# Patient Record
Sex: Female | Born: 1950 | Race: Black or African American | Hispanic: No | Smoking: Never smoker
Health system: Southern US, Community
[De-identification: ages and names within clinical notes are randomized; demographics above are authoritative.]

## PROBLEM LIST (undated history)

## (undated) DIAGNOSIS — E782 Mixed hyperlipidemia: Secondary | ICD-10-CM

## (undated) DIAGNOSIS — R011 Cardiac murmur, unspecified: Secondary | ICD-10-CM

## (undated) DIAGNOSIS — T7840XA Allergy, unspecified, initial encounter: Secondary | ICD-10-CM

## (undated) DIAGNOSIS — M810 Age-related osteoporosis without current pathological fracture: Secondary | ICD-10-CM

## (undated) DIAGNOSIS — I10 Essential (primary) hypertension: Secondary | ICD-10-CM

## (undated) DIAGNOSIS — D649 Anemia, unspecified: Secondary | ICD-10-CM

## (undated) DIAGNOSIS — R748 Abnormal levels of other serum enzymes: Secondary | ICD-10-CM

## (undated) DIAGNOSIS — M1990 Unspecified osteoarthritis, unspecified site: Secondary | ICD-10-CM

## (undated) DIAGNOSIS — R7303 Prediabetes: Secondary | ICD-10-CM

## (undated) DIAGNOSIS — E063 Autoimmune thyroiditis: Secondary | ICD-10-CM

## (undated) DIAGNOSIS — R801 Persistent proteinuria, unspecified: Secondary | ICD-10-CM

## (undated) DIAGNOSIS — M25561 Pain in right knee: Secondary | ICD-10-CM

## (undated) DIAGNOSIS — E039 Hypothyroidism, unspecified: Secondary | ICD-10-CM

## (undated) DIAGNOSIS — L309 Dermatitis, unspecified: Secondary | ICD-10-CM

## (undated) DIAGNOSIS — K219 Gastro-esophageal reflux disease without esophagitis: Secondary | ICD-10-CM

## (undated) DIAGNOSIS — R252 Cramp and spasm: Secondary | ICD-10-CM

## (undated) HISTORY — DX: Dermatitis, unspecified: L30.9

## (undated) HISTORY — DX: Unspecified osteoarthritis, unspecified site: M19.90

## (undated) HISTORY — DX: Abnormal levels of other serum enzymes: R74.8

## (undated) HISTORY — DX: Age-related osteoporosis without current pathological fracture: M81.0

## (undated) HISTORY — PX: EYE SURGERY: SHX253

## (undated) HISTORY — DX: Essential (primary) hypertension: I10

## (undated) HISTORY — DX: Cramp and spasm: R25.2

## (undated) HISTORY — DX: Allergy, unspecified, initial encounter: T78.40XA

---

## 2004-01-17 ENCOUNTER — Other Ambulatory Visit: Payer: Self-pay

## 2004-01-23 ENCOUNTER — Ambulatory Visit: Payer: Self-pay | Admitting: Orthopedic Surgery

## 2005-08-07 ENCOUNTER — Emergency Department: Payer: Self-pay | Admitting: Emergency Medicine

## 2005-10-22 ENCOUNTER — Encounter: Admission: RE | Admit: 2005-10-22 | Discharge: 2005-10-22 | Payer: Self-pay | Admitting: Family Medicine

## 2005-11-16 ENCOUNTER — Encounter: Payer: Self-pay | Admitting: Neurological Surgery

## 2005-12-16 ENCOUNTER — Encounter: Payer: Self-pay | Admitting: Neurological Surgery

## 2006-01-21 ENCOUNTER — Emergency Department: Payer: Self-pay | Admitting: Internal Medicine

## 2009-01-28 ENCOUNTER — Emergency Department: Payer: Self-pay | Admitting: Emergency Medicine

## 2009-10-06 ENCOUNTER — Ambulatory Visit: Payer: Self-pay

## 2011-02-16 HISTORY — PX: JOINT REPLACEMENT: SHX530

## 2011-07-20 ENCOUNTER — Emergency Department: Payer: Self-pay | Admitting: Emergency Medicine

## 2011-08-09 ENCOUNTER — Ambulatory Visit: Payer: Self-pay | Admitting: General Practice

## 2011-08-09 LAB — URINALYSIS, COMPLETE
Bacteria: NONE SEEN
Nitrite: NEGATIVE
Ph: 6 (ref 4.5–8.0)
Protein: NEGATIVE
RBC,UR: 1 /HPF (ref 0–5)
WBC UR: <1 /HPF (ref 0–5)

## 2011-08-09 LAB — BASIC METABOLIC PANEL
Anion Gap: 9 (ref 7–16)
Chloride: 100 mmol/L (ref 98–107)
Co2: 28 mmol/L (ref 21–32)
EGFR (African American): >60
EGFR (Non-African Amer.): >60
Glucose: 87 mg/dL (ref 65–99)
Sodium: 137 mmol/L (ref 136–145)

## 2011-08-09 LAB — CBC
HCT: 39 % (ref 35.0–47.0)
RBC: 4.36 10*6/uL (ref 3.80–5.20)
RDW: 13.4 % (ref 11.5–14.5)

## 2011-08-09 LAB — MRSA PCR SCREENING

## 2011-08-09 LAB — PROTIME-INR: INR: 0.9

## 2011-08-09 LAB — SEDIMENTATION RATE: Erythrocyte Sed Rate: 37 mm/hr — ABNORMAL HIGH (ref 0–30)

## 2011-08-10 LAB — URINE CULTURE

## 2011-08-25 ENCOUNTER — Inpatient Hospital Stay: Payer: Self-pay | Admitting: General Practice

## 2011-08-26 LAB — BASIC METABOLIC PANEL
BUN: 5 mg/dL — ABNORMAL LOW (ref 7–18)
Calcium, Total: 8 mg/dL — ABNORMAL LOW (ref 8.5–10.1)
EGFR (African American): >60
EGFR (Non-African Amer.): >60
Glucose: 125 mg/dL — ABNORMAL HIGH (ref 65–99)
Osmolality: 280 (ref 275–301)
Potassium: 2.9 mmol/L — ABNORMAL LOW (ref 3.5–5.1)
Sodium: 141 mmol/L (ref 136–145)

## 2011-08-26 LAB — PLATELET COUNT: Platelet: 376 x10 3/mm 3

## 2011-08-26 LAB — HEMOGLOBIN: HGB: 9.8 g/dL — ABNORMAL LOW

## 2011-08-27 ENCOUNTER — Encounter: Payer: Self-pay | Admitting: Internal Medicine

## 2011-08-27 LAB — BASIC METABOLIC PANEL
BUN: 5 mg/dL — ABNORMAL LOW (ref 7–18)
Calcium, Total: 8.2 mg/dL — ABNORMAL LOW (ref 8.5–10.1)
Chloride: 104 mmol/L (ref 98–107)
Co2: 30 mmol/L (ref 21–32)
Osmolality: 279 (ref 275–301)
Potassium: 3.4 mmol/L — ABNORMAL LOW (ref 3.5–5.1)

## 2011-08-28 LAB — BASIC METABOLIC PANEL
Calcium, Total: 8.2 mg/dL — ABNORMAL LOW (ref 8.5–10.1)
Co2: 32 mmol/L (ref 21–32)
EGFR (African American): >60
EGFR (Non-African Amer.): >60
Osmolality: 277 (ref 275–301)
Potassium: 3.8 mmol/L (ref 3.5–5.1)

## 2012-04-19 LAB — HM PAP SMEAR

## 2012-04-24 LAB — HM MAMMOGRAPHY

## 2012-05-25 ENCOUNTER — Ambulatory Visit: Payer: Self-pay

## 2012-11-23 ENCOUNTER — Emergency Department: Payer: Self-pay | Admitting: Emergency Medicine

## 2013-11-14 ENCOUNTER — Ambulatory Visit: Payer: Self-pay | Admitting: General Practice

## 2013-11-14 LAB — CBC
HCT: 39.7 % (ref 35.0–47.0)
HGB: 12.6 g/dL (ref 12.0–16.0)
MCH: 28.5 pg (ref 26.0–34.0)
MCHC: 31.6 g/dL — AB (ref 32.0–36.0)
MCV: 90 fL (ref 80–100)
PLATELETS: 519 10*3/uL — AB (ref 150–440)
RBC: 4.41 10*6/uL (ref 3.80–5.20)
RDW: 13.7 % (ref 11.5–14.5)
WBC: 4.6 10*3/uL (ref 3.6–11.0)

## 2013-11-14 LAB — URINALYSIS, COMPLETE
Bilirubin,UR: NEGATIVE
Glucose,UR: NEGATIVE mg/dL (ref 0–75)
Ketone: NEGATIVE
Nitrite: POSITIVE
PROTEIN: NEGATIVE
Ph: 7 (ref 4.5–8.0)
RBC,UR: 2 /HPF (ref 0–5)
SPECIFIC GRAVITY: 1.006 (ref 1.003–1.030)
Squamous Epithelial: 1

## 2013-11-14 LAB — BASIC METABOLIC PANEL
Anion Gap: 6 — ABNORMAL LOW (ref 7–16)
BUN: 13 mg/dL (ref 7–18)
CHLORIDE: 103 mmol/L (ref 98–107)
CO2: 31 mmol/L (ref 21–32)
CREATININE: 0.72 mg/dL (ref 0.60–1.30)
Calcium, Total: 9 mg/dL (ref 8.5–10.1)
EGFR (Non-African Amer.): >60
Glucose: 93 mg/dL (ref 65–99)
Osmolality: 279 (ref 275–301)
POTASSIUM: 3.6 mmol/L (ref 3.5–5.1)
SODIUM: 140 mmol/L (ref 136–145)

## 2013-11-14 LAB — SEDIMENTATION RATE: ERYTHROCYTE SED RATE: 32 mm/h — AB (ref 0–30)

## 2013-11-14 LAB — PROTIME-INR
INR: 1
PROTHROMBIN TIME: 12.7 s (ref 11.5–14.7)

## 2013-11-14 LAB — MRSA PCR SCREENING

## 2013-11-14 LAB — APTT: ACTIVATED PTT: 33.5 s (ref 23.6–35.9)

## 2013-11-16 LAB — URINE CULTURE

## 2013-11-28 ENCOUNTER — Inpatient Hospital Stay: Payer: Self-pay | Admitting: General Practice

## 2013-11-29 LAB — BASIC METABOLIC PANEL
Anion Gap: 5 — ABNORMAL LOW (ref 7–16)
BUN: 6 mg/dL — AB (ref 7–18)
CALCIUM: 8 mg/dL — AB (ref 8.5–10.1)
CHLORIDE: 105 mmol/L (ref 98–107)
CREATININE: 0.75 mg/dL (ref 0.60–1.30)
Co2: 30 mmol/L (ref 21–32)
EGFR (Non-African Amer.): >60
GLUCOSE: 102 mg/dL — AB (ref 65–99)
OSMOLALITY: 277 (ref 275–301)
Potassium: 3.9 mmol/L (ref 3.5–5.1)
Sodium: 140 mmol/L (ref 136–145)

## 2013-11-29 LAB — PLATELET COUNT: PLATELETS: 443 10*3/uL — AB (ref 150–440)

## 2013-11-29 LAB — HEMOGLOBIN: HGB: 10.3 g/dL — AB (ref 12.0–16.0)

## 2013-11-30 ENCOUNTER — Encounter: Payer: Self-pay | Admitting: Internal Medicine

## 2013-11-30 LAB — BASIC METABOLIC PANEL
Anion Gap: 6 — ABNORMAL LOW (ref 7–16)
Anion Gap: 7 (ref 7–16)
BUN: 4 mg/dL — AB (ref 7–18)
BUN: 5 mg/dL — AB (ref 7–18)
CALCIUM: 8 mg/dL — AB (ref 8.5–10.1)
CHLORIDE: 99 mmol/L (ref 98–107)
CO2: 30 mmol/L (ref 21–32)
CO2: 31 mmol/L (ref 21–32)
CREATININE: 0.59 mg/dL — AB (ref 0.60–1.30)
CREATININE: 0.69 mg/dL (ref 0.60–1.30)
Calcium, Total: 7.8 mg/dL — ABNORMAL LOW (ref 8.5–10.1)
Chloride: 99 mmol/L (ref 98–107)
EGFR (African American): >60
EGFR (African American): >60
Glucose: 106 mg/dL — ABNORMAL HIGH (ref 65–99)
Glucose: 129 mg/dL — ABNORMAL HIGH (ref 65–99)
OSMOLALITY: 273 (ref 275–301)
Osmolality: 267 (ref 275–301)
POTASSIUM: 2.9 mmol/L — AB (ref 3.5–5.1)
POTASSIUM: 2.9 mmol/L — AB (ref 3.5–5.1)
SODIUM: 135 mmol/L — AB (ref 136–145)
SODIUM: 137 mmol/L (ref 136–145)

## 2013-11-30 LAB — HEMOGLOBIN: HGB: 10.5 g/dL — AB (ref 12.0–16.0)

## 2013-11-30 LAB — PATHOLOGY REPORT

## 2013-11-30 LAB — PLATELET COUNT: Platelet: 445 10*3/uL — ABNORMAL HIGH (ref 150–440)

## 2013-12-01 LAB — BASIC METABOLIC PANEL
Anion Gap: 6 — ABNORMAL LOW (ref 7–16)
BUN: 8 mg/dL (ref 7–18)
CALCIUM: 7.8 mg/dL — AB (ref 8.5–10.1)
Chloride: 100 mmol/L (ref 98–107)
Co2: 31 mmol/L (ref 21–32)
Creatinine: 0.6 mg/dL (ref 0.60–1.30)
EGFR (African American): >60
EGFR (Non-African Amer.): >60
GLUCOSE: 94 mg/dL (ref 65–99)
OSMOLALITY: 272 (ref 275–301)
POTASSIUM: 4 mmol/L (ref 3.5–5.1)
SODIUM: 137 mmol/L (ref 136–145)

## 2013-12-16 ENCOUNTER — Encounter: Payer: Self-pay | Admitting: Internal Medicine

## 2013-12-20 ENCOUNTER — Emergency Department: Payer: Self-pay | Admitting: Internal Medicine

## 2013-12-20 LAB — CBC WITH DIFFERENTIAL/PLATELET
BASOS ABS: 0 10*3/uL (ref 0.0–0.1)
BASOS PCT: 0.2 %
Eosinophil #: 0 10*3/uL (ref 0.0–0.7)
Eosinophil %: 0.8 %
HCT: 35.4 % (ref 35.0–47.0)
HGB: 11.4 g/dL — AB (ref 12.0–16.0)
LYMPHS ABS: 1.5 10*3/uL (ref 1.0–3.6)
LYMPHS PCT: 25.1 %
MCH: 29 pg (ref 26.0–34.0)
MCHC: 32.2 g/dL (ref 32.0–36.0)
MCV: 90 fL (ref 80–100)
MONOS PCT: 9.1 %
Monocyte #: 0.5 x10 3/mm (ref 0.2–0.9)
Neutrophil #: 3.8 10*3/uL (ref 1.4–6.5)
Neutrophil %: 64.8 %
Platelet: 928 10*3/uL — ABNORMAL HIGH (ref 150–440)
RBC: 3.93 10*6/uL (ref 3.80–5.20)
RDW: 14.3 % (ref 11.5–14.5)
WBC: 5.9 10*3/uL (ref 3.6–11.0)

## 2013-12-20 LAB — URINALYSIS, COMPLETE
Bilirubin,UR: NEGATIVE
Blood: NEGATIVE
Glucose,UR: NEGATIVE mg/dL (ref 0–75)
KETONE: NEGATIVE
Nitrite: NEGATIVE
PROTEIN: NEGATIVE
Ph: 7 (ref 4.5–8.0)
RBC,UR: 2 /HPF (ref 0–5)
SPECIFIC GRAVITY: 1.003 (ref 1.003–1.030)

## 2013-12-20 LAB — COMPREHENSIVE METABOLIC PANEL
ANION GAP: 8 (ref 7–16)
Albumin: 3.4 g/dL (ref 3.4–5.0)
Alkaline Phosphatase: 370 U/L — ABNORMAL HIGH
BILIRUBIN TOTAL: 0.3 mg/dL (ref 0.2–1.0)
BUN: 8 mg/dL (ref 7–18)
CALCIUM: 9.4 mg/dL (ref 8.5–10.1)
CO2: 27 mmol/L (ref 21–32)
CREATININE: 0.72 mg/dL (ref 0.60–1.30)
Chloride: 102 mmol/L (ref 98–107)
EGFR (African American): >60
GLUCOSE: 126 mg/dL — AB (ref 65–99)
Osmolality: 274 (ref 275–301)
Potassium: 3.5 mmol/L (ref 3.5–5.1)
SGOT(AST): 33 U/L (ref 15–37)
SGPT (ALT): 20 U/L
Sodium: 137 mmol/L (ref 136–145)
Total Protein: 8.8 g/dL — ABNORMAL HIGH (ref 6.4–8.2)

## 2013-12-20 LAB — TROPONIN I

## 2014-01-15 ENCOUNTER — Encounter: Payer: Self-pay | Admitting: Internal Medicine

## 2014-02-15 ENCOUNTER — Encounter: Payer: Self-pay | Admitting: Internal Medicine

## 2014-06-08 NOTE — Op Note (Signed)
PATIENT NAME:  Carol [name], Carol [name] MR#:  [id number] DATE OF BIRTH:  [birth year]  DATE OF PROCEDURE:  11/28/2013  PREOPERATIVE DIAGNOSIS: Degenerative arthrosis of the left hip.   POSTOPERATIVE DIAGNOSIS: Degenerative arthrosis of the left hip.   PROCEDURE PERFORMED: Left total hip arthroplasty.   SURGEON: Francesco Sor, M.D.    ASSISTANT:  Van Clines, PA (required to maintain retraction throughout the procedure).   ANESTHESIA:  Spinal.   ESTIMATED BLOOD LOSS: 100 mL.   FLUIDS REPLACED: 1900 mL of crystalloid.   DRAINS: Two medium drains to Hemovac reservoir.   IMPLANTS UTILIZED:  DePuy 12-mm small-stature AML femoral stem, 52-mm outer diameter Pinnacle 100 acetabular shell,  +4-mm neutral Pinnacle Marathon polyethylene liner, and a 36-mm outer diameter M-SPEC femoral head with A +1.5-mm neck length.   INDICATIONS FOR SURGERY: The patient is a 63 year old female who has been seen for complaints of progressive left hip and groin pain as well as restricted range of motion of her left hip. X-rays demonstrated severe degenerative changes of the left hip. After discussion of the risks and benefits of surgical intervention, the patient expressed understanding of the risks and benefits, and agreed with plans for surgical intervention.   PROCEDURE IN DETAIL: The patient brought to the operating room and, after adequate spinal anesthesia was achieved, the patient was placed in a right lateral decubitus position. Axillary roll was placed, and all bony prominences were well padded. The patient's left hip and leg were cleaned and prepped with alcohol and DuraPrep and draped in the usual sterile fashion. A "timeout" was performed as per usual protocol.   A lateral curvilinear incision was made gently curving towards the posterior superior iliac spine. The IT band was incised in line with the skin incision, and fibers of the gluteus maximus were split in line. The piriformis tendon was identified, skeletonized,  incised at its insertion on the proximal femur and reflected posteriorly. In a similar fashion, short external rotators were incised and reflected posteriorly. A T-type posterior capsulotomy was performed.   Prominent posterior osteophytes were encountered. An osteotome was used to debride the posterior acetabular osteophytes. Prior to dislocation of the femoral head, a threaded Steinmann pin was inserted through a separate stab incision into the pelvis superior to the acetabulum and bent in the form of a stylus so as to assess limb length and hip offset throughout the procedure.   The femoral head was then dislocated posteriorly. Severe degenerative changes were noted with full-thickness loss of articular cartilage. Prominent osteophytes were noted about the femoral neck. These were debrided using a rongeur. The femoral neck cut was performed using an oscillating saw. The anterior capsule was elevated off of the femoral neck. Inspection of the acetabulum also demonstrated severe degenerative changes. Prominent periacetabular osteophytes were noted and these were debrided using osteotomes and a rongeur.   The acetabulum was reamed in a sequential fashion up to a 51-mm diameter. This allowed for good punctate bleeding bone. A 52-mm outer diameter Pinnacle 100 acetabular shell was positioned and impacted into place. Excellent scratch fit was appreciated. A +4 neutral polyethylene trial was inserted and attention was directed to the proximal femur. Pilot hole for reaming of the proximal femoral canal was created using a high-speed bur. Proximal femoral canal was reamed in a sequential fashion up to an 11.5-mm diameter. This allowed for approximately 6 cm of scratch fit. The proximal femur was prepared using a 12-mm aggressive side-biting reamer. Serial broaches were inserted up to a 12-mm small-stature  broach. The calcar region was planed and trial reduction was performed with a 36-mm hip ball with a +1.5-mm neck  length. This allowed for good equalization of limb length and excellent stability both anteriorly and posteriorly. Good hip offset was appreciated.   Trial components were removed. Acetabular shell was irrigated with copious amounts of normal saline with antibiotic solution and then suctioned dry. A +4-mm neutral Pinnacle Marathon polyethylene liner was positioned and impacted into place. Next, a 12-mm small-stature AML femoral component was positioned and impacted into place. Excellent scratch fit was appreciated. Trial reduction was again performed with a 36-mm hip ball with a +12.5-mm neck length. Again, excellent stability was noted both anteriorly and posteriorly and good equalization of limb lengths was appreciated.   Trial hip ball was removed. The Morse taper was cleaned and dried and a 36-mm M-SPEC femoral head with a +1.5-mm neck length was placed on the trunnion and impacted into place. The hip was reduced and again placed through a range of motion with excellent stability appreciated and good equalization of limb lengths.   The wound was irrigated with copious amounts of normal saline with antibiotic solution using pulsatile lavage and then suctioned dry. Good hemostasis was appreciated. The posterior capsulotomy was repaired using #5 Ethibond. The gluteal sling which had been released previously was reapproximated using interrupted sutures of #5 Ethibond. The piriformis tendon was reapproximated on the undersurface of the gluteus medius tendon using #5 Ethibond. Two medium drains were placed in the wound bed and brought out through a separate stab incision to be attached to a reinfusion system. The IT band was repaired using interrupted sutures of #1 Vicryl. The subcutaneous tissue was approximated in layers using first #0 Vicryl followed by 2-0 Vicryl. The skin was closed with skin staples. A sterile dressing was applied. The patient tolerated the procedure well. She was transported to the  recovery room in stable condition.    ____________________________ Illene LabradorJames P. Angie FavaHooten Jr., MD jph:lt D: 11/29/2013 06:22:05 ET T: 11/29/2013 12:22:40 ET JOB#: [id number]  cc: Illene LabradorJames P. Angie FavaHooten Jr., MD, <Dictator> JAMES P Angie FavaHOOTEN JR MD ELECTRONICALLY SIGNED 11/29/2013 23:02

## 2014-06-08 NOTE — Discharge Summary (Signed)
PATIENT NAME:  Carol [name], Carol [name] MR#:  [id number] DATE OF BIRTH:  1950-11-28  DATE OF ADMISSION:  11/28/2013 DATE OF DISCHARGE:  12/01/2013   DICTATED FOR: Illene LabradorJames P. Angie FavaHooten Jr., MD  ADMITTING DIAGNOSIS: Degenerative arthrosis of the left hip.   DISCHARGE DIAGNOSIS: Degenerative arthrosis of the left hip.   HISTORY: The patient is a 64 year old who has been [facility] at [facility] for progression of left hip discomfort. The patient states THAT she has seen significant increase in her pain to both the left hip and knee over the last 2 months, without any apparent trauma or aggravating event. She had gone to using a cane because of the increased discomfort. She had reported that her pain was aggravated with weight-bearing activities. She also reported some decrease in her range of motion. She was having difficulty with doffing and donning of her shoes and socks. The pain had increased to the point that it was significantly interfering with her activities of daily living.   X-rays taken in [facility] showed a significant loss of cartilage space to the left hip with bone-on-bone articulation being noted. There was significant osteophyte formation as well as subchondral sclerosis and subchondral cyst formation noted. After discussion of the risks and benefits of surgical intervention, the patient expressed her understanding of the risks and benefits and agreed for plans for surgical intervention.   PROCEDURE PERFORMED: Left total hip arthroplasty.   ANESTHESIA: Spinal.   IMPLANTS UTILIZED: DePuy 12 mm, small stature, AML femoral stem; 52 mm outer diameter Pinnacle 100 acetabular component shell; +4 mm neutral Pinnacle Marathon polyethylene liner, and a 36 mm outer diameter M-Spec femoral head with a +1.5 mm neck length.   HOSPITAL COURSE: The patient tolerated the procedure very well. She had no complications. She was then taken to the PACU where she was stabilized, and then transferred to the  orthopedic floor. She began receiving anticoagulation therapy of Lovenox 30 mg subcutaneous every 12 hours per anesthesia and pharmacy protocol. She was fitted with TED stockings bilaterally.  These were allowed to be removed 1 hour per 8-hour shift. She was also fitted with ABI compression flip-flops set at 80 mmHg. Her calves have been nontender. There has been no evidence of DVTs. Negative Homans sign. Heels were elevated off the bed using rolled towels.   The patient has denied any chest pain or shortness of breath. Vital signs have been stable. She has been afebrile. Hemodynamically she was stable. No transfusions were given.   Occupational therapy was also initiated on day 1 for ADL and assistive devices. She has done very well, but has been progressing very slow.  Physical therapy was also initiated on day 1 for ADL and assistive devices.   The patient's IV, Foley and Hemovac were discontinued on day 2, along with the dressing changes. The wound was free draining with no signs of infection.   DISPOSITION: The patient is being discharged to rehab facility in improved stable condition.  She may weight bear as tolerated. The posterior hip precautions were gone over once again. She is to elevate the lower extremity. Continue TED stockings; these may be removed 1 hour per 8-hour shift. Elevate the heels off the bed. Incentive spirometer every 1 hour while awake. Encourage cough, deep breathing every 2 hours while awake. She is placed on a regular diet.  She was instructed in wound care. She is not to get the wound wet until the staples are removed on 12/12/2013. She has a follow-up appointment  in the [facility] on 01/08/2014 at 9:45. She will call sooner if any complications.   DRUG ALLERGIES: No known drug allergies.   MEDICATIONS: Norvasc 5 mg q. a.m., Senokot-S 1 tablet [name].i.d., Flonase nasal spray 2 sprays to both nostrils p.r.n., Claritin 10 mg daily, pantoprazole 40 mg [name].i.d., Lovenox 30 mg  subcutaneous q. 12 hours for 14 days then discontinue and begin taking one 81 mg enteric-coated aspirin unless contraindicated,  Tylenol ES [phone] mg hours q. 4 hours p.r.n. for fevers, Mylanta DS 30 mL q. 6 hours, Dulcolax suppository 10 mg rectally daily p.r.n. for constipation  with no results from Milk of Magnesia, Milk of Magnesia 30 mL [name].i.d. p.r.n., Roxicodone 5-10 mg q. 4-6 hours p.r.n. for pain, tramadol 50-100 mg q. 4 hours for pain, enema soapsuds if no results with Milk of Magnesia or Dulcolax.   PAST MEDICAL HISTORY:  Hypertension and arthritis.      ____________________________ Van Clines, PA jrw:MT D: 11/30/2013 08:08:50 ET T: 11/30/2013 08:24:16 ET JOB#: [id number]  cc: Van Clines, PA, <Dictator> Josephus Harriger PA ELECTRONICALLY SIGNED 12/04/2013 12:37

## 2014-06-09 NOTE — Discharge Summary (Signed)
PATIENT NAME:  Carol [name], Carol [name] MR#:  [id number] DATE OF BIRTH:  August 22, 1950  DATE OF ADMISSION:  08/25/2011 DATE OF DISCHARGE:  08/28/2011  ADMITTING DIAGNOSIS: Degenerative arthrosis of the right hip.   DISCHARGE DIAGNOSES:  1. Degenerative arthrosis of the right hip.  2. Hypokalemia.   HISTORY: The patient is a 64 year old who has been followed at [facility] for progression of right hip pain. She had reported a several year history of progressive bilateral hip pain with the right being more symptomatic than the left. Her pain with relative, constant in nature, but was aggravated with weight-bearing activities as well as arising from a sitting position. At the time of surgery, she was not using any ambulatory aid. The patient had not seen any significant improvement despite the use of anti-inflammatory medications. The patient had noted progressive decrease in her range of motion of the hip. Her hip pain had progressed to the point that it was significantly interfering with her activities of daily living. [facility] in [facility] showed severe degenerative changes to both hips. She was noted to have subchondral sclerosis as well as osteophyte formation. Good mineralization of bone was noted. After discussion of the risks and benefits of surgical intervention, the patient expressed her understanding of the risks and benefits and agreed for plans for surgical intervention.   PROCEDURE: Right total hip arthroplasty.   ANESTHESIA: Spinal.   IMPLANTS UTILIZED: DePuy 13.5 mm small stature Prodigy femoral component, 52 mm outer diameter Pinnacle 100 acetabular component, a +4 mm neutral Pinnacle Marathon polyethylene liner, and a 36 mm M-Spec femoral head with a +1.5 mm neck length.   HOSPITAL COURSE: The patient tolerated the procedure very well. She had no complications. She was then taken to the PAC-U where she was stabilized and then transferred to the orthopedic  floor. She was fitted with TED stockings bilaterally. These were allowed to be removed one hour per eight hour shift. The patient was also fitted with the AV-I compression foot pumps bilaterally set at 80 mmHg. Her calves have been nontender. There has been no evidence of any deep venous thromboses of the lower extremity. Negative Homans sign. The heels were elevated off the bed using rolled towels.   The patient denied any chest pains or shortness of breath. Vital signs have been stable. She has been afebrile. Hemodynamically, she was stable. No transfusions were given. Her potassium, however, did drop to 2.9 on day one following surgery and subsequently this was supplemented with potassium. Upon being discharged, this was within normal limits.   The patient began receiving physical therapy on day one for gait training and transfers. This has been a little bit on the slow side for ambulation. This appears to be a little bit of a fear factor. The patient also began receiving occupational therapy for ADL and assistive devices.   The patient's IV, Foley and Hemovac were all discontinued on day two along with a dressing change. The wound was free of any drainage or signs of infection.   The patient is being discharged to a skilled nursing facility in improved stable condition. The patient will continue with physical therapy for gait training and transfers, occupational therapy for activities of daily living and assistive devices.   DISCHARGE INSTRUCTIONS:  1. Elevate heels off the bed.  2. Abduction pillow between legs when in bed.  3. Weight bear as tolerated.  4. TED stockings are to be worn around-the-clock but may be removed one hour  per eight hour shift.  5. Incentive spirometer q.1 hour while awake.  6. Encourage cough, deep breathing every two hours while awake.  7. She is placed on a regular diet.  8. She has a follow-up appointment on 08/22 at 10:45. She is to call the clinic sooner if any  temperatures of 101.5 or greater or excessive bleeding.   DRUG ALLERGIES: No known drug allergies.   MEDICATIONS:  1. Roxicodone 5 to 10 mg every 4 to 6 hours p.r.n. for pain. 2. Tramadol 100 mg every four hours p.r.n. for pain.  3. Senokot-S 1 tablet [name].i.d.  4. Milk of magnesia 30 mL [name].i.d.  5. Dulcolax suppositories 10 mg rectally daily p.r.n. for constipation.  6. Enema soapsuds if no results with milk of magnesia or Dulcolax.  7. Mylanta DS 30 mL every six hours p.r.n.  8. Pantoprazole 40 mg [name].i.d.  9. Flonase nasal spray two sprays both nostrils daily. 10. Claritin 10 mg daily. 11. Norvasc 5 mg daily.  12. Lovenox 30 mg subcutaneous q.12 hours for 14 days, then discontinue and begin taking one 81 mg enteric-coated aspirin.   PAST MEDICAL HISTORY:  1. Hypertension.  2. Arthritis.   ____________________________ Van Clines, PA jrw:ap D: 08/27/2011 08:12:08 ET T: 08/27/2011 08:49:43 ET JOB#: [id number]  cc: Van Clines, PA, <Dictator> Leann Mayweather PA ELECTRONICALLY SIGNED 08/29/2011 20:32

## 2014-06-09 NOTE — Op Note (Signed)
PATIENT NAME:  Carol [name], Carol [name] MR#:  [id number] DATE OF BIRTH:  03/17/1950  DATE OF PROCEDURE:  08/25/2011  PREOPERATIVE DIAGNOSIS: Degenerative arthrosis of the right hip.   POSTOPERATIVE DIAGNOSIS: Degenerative arthrosis of the right hip.   PROCEDURE PERFORMED: Right total hip arthroplasty.   SURGEON: Carol Labrador. Angie Fava., [name]    ASSISTANT: Van Clines, PA-C (required to maintain retraction throughout the procedure)   ANESTHESIA: Spinal.   ESTIMATED BLOOD LOSS: 250 mL.   FLUIDS REPLACED: 2100 mL of Crystalloid.   DRAINS: Two medium drains to Hemovac reservoir.   IMPLANTS UTILIZED: DePuy 13.5 mm small stature Prodigy femoral component, 52 mm outer diameter Pinnacle 100 acetabular component, a +4 mm neutral Pinnacle Marathon polyethylene liner, and a 36 mm M-Spec femoral head with a +1.5 mm neck length.   INDICATIONS FOR SURGERY: The patient is a 64 year old female who has been seen for complaints of progressive right hip and groin pain as well as restricted range of motion of the hip. X-rays demonstrated severe degenerative changes with bone on bone articulation appreciated especially superiorly. After discussion of the risks and benefits of surgical intervention, the patient expressed her understanding of the risks and benefits and agreed with plans for surgical intervention.   PROCEDURE IN DETAIL: The patient was brought to the operating room and, after adequate spinal anesthesia was achieved, the patient was placed in left lateral decubitus position. Axillary roll was placed and all bony prominences were well padded. The patient's right hip and leg were cleaned and prepped with alcohol and DuraPrep and draped in the usual sterile fashion. A "time-out" was performed as per usual protocol. A lateral curvilinear incision was made gently curving towards the posterior superior iliac spine. IT band was incised in line with the skin incision and the fibers of the gluteus maximus were split in  line. Piriformis tendon was identified, skeletonized, and incised at its insertion and the proximal femur and reflected posteriorly. The short external rotators were incised and reflected posteriorly. A T-type posterior capsulotomy was performed. Prior to dislocation of the femoral head, a threaded Steinmann pin was inserted through a separate stab incision into the pelvis superior to the acetabulum and bent in the form of a stylus so as to assess limb length and hip offset throughout the procedure. Femoral head was then dislocated posteriorly. Severe degenerative changes were noted with full thickness loss of articular cartilage and prominent osteophyte formation about the femoral neck. Femoral neck cut was performed using an oscillating saw. Anterior capsule was elevated off of the femoral neck. Inspection of the acetabulum also demonstrated severe degenerative changes with near circumferential osteophyte formation. Remnant of the labrum was excised. The acetabulum was reamed in a sequential fashion up to a 51 mm diameter. Good punctate bleeding bone was encountered. A 52 mm Pinnacle 100 acetabular component was positioned and impacted into place. Excellent scratch fit was appreciated. A +4 mm neutral polyethylene trial was inserted. Prominent margins of the acetabular osteophytes were debrided using a rongeur.   Attention was then directed to the proximal femur. Pilot hole for reaming of the proximal femoral canal was created using high-speed bur. Proximal femoral canal was reamed in a sequential fashion up to a 13 mm diameter. This allowed for approximately 6 cm of scratch fit. The proximal femur was then prepared using a 13.5 mm aggressive side-biting reamer. Serial broaches were inserted up to a 13.5 mm small stature broach. Calcar region was planed and trial reduction was performed using first an  AML neck length with +1.5 mm length. Good equalization of limb lengths was appreciated. However, there appeared  to be some relative increase in the hip offset. The AML neck segment was then replaced with a Prodigy neck segment with the 1.5 mm neck length. This allowed for good equalization of limb lengths and more appropriate hip offset. Excellent stability was appreciated both anteriorly and posteriorly. Trial components were removed. The acetabular shell was irrigated with normal saline with antibiotic solution and then suctioned dry. A +4 mm neutral Pinnacle Marathon polyethylene liner was positioned and impacted into place. Next, a 13.5 mm small stature Prodigy femoral component was positioned and impacted into place. Excellent scratch fit was appreciated. Trial reduction was again performed with a 36 mm hip ball with a +1.5 mm neck length. Again, good equalization of limb lengths was appreciated and appropriate hip offset was noted. Excellent stability was appreciated both anteriorly and posteriorly. Trial hip ball was removed. The Morse taper was cleaned and dried. A 36 mm M-Spec femoral head with a +1.5 mm neck length was placed on the trunnion and impacted into place. The hip was reduced and placed through a range of motion. Again, excellent anterior and posterior stability was appreciated with good equalization of limb lengths and appropriate restoration of hip offset noted.   The wound was irrigated with copious amounts of normal saline with antibiotic solution using pulsatile lavage and then suctioned dry. Good hemostasis was appreciated. The posterior capsulotomy was repaired using #5 Ethibond. Piriformis tendon was reapproximated on the undersurface of the gluteus medius tendon using #5 Ethibond. Two medium drains were placed in the wound bed and brought out through a separate stab incision to be attached to Hemovac reservoir. IT band was repaired using interrupted sutures of #1 Vicryl. Subcutaneous tissue was approximated in layers using first #0 Vicryl followed by #2-0 Vicryl. Skin was closed with skin  staples. Sterile dressing was applied.   The patient tolerated the procedure well. She was transported to the recovery room in stable condition.   ____________________________ Carol LabradorJames P. Angie FavaHooten Jr., [name] jph:drc D: 08/26/2011 06:21:00 ET T: 08/26/2011 09:33:48 ET JOB#: [id number]  cc: Carol LabradorJames P. Angie FavaHooten Jr., [name], <Dictator> Carol [name] ELECTRONICALLY SIGNED 08/28/2011 15:30

## 2014-08-23 ENCOUNTER — Other Ambulatory Visit: Payer: Self-pay | Admitting: Unknown Physician Specialty

## 2014-09-27 ENCOUNTER — Other Ambulatory Visit: Payer: Self-pay | Admitting: Family Medicine

## 2014-09-27 NOTE — Telephone Encounter (Signed)
Routine to primary provider 

## 2014-12-02 DIAGNOSIS — Z96649 Presence of unspecified artificial hip joint: Secondary | ICD-10-CM | POA: Insufficient documentation

## 2014-12-03 ENCOUNTER — Telehealth: Payer: Self-pay

## 2014-12-03 NOTE — Telephone Encounter (Signed)
Called patient and asked her if she's had an eye exam and her colorectal screening. Patient told me that she knew she needed an eye exam and she will schedule one soon. She also told me she completed the Cologuard screening and we should get the results soon.

## 2014-12-18 DIAGNOSIS — M810 Age-related osteoporosis without current pathological fracture: Secondary | ICD-10-CM

## 2014-12-18 HISTORY — DX: Age-related osteoporosis without current pathological fracture: M81.0

## 2015-01-13 DIAGNOSIS — M169 Osteoarthritis of hip, unspecified: Secondary | ICD-10-CM | POA: Insufficient documentation

## 2015-01-13 DIAGNOSIS — M810 Age-related osteoporosis without current pathological fracture: Secondary | ICD-10-CM | POA: Insufficient documentation

## 2015-01-13 DIAGNOSIS — L309 Dermatitis, unspecified: Secondary | ICD-10-CM

## 2015-01-13 DIAGNOSIS — R252 Cramp and spasm: Secondary | ICD-10-CM

## 2015-01-13 DIAGNOSIS — M1990 Unspecified osteoarthritis, unspecified site: Secondary | ICD-10-CM

## 2015-01-13 DIAGNOSIS — I10 Essential (primary) hypertension: Secondary | ICD-10-CM

## 2015-01-13 DIAGNOSIS — J309 Allergic rhinitis, unspecified: Secondary | ICD-10-CM | POA: Insufficient documentation

## 2015-01-13 DIAGNOSIS — R748 Abnormal levels of other serum enzymes: Secondary | ICD-10-CM | POA: Insufficient documentation

## 2015-01-27 ENCOUNTER — Ambulatory Visit (INDEPENDENT_AMBULATORY_CARE_PROVIDER_SITE_OTHER): Payer: Medicare Other | Admitting: Unknown Physician Specialty

## 2015-01-27 ENCOUNTER — Encounter: Payer: Self-pay | Admitting: Unknown Physician Specialty

## 2015-01-27 VITALS — BP 136/81 | HR 86 | Temp 98.3°F | Ht 63.1 in | Wt 160.0 lb

## 2015-01-27 DIAGNOSIS — I10 Essential (primary) hypertension: Secondary | ICD-10-CM

## 2015-01-27 DIAGNOSIS — Z0000 Encounter for general adult medical examination without abnormal findings: Secondary | ICD-10-CM

## 2015-01-27 DIAGNOSIS — Z23 Encounter for immunization: Secondary | ICD-10-CM | POA: Diagnosis not present

## 2015-01-27 LAB — MICROALBUMIN, URINE WAIVED
CREATININE, URINE WAIVED: 100 mg/dL (ref 10–300)
MICROALB, UR WAIVED: 30 mg/L — AB (ref 0–19)

## 2015-01-27 MED ORDER — AMLODIPINE BESYLATE 5 MG PO TABS: 5.0000 mg | ORAL_TABLET | Freq: Every day | ORAL | Status: DC

## 2015-01-27 NOTE — Patient Instructions (Signed)
Please do call to schedule your mammogram; the number to schedule one at either [facility] or [facility] is [phone]   

## 2015-01-27 NOTE — Assessment & Plan Note (Signed)
Stable, continue present medications.   

## 2015-01-27 NOTE — Progress Notes (Signed)
BP 136/81 mmHg  Pulse 86  Temp(Src) 98.3 F (36.8 C)  Ht 5' 3.1" (1.603 m)  Wt 160 lb (72.576 kg)  BMI 28.24 kg/m2  SpO2 97%  LMP  (LMP Unknown)   Subjective:    Patient ID: Carol [name], female    DOB: 06/20/1950, 64 y.o.   MRN: [id number]  HPI: Carol [name] is a 64 y.o. female  Chief Complaint  Patient presents with  . Medicare Wellness  . Cough    pt states she has a cough that has been going on for about a week now   Functional Status Survey: Is the patient deaf or have difficulty hearing?: No Does the patient have difficulty seeing, even when wearing glasses/contacts?: No Does the patient have difficulty concentrating, remembering, or making decisions?: No Does the patient have difficulty walking or climbing stairs?: No Does the patient have difficulty dressing or bathing?: No Does the patient have difficulty doing errands alone such as visiting a doctor's office or shopping?: No  Depression screen PHQ 2/9 01/27/2015  Decreased Interest 0  Down, Depressed, Hopeless 0  PHQ - 2 Score 0    Fall Risk  01/27/2015  Falls in the past year? No   Hypertension Using medications without difficulty Average home BP: Not checking  No problems or lightheadedness No chest pain with exertion or shortness of breath No Edema  Relevant past medical, surgical, family and social history reviewed and updated as indicated. Interim medical history since our last visit reviewed. Allergies and medications reviewed and updated.  See functional status, depression screen, and fall's risk assessment  under the appropriate section.    Pt is able to perform complex mental tasks, recognize clock face, recognize time and do a 3 item recall.    Review of Systems  Constitutional: Negative.   HENT: Negative.   Eyes: Negative.   Respiratory: Positive for cough.   Cardiovascular: Negative.   Gastrointestinal: Negative.   Endocrine: Negative.   Genitourinary: Negative.    Musculoskeletal: Negative.   Skin: Negative.   Allergic/Immunologic: Negative.   Neurological: Negative.   Hematological: Negative.   Psychiatric/Behavioral: Negative.     Per HPI unless specifically indicated above     Objective:    BP 136/81 mmHg  Pulse 86  Temp(Src) 98.3 F (36.8 C)  Ht 5' 3.1" (1.603 m)  Wt 160 lb (72.576 kg)  BMI 28.24 kg/m2  SpO2 97%  LMP  (LMP Unknown)  Wt Readings from Last 3 Encounters:  01/27/15 160 lb (72.576 kg)  07/16/14 157 lb (71.215 kg)    Physical Exam  Constitutional: She is oriented to person, place, and time. She appears well-developed and well-nourished.  HENT:  Head: Normocephalic and atraumatic.  Eyes: Pupils are equal, round, and reactive to light. Right eye exhibits no discharge. Left eye exhibits no discharge. No scleral icterus.  Neck: Normal range of motion. Neck supple. Carotid bruit is not present. No thyromegaly present.  Cardiovascular: Normal rate, regular rhythm and normal heart sounds.  Exam reveals no gallop and no friction rub.   No murmur heard. Pulmonary/Chest: Effort normal and breath sounds normal. No respiratory distress. She has no wheezes. She has no rales.  Abdominal: Soft. Bowel sounds are normal. There is no tenderness. There is no rebound.  Genitourinary: No breast swelling, tenderness or discharge.  Musculoskeletal: Normal range of motion.  Lymphadenopathy:    She has no cervical adenopathy.  Neurological: She is alert and oriented to person, place, and time.  Skin: Skin is warm, dry and intact. No rash noted.  Psychiatric: She has a normal mood and affect. Her speech is normal and behavior is normal. Judgment and thought content normal. Cognition and memory are normal.      Assessment & Plan:   Problem List Items Addressed This Visit      Unprioritized   Hypertension    Stable, continue present medications.       Relevant Medications   amLODipine (NORVASC) 5 MG tablet   Other Relevant Orders    Comprehensive metabolic panel   Microalbumin, Urine Waived   Lipid Panel w/o Chol/HDL Ratio    Other Visit Diagnoses    Annual physical exam    -  Primary    Relevant Orders    HIV antibody    Hepatitis C antibody    MM DIGITAL SCREENING BILATERAL    Immunization due        Relevant Orders    Flu Vaccine QUAD 36+ mos IM        Follow up plan: Return in about 6 months (around 07/28/2015).

## 2015-01-28 ENCOUNTER — Encounter: Payer: Self-pay | Admitting: Unknown Physician Specialty

## 2015-01-28 LAB — COMPREHENSIVE METABOLIC PANEL
ALBUMIN: 4 g/dL (ref 3.6–4.8)
ALT: 12 IU/L (ref 0–32)
AST: 15 IU/L (ref 0–40)
Albumin/Globulin Ratio: 1.1 (ref 1.1–2.5)
Alkaline Phosphatase: 157 IU/L — ABNORMAL HIGH (ref 39–117)
BUN / CREAT RATIO: 14 (ref 11–26)
BUN: 11 mg/dL (ref 8–27)
Bilirubin Total: 0.3 mg/dL (ref 0.0–1.2)
CO2: 24 mmol/L (ref 18–29)
CREATININE: 0.8 mg/dL (ref 0.57–1.00)
Calcium: 9.3 mg/dL (ref 8.7–10.3)
Chloride: 99 mmol/L (ref 96–106)
GFR calc non Af Amer: 79 mL/min/{1.73_m2} (ref >59)
GFR, EST AFRICAN AMERICAN: 91 mL/min/{1.73_m2} (ref >59)
GLUCOSE: 88 mg/dL (ref 65–99)
Globulin, Total: 3.8 g/dL (ref 1.5–4.5)
Potassium: 4 mmol/L (ref 3.5–5.2)
Sodium: 139 mmol/L (ref 134–144)
TOTAL PROTEIN: 7.8 g/dL (ref 6.0–8.5)

## 2015-01-28 LAB — LIPID PANEL W/O CHOL/HDL RATIO
CHOLESTEROL TOTAL: 192 mg/dL (ref 100–199)
HDL: 37 mg/dL — ABNORMAL LOW (ref >39)
LDL CALC: 118 mg/dL — AB (ref 0–99)
Triglycerides: 184 mg/dL — ABNORMAL HIGH (ref 0–149)
VLDL CHOLESTEROL CAL: 37 mg/dL (ref 5–40)

## 2015-01-28 LAB — HIV ANTIBODY (ROUTINE TESTING W REFLEX): HIV Screen 4th Generation wRfx: NONREACTIVE

## 2015-01-28 LAB — HEPATITIS C ANTIBODY: Hep C Virus Ab: <0.1 s/co ratio (ref 0.0–0.9)

## 2015-01-28 NOTE — Progress Notes (Signed)
Quick Note:  Normal labs. Patient notified by letter. ______ 

## 2015-01-31 ENCOUNTER — Ambulatory Visit
Admission: RE | Admit: 2015-01-31 | Discharge: 2015-01-31 | Disposition: A | Discharge status: Home / Self Care | Payer: Medicare Other | Source: Ambulatory Visit | Attending: Unknown Physician Specialty | Admitting: Unknown Physician Specialty

## 2015-01-31 ENCOUNTER — Other Ambulatory Visit: Payer: Self-pay | Admitting: Unknown Physician Specialty

## 2015-01-31 DIAGNOSIS — Z0000 Encounter for general adult medical examination without abnormal findings: Secondary | ICD-10-CM

## 2015-01-31 DIAGNOSIS — Z1231 Encounter for screening mammogram for malignant neoplasm of breast: Secondary | ICD-10-CM | POA: Diagnosis not present

## 2015-02-11 ENCOUNTER — Ambulatory Visit (INDEPENDENT_AMBULATORY_CARE_PROVIDER_SITE_OTHER): Payer: Medicare Other | Admitting: Family Medicine

## 2015-02-11 ENCOUNTER — Ambulatory Visit: Payer: Medicare Other | Admitting: Family Medicine

## 2015-02-11 VITALS — BP 126/77 | HR 92 | Temp 97.6°F | Resp 16 | Ht 63.5 in | Wt 161.0 lb

## 2015-02-11 DIAGNOSIS — J069 Acute upper respiratory infection, unspecified: Secondary | ICD-10-CM

## 2015-02-11 DIAGNOSIS — J039 Acute tonsillitis, unspecified: Secondary | ICD-10-CM | POA: Diagnosis not present

## 2015-02-11 LAB — POCT RAPID STREP A (OFFICE): Rapid Strep A Screen: NEGATIVE

## 2015-02-11 MED ORDER — BENZONATATE 100 MG PO CAPS: 100.0000 mg | ORAL_CAPSULE | Freq: Three times a day (TID) | ORAL | Status: DC | PRN

## 2015-02-11 MED ORDER — PENICILLIN V POTASSIUM 250 MG/5ML PO SOLR: 500.0000 mg | Freq: Three times a day (TID) | ORAL | Status: AC

## 2015-02-11 MED ORDER — OXYMETAZOLINE HCL 0.05 % NA SOLN: 1.0000 | Freq: Two times a day (BID) | NASAL | Status: AC

## 2015-02-11 MED ORDER — DEXTROMETHORPHAN POLISTIREX ER 30 MG/5ML PO SUER: 30.0000 mg | Freq: Two times a day (BID) | ORAL | Status: DC

## 2015-02-11 NOTE — Patient Instructions (Signed)
You can use supportive care at home to help with your symptoms. You can takes this twice daily.  I have also sent tesslon perles to your pharmacy to help with the cough- you can take these 3 times daily as needed. Honey is a natural cough suppressant- so add it to your tea in the morning.  If you have a humidifer, set that up in your bedroom at night.   Take your antibiotic 3 times per day for 10 days.  Salt water gargles for sore throat.   You can get Delsym syrup OTC to help with your cough.

## 2015-02-11 NOTE — Progress Notes (Signed)
Subjective:    Patient ID: Carol [name], female    DOB: 08-Aug-1950, 64 y.o.   MRN: [id number]  HPI: Carol [name] is a 64 y.o. female presenting on 02/11/2015 for Cough   Cough This is a new problem. The current episode started in the past 7 days. The cough is non-productive. Associated symptoms include nasal congestion and a sore throat. Pertinent negatives include no chest pain, chills, ear pain, fever, headaches, shortness of breath or wheezing. She has tried OTC cough suppressant for the symptoms. The treatment provided mild relief.    Pt is a [facility] patient being seen for an acute care visit today. Cough, sore throat- starting 2 days ago. No fevers. Headache at home. No ear pain, facial pressure. No chest pain or tightness, SOB.  No sick contacts.  Home treatment: Took robitussin DM.   Past Medical History  Diagnosis Date  . Muscle cramps   . Osteoporosis   . Hypertension   . Osteoarthritis   . Allergy   . Alkaline phosphatase elevation   . Eczema    Social History   Social History  . Marital Status: Married    Spouse Name: N/A  . Number of Children: N/A  . Years of Education: N/A   Occupational History  . Not on file.   Social History Main Topics  . Smoking status: Never Smoker   . Smokeless tobacco: Never Used  . Alcohol Use: No  . Drug Use: No  . Sexual Activity: Yes   Other Topics Concern  . Not on file   Social History Narrative   Family History  Problem Relation Age of Onset  . Cancer Mother   . CAD Mother   . Hypertension Mother   . Cancer Father     throat and lung  . Breast cancer Neg Hx    Current Outpatient Prescriptions on File Prior to Visit  Medication Sig  . amLODipine (NORVASC) 5 MG tablet Take 1 tablet (5 mg total) by mouth daily.  [name] aspirin 81 MG tablet Take 81 mg by mouth daily.  . fluticasone (FLONASE) 50 MCG/ACT nasal spray Place 2 sprays into both nostrils daily.  [name] loratadine (CLARITIN) 10 MG tablet Take 10 mg by  mouth daily.   No current facility-administered medications on file prior to visit.    Review of Systems  Constitutional: Negative for fever and chills.  HENT: Positive for congestion, sinus pressure and sore throat. Negative for ear pain and sneezing.   Respiratory: Positive for cough. Negative for chest tightness, shortness of breath and wheezing.   Cardiovascular: Negative for chest pain, palpitations and leg swelling.  Gastrointestinal: Negative.  Negative for nausea, vomiting, abdominal pain, diarrhea and constipation.  Endocrine: Negative.  Negative for cold intolerance, heat intolerance, polydipsia, polyphagia and polyuria.  Genitourinary: Negative for dysuria and difficulty urinating.  Musculoskeletal: Negative.  Negative for neck pain and neck stiffness.  Neurological: Negative for dizziness, light-headedness, numbness and headaches.  Psychiatric/Behavioral: Negative.    Per HPI unless specifically indicated above     Objective:    BP 126/77 mmHg  Pulse 92  Temp(Src) 97.6 F (36.4 C) (Oral)  Resp 16  Ht 5' 3.5" (1.613 m)  Wt 161 lb (73.029 kg)  BMI 28.07 kg/m2  SpO2 96%  LMP  (LMP Unknown)  Wt Readings from Last 3 Encounters:  02/11/15 161 lb (73.029 kg)  01/27/15 160 lb (72.576 kg)  07/16/14 157 lb (71.215 kg)    Physical Exam  Constitutional: She is oriented to person, place, and time. She appears well-developed and well-nourished.  HENT:  Head: Normocephalic and atraumatic.  Right Ear: Hearing and tympanic membrane normal.  Left Ear: Hearing and tympanic membrane normal.  Nose: Mucosal edema and rhinorrhea present. Right sinus exhibits no maxillary sinus tenderness and no frontal sinus tenderness. Left sinus exhibits no maxillary sinus tenderness and no frontal sinus tenderness.  Mouth/Throat: Oropharyngeal exudate present.    Neck: Neck supple. No Brudzinski's sign and no Kernig's sign noted.  Cardiovascular: Normal rate, regular rhythm and normal heart  sounds.  Exam reveals no gallop and no friction rub.   No murmur heard. Pulmonary/Chest: Effort normal and breath sounds normal. She has no wheezes. She exhibits no tenderness.  Abdominal: Soft. Normal appearance and bowel sounds are normal. She exhibits no distension and no mass. There is no tenderness. There is no rebound and no guarding.  Musculoskeletal: Normal range of motion. She exhibits no edema or tenderness.  Lymphadenopathy:    She has no cervical adenopathy.  Neurological: She is alert and oriented to person, place, and time.  Skin: Skin is warm and dry.   Results for orders placed or performed in visit on 02/11/15  POCT rapid strep A  Result Value Ref Range   Rapid Strep A Screen Negative Negative      Assessment & Plan:   Problem List Items Addressed This Visit    None    Visit Diagnoses    Exudative tonsillitis    -  Primary    Treat with antibiotic given exudate, beefy red tonsils and +AC nodes. Throat culture. PCN TID for 10 days. Supportive care reviewed. Alarm symptoms reviewed.     Relevant Medications    penicillin v potassium (VEETID) 250 MG/5ML solution    Other Relevant Orders    POCT rapid strep A (Completed)    Culture, Group A Strep    Upper respiratory infection        Supportive care. Cough medications given. Alarm symptoms reviewed. Encouraged use of Afrin for congestion considering patients history of hypertension.        Meds ordered this encounter  Medications  . penicillin v potassium (VEETID) 250 MG/5ML solution    Sig: Take 10 mLs (500 mg total) by mouth 3 (three) times daily.    Dispense:  100 mL    Refill:  0    Order Specific Question:  Supervising Provider    Answer:  Janeann Forehand [[id number]]  . oxymetazoline (AFRIN NASAL SPRAY) 0.05 % nasal spray    Sig: Place 1 spray into both nostrils 2 (two) times daily. Use for 3 days only.    Dispense:  30 mL    Refill:  0    Order Specific Question:  Supervising Provider    Answer:   Janeann Forehand [[id number]]  . benzonatate (TESSALON) 100 MG capsule    Sig: Take 1 capsule (100 mg total) by mouth 3 (three) times daily as needed.    Dispense:  30 capsule    Refill:  0    Order Specific Question:  Supervising Provider    Answer:  Janeann Forehand [[id number]]  . dextromethorphan (DELSYM) 30 MG/5ML liquid    Sig: Take 5 mLs (30 mg total) by mouth 2 (two) times daily.    Dispense:  89 mL    Refill:  0    Order Specific Question:  Supervising Provider    Answer:  Fidel Levy  H [[id number]]      Follow up plan: Return if symptoms worsen or fail to improve.

## 2015-03-10 ENCOUNTER — Encounter: Payer: Self-pay | Admitting: Unknown Physician Specialty

## 2015-03-10 ENCOUNTER — Telehealth: Payer: Self-pay | Admitting: Unknown Physician Specialty

## 2015-03-10 ENCOUNTER — Ambulatory Visit (INDEPENDENT_AMBULATORY_CARE_PROVIDER_SITE_OTHER): Payer: Medicare Other | Admitting: Unknown Physician Specialty

## 2015-03-10 ENCOUNTER — Ambulatory Visit
Admission: RE | Admit: 2015-03-10 | Discharge: 2015-03-10 | Disposition: A | Discharge status: Home / Self Care | Payer: Medicare Other | Source: Ambulatory Visit | Attending: Unknown Physician Specialty | Admitting: Unknown Physician Specialty

## 2015-03-10 VITALS — BP 152/88 | HR 76 | Temp 97.5°F | Ht 62.7 in | Wt 159.0 lb

## 2015-03-10 DIAGNOSIS — R059 Cough, unspecified: Secondary | ICD-10-CM

## 2015-03-10 DIAGNOSIS — R05 Cough: Secondary | ICD-10-CM | POA: Diagnosis not present

## 2015-03-10 MED ORDER — DOXYCYCLINE CALCIUM 50 MG/5ML PO SYRP: 100.0000 mg | ORAL_SOLUTION | Freq: Two times a day (BID) | ORAL | Status: DC

## 2015-03-10 NOTE — Telephone Encounter (Signed)
That is OK if she can swallow the pills

## 2015-03-10 NOTE — Telephone Encounter (Signed)
Called and let the pharmacy know that it was ok for the patient to take the pills instead of the liquid.

## 2015-03-10 NOTE — Progress Notes (Signed)
BP 152/88 mmHg  Pulse 76  Temp(Src) 97.5 F (36.4 C)  Ht 5' 2.7" (1.593 m)  Wt 159 lb (72.122 kg)  BMI 28.42 kg/m2  SpO2 96%  LMP  (LMP Unknown)   Subjective:    Patient ID: Carol [name], female    DOB: [birth year], 65 y.o.   MRN: [id number]  HPI: Carol [name] is a 65 y.o. female  Chief Complaint  Patient presents with  . Cough    pt states she has had a cough for about 2 to 3 weeks now, was given tessalon 100 mg but states it makes her tired   Cough  Pt is complaining of cough she has had since before December 12th when I saw her for a physical.  She was seen on 12/27 at [facility] and those notes were reviewed.   She was given PenV and Tessalon.  She finds the Kimberlee Nearing makes her tired.  Currently her cough is dry and it makes her chest hurt.  She has never smoked but her husband smokes in the house.  No chest x-ray was done.  Pt states it gets worse with gettting hot  Relevant past medical, surgical, family and social history reviewed and updated as indicated. Interim medical history since our last visit reviewed. Allergies and medications reviewed and updated.  Review of Systems  Per HPI unless specifically indicated above     Objective:    BP 152/88 mmHg  Pulse 76  Temp(Src) 97.5 F (36.4 C)  Ht 5' 2.7" (1.593 m)  Wt 159 lb (72.122 kg)  BMI 28.42 kg/m2  SpO2 96%  LMP  (LMP Unknown)  Wt Readings from Last 3 Encounters:  03/10/15 159 lb (72.122 kg)  02/11/15 161 lb (73.029 kg)  01/27/15 160 lb (72.576 kg)    Physical Exam  Constitutional: She is oriented to person, place, and time. She appears well-developed and well-nourished. No distress.  HENT:  Head: Normocephalic and atraumatic.  Right Ear: Tympanic membrane and ear canal normal.  Left Ear: Tympanic membrane and ear canal normal.  Nose: Rhinorrhea present. Right sinus exhibits no maxillary sinus tenderness and no frontal sinus tenderness. Left sinus exhibits no maxillary sinus tenderness and no  frontal sinus tenderness.  Mouth/Throat: Mucous membranes are normal. Posterior oropharyngeal erythema present.  Eyes: Conjunctivae and lids are normal. Right eye exhibits no discharge. Left eye exhibits no discharge. No scleral icterus.  Cardiovascular: Normal rate and regular rhythm.   Pulmonary/Chest: Effort normal and breath sounds normal. No respiratory distress.  Abdominal: Normal appearance. There is no splenomegaly or hepatomegaly.  Musculoskeletal: Normal range of motion.  Neurological: She is alert and oriented to person, place, and time.  Skin: Skin is intact. No rash noted. No pallor.  Psychiatric: She has a normal mood and affect. Her behavior is normal. Judgment and thought content normal.    Results for orders placed or performed in visit on 02/11/15  POCT rapid strep A  Result Value Ref Range   Rapid Strep A Screen Negative Negative      Assessment & Plan:   Problem List Items Addressed This Visit    None    Visit Diagnoses    Cough    -  Primary    Order chest x-ray.  Will rx another antibiotic to rule out atypical    Relevant Orders    DG Chest 2 View       R/o atypical.  Will rx Doxycycline as already had PenV.   -------------------------------------------------------------------------  Follow up plan: Return if symptoms worsen or fail to improve.

## 2015-03-10 NOTE — Telephone Encounter (Signed)
Pt was given doxycyline liquid form but that's $100 and the pharmacy would like to change it to the tablet form to reduce the price for the patient.

## 2015-03-10 NOTE — Telephone Encounter (Signed)
Routing to provider  

## 2015-04-03 ENCOUNTER — Other Ambulatory Visit: Payer: Self-pay | Admitting: Orthopedic Surgery

## 2015-04-03 DIAGNOSIS — M25561 Pain in right knee: Principal | ICD-10-CM

## 2015-04-03 DIAGNOSIS — G8929 Other chronic pain: Secondary | ICD-10-CM

## 2015-04-14 ENCOUNTER — Ambulatory Visit
Admission: RE | Admit: 2015-04-14 | Discharge: 2015-04-14 | Disposition: A | Discharge status: Home / Self Care | Payer: Medicare Other | Source: Ambulatory Visit | Attending: Orthopedic Surgery | Admitting: Orthopedic Surgery

## 2015-04-14 DIAGNOSIS — M25561 Pain in right knee: Principal | ICD-10-CM

## 2015-04-14 DIAGNOSIS — G8929 Other chronic pain: Secondary | ICD-10-CM

## 2015-05-05 IMAGING — CR DG ABDOMEN 3V
1 series · 3 of 3 positions shown · non-contrast
Comparison: None.

CLINICAL DATA: Vomiting since this morning.

EXAM:
ABDOMEN SERIES

[Series 1: dxr abdomen 3-way (incl pa cxr) · 0.14mm/px · 3 of 3 slices shown]
[im 1/3]
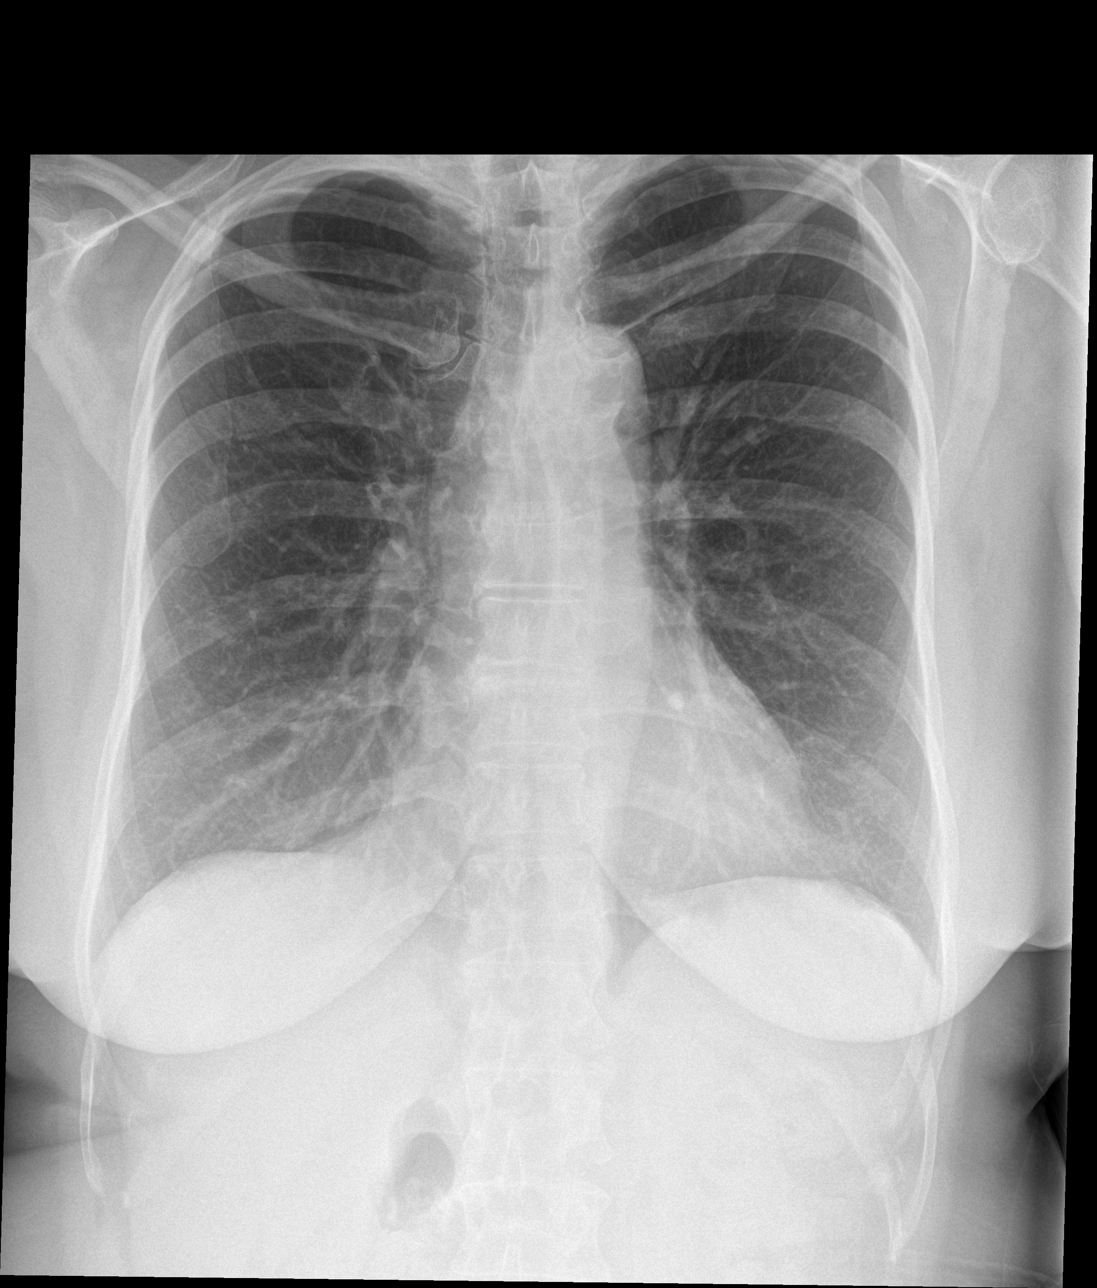
[im 2/3]
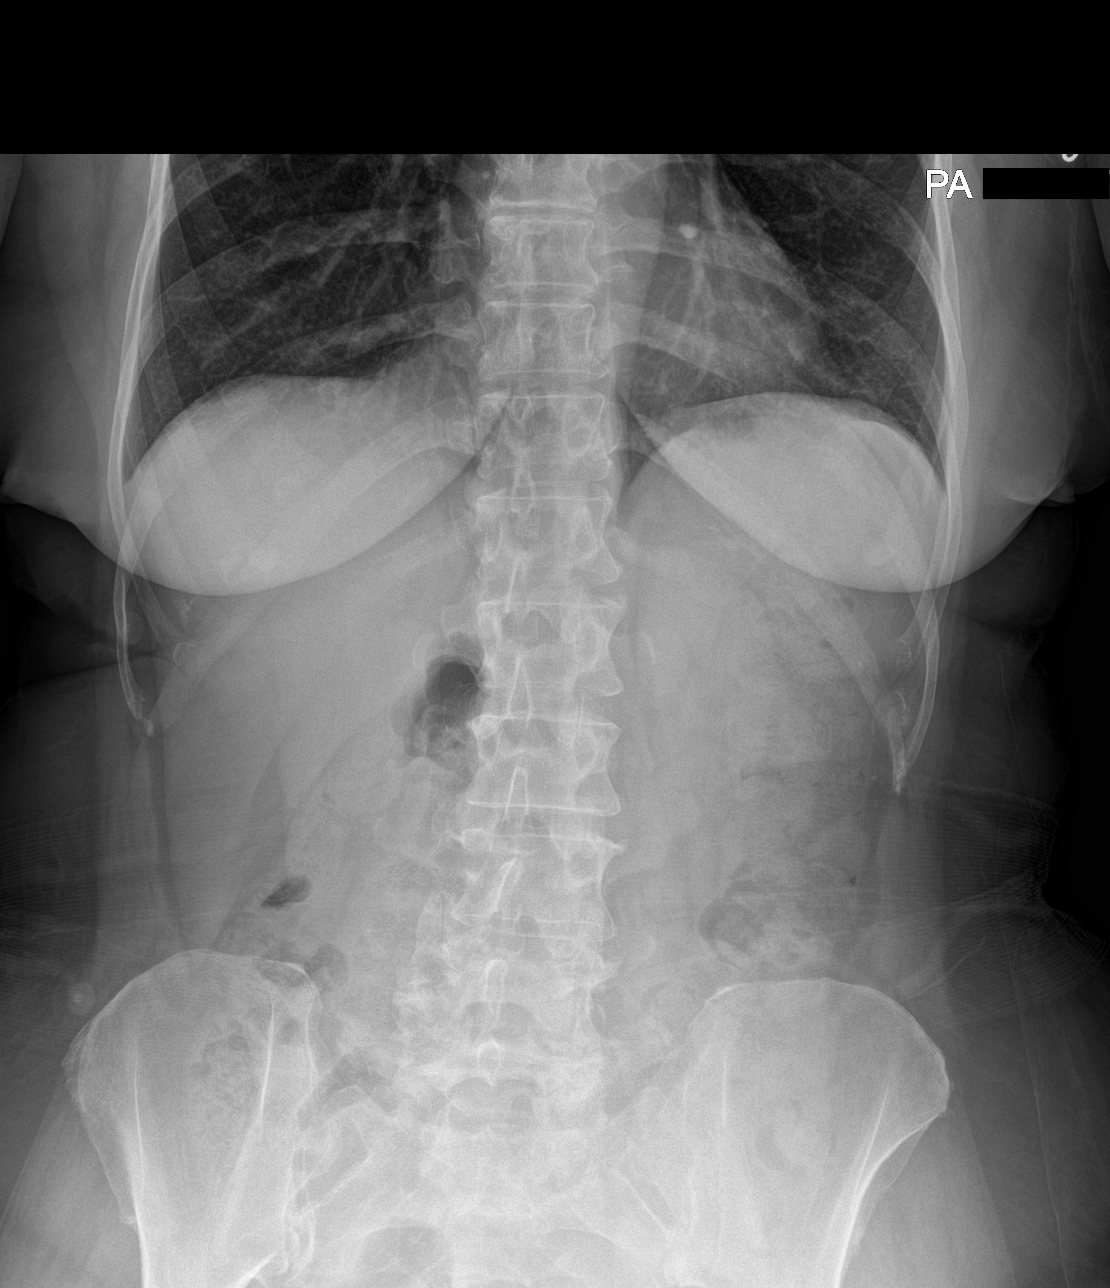
[im 3/3]
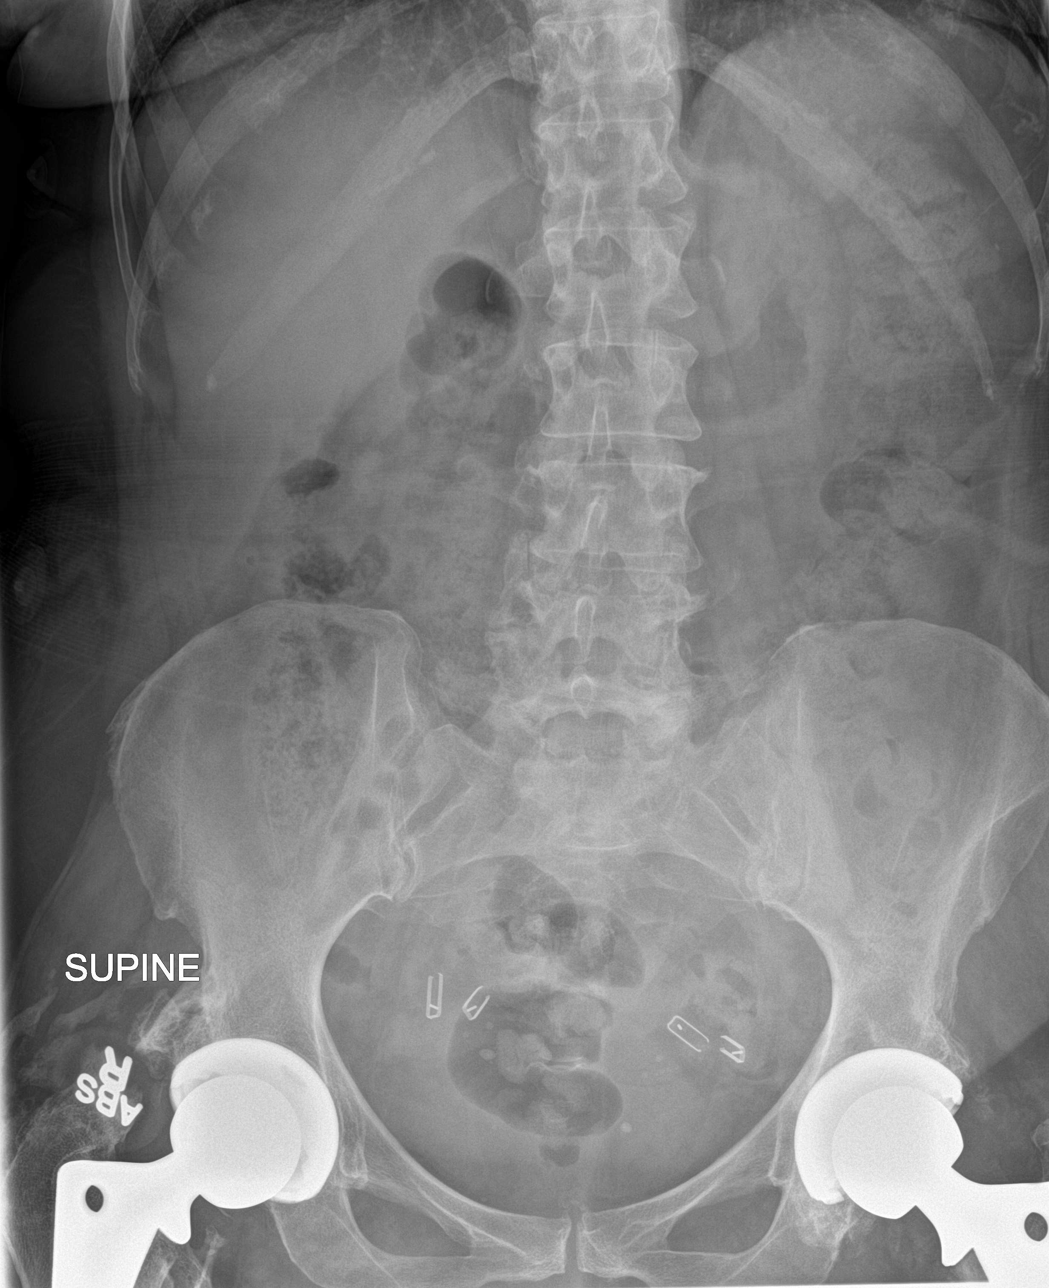

[3 of 3 positions shown; findings below may reference images not displayed]

FINDINGS: Moderate volume of formed stool, present in most colonic segments.
No small bowel obstruction or evidence of perforation. No concerning
intra-abdominal mass effect or calcification. Pelvic ligation clips.

Normal heart size and mediastinal contours. Large volume lungs. No
acute infiltrate or edema. No effusion or pneumothorax. No acute
osseous findings.

Bilateral total hip arthroplasty with extensive heterotopic
ossification on the right.
IMPRESSION: Possible constipation. No evidence of bowel obstruction or
perforation.

## 2015-06-30 ENCOUNTER — Other Ambulatory Visit: Payer: Self-pay | Admitting: Unknown Physician Specialty

## 2015-07-29 ENCOUNTER — Ambulatory Visit: Payer: Medicare Other | Admitting: Unknown Physician Specialty

## 2015-08-01 ENCOUNTER — Encounter: Payer: Self-pay | Admitting: Unknown Physician Specialty

## 2015-08-01 ENCOUNTER — Ambulatory Visit (INDEPENDENT_AMBULATORY_CARE_PROVIDER_SITE_OTHER): Payer: Medicare Other | Admitting: Unknown Physician Specialty

## 2015-08-01 VITALS — BP 134/74 | HR 86 | Temp 97.6°F | Ht 62.7 in | Wt 158.4 lb

## 2015-08-01 DIAGNOSIS — I10 Essential (primary) hypertension: Secondary | ICD-10-CM | POA: Diagnosis not present

## 2015-08-01 DIAGNOSIS — R05 Cough: Secondary | ICD-10-CM | POA: Diagnosis not present

## 2015-08-01 DIAGNOSIS — J069 Acute upper respiratory infection, unspecified: Secondary | ICD-10-CM | POA: Diagnosis not present

## 2015-08-01 DIAGNOSIS — R059 Cough, unspecified: Secondary | ICD-10-CM

## 2015-08-01 MED ORDER — BENZONATATE 100 MG PO CAPS: 100.0000 mg | ORAL_CAPSULE | Freq: Two times a day (BID) | ORAL | Status: DC | PRN

## 2015-08-01 NOTE — Assessment & Plan Note (Signed)
Stable, continue present medications.   

## 2015-08-01 NOTE — Patient Instructions (Signed)
Viral Infections °A viral infection can be caused by different types of viruses. Most viral infections are not serious and resolve on their own. However, some infections may cause severe symptoms and may lead to further complications. °SYMPTOMS °Viruses can frequently cause: °· Minor sore throat. °· Aches and pains. °· Headaches. °· Runny nose. °· Different types of rashes. °· Watery eyes. °· Tiredness. °· Cough. °· Loss of appetite. °· Gastrointestinal infections, resulting in nausea, vomiting, and diarrhea. °These symptoms do not respond to antibiotics because the infection is not caused by bacteria. However, you might catch a bacterial infection following the viral infection. This is sometimes called a "superinfection." Symptoms of such a bacterial infection may include: °· Worsening sore throat with pus and difficulty swallowing. °· Swollen neck glands. °· Chills and a high or persistent fever. °· Severe headache. °· Tenderness over the sinuses. °· Persistent overall ill feeling (malaise), muscle aches, and tiredness (fatigue). °· Persistent cough. °· Yellow, green, or brown mucus production with coughing. °HOME CARE INSTRUCTIONS  °· Only take over-the-counter or prescription medicines for pain, discomfort, diarrhea, or fever as directed by your caregiver. °· Drink enough water and fluids to keep your urine clear or pale yellow. Sports drinks can provide valuable electrolytes, sugars, and hydration. °· Get plenty of rest and maintain proper nutrition. Soups and broths with crackers or rice are fine. °SEEK IMMEDIATE MEDICAL CARE IF:  °· You have severe headaches, shortness of breath, chest pain, neck pain, or an unusual rash. °· You have uncontrolled vomiting, diarrhea, or you are unable to keep down fluids. °· You or your child has an oral temperature above 102° F (38.9° C), not controlled by medicine. °· Your baby is older than 3 months with a rectal temperature of 102° F (38.9° C) or higher. °· Your baby is 3  months old or younger with a rectal temperature of 100.4° F (38° C) or higher. °MAKE SURE YOU:  °· Understand these instructions. °· Will watch your condition. °· Will get help right away if you are not doing well or get worse. °  °This information is not intended to replace advice given to you by your health care provider. Make sure you discuss any questions you have with your health care provider. °  °Document Released: 11/11/2004 Document Revised: 04/26/2011 Document Reviewed: 07/10/2014 °Elsevier Interactive Patient Education ©2016 [employer] ° °

## 2015-08-01 NOTE — Assessment & Plan Note (Signed)
States she coughs when hot.  Order Spirometry next visit

## 2015-08-01 NOTE — Progress Notes (Signed)
BP 134/74 mmHg  Pulse 86  Temp(Src) 97.6 F (36.4 C)  Ht 5' 2.7" (1.593 m)  Wt 158 lb 6.4 oz (71.85 kg)  BMI 28.31 kg/m2  SpO2 94%  LMP  (LMP Unknown)   Subjective:    Patient ID: Carol [name], female    DOB: 12-May-1950, 65 y.o.   MRN: [id number]  HPI: Carol [name] is a 65 y.o. female  Chief Complaint  Patient presents with  . Hypertension  . URI    pt states she has runny nose and cough. states symptomes started around Monday   Hypertension Using medications without difficulty Average home BPs Not checking   No problems or lightheadedness No chest pain with exertion or shortness of breath No Edema  URI  This is a new problem. The current episode started in the past 7 days. The problem has been gradually improving. There has been no fever. Associated symptoms include rhinorrhea. Pertinent negatives include no coughing, diarrhea, dysuria, ear pain, nausea, rash, sneezing or sore throat. She has tried nothing for the symptoms.     Relevant past medical, surgical, family and social history reviewed and updated as indicated. Interim medical history since our last visit reviewed. Allergies and medications reviewed and updated.  Review of Systems  HENT: Positive for rhinorrhea. Negative for ear pain, sneezing and sore throat.   Respiratory: Negative for cough.   Gastrointestinal: Negative for nausea and diarrhea.  Genitourinary: Negative for dysuria.  Skin: Negative for rash.    Per HPI unless specifically indicated above     Objective:    BP 134/74 mmHg  Pulse 86  Temp(Src) 97.6 F (36.4 C)  Ht 5' 2.7" (1.593 m)  Wt 158 lb 6.4 oz (71.85 kg)  BMI 28.31 kg/m2  SpO2 94%  LMP  (LMP Unknown)  Wt Readings from Last 3 Encounters:  08/01/15 158 lb 6.4 oz (71.85 kg)  03/10/15 159 lb (72.122 kg)  02/11/15 161 lb (73.029 kg)    Physical Exam  Constitutional: She is oriented to person, place, and time. She appears well-developed and well-nourished. No distress.   HENT:  Head: Normocephalic and atraumatic.  Right Ear: Tympanic membrane and ear canal normal.  Left Ear: Tympanic membrane and ear canal normal.  Nose: Rhinorrhea present. Right sinus exhibits no maxillary sinus tenderness and no frontal sinus tenderness. Left sinus exhibits no maxillary sinus tenderness and no frontal sinus tenderness.  Mouth/Throat: Mucous membranes are normal. Posterior oropharyngeal erythema present.  Eyes: Conjunctivae and lids are normal. Right eye exhibits no discharge. Left eye exhibits no discharge. No scleral icterus.  Cardiovascular: Normal rate and regular rhythm.   Pulmonary/Chest: Effort normal and breath sounds normal. No respiratory distress.  Abdominal: Normal appearance. There is no splenomegaly or hepatomegaly.  Musculoskeletal: Normal range of motion.  Neurological: She is alert and oriented to person, place, and time.  Skin: Skin is intact. No rash noted. No pallor.  Psychiatric: She has a normal mood and affect. Her behavior is normal. Judgment and thought content normal.    Results for orders placed or performed in visit on 02/11/15  POCT rapid strep A  Result Value Ref Range   Rapid Strep A Screen Negative Negative      Assessment & Plan:   Problem List Items Addressed This Visit      Unprioritized   Hypertension - Primary    Stable, continue present medications.         Other Visit Diagnoses    URI (upper  respiratory infection)        Supportive care.  Tessalon Perles for cough        Follow up plan: No Follow-up on file.

## 2015-12-22 ENCOUNTER — Other Ambulatory Visit: Payer: Self-pay | Admitting: Unknown Physician Specialty

## 2015-12-22 DIAGNOSIS — Z1231 Encounter for screening mammogram for malignant neoplasm of breast: Secondary | ICD-10-CM

## 2015-12-23 ENCOUNTER — Encounter: Payer: Self-pay | Admitting: Unknown Physician Specialty

## 2015-12-23 ENCOUNTER — Telehealth: Payer: Self-pay | Admitting: Unknown Physician Specialty

## 2015-12-23 ENCOUNTER — Ambulatory Visit (INDEPENDENT_AMBULATORY_CARE_PROVIDER_SITE_OTHER): Payer: Medicare Other | Admitting: Unknown Physician Specialty

## 2015-12-23 DIAGNOSIS — H8110 Benign paroxysmal vertigo, unspecified ear: Secondary | ICD-10-CM

## 2015-12-23 MED ORDER — MECLIZINE HCL 32 MG PO TABS: 32.0000 mg | ORAL_TABLET | Freq: Three times a day (TID) | ORAL | 0 refills | Status: DC | PRN

## 2015-12-23 MED ORDER — MECLIZINE HCL 25 MG PO TABS: 25.0000 mg | ORAL_TABLET | Freq: Three times a day (TID) | ORAL | 0 refills | Status: DC | PRN

## 2015-12-23 NOTE — Progress Notes (Signed)
   BP 136/74 (BP Location: Left Arm, Patient Position: Sitting, Cuff Size: Normal)   Pulse 86   Temp 97.6 F (36.4 C)   Ht 5\' 4"  (1.626 m)   Wt 157 lb (71.2 kg)   LMP  (LMP Unknown)   SpO2 97%   BMI 26.95 kg/m    Subjective:    Patient ID: Carol [name], female    DOB: 11/01/1950, 65 y.o.   MRN: [id number]  HPI: Carol [name] is a 65 y.o. female  Chief Complaint  Patient presents with  . Dizziness    pt states when she got up yesterday morning she was very clumsy and walking side ways. states her head hurt when bending over and her ears itch some    Vertigo Pt with a history of vertigo with symptoms starting yesterday morning.  She is better today.  No fever.  She has seen ENT in the past.  No fever.  No hearing loss.   Relevant past medical, surgical, family and social history reviewed and updated as indicated. Interim medical history since our last visit reviewed. Allergies and medications reviewed and updated.  Review of Systems  Per HPI unless specifically indicated above     Objective:    BP 136/74 (BP Location: Left Arm, Patient Position: Sitting, Cuff Size: Normal)   Pulse 86   Temp 97.6 F (36.4 C)   Ht 5\' 4"  (1.626 m)   Wt 157 lb (71.2 kg)   LMP  (LMP Unknown)   SpO2 97%   BMI 26.95 kg/m   Wt Readings from Last 3 Encounters:  12/23/15 157 lb (71.2 kg)  08/01/15 158 lb 6.4 oz (71.8 kg)  03/10/15 159 lb (72.1 kg)    Physical Exam  Constitutional: She is oriented to person, place, and time. She appears well-developed and well-nourished. No distress.  HENT:  Head: Normocephalic and atraumatic.  Right Ear: Hearing, tympanic membrane and ear canal normal.  Left Ear: Hearing, tympanic membrane and ear canal normal.  Eyes: Conjunctivae and lids are normal. Right eye exhibits no discharge. Left eye exhibits no discharge. No scleral icterus.  Neck: Normal range of motion. Neck supple. No JVD present. Carotid bruit is not present.  Cardiovascular: Normal  rate, regular rhythm and normal heart sounds.   Pulmonary/Chest: Effort normal and breath sounds normal.  Abdominal: Normal appearance. There is no splenomegaly or hepatomegaly.  Musculoskeletal: Normal range of motion.  Neurological: She is alert and oriented to person, place, and time.  Skin: Skin is warm, dry and intact. No rash noted. No pallor.  Psychiatric: She has a normal mood and affect. Her behavior is normal. Judgment and thought content normal.    Results for orders placed or performed in visit on 02/11/15  POCT rapid strep A  Result Value Ref Range   Rapid Strep A Screen Negative Negative      Assessment & Plan:   Problem List Items Addressed This Visit      Unprioritized   Benign paroxysmal positional vertigo    Will rx Meclizine          Follow up plan: Return if symptoms worsen or fail to improve.

## 2015-12-23 NOTE — Telephone Encounter (Signed)
Pharmacy called and would like clarification on meclizine (ANTIVERT) 32 MG tablet. Pharmacy stated that they don't carry the 32 MG only the 12.5 or 25 MG.

## 2015-12-23 NOTE — Telephone Encounter (Signed)
Routing to provider. Please send new rx.

## 2015-12-23 NOTE — Patient Instructions (Addendum)
Benign Positional Vertigo Vertigo is the feeling that you or your surroundings are moving when they are not. Benign positional vertigo is the most common form of vertigo. The cause of this condition is not serious (is benign). This condition is triggered by certain movements and positions (is positional). This condition can be dangerous if it occurs while you are doing something that could endanger you or others, such as driving.  CAUSES In many cases, the cause of this condition is not known. It may be caused by a disturbance in an area of the inner ear that helps your brain to sense movement and balance. This disturbance can be caused by a viral infection (labyrinthitis), head injury, or repetitive motion. RISK FACTORS This condition is more likely to develop in:  Women.  People who are 50 years of age or older. SYMPTOMS Symptoms of this condition usually happen when you move your head or your eyes in different directions. Symptoms may start suddenly, and they usually last for less than a minute. Symptoms may include:  Loss of balance and falling.  Feeling like you are spinning or moving.  Feeling like your surroundings are spinning or moving.  Nausea and vomiting.  Blurred vision.  Dizziness.  Involuntary eye movement (nystagmus). Symptoms can be mild and cause only slight annoyance, or they can be severe and interfere with daily life. Episodes of benign positional vertigo may return (recur) over time, and they may be triggered by certain movements. Symptoms may improve over time. DIAGNOSIS This condition is usually diagnosed by medical history and a physical exam of the head, neck, and ears. You may be referred to a health care provider who specializes in ear, nose, and throat (ENT) problems (otolaryngologist) or a provider who specializes in disorders of the nervous system (neurologist). You may have additional testing, including:  MRI.  A CT scan.  Eye movement tests. Your  health care provider may ask you to change positions quickly while he or she watches you for symptoms of benign positional vertigo, such as nystagmus. Eye movement may be tested with an electronystagmogram (ENG), caloric stimulation, the Dix-Hallpike test, or the roll test.  An electroencephalogram (EEG). This records electrical activity in your brain.  Hearing tests. TREATMENT Usually, your health care provider will treat this by moving your head in specific positions to adjust your inner ear back to normal. Surgery may be needed in severe cases, but this is rare. In some cases, benign positional vertigo may resolve on its own in 2-4 weeks. HOME CARE INSTRUCTIONS Safety  Move slowly.Avoid sudden body or head movements.  Avoid driving.  Avoid operating heavy machinery.  Avoid doing any tasks that would be dangerous to you or others if a vertigo episode would occur.  If you have trouble walking or keeping your balance, try using a cane for stability. If you feel dizzy or unstable, sit down right away.  Return to your normal activities as told by your health care provider. Ask your health care provider what activities are safe for you. General Instructions  Take over-the-counter and prescription medicines only as told by your health care provider.  Avoid certain positions or movements as told by your health care provider.  Drink enough fluid to keep your urine clear or pale yellow.  Keep all follow-up visits as told by your health care provider. This is important. SEEK MEDICAL CARE IF:  You have a fever.  Your condition gets worse or you develop new symptoms.  Your family or friends   notice any behavioral changes.  Your nausea or vomiting gets worse.  You have numbness or a "pins and needles" sensation. SEEK IMMEDIATE MEDICAL CARE IF:  You have difficulty speaking or moving.  You are always dizzy.  You faint.  You develop severe headaches.  You have weakness in your  legs or arms.  You have changes in your hearing or vision.  You develop a stiff neck.  You develop sensitivity to light.   This information is not intended to replace advice given to you by your health care provider. Make sure you discuss any questions you have with your health care provider.   Document Released: 11/09/2005 Document Revised: 10/23/2014 Document Reviewed: 05/27/2014 Elsevier Interactive Patient Education 2016 [employer]  

## 2015-12-23 NOTE — Assessment & Plan Note (Signed)
Will rx Meclizine

## 2016-01-28 ENCOUNTER — Other Ambulatory Visit: Payer: Self-pay | Admitting: Unknown Physician Specialty

## 2016-01-30 ENCOUNTER — Ambulatory Visit (INDEPENDENT_AMBULATORY_CARE_PROVIDER_SITE_OTHER): Payer: Medicare Other | Admitting: Unknown Physician Specialty

## 2016-01-30 ENCOUNTER — Encounter: Payer: Self-pay | Admitting: Unknown Physician Specialty

## 2016-01-30 VITALS — BP 134/75 | HR 82 | Temp 98.0°F | Ht 62.6 in | Wt 154.4 lb

## 2016-01-30 DIAGNOSIS — R748 Abnormal levels of other serum enzymes: Secondary | ICD-10-CM

## 2016-01-30 DIAGNOSIS — Z0001 Encounter for general adult medical examination with abnormal findings: Secondary | ICD-10-CM

## 2016-01-30 DIAGNOSIS — I10 Essential (primary) hypertension: Secondary | ICD-10-CM

## 2016-01-30 DIAGNOSIS — H8110 Benign paroxysmal vertigo, unspecified ear: Secondary | ICD-10-CM | POA: Diagnosis not present

## 2016-01-30 DIAGNOSIS — Z0000 Encounter for general adult medical examination without abnormal findings: Secondary | ICD-10-CM

## 2016-01-30 NOTE — Assessment & Plan Note (Signed)
Much better.  Not taking Meclizine

## 2016-01-30 NOTE — Assessment & Plan Note (Signed)
Check today 

## 2016-01-30 NOTE — Progress Notes (Signed)
BP 134/75 (BP Location: Left Arm, Patient Position: Sitting, Cuff Size: Normal)   Pulse 82   Temp 98 F (36.7 C)   Ht 5' 2.6" (1.59 m)   Wt 154 lb 6.4 oz (70 kg)   LMP  (LMP Unknown)   SpO2 97%   BMI 27.70 kg/m    Subjective:    Patient ID: Carol [name], female    DOB: 07/05/1950, 65 y.o.   MRN: [id number]  HPI: Carol [name] is a 65 y.o. female  Chief Complaint  Patient presents with  . Medicare Wellness   Functional Status Survey: Is the patient deaf or have difficulty hearing?: No Does the patient have difficulty seeing, even when wearing glasses/contacts?: No Does the patient have difficulty concentrating, remembering, or making decisions?: No Does the patient have difficulty walking or climbing stairs?: No Does the patient have difficulty dressing or bathing?: No Does the patient have difficulty doing errands alone such as visiting a doctor's office or shopping?: No Fall Risk  01/30/2016 01/27/2015  Falls in the past year? No No   Depression screen [facility] 2/9 01/30/2016 01/27/2015  Decreased Interest 0 0  Down, Depressed, Hopeless 0 0  PHQ - 2 Score 0 0  Altered sleeping 0 -  Tired, decreased energy 0 -  Change in appetite 0 -  Feeling bad or failure about yourself  0 -  Trouble concentrating 0 -  Moving slowly or fidgety/restless 0 -  Suicidal thoughts 0 -  PHQ-9 Score 0 -   Hypertension Using medications without difficulty Average home BPs   No problems or lightheadedness No chest pain with exertion or shortness of breath No Edema  Vertigo Doing better   Relevant past medical, surgical, family and social history reviewed and updated as indicated. Interim medical history since our last visit reviewed. Allergies and medications reviewed and updated.  Review of Systems  Per HPI unless specifically indicated above     Objective:    BP 134/75 (BP Location: Left Arm, Patient Position: Sitting, Cuff Size: Normal)   Pulse 82   Temp 98 F (36.7 C)    Ht 5' 2.6" (1.59 m)   Wt 154 lb 6.4 oz (70 kg)   LMP  (LMP Unknown)   SpO2 97%   BMI 27.70 kg/m   Wt Readings from Last 3 Encounters:  01/30/16 154 lb 6.4 oz (70 kg)  12/23/15 157 lb (71.2 kg)  08/01/15 158 lb 6.4 oz (71.8 kg)    Physical Exam  Constitutional: She is oriented to person, place, and time. She appears well-developed and well-nourished.  HENT:  Head: Normocephalic and atraumatic.  Eyes: Pupils are equal, round, and reactive to light. Right eye exhibits no discharge. Left eye exhibits no discharge. No scleral icterus.  Neck: Normal range of motion. Neck supple. Carotid bruit is not present. No thyromegaly present.  Cardiovascular: Normal rate, regular rhythm and normal heart sounds.  Exam reveals no gallop and no friction rub.   No murmur heard. Pulmonary/Chest: Effort normal and breath sounds normal. No respiratory distress. She has no wheezes. She has no rales.  Abdominal: Soft. Bowel sounds are normal. There is no tenderness. There is no rebound.  Genitourinary: Vagina normal and uterus normal. No breast swelling, tenderness or discharge. Cervix exhibits no motion tenderness, no discharge and no friability. Right adnexum displays no mass, no tenderness and no fullness. Left adnexum displays no mass, no tenderness and no fullness.  Musculoskeletal: Normal range of motion.  Lymphadenopathy:  She has no cervical adenopathy.  Neurological: She is alert and oriented to person, place, and time.  Skin: Skin is warm, dry and intact. No rash noted.  Psychiatric: She has a normal mood and affect. Her speech is normal and behavior is normal. Judgment and thought content normal. Cognition and memory are normal.    Results for orders placed or performed in visit on 02/11/15  POCT rapid strep A  Result Value Ref Range   Rapid Strep A Screen Negative Negative      Assessment & Plan:   Problem List Items Addressed This Visit      Unprioritized   Alkaline phosphatase  elevation    Check today      RESOLVED: Benign paroxysmal positional vertigo    Much better.  Not taking Meclizine      Hypertension    Stable, continue present medications.        Relevant Orders   Comprehensive metabolic panel   Lipid Panel w/o Chol/HDL Ratio    Other Visit Diagnoses    Annual physical exam    -  Primary   Relevant Orders   Pap Lb, rfx HPV ASCU       Follow up plan: Return in about 6 months (around 07/30/2016).

## 2016-01-30 NOTE — Assessment & Plan Note (Signed)
Stable, continue present medications.   

## 2016-01-31 LAB — LIPID PANEL W/O CHOL/HDL RATIO
Cholesterol, Total: 180 mg/dL (ref 100–199)
HDL: 43 mg/dL (ref >39)
LDL Calculated: 120 mg/dL — ABNORMAL HIGH (ref 0–99)
TRIGLYCERIDES: 85 mg/dL (ref 0–149)
VLDL CHOLESTEROL CAL: 17 mg/dL (ref 5–40)

## 2016-01-31 LAB — COMPREHENSIVE METABOLIC PANEL
ALT: 13 IU/L (ref 0–32)
AST: 17 IU/L (ref 0–40)
Albumin/Globulin Ratio: 1.1 — ABNORMAL LOW (ref 1.2–2.2)
Albumin: 4 g/dL (ref 3.6–4.8)
Alkaline Phosphatase: 145 IU/L — ABNORMAL HIGH (ref 39–117)
BUN/Creatinine Ratio: 13 (ref 12–28)
BUN: 10 mg/dL (ref 8–27)
Bilirubin Total: 0.3 mg/dL (ref 0.0–1.2)
CALCIUM: 9.7 mg/dL (ref 8.7–10.3)
CO2: 26 mmol/L (ref 18–29)
Chloride: 97 mmol/L (ref 96–106)
Creatinine, Ser: 0.79 mg/dL (ref 0.57–1.00)
GFR, EST AFRICAN AMERICAN: 91 mL/min/{1.73_m2} (ref >59)
GFR, EST NON AFRICAN AMERICAN: 79 mL/min/{1.73_m2} (ref >59)
GLUCOSE: 99 mg/dL (ref 65–99)
Globulin, Total: 3.8 g/dL (ref 1.5–4.5)
Potassium: 4.2 mmol/L (ref 3.5–5.2)
Sodium: 137 mmol/L (ref 134–144)
TOTAL PROTEIN: 7.8 g/dL (ref 6.0–8.5)

## 2016-02-02 ENCOUNTER — Ambulatory Visit
Admission: RE | Admit: 2016-02-02 | Discharge: 2016-02-02 | Disposition: A | Discharge status: Home / Self Care | Payer: Medicare Other | Source: Ambulatory Visit | Attending: Unknown Physician Specialty | Admitting: Unknown Physician Specialty

## 2016-02-02 ENCOUNTER — Encounter: Payer: Self-pay | Admitting: Unknown Physician Specialty

## 2016-02-02 DIAGNOSIS — Z1231 Encounter for screening mammogram for malignant neoplasm of breast: Secondary | ICD-10-CM | POA: Diagnosis not present

## 2016-02-04 LAB — PAP LB, RFX HPV ASCU: PAP SMEAR COMMENT: 0

## 2016-02-13 ENCOUNTER — Ambulatory Visit (INDEPENDENT_AMBULATORY_CARE_PROVIDER_SITE_OTHER): Payer: Medicare Other | Admitting: Unknown Physician Specialty

## 2016-02-13 ENCOUNTER — Encounter: Payer: Self-pay | Admitting: Unknown Physician Specialty

## 2016-02-13 VITALS — BP 131/72 | HR 98 | Temp 97.9°F | Wt 156.0 lb

## 2016-02-13 DIAGNOSIS — Z23 Encounter for immunization: Secondary | ICD-10-CM

## 2016-02-13 DIAGNOSIS — R87615 Unsatisfactory cytologic smear of cervix: Secondary | ICD-10-CM

## 2016-02-13 NOTE — Progress Notes (Signed)
BP 131/72   Pulse 98   Temp 97.9 F (36.6 C)   Wt 156 lb (70.8 kg)   LMP  (LMP Unknown)   SpO2 99%   BMI 27.99 kg/m    Subjective:    Patient ID: Carol [name], female    DOB: 06/22/1950, 65 y.o.   MRN: [id number]  HPI: Carol [name] is a 65 y.o. female  Chief Complaint  Patient presents with  . Repeat Pap    Relevant past medical, surgical, family and social history reviewed and updated as indicated. Interim medical history since our last visit reviewed. Allergies and medications reviewed and updated.  Review of Systems  Per HPI unless specifically indicated above     Objective:    BP 131/72   Pulse 98   Temp 97.9 F (36.6 C)   Wt 156 lb (70.8 kg)   LMP  (LMP Unknown)   SpO2 99%   BMI 27.99 kg/m   Wt Readings from Last 3 Encounters:  02/13/16 156 lb (70.8 kg)  01/30/16 154 lb 6.4 oz (70 kg)  12/23/15 157 lb (71.2 kg)    Physical Exam  Genitourinary: Vagina normal. Cervix exhibits no motion tenderness, no discharge and no friability.    Results for orders placed or performed in visit on 01/30/16  Comprehensive metabolic panel  Result Value Ref Range   Glucose 99 65 - 99 mg/dL   BUN 10 8 - 27 mg/dL   Creatinine, Ser 6.570.79 0.57 - 1.00 mg/dL   GFR calc non Af Amer 79 >59 mL/min/1.73   GFR calc Af Amer 91 >59 mL/min/1.73   BUN/Creatinine Ratio 13 12 - 28   Sodium 137 134 - 144 mmol/L   Potassium 4.2 3.5 - 5.2 mmol/L   Chloride 97 96 - 106 mmol/L   CO2 26 18 - 29 mmol/L   Calcium 9.7 8.7 - 10.3 mg/dL   Total Protein 7.8 6.0 - 8.5 g/dL   Albumin 4.0 3.6 - 4.8 g/dL   Globulin, Total 3.8 1.5 - 4.5 g/dL   Albumin/Globulin Ratio 1.1 (L) 1.2 - 2.2   Bilirubin Total 0.3 0.0 - 1.2 mg/dL   Alkaline Phosphatase 145 (H) 39 - 117 IU/L   AST 17 0 - 40 IU/L   ALT 13 0 - 32 IU/L  Lipid Panel w/o Chol/HDL Ratio  Result Value Ref Range   Cholesterol, Total 180 100 - 199 mg/dL   Triglycerides 85 0 - 149 mg/dL   HDL 43 >84>39 mg/dL   VLDL Cholesterol Cal 17 5 -  40 mg/dL   LDL Calculated 696120 (H) 0 - 99 mg/dL  Pap Lb, rfx HPV ASCU  Result Value Ref Range   DIAGNOSIS: Comment    Specimen adequacy: Comment    CLINICIAN PROVIDED ICD10: Comment    Performed by: Comment    QC reviewed by: Comment    PAP SMEAR COMMENT .    PATHOLOGIST PROVIDED ICD10: Comment    Note: Comment    PAP REFLEX: Comment       Assessment & Plan:   Problem List Items Addressed This Visit    None    Visit Diagnoses    Needs flu shot    -  Primary   Encounter for immunization       Relevant Orders   Flu Vaccine QUAD 36+ mos IM (Completed)   Pap smear of cervix unsatisfactory       repeat pap   Relevant Orders   Pap Lb,  rfx HPV ASCU       Follow up plan: Return if symptoms worsen or fail to improve.

## 2016-02-17 ENCOUNTER — Encounter: Payer: Self-pay | Admitting: Unknown Physician Specialty

## 2016-02-17 LAB — PAP LB, RFX HPV ASCU: PAP Smear Comment: 0

## 2016-03-26 ENCOUNTER — Ambulatory Visit (INDEPENDENT_AMBULATORY_CARE_PROVIDER_SITE_OTHER): Payer: Medicare Other | Admitting: Unknown Physician Specialty

## 2016-03-26 ENCOUNTER — Encounter: Payer: Self-pay | Admitting: Unknown Physician Specialty

## 2016-03-26 DIAGNOSIS — M1712 Unilateral primary osteoarthritis, left knee: Secondary | ICD-10-CM | POA: Diagnosis not present

## 2016-03-26 MED ORDER — MELOXICAM 15 MG PO TABS: 15.0000 mg | ORAL_TABLET | Freq: Every day | ORAL | 0 refills | Status: DC

## 2016-03-26 NOTE — Assessment & Plan Note (Signed)
Rx for Meloxicam.  Supportive care.

## 2016-03-26 NOTE — Progress Notes (Signed)
   BP 124/75 (BP Location: Left Arm, Patient Position: Sitting, Cuff Size: Normal)   Pulse 81   Temp 97.7 F (36.5 C)   Wt 157 lb (71.2 kg)   LMP  (LMP Unknown)   SpO2 98%   BMI 28.17 kg/m    Subjective:    Patient ID: Carol [name], female    DOB: 07/18/1950, 66 y.o.   MRN: [id number]  HPI: Carol FitchLaverne B Fiorella is a 66 y.o. female  Chief Complaint  Patient presents with  . Knee Pain    pt states that the back of her left knee has been hurting since Monday   Knee pain Pt with left knee pain.  States it hurts for a week and a lump in the back which is better after using an ice pack.    Relevant past medical, surgical, family and social history reviewed and updated as indicated. Interim medical history since our last visit reviewed. Allergies and medications reviewed and updated.  Review of Systems  Per HPI unless specifically indicated above     Objective:    BP 124/75 (BP Location: Left Arm, Patient Position: Sitting, Cuff Size: Normal)   Pulse 81   Temp 97.7 F (36.5 C)   Wt 157 lb (71.2 kg)   LMP  (LMP Unknown)   SpO2 98%   BMI 28.17 kg/m   Wt Readings from Last 3 Encounters:  03/26/16 157 lb (71.2 kg)  02/13/16 156 lb (70.8 kg)  01/30/16 154 lb 6.4 oz (70 kg)    Physical Exam  Constitutional: She is oriented to person, place, and time. She appears well-developed and well-nourished. No distress.  HENT:  Head: Normocephalic and atraumatic.  Eyes: Conjunctivae and lids are normal. Right eye exhibits no discharge. Left eye exhibits no discharge. No scleral icterus.  Neck: Normal range of motion. Neck supple. No JVD present. Carotid bruit is not present.  Cardiovascular: Normal rate, regular rhythm and normal heart sounds.   Pulmonary/Chest: Effort normal and breath sounds normal.  Abdominal: Normal appearance. There is no splenomegaly or hepatomegaly.  Musculoskeletal:       Left knee: She exhibits decreased range of motion. She exhibits no swelling, no effusion,  no erythema, no LCL laxity and no bony tenderness.  Neurological: She is alert and oriented to person, place, and time.  Skin: Skin is warm, dry and intact. No rash noted. No pallor.  Psychiatric: She has a normal mood and affect. Her behavior is normal. Judgment and thought content normal.      Assessment & Plan:   Problem List Items Addressed This Visit      Unprioritized   Osteoarthritis of left knee    Rx for Meloxicam.  Supportive care.        Relevant Medications   meloxicam (MOBIC) 15 MG tablet      Pt will call back for an injection if needed.    Follow up plan: Return if symptoms worsen or fail to improve.

## 2016-06-08 LAB — HM DIABETES FOOT EXAM: HM DIABETIC FOOT EXAM: NORMAL

## 2016-06-10 ENCOUNTER — Other Ambulatory Visit: Payer: Self-pay | Admitting: Unknown Physician Specialty

## 2016-07-23 IMAGING — CR DG CHEST 2V
1 series · 2 of 2 positions shown · non-contrast
Comparison: None.

CLINICAL DATA: Approximate 1-1/2 week history of productive cough.
Chest pain when coughing.

EXAM:
CHEST  2 VIEW

[Series 1: pa · 0.17mm/px · 2 of 2 slices shown]
[im 1/2]
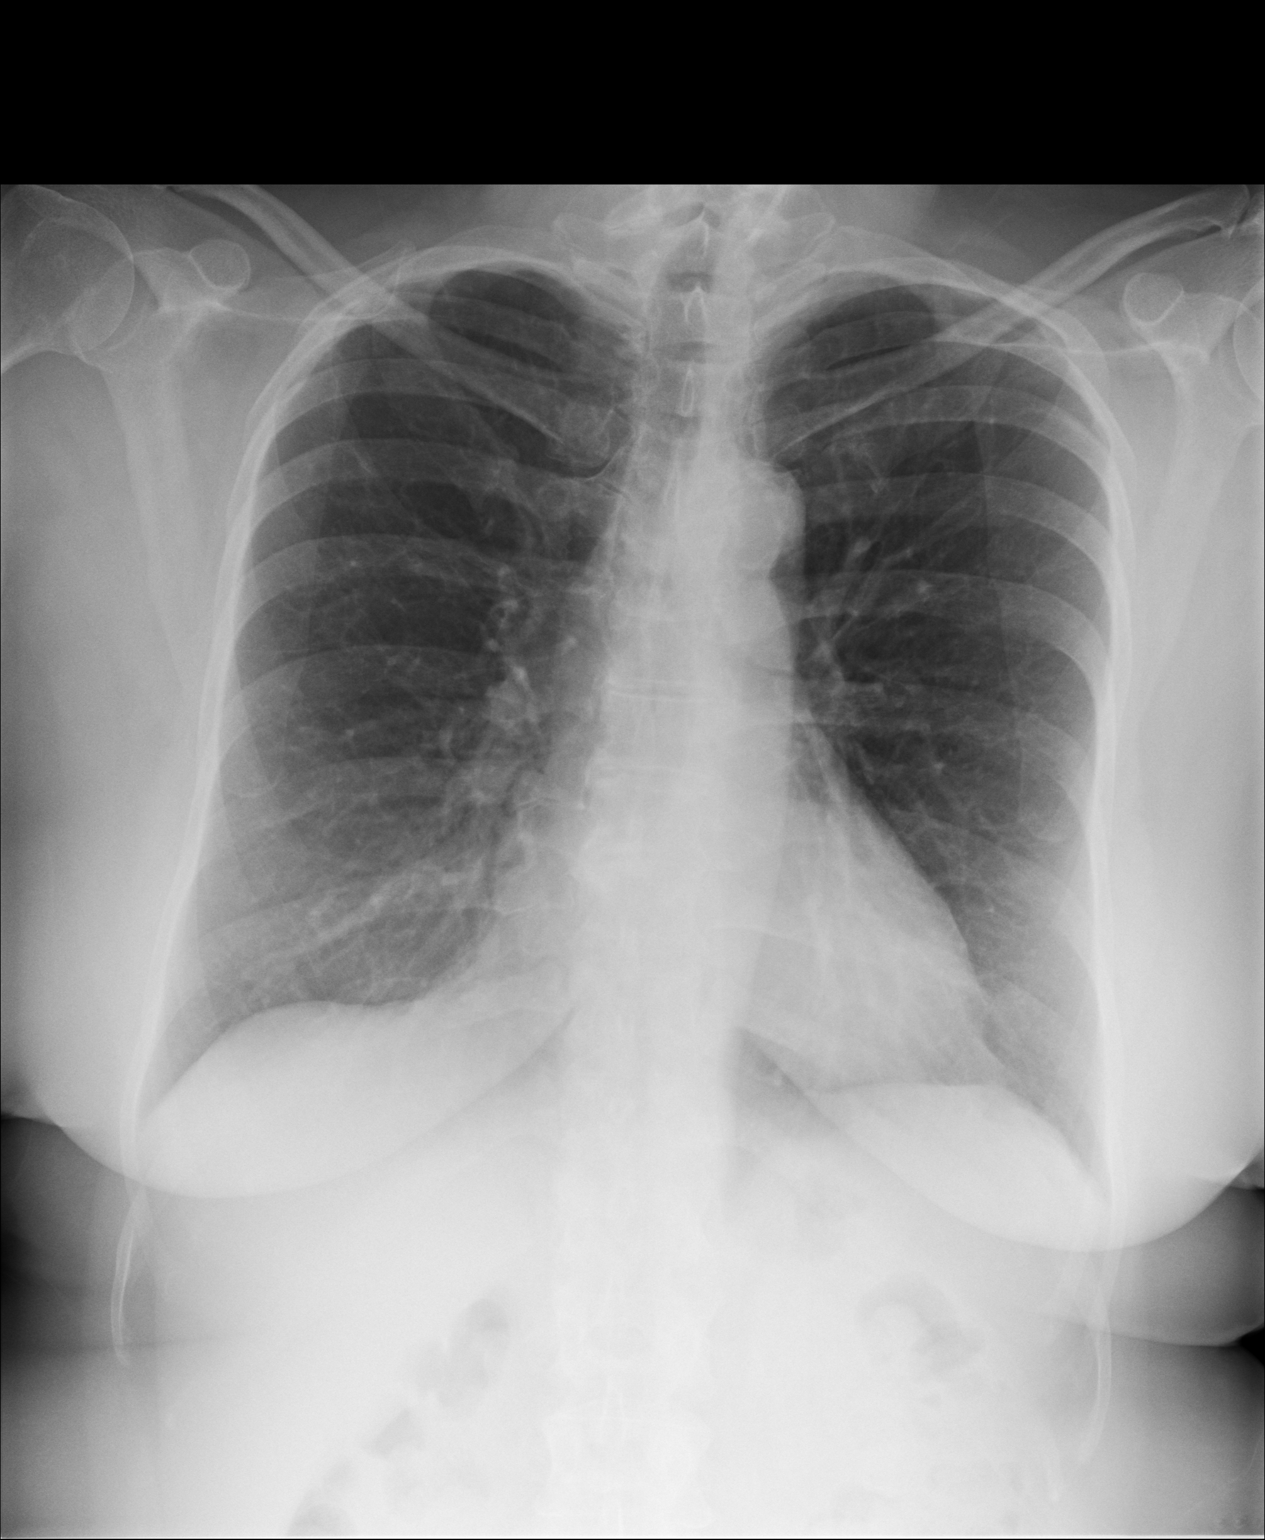
[im 2/2]
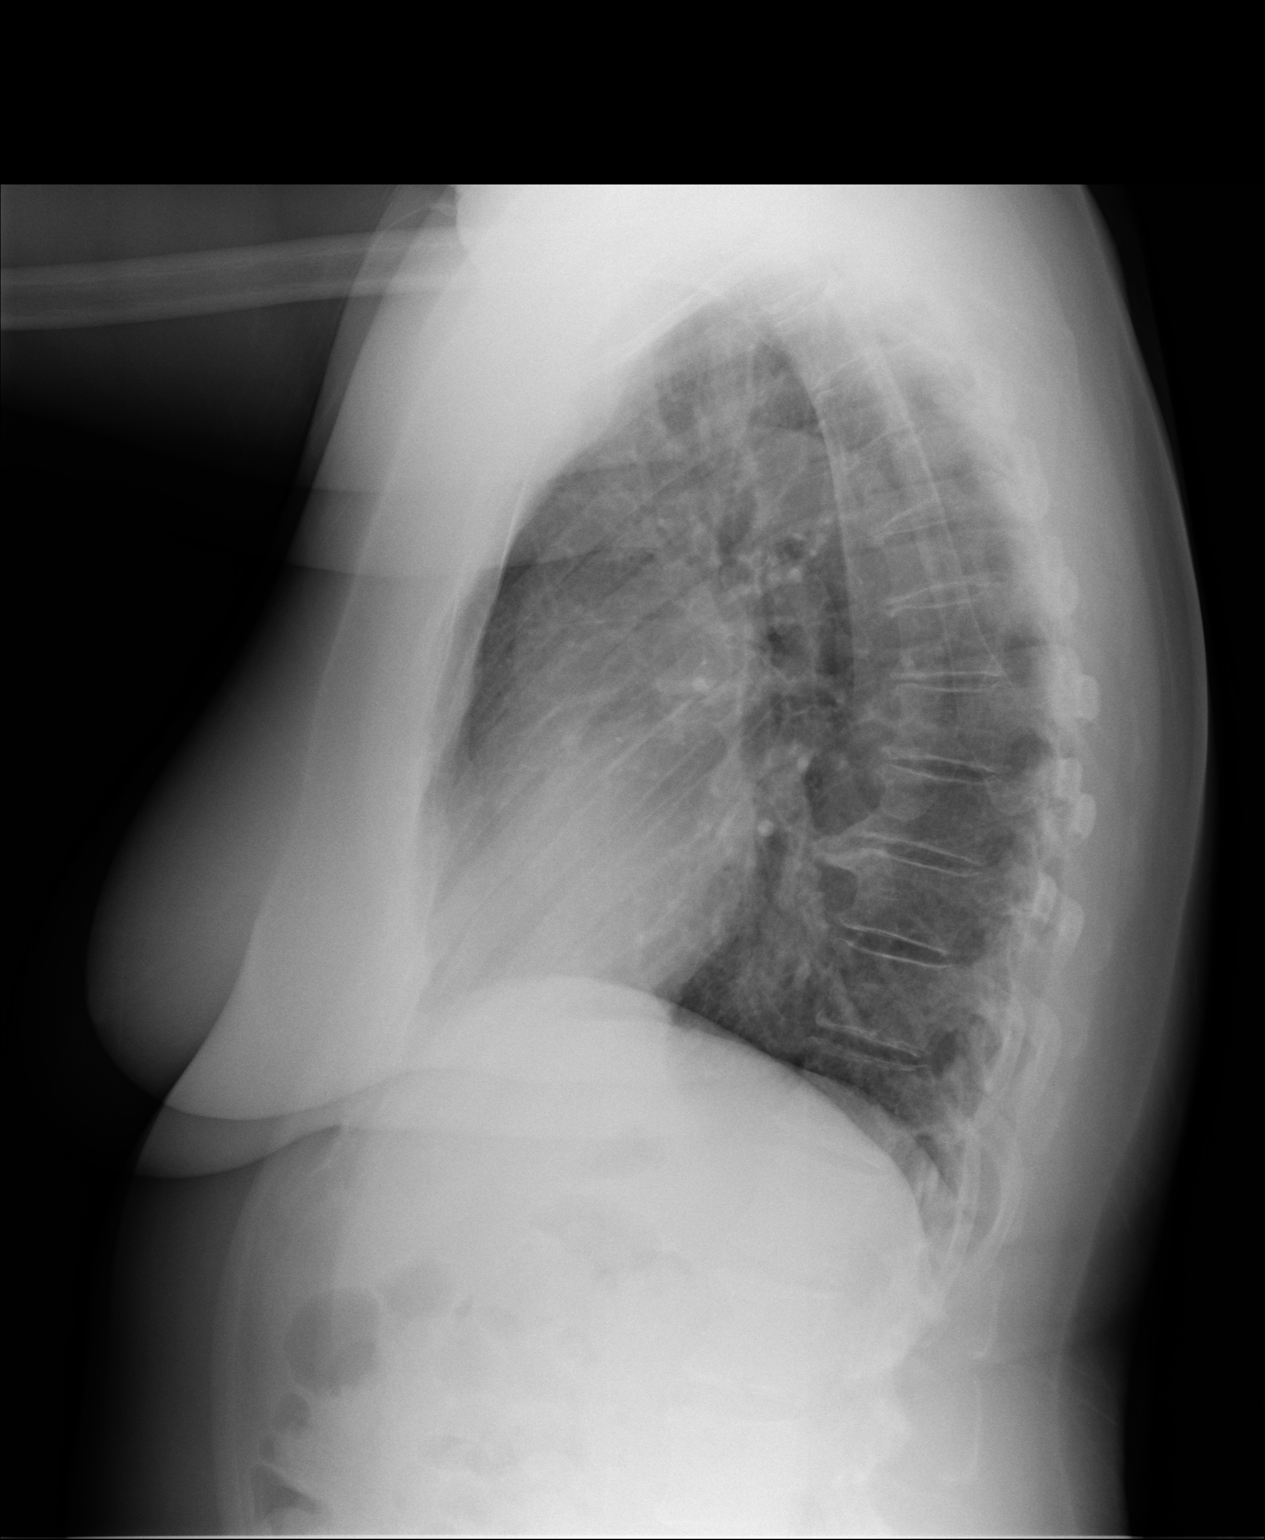

[2 of 2 positions shown; findings below may reference images not displayed]

FINDINGS: Cardiac silhouette normal in size. Mild atherosclerosis involving
the aortic arch. Hilar and mediastinal contours otherwise
unremarkable. Lungs clear. Bronchovascular markings normal.
Pulmonary vascularity normal. No visible pleural effusions. No
pneumothorax. Large bridging anterior osteophyte between T9 and T10
with mild degenerative disc disease elsewhere throughout the
thoracic spine.
IMPRESSION: No acute cardiopulmonary disease.

## 2016-07-30 ENCOUNTER — Ambulatory Visit (INDEPENDENT_AMBULATORY_CARE_PROVIDER_SITE_OTHER): Payer: Medicare Other | Admitting: Unknown Physician Specialty

## 2016-07-30 ENCOUNTER — Encounter: Payer: Self-pay | Admitting: Unknown Physician Specialty

## 2016-07-30 ENCOUNTER — Other Ambulatory Visit: Payer: Self-pay | Admitting: Unknown Physician Specialty

## 2016-07-30 VITALS — BP 118/69 | HR 82 | Temp 97.7°F | Ht 63.1 in | Wt 157.1 lb

## 2016-07-30 DIAGNOSIS — I10 Essential (primary) hypertension: Secondary | ICD-10-CM | POA: Diagnosis not present

## 2016-07-30 DIAGNOSIS — R05 Cough: Secondary | ICD-10-CM | POA: Diagnosis not present

## 2016-07-30 DIAGNOSIS — R059 Cough, unspecified: Secondary | ICD-10-CM

## 2016-07-30 MED ORDER — AMLODIPINE BESYLATE 5 MG PO TABS: 5.0000 mg | ORAL_TABLET | Freq: Every day | ORAL | 1 refills | Status: DC

## 2016-07-30 NOTE — Progress Notes (Signed)
BP 118/69   Pulse 82   Temp 97.7 F (36.5 C)   Ht 5' 3.1" (1.603 m)   Wt 157 lb 1.6 oz (71.3 kg)   LMP  (LMP Unknown)   SpO2 97%   BMI 27.74 kg/m    Subjective:    Patient ID: Carol [name], female    DOB: January 05, 1951, 66 y.o.   MRN: [id number]  HPI: SABREENA VOGAN is a 66 y.o. female  Chief Complaint  Patient presents with  . Hypertension  . Cough    pt states that she has a dry cough that she would like something for    Hypertension Using medications without difficulty Average home BPs not checking   No problems or lightheadedness No chest pain with exertion or shortness of breath No Edema  Cough Dry cough for 2 days.  She is a non-smoker but husband smokes.  Admits to runny nose.  No fever.    Social History   Social History  . Marital status: Married    Spouse name: N/A  . Number of children: N/A  . Years of education: N/A   Occupational History  . Not on file.   Social History Main Topics  . Smoking status: Never Smoker  . Smokeless tobacco: Never Used  . Alcohol use No  . Drug use: No  . Sexual activity: Yes   Other Topics Concern  . Not on file   Social History Narrative  . No narrative on file     Relevant past medical, surgical, family and social history reviewed and updated as indicated. Interim medical history since our last visit reviewed. Allergies and medications reviewed and updated.  Review of Systems  Per HPI unless specifically indicated above     Objective:    BP 118/69   Pulse 82   Temp 97.7 F (36.5 C)   Ht 5' 3.1" (1.603 m)   Wt 157 lb 1.6 oz (71.3 kg)   LMP  (LMP Unknown)   SpO2 97%   BMI 27.74 kg/m   Wt Readings from Last 3 Encounters:  07/30/16 157 lb 1.6 oz (71.3 kg)  03/26/16 157 lb (71.2 kg)  02/13/16 156 lb (70.8 kg)    Physical Exam  Constitutional: She is oriented to person, place, and time. She appears well-developed and well-nourished. No distress.  HENT:  Head: Normocephalic and atraumatic.    Nose: Rhinorrhea present. Right sinus exhibits no maxillary sinus tenderness and no frontal sinus tenderness. Left sinus exhibits no maxillary sinus tenderness and no frontal sinus tenderness.  Eyes: Conjunctivae and lids are normal. Right eye exhibits no discharge. Left eye exhibits no discharge. No scleral icterus.  Neck: Normal range of motion. Neck supple. No JVD present. Carotid bruit is not present.  Cardiovascular: Normal rate, regular rhythm and normal heart sounds.   Pulmonary/Chest: Effort normal and breath sounds normal.  Abdominal: Normal appearance. There is no splenomegaly or hepatomegaly.  Musculoskeletal: Normal range of motion.  Neurological: She is alert and oriented to person, place, and time.  Skin: Skin is warm, dry and intact. No rash noted. No pallor.  Psychiatric: She has a normal mood and affect. Her behavior is normal. Judgment and thought content normal.    Results for orders placed or performed in visit on 07/13/16  HM DIABETES FOOT EXAM  Result Value Ref Range   HM Diabetic Foot Exam bilateral normal- Done by [facility]       Assessment & Plan:   Problem  List Items Addressed This Visit      Unprioritized   Hypertension    Stable, continue present medications.        Relevant Medications   amLODipine (NORVASC) 5 MG tablet   Other Relevant Orders   Comprehensive metabolic panel   Lipid Panel w/o Chol/HDL Ratio    Other Visit Diagnoses    Cough    -  Primary   Probably related to allergies.  Out of Flonase.  Will refill.  She has [employer]yer[employer]essalon Perles which are OK to use.         Follow up plan: Return in about 6 months (around 01/29/2017) for physica.

## 2016-07-30 NOTE — Assessment & Plan Note (Signed)
Stable, continue present medications.   

## 2016-07-30 NOTE — Telephone Encounter (Signed)
Routing to provider  

## 2016-07-31 LAB — COMPREHENSIVE METABOLIC PANEL
A/G RATIO: 1.1 — AB (ref 1.2–2.2)
ALK PHOS: 116 IU/L (ref 39–117)
ALT: 13 IU/L (ref 0–32)
AST: 20 IU/L (ref 0–40)
Albumin: 4 g/dL (ref 3.6–4.8)
BILIRUBIN TOTAL: 0.4 mg/dL (ref 0.0–1.2)
BUN/Creatinine Ratio: 13 (ref 12–28)
BUN: 11 mg/dL (ref 8–27)
CHLORIDE: 99 mmol/L (ref 96–106)
CO2: 27 mmol/L (ref 20–29)
Calcium: 9.6 mg/dL (ref 8.7–10.3)
Creatinine, Ser: 0.85 mg/dL (ref 0.57–1.00)
GFR calc non Af Amer: 72 mL/min/{1.73_m2} (ref >59)
GFR, EST AFRICAN AMERICAN: 83 mL/min/{1.73_m2} (ref >59)
Globulin, Total: 3.5 g/dL (ref 1.5–4.5)
Glucose: 97 mg/dL (ref 65–99)
POTASSIUM: 4.1 mmol/L (ref 3.5–5.2)
Sodium: 139 mmol/L (ref 134–144)
Total Protein: 7.5 g/dL (ref 6.0–8.5)

## 2016-07-31 LAB — LIPID PANEL W/O CHOL/HDL RATIO
Cholesterol, Total: 193 mg/dL (ref 100–199)
HDL: 41 mg/dL (ref >39)
LDL Calculated: 131 mg/dL — ABNORMAL HIGH (ref 0–99)
TRIGLYCERIDES: 106 mg/dL (ref 0–149)
VLDL Cholesterol Cal: 21 mg/dL (ref 5–40)

## 2016-08-02 ENCOUNTER — Encounter: Payer: Self-pay | Admitting: Unknown Physician Specialty

## 2016-10-11 ENCOUNTER — Encounter: Payer: Self-pay | Admitting: Family Medicine

## 2016-10-11 ENCOUNTER — Ambulatory Visit (INDEPENDENT_AMBULATORY_CARE_PROVIDER_SITE_OTHER): Payer: Medicare Other | Admitting: Family Medicine

## 2016-10-11 VITALS — BP 119/76 | HR 90 | Temp 98.3°F | Wt 152.0 lb

## 2016-10-11 DIAGNOSIS — J029 Acute pharyngitis, unspecified: Secondary | ICD-10-CM | POA: Diagnosis not present

## 2016-10-11 MED ORDER — AMOXICILLIN-POT CLAVULANATE 250-62.5 MG/5ML PO SUSR: 500.0000 mg | Freq: Two times a day (BID) | ORAL | 0 refills | Status: DC

## 2016-10-11 NOTE — Patient Instructions (Signed)
Follow up if no improvement 

## 2016-10-11 NOTE — Progress Notes (Signed)
BP 119/76   Pulse 90   Temp 98.3 F (36.8 C)   Wt 152 lb (68.9 kg)   LMP  (LMP Unknown)   SpO2 98%   BMI 26.84 kg/m    Subjective:    Patient ID: Carol [name], female    DOB: [birth year], 66 y.o.   MRN: [id number]  HPI: Carol [name] is a 66 y.o. female  Chief Complaint  Patient presents with  . Sore Throat    x 2 days, [facility] and [facility] lidocaine which she said made her throw up. No other URI symptoms, no fever.   Patient presents with 2 days of severe right sided sore and swollen throat. Having difficulty eating solids since onset, pain was 10/10 the first day or so and now at a 7/10. [facility], rapid strep negative and given viscous lidocaine. Lidocaine made her throw up so has d/c'd. Taking claritin and flonase daily for allergies. Taking tylenol and using throat lozenges prn. Denies fever, chills, congestion, cough. No sick contacts.   Relevant past medical, surgical, family and social history reviewed and updated as indicated. Interim medical history since our last visit reviewed. Allergies and medications reviewed and updated.  Review of Systems  Constitutional: Negative.   HENT: Positive for sore throat.   Eyes: Negative.   Respiratory: Negative.   Cardiovascular: Negative.   Gastrointestinal: Negative.   Musculoskeletal: Negative.   Neurological: Negative.   Hematological: Positive for adenopathy.    Per HPI unless specifically indicated above     Objective:    BP 119/76   Pulse 90   Temp 98.3 F (36.8 C)   Wt 152 lb (68.9 kg)   LMP  (LMP Unknown)   SpO2 98%   BMI 26.84 kg/m   Wt Readings from Last 3 Encounters:  10/11/16 152 lb (68.9 kg)  07/30/16 157 lb 1.6 oz (71.3 kg)  03/26/16 157 lb (71.2 kg)    Physical Exam  Constitutional: She is oriented to person, place, and time. She appears well-developed and well-nourished. No distress.  HENT:  Head: Atraumatic.  Right Ear: External ear normal.    Left Ear: External ear normal.  Nose: Nose normal.  Oropharynx diffusely erythematous, with moderate - significant tonsillar edema on right. Minimal exudates noted  Eyes: Pupils are equal, round, and reactive to light. Conjunctivae are normal.  Neck: Normal range of motion. Neck supple.  Cardiovascular: Normal rate and normal heart sounds.   Pulmonary/Chest: Effort normal and breath sounds normal. No respiratory distress.  Musculoskeletal: Normal range of motion.  Lymphadenopathy:    She has cervical adenopathy (right, focal).  Neurological: She is alert and oriented to person, place, and time.  Skin: Skin is warm and dry.  Psychiatric: She has a normal mood and affect. Her behavior is normal.  Nursing note and vitals reviewed.     Assessment & Plan:   Problem List Items Addressed This Visit    None    Visit Diagnoses    Pharyngitis, unspecified etiology    -  Primary   Repeat rapid strep neg, await cx. Will start augmentin (pt prefers liquid) in meantime as this is not behaving like a virus and given severity.    Relevant Orders   Rapid strep screen (not at [facility])    Continue OTC tylenol and throat lozenges prn, push fluids, rest.    Follow up plan: Return if symptoms worsen or fail to improve.

## 2016-10-14 ENCOUNTER — Telehealth: Payer: Self-pay | Admitting: Unknown Physician Specialty

## 2016-10-14 ENCOUNTER — Encounter: Payer: Self-pay | Admitting: Family Medicine

## 2016-10-14 LAB — CULTURE, GROUP A STREP: Strep A Culture: NEGATIVE

## 2016-10-14 LAB — RAPID STREP SCREEN (MED CTR MEBANE ONLY): STREP GP A AG, IA W/REFLEX: NEGATIVE

## 2016-10-14 NOTE — Telephone Encounter (Signed)
Called and left patient a VM asking for her to please return my call.  

## 2016-10-14 NOTE — Telephone Encounter (Signed)
Patient would like to know if her strep test results were back. She said please call her cell phone at [phone]   Thank You

## 2016-10-14 NOTE — Telephone Encounter (Signed)
It looks like culture came back negative. Fleet ContrasRachel, is there anything else I need or should tell the patient?

## 2016-10-14 NOTE — Telephone Encounter (Signed)
Have her complete the augmentin course, salt water gargles if no improvement.

## 2016-10-14 NOTE — Telephone Encounter (Signed)
Spoke with patient gave her the information Fleet ContrasRachel had documented.

## 2016-10-28 ENCOUNTER — Ambulatory Visit (INDEPENDENT_AMBULATORY_CARE_PROVIDER_SITE_OTHER): Payer: Medicare Other | Admitting: Family Medicine

## 2016-10-28 ENCOUNTER — Encounter: Payer: Self-pay | Admitting: Family Medicine

## 2016-10-28 VITALS — BP 133/74 | HR 85 | Temp 97.7°F | Wt 155.2 lb

## 2016-10-28 DIAGNOSIS — L509 Urticaria, unspecified: Secondary | ICD-10-CM | POA: Diagnosis not present

## 2016-10-28 MED ORDER — PREDNISONE 10 MG PO TABS
ORAL_TABLET | ORAL | 0 refills | Status: DC
Start: 2016-10-28 — End: 2017-01-31

## 2016-10-28 MED ORDER — TRIAMCINOLONE ACETONIDE 0.1 % EX CREA: 1.0000 "application " | TOPICAL_CREAM | Freq: Two times a day (BID) | CUTANEOUS | 0 refills | Status: DC

## 2016-10-28 NOTE — Progress Notes (Signed)
   BP 133/74   Pulse 85   Temp 97.7 F (36.5 C)   Wt 155 lb 3.2 oz (70.4 kg)   LMP  (LMP Unknown)   SpO2 98%   BMI 27.41 kg/m    Subjective:    Patient ID: Carol FitchLaverne B Hakim, female    DOB: 10/19/1950, 66 y.o.   MRN: [id number]  HPI: Carol FitchLaverne B Coldwell is a 66 y.o. female  Chief Complaint  Patient presents with  . Rash    pt states that she has a rash on her arms, stomach, and vaginal area. States that the rash itches and it first came up yesterday   Patient presents with diffuse pruritic rash that appeared yesterday with no apparent trigger. No new soaps, home products, foods, medications, outdoor exposures, bites. Has been taking claritin with no relief. Denies difficulty breathing, throat itching or swelling.   Relevant past medical, surgical, family and social history reviewed and updated as indicated. Interim medical history since our last visit reviewed. Allergies and medications reviewed and updated.  Review of Systems  Constitutional: Negative.   Respiratory: Negative.   Cardiovascular: Negative.   Gastrointestinal: Negative.   Genitourinary: Negative.   Musculoskeletal: Negative.   Skin: Positive for rash.  Neurological: Negative.   Psychiatric/Behavioral: Negative.    Per HPI unless specifically indicated above     Objective:    BP 133/74   Pulse 85   Temp 97.7 F (36.5 C)   Wt 155 lb 3.2 oz (70.4 kg)   LMP  (LMP Unknown)   SpO2 98%   BMI 27.41 kg/m   Wt Readings from Last 3 Encounters:  10/28/16 155 lb 3.2 oz (70.4 kg)  10/11/16 152 lb (68.9 kg)  07/30/16 157 lb 1.6 oz (71.3 kg)    Physical Exam  Constitutional: She is oriented to person, place, and time. She appears well-developed and well-nourished.  HENT:  Head: Atraumatic.  Eyes: Conjunctivae are normal. No scleral icterus.  Neck: Normal range of motion. Neck supple.  Cardiovascular: Normal rate and normal heart sounds.   Pulmonary/Chest: Effort normal and breath sounds normal. No respiratory  distress.  Musculoskeletal: Normal range of motion.  Neurological: She is alert and oriented to person, place, and time.  Skin: Skin is warm and dry. Rash (Diffuse urticarial rash across most of body, worst on chest, arms, and groin area) noted.  Psychiatric: She has a normal mood and affect. Her behavior is normal.  Nursing note and vitals reviewed.     Assessment & Plan:   Problem List Items Addressed This Visit    None    Visit Diagnoses    Urticaria    -  Primary   Unclear trigger. Will treat with extended prednisone taper, triamcinolone cream, and claritin BID until resolved. F/u if no improvement       Follow up plan: Return if symptoms worsen or fail to improve.

## 2016-10-29 NOTE — Patient Instructions (Signed)
Follow up as needed

## 2016-12-29 ENCOUNTER — Other Ambulatory Visit: Payer: Self-pay | Admitting: Unknown Physician Specialty

## 2016-12-29 DIAGNOSIS — Z1231 Encounter for screening mammogram for malignant neoplasm of breast: Secondary | ICD-10-CM

## 2017-01-19 ENCOUNTER — Other Ambulatory Visit: Payer: Self-pay | Admitting: Unknown Physician Specialty

## 2017-01-31 ENCOUNTER — Ambulatory Visit (INDEPENDENT_AMBULATORY_CARE_PROVIDER_SITE_OTHER): Payer: Medicare Other

## 2017-01-31 VITALS — BP 118/64 | HR 64 | Temp 97.9°F | Resp 16 | Ht 64.0 in | Wt 156.0 lb

## 2017-01-31 DIAGNOSIS — Z0000 Encounter for general adult medical examination without abnormal findings: Secondary | ICD-10-CM | POA: Diagnosis not present

## 2017-01-31 NOTE — Patient Instructions (Addendum)
Carol [name] , Thank you for taking time to come for your Medicare Wellness Visit. I appreciate your ongoing commitment to your health goals. Please review the following plan we discussed and let me know if I can assist you in the future.   Screening recommendations/referrals: Colonoscopy: due dow- patient does FIT test at home Mammogram: scheduled for 02/02/2017 at 9:40am  Bone Density: completed 05/25/2012 Recommended yearly ophthalmology/optometry visit for glaucoma screening and checkup Recommended yearly dental visit for hygiene and checkup  Vaccinations: Influenza vaccine: due now-declined due to cold symptoms Pneumococcal vaccine: due now-declined due to cold symptoms  Tdap vaccine: due, check with your insurance company for coverage  Shingles vaccine: due, check with your insurance company for coverage  Advanced directives: Advance directive discussed with you today. I have provided a copy for you to complete at home and have notarized. Once this is complete please bring a copy in to our office so we can scan it into your chart.  Conditions/risks identified: Recommend drinking at least 6-8 glasses of water a day   Next appointment: Follow up on 02/01/2017 at 9:00am with Phineas Inchesheryl Wicker,NP. Follow up in one year for your annual wellness exam.    Preventive Care 65 Years and Older, Female Preventive care refers to lifestyle choices and visits with your health care provider that can promote health and wellness. What does preventive care include?  A yearly physical exam. This is also called an annual well check.  Dental exams once or twice a year.  Routine eye exams. Ask your health care provider how often you should have your eyes checked.  Personal lifestyle choices, including:  Daily care of your teeth and gums.  Regular physical activity.  Eating a healthy diet.  Avoiding tobacco and drug use.  Limiting alcohol use.  Practicing safe sex.  Taking low-dose aspirin every  day.  Taking vitamin and mineral supplements as recommended by your health care provider. What happens during an annual well check? The services and screenings done by your health care provider during your annual well check will depend on your age, overall health, lifestyle risk factors, and family history of disease. Counseling  Your health care provider may ask you questions about your:  Alcohol use.  Tobacco use.  Drug use.  Emotional well-being.  Home and relationship well-being.  Sexual activity.  Eating habits.  History of falls.  Memory and ability to understand (cognition).  Work and work Astronomerenvironment.  Reproductive health. Screening  You may have the following tests or measurements:  Height, weight, and BMI.  Blood pressure.  Lipid and cholesterol levels. These may be checked every 5 years, or more frequently if you are over 65 years old.  Skin check.  Lung cancer screening. You may have this screening every year starting at age 65 if you have a 30-pack-year history of smoking and currently smoke or have quit within the past 15 years.  Fecal occult blood test (FOBT) of the stool. You may have this test every year starting at age 65.  Flexible sigmoidoscopy or colonoscopy. You may have a sigmoidoscopy every 5 years or a colonoscopy every 10 years starting at age 65.  Hepatitis C blood test.  Hepatitis B blood test.  Sexually transmitted disease (STD) testing.  Diabetes screening. This is done by checking your blood sugar (glucose) after you have not eaten for a while (fasting). You may have this done every 1-3 years.  Bone density scan. This is done to screen for osteoporosis. You may  have this done starting at age 56.  Mammogram. This may be done every 1-2 years. Talk to your health care provider about how often you should have regular mammograms. Talk with your health care provider about your test results, treatment options, and if necessary, the need  for more tests. Vaccines  Your health care provider may recommend certain vaccines, such as:  Influenza vaccine. This is recommended every year.  Tetanus, diphtheria, and acellular pertussis (Tdap, Td) vaccine. You may need a Td booster every 10 years.  Zoster vaccine. You may need this after age 79.  Pneumococcal 13-valent conjugate (PCV13) vaccine. One dose is recommended after age 56.  Pneumococcal polysaccharide (PPSV23) vaccine. One dose is recommended after age 69. Talk to your health care provider about which screenings and vaccines you need and how often you need them. This information is not intended to replace advice given to you by your health care provider. Make sure you discuss any questions you have with your health care provider. Document Released: 02/28/2015 Document Revised: 10/22/2015 Document Reviewed: 12/03/2014 Elsevier Interactive Patient Education  2017 [place] Prevention in the Home Falls can cause injuries. They can happen to people of all ages. There are many things you can do to make your home safe and to help prevent falls. What can I do on the outside of my home?  Regularly fix the edges of walkways and driveways and fix any cracks.  Remove anything that might make you trip as you walk through a door, such as a raised step or threshold.  Trim any bushes or trees on the path to your home.  Use bright outdoor lighting.  Clear any walking paths of anything that might make someone trip, such as rocks or tools.  Regularly check to see if handrails are loose or broken. Make sure that both sides of any steps have handrails.  Any raised decks and porches should have guardrails on the edges.  Have any leaves, snow, or ice cleared regularly.  Use sand or salt on walking paths during winter.  Clean up any spills in your garage right away. This includes oil or grease spills. What can I do in the bathroom?  Use night lights.  Install grab bars  by the toilet and in the tub and shower. Do not use towel bars as grab bars.  Use non-skid mats or decals in the tub or shower.  If you need to sit down in the shower, use a plastic, non-slip stool.  Keep the floor dry. Clean up any water that spills on the floor as soon as it happens.  Remove soap buildup in the tub or shower regularly.  Attach bath mats securely with double-sided non-slip rug tape.  Do not have throw rugs and other things on the floor that can make you trip. What can I do in the bedroom?  Use night lights.  Make sure that you have a light by your bed that is easy to reach.  Do not use any sheets or blankets that are too big for your bed. They should not hang down onto the floor.  Have a firm chair that has side arms. You can use this for support while you get dressed.  Do not have throw rugs and other things on the floor that can make you trip. What can I do in the kitchen?  Clean up any spills right away.  Avoid walking on wet floors.  Keep items that you use a lot in  easy-to-reach places.  If you need to reach something above you, use a strong step stool that has a grab bar.  Keep electrical cords out of the way.  Do not use floor polish or wax that makes floors slippery. If you must use wax, use non-skid floor wax.  Do not have throw rugs and other things on the floor that can make you trip. What can I do with my stairs?  Do not leave any items on the stairs.  Make sure that there are handrails on both sides of the stairs and use them. Fix handrails that are broken or loose. Make sure that handrails are as long as the stairways.  Check any carpeting to make sure that it is firmly attached to the stairs. Fix any carpet that is loose or worn.  Avoid having throw rugs at the top or bottom of the stairs. If you do have throw rugs, attach them to the floor with carpet tape.  Make sure that you have a light switch at the top of the stairs and the  bottom of the stairs. If you do not have them, ask someone to add them for you. What else can I do to help prevent falls?  Wear shoes that:  Do not have high heels.  Have rubber bottoms.  Are comfortable and fit you well.  Are closed at the toe. Do not wear sandals.  If you use a stepladder:  Make sure that it is fully opened. Do not climb a closed stepladder.  Make sure that both sides of the stepladder are locked into place.  Ask someone to hold it for you, if possible.  Clearly mark and make sure that you can see:  Any grab bars or handrails.  First and last steps.  Where the edge of each step is.  Use tools that help you move around (mobility aids) if they are needed. These include:  Canes.  Walkers.  Scooters.  Crutches.  Turn on the lights when you go into a dark area. Replace any light bulbs as soon as they burn out.  Set up your furniture so you have a clear path. Avoid moving your furniture around.  If any of your floors are uneven, fix them.  If there are any pets around you, be aware of where they are.  Review your medicines with your doctor. Some medicines can make you feel dizzy. This can increase your chance of falling. Ask your doctor what other things that you can do to help prevent falls. This information is not intended to replace advice given to you by your health care provider. Make sure you discuss any questions you have with your health care provider. Document Released: 11/28/2008 Document Revised: 07/10/2015 Document Reviewed: 03/08/2014 Elsevier Interactive Patient Education  2017 [employer]

## 2017-01-31 NOTE — Progress Notes (Signed)
Subjective:   Carol FitchLaverne B [name] is a 66 y.o. female who presents for Medicare Annual (Subsequent) preventive examination.  Review of Systems:   Cardiac Risk Factors include: hypertension;advanced age (>6955men, 10>65 women);dyslipidemia     Objective:     Vitals: BP 118/64 (BP Location: Left Arm, Patient Position: Sitting)   Pulse 64   Temp 97.9 F (36.6 C) (Oral)   Resp 16   Ht 5\' 4"  (1.626 m)   Wt 156 lb (70.8 kg)   LMP  (LMP Unknown)   BMI 26.78 kg/m   Body mass index is 26.78 kg/m.  Advanced Directives 01/31/2017 01/30/2016 01/27/2015  Does Patient Have a Medical Advance Directive? Yes No No  Does patient want to make changes to medical advance directive? Yes (MAU/Ambulatory/Procedural Areas - Information given) - -    Tobacco Social History   Tobacco Use  Smoking Status Never Smoker  Smokeless Tobacco Never Used     Counseling given: Not Answered   Clinical Intake:  Pre-visit preparation completed: Yes  Pain : No/denies pain     Nutritional Status: BMI 25 -29 Overweight Nutritional Risks: None Diabetes: No  How often do you need to have someone help you when you read instructions, pamphlets, or other written materials from your doctor or pharmacy?: 1 - Never What is the last grade level you completed in school?: 12th grade  Interpreter Needed?: No  Information entered by :: Tiffany Hill,LPN   Past Medical History:  Diagnosis Date  . Alkaline phosphatase elevation   . Allergy   . Eczema   . Hypertension   . Muscle cramps   . Osteoarthritis   . Osteoporosis    Past Surgical History:  Procedure Laterality Date  . JOINT REPLACEMENT Right 2013   hip   Family History  Problem Relation Age of Onset  . Cancer Mother   . CAD Mother   . Hypertension Mother   . Cancer Father        throat and lung  . Hypertension Brother   . Hypertension Daughter   . Hypertension Son   . Hypertension Brother   . Hypertension Brother   . Breast cancer Neg Hx     Social History   Socioeconomic History  . Marital status: Married    Spouse name: None  . Number of children: None  . Years of education: 6212  . Highest education level: 12th grade  Social Needs  . Financial resource strain: Not very hard  . Food insecurity - worry: Never true  . Food insecurity - inability: Never true  . Transportation needs - medical: No  . Transportation needs - non-medical: No  Occupational History  . None  Tobacco Use  . Smoking status: Never Smoker  . Smokeless tobacco: Never Used  Substance and Sexual Activity  . Alcohol use: No    Alcohol/week: 0.0 oz  . Drug use: No  . Sexual activity: Yes  Other Topics Concern  . None  Social History Narrative  . None    Outpatient Encounter Medications as of 01/31/2017  Medication Sig  . amLODipine (NORVASC) 5 MG tablet TAKE 1 TABLET BY MOUTH EVERY DAY  . aspirin 81 MG tablet Take 81 mg by mouth daily.  . fluticasone (FLONASE) 50 MCG/ACT nasal spray PLACE 2 SPRAYS INTO BOTH NOSTRILS DAILY  . loratadine (CLARITIN) 10 MG tablet Take 10 mg by mouth daily.  . meclizine (ANTIVERT) 25 MG tablet Take 1 tablet (25 mg total) by mouth 3 (three) times  daily as needed for dizziness.  . meloxicam (MOBIC) 15 MG tablet TAKE 1 TABLET BY MOUTH EVERY DAY.  [name] triamcinolone cream (KENALOG) 0.1 % Apply 1 application topically 2 (two) times daily. (Patient not taking: Reported on 01/31/2017)  . [DISCONTINUED] predniSONE (DELTASONE) 10 MG tablet Take 6 tablets daily x 2 days, 5 tablets daily x 2 days, 4 tablets daily x 2 days, etc (Patient not taking: Reported on 01/31/2017)   No facility-administered encounter medications on file as of 01/31/2017.     Activities of Daily Living In your present state of health, do you have any difficulty performing the following activities: 01/31/2017  Hearing? N  Vision? N  Difficulty concentrating or making decisions? N  Walking or climbing stairs? N  Dressing or bathing? N  Doing errands,  shopping? N  Preparing Food and eating ? N  Using the Toilet? N  In the past six months, have you accidently leaked urine? N  Do you have problems with loss of bowel control? N  Managing your Medications? N  Managing your Finances? N  Housekeeping or managing your Housekeeping? N  Some recent data might be hidden    Patient Care Team: Gabriel Cirri, NP as PCP - General (Nurse Practitioner) Donato Heinz, MD (Orthopedic Surgery)    Assessment:   This is a routine wellness examination for Carol [name].  Exercise Activities and Dietary recommendations Current Exercise Habits: The patient does not participate in regular exercise at present, Exercise limited by: None identified  Goals    . DIET - INCREASE WATER INTAKE     Recommend drinking at least 6-8 glasses of water a day        Fall Risk Fall Risk  01/31/2017 01/30/2016 01/27/2015  Falls in the past year? Yes No No  Number falls in past yr: 1 - -  Injury with Fall? No - -  Follow up Falls prevention discussed - -   Is the patient's home free of loose throw rugs in walkways, pet beds, electrical cords, etc?   yes      Grab bars in the bathroom? yes      Handrails on the stairs?   yes      Adequate lighting?   yes  Timed Get Up and Go performed: completed in 7 seconds with no used of assistive devices, steady gait. No intervention needed at this time.   Depression Screen PHQ 2/9 Scores 01/31/2017 01/30/2016 01/27/2015  PHQ - 2 Score 0 0 0  PHQ- 9 Score - 0 -     Cognitive Function     6CIT Screen 01/31/2017  What Year? 0 points  What month? 0 points  What time? 0 points  Count back from 20 0 points  Months in reverse 0 points  Repeat phrase 0 points  Total Score 0    Immunization History  Administered Date(s) Administered  . Influenza,inj,Quad PF,6+ Mos 01/27/2015, 02/13/2016  . Td 11/17/2004    Qualifies for Shingles Vaccine?due, discussed options  Screening Tests Health Maintenance  Topic Date Due    . COLONOSCOPY  05/24/2001  . PNA vac Low Risk Adult (1 of 2 - PCV13) 05/24/2016  . INFLUENZA VACCINE  09/15/2016  . TETANUS/TDAP  01/31/2018 (Originally 11/18/2014)  . MAMMOGRAM  02/01/2018  . PAP SMEAR  02/13/2019  . DEXA SCAN  Completed  . Hepatitis C Screening  Completed  . HIV Screening  Completed    Cancer Screenings: Lung: Low Dose CT Chest recommended if Age 51-80 years,  30 pack-year currently smoking OR have quit w/in 15years. Patient does not qualify. Breast:  Up to date on Mammogram? Yes   Up to date of Bone Density/Dexa? Yes Colorectal: due   Additional Screenings:  Hepatitis B/HIV/Syphillis:compleyed 01/27/2015 Hepatitis C Screening: completed 01/27/2015     Plan:    I have personally reviewed and addressed the Medicare Annual Wellness questionnaire and have noted the following in the patient's chart:  A. Medical and social history B. Use of alcohol, tobacco or illicit drugs  C. Current medications and supplements D. Functional ability and status E.  Nutritional status F.  Physical activity G. Advance directives H. List of other physicians I.  Hospitalizations, surgeries, and ER visits in previous 12 months J.  Vitals K. Screenings such as hearing and vision if needed, cognitive and depression L. Referrals and appointments   In addition, I have reviewed and discussed with patient certain preventive protocols, quality metrics, and best practice recommendations. A written personalized care plan for preventive services as well as general preventive health recommendations were provided to patient.   Signed,  Marin Robertsiffany Hill, LPN Nurse Health Advisor   Nurse Notes: declined flu vaccine/pneumonia vaccine today due to cold-like symptoms.  Requests refill on triamcinoclone cream.

## 2017-02-01 ENCOUNTER — Ambulatory Visit (INDEPENDENT_AMBULATORY_CARE_PROVIDER_SITE_OTHER): Payer: Medicare Other | Admitting: Unknown Physician Specialty

## 2017-02-01 ENCOUNTER — Encounter: Payer: Self-pay | Admitting: Unknown Physician Specialty

## 2017-02-01 VITALS — BP 120/77 | HR 78 | Temp 97.4°F | Ht 64.0 in | Wt 156.0 lb

## 2017-02-01 DIAGNOSIS — B351 Tinea unguium: Secondary | ICD-10-CM | POA: Diagnosis not present

## 2017-02-01 DIAGNOSIS — Z7189 Other specified counseling: Secondary | ICD-10-CM | POA: Diagnosis not present

## 2017-02-01 DIAGNOSIS — I10 Essential (primary) hypertension: Secondary | ICD-10-CM

## 2017-02-01 DIAGNOSIS — R05 Cough: Secondary | ICD-10-CM | POA: Diagnosis not present

## 2017-02-01 DIAGNOSIS — Z0000 Encounter for general adult medical examination without abnormal findings: Secondary | ICD-10-CM | POA: Diagnosis not present

## 2017-02-01 DIAGNOSIS — R059 Cough, unspecified: Secondary | ICD-10-CM

## 2017-02-01 MED ORDER — GUAIFENESIN-CODEINE 100-10 MG/5ML PO SOLN: 10.0000 mL | Freq: Three times a day (TID) | ORAL | 0 refills | Status: DC | PRN

## 2017-02-01 MED ORDER — TERBINAFINE HCL 250 MG PO TABS: 250.0000 mg | ORAL_TABLET | Freq: Every day | ORAL | 0 refills | Status: DC

## 2017-02-01 NOTE — Assessment & Plan Note (Signed)
Stable, continue present medications.   

## 2017-02-01 NOTE — Progress Notes (Signed)
BP 120/77 (BP Location: Left Arm, Cuff Size: Normal)   Pulse 78   Temp (!) 97.4 F (36.3 C) (Oral)   Ht 5\' 4"  (1.626 m)   Wt 156 lb (70.8 kg)   LMP  (LMP Unknown)   SpO2 97%   BMI 26.78 kg/m    Subjective:    Patient ID: Carol [name], female    DOB: 10/24/1950, 66 y.o.   MRN: [id number]  HPI: Carol [name] is a 66 y.o. female  Chief Complaint  Patient presents with  . Annual Exam    pt had wellness exam yesterday with [facility]  . Cough    pt states she has had a cough for over a week  . Nail Problem    pt states her big toenail on her left foot has turned black    Onychomycosis Pt states her left large toe is dark and discolored.  No pain  Cough  This is a new problem. Episode onset: 8 days. The problem has been gradually worsening. The problem occurs constantly. The cough is productive of purulent sputum. Associated symptoms include nasal congestion. Pertinent negatives include no chest pain, chills, ear congestion, ear pain, fever, headaches, heartburn, hemoptysis, myalgias, postnasal drip, rash, shortness of breath, sweats, weight loss or wheezing. The symptoms are aggravated by lying down. She has tried nothing for the symptoms.   Hypertension Using medications without difficulty Average home BPs Not checking   No problems or lightheadedness No chest pain with exertion or shortness of breath No Edema   Relevant past medical, surgical, family and social history reviewed and updated as indicated. Interim medical history since our last visit reviewed. Allergies and medications reviewed and updated.  Review of Systems  Constitutional: Negative for chills, fever and weight loss.  HENT: Negative for ear pain and postnasal drip.   Respiratory: Positive for cough. Negative for hemoptysis, shortness of breath and wheezing.   Cardiovascular: Negative for chest pain.  Gastrointestinal: Negative for heartburn.  Musculoskeletal: Negative for myalgias.  Skin: Negative for  rash.  Neurological: Negative for headaches.    Per HPI unless specifically indicated above     Objective:    BP 120/77 (BP Location: Left Arm, Cuff Size: Normal)   Pulse 78   Temp (!) 97.4 F (36.3 C) (Oral)   Ht 5\' 4"  (1.626 m)   Wt 156 lb (70.8 kg)   LMP  (LMP Unknown)   SpO2 97%   BMI 26.78 kg/m   Wt Readings from Last 3 Encounters:  02/01/17 156 lb (70.8 kg)  01/31/17 156 lb (70.8 kg)  10/28/16 155 lb 3.2 oz (70.4 kg)    Physical Exam  Constitutional: She is oriented to person, place, and time. She appears well-developed and well-nourished.  HENT:  Head: Normocephalic and atraumatic.  Eyes: Pupils are equal, round, and reactive to light. Right eye exhibits no discharge. Left eye exhibits no discharge. No scleral icterus.  Neck: Normal range of motion. Neck supple. Carotid bruit is not present. No thyromegaly present.  Cardiovascular: Normal rate, regular rhythm and normal heart sounds. Exam reveals no gallop and no friction rub.  No murmur heard. Pulmonary/Chest: Effort normal and breath sounds normal. No respiratory distress. She has no wheezes. She has no rales.  Abdominal: Soft. Bowel sounds are normal. There is no tenderness. There is no rebound.  Genitourinary: No breast swelling, tenderness or discharge.  Musculoskeletal: Normal range of motion.  Lymphadenopathy:    She has no cervical adenopathy.  Neurological:  She is alert and oriented to person, place, and time.  Skin: Skin is warm, dry and intact. No rash noted.  Left large toenail darkening and thick.  No erythema  Psychiatric: She has a normal mood and affect. Her speech is normal and behavior is normal. Judgment and thought content normal. Cognition and memory are normal.    Results for orders placed or performed in visit on 10/11/16  [facility]apid [facility] screen ([facility])  Result Value Ref Range   Strep Gp A Ag, IA W/Reflex Negative Negative  Culture, Group A Strep  Result Value Ref Range   Strep A  Culture Negative       Assessment & Plan:   Problem List Items Addressed This Visit      Unprioritized   Advanced care planning/counseling discussion    A voluntary discussion about advance care planning including the explanation and discussion of advance directives was extensively discussed  with the patient.  Explanation about the health care proxy and Living will was reviewed and packet with forms with explanation of how to fill them out was given.  During this discussion, the patient was able to identify a health care proxy as her daughter and plans to fill out the paperwork required.  Patient was offered a separate Advance Care Planning visit for further assistance with forms.         Hypertension    Stable, continue present medications.        Relevant Orders   Comprehensive metabolic panel   Lipid Panel w/o Chol/HDL Ratio   Onychomycosis of toenail    Rx for Lamisil for 3 months.  Discussed with pt toenail takes a year to grow out      Relevant Medications   terbinafine (LAMISIL) 250 MG tablet    Other Visit Diagnoses    Cough    -  Primary   New problem.  I suspect viral source.  Rx for Robitussin with codeine   Annual physical exam          Health maintenance: Colon cancer screening:  Doing yearly stool card testing.  Does this through insurance.  Refuses colonoscopy Mammogram: tomorrow AM    Follow up plan: Return in about 6 months (around 08/02/2017).

## 2017-02-01 NOTE — Assessment & Plan Note (Signed)
A voluntary discussion about advance care planning including the explanation and discussion of advance directives was extensively discussed  with the patient.  Explanation about the health care proxy and Living will was reviewed and packet with forms with explanation of how to fill them out was given.  During this discussion, the patient was able to identify a health care proxy as her daughter and plans to fill out the paperwork required.  Patient was offered a separate Advance Care Planning visit for further assistance with forms.    

## 2017-02-01 NOTE — Assessment & Plan Note (Signed)
Rx for Lamisil for 3 months.  Discussed with pt toenail takes a year to grow out

## 2017-02-02 ENCOUNTER — Ambulatory Visit
Admission: RE | Admit: 2017-02-02 | Discharge: 2017-02-02 | Disposition: A | Discharge status: Home / Self Care | Payer: Medicare Other | Source: Ambulatory Visit | Attending: Unknown Physician Specialty | Admitting: Unknown Physician Specialty

## 2017-02-02 ENCOUNTER — Encounter: Payer: Self-pay | Admitting: Unknown Physician Specialty

## 2017-02-02 DIAGNOSIS — Z1231 Encounter for screening mammogram for malignant neoplasm of breast: Secondary | ICD-10-CM | POA: Diagnosis not present

## 2017-02-02 LAB — COMPREHENSIVE METABOLIC PANEL
ALBUMIN: 4.1 g/dL (ref 3.6–4.8)
ALK PHOS: 124 IU/L — AB (ref 39–117)
ALT: 9 IU/L (ref 0–32)
AST: 14 IU/L (ref 0–40)
Albumin/Globulin Ratio: 1.1 — ABNORMAL LOW (ref 1.2–2.2)
BUN / CREAT RATIO: 14 (ref 12–28)
BUN: 11 mg/dL (ref 8–27)
Bilirubin Total: 0.3 mg/dL (ref 0.0–1.2)
CO2: 26 mmol/L (ref 20–29)
CREATININE: 0.78 mg/dL (ref 0.57–1.00)
Calcium: 9.7 mg/dL (ref 8.7–10.3)
Chloride: 101 mmol/L (ref 96–106)
GFR calc Af Amer: 92 mL/min/{1.73_m2} (ref >59)
GFR calc non Af Amer: 80 mL/min/{1.73_m2} (ref >59)
GLUCOSE: 105 mg/dL — AB (ref 65–99)
Globulin, Total: 3.6 g/dL (ref 1.5–4.5)
Potassium: 3.9 mmol/L (ref 3.5–5.2)
Sodium: 139 mmol/L (ref 134–144)
Total Protein: 7.7 g/dL (ref 6.0–8.5)

## 2017-02-02 LAB — LIPID PANEL W/O CHOL/HDL RATIO
CHOLESTEROL TOTAL: 189 mg/dL (ref 100–199)
HDL: 43 mg/dL (ref >39)
LDL CALC: 126 mg/dL — AB (ref 0–99)
TRIGLYCERIDES: 98 mg/dL (ref 0–149)
VLDL CHOLESTEROL CAL: 20 mg/dL (ref 5–40)

## 2017-02-11 ENCOUNTER — Ambulatory Visit: Payer: Self-pay

## 2017-02-11 NOTE — Telephone Encounter (Signed)
Yes, stop taking Lamisil.  Once the itching has improved, we can give her something else

## 2017-02-11 NOTE — Telephone Encounter (Signed)
Patient called in with c/o "itching and headache from this new medicine for my toe that I was prescribed." When asked does she have a rash, she stated "no." Patient refused to be triaged, stated "I'm at work and don't have time to answer questions. Contact the nurse and have her to call me back at home this afternoon letting me know if I should stop taking it." Patient made aware that the note will be sent. Last OV 02/01/17, medication is Lamisil and was prescribed on 02/01/17 for Onychomycosis of toenail.

## 2017-02-11 NOTE — Telephone Encounter (Signed)
Called to speak with patient but she was not in. Gave the message to the patient's husband. Will call to speak with patient again on Monday.

## 2017-02-11 NOTE — Telephone Encounter (Signed)
Routing to provider for advice.

## 2017-02-14 NOTE — Telephone Encounter (Signed)
Called and spoke to patient. She states that the itching did stop and she would like something else sent in to the pharmacy.

## 2017-02-16 MED ORDER — ITRACONAZOLE 100 MG PO CAPS: 100.0000 mg | ORAL_CAPSULE | Freq: Two times a day (BID) | ORAL | 0 refills | Status: DC

## 2017-02-16 NOTE — Addendum Note (Signed)
Addended by: Gabriel CirriWICKER, Alan Drummer on: 02/16/2017 08:06 AM   Modules accepted: Orders

## 2017-02-17 ENCOUNTER — Telehealth: Payer: Self-pay

## 2017-02-17 NOTE — Telephone Encounter (Signed)
PA was submitted and denied for patient's itraconazole RX because it requires that you have a diagnosis of fingernail onychomycosis confirmed by one of the following KOH (potassium hydroxide) test, fungal culture, or nail biopsy.   Is there a different medication we can send in for the patient? No alternatives were suggested on the fax.

## 2017-02-21 MED ORDER — FLUCONAZOLE 150 MG PO TABS: 150.0000 mg | ORAL_TABLET | ORAL | 0 refills | Status: DC

## 2017-02-21 NOTE — Telephone Encounter (Signed)
Fluconazole weekly is a possibility.  Rx in chart

## 2017-02-21 NOTE — Telephone Encounter (Signed)
Tried calling patient to let her know about medication. Patient did not answer and VM box was full. Will try to call again in the morning.

## 2017-02-22 NOTE — Telephone Encounter (Signed)
Patient notified about medication.  

## 2017-02-22 NOTE — Telephone Encounter (Signed)
Called and spoke to patient's husband. Husband stated that the patient was not in. Asked for the patient to call me back when she gets in.

## 2017-04-11 ENCOUNTER — Encounter: Payer: Self-pay | Admitting: Unknown Physician Specialty

## 2017-04-15 ENCOUNTER — Other Ambulatory Visit: Payer: Self-pay | Admitting: Unknown Physician Specialty

## 2017-06-29 ENCOUNTER — Ambulatory Visit: Payer: Self-pay | Admitting: *Deleted

## 2017-06-29 NOTE — Telephone Encounter (Signed)
Patient is calling to report that she is having dizziness when she bends over- she states she wants an appointment for evaluation of this- patient states she was given medication for this a year ago that seemed to help. Patient has not been evaluated recently for this. Patient did not want to participate in her triage and found it difficult to describe her symptoms. She did answer questions briefly.  Reason for Disposition . [1] MILD dizziness (e.g., walking normally) AND [2] has NOT been evaluated by physician for this  (Exception: dizziness caused by heat exposure, sudden standing, or poor fluid intake)  Answer Assessment - Initial Assessment Questions 1. DESCRIPTION: "Describe your dizziness."     Patient gets dizzy when she bends over 2. LIGHTHEADED: "Do you feel lightheaded?" (e.g., somewhat faint, woozy, weak upon standing)     Dizzy/ spinney takes a few minutes- headache today and yesterday- tylenol seemed to help 3. VERTIGO: "Do you feel like either you or the room is spinning or tilting?" (i.e. vertigo)     no 4. SEVERITY: "How bad is it?"  "Do you feel like you are going to faint?" "Can you stand and walk?"   - MILD - walking normally   - MODERATE - interferes with normal activities (e.g., work, school)    - SEVERE - unable to stand, requires support to walk, feels like passing out now.      Not every time she bends over- mild 5. ONSET:  "When did the dizziness begin?"     Yesterday and today 6. AGGRAVATING FACTORS: "Does anything make it worse?" (e.g., standing, change in head position)     Bending down 7. HEART RATE: "Can you tell me your heart rate?" "How many beats in 15 seconds?"  (Note: not all patients can do this)       No change in heart rate 8. CAUSE: "What do you think is causing the dizziness?"     unsure 9. RECURRENT SYMPTOM: "Have you had dizziness before?" If so, ask: "When was the last time?" "What happened that time?"     Been a while since it has happen- patient  was given medication- seemed to help 10. OTHER SYMPTOMS: "Do you have any other symptoms?" (e.g., fever, chest pain, vomiting, diarrhea, bleeding)       no 11. PREGNANCY: "Is there any chance you are pregnant?" "When was your last menstrual period?"       n/a  Protocols used: DIZZINESS [facility]

## 2017-07-01 ENCOUNTER — Ambulatory Visit (INDEPENDENT_AMBULATORY_CARE_PROVIDER_SITE_OTHER): Payer: Medicare Other | Admitting: Unknown Physician Specialty

## 2017-07-01 ENCOUNTER — Encounter: Payer: Self-pay | Admitting: Unknown Physician Specialty

## 2017-07-01 VITALS — BP 112/70 | HR 76 | Temp 97.6°F | Ht 64.0 in | Wt 155.6 lb

## 2017-07-01 DIAGNOSIS — R42 Dizziness and giddiness: Secondary | ICD-10-CM

## 2017-07-01 DIAGNOSIS — M1711 Unilateral primary osteoarthritis, right knee: Secondary | ICD-10-CM | POA: Diagnosis not present

## 2017-07-01 NOTE — Assessment & Plan Note (Addendum)
Known OA in this knee worsenin.  Symptoms consistant with OA rather than traumatic injury.  Discussed activity and Tylenol for pain

## 2017-07-01 NOTE — Progress Notes (Signed)
BP 112/70   Pulse 76   Temp 97.6 F (36.4 C) (Oral)   Ht  (1.626 m)   Wt 155 lb 9.6 oz (70.6 kg)   LMP  (LMP Unknown)   SpO2 98%   BMI 26.71 kg/m    Subjective:    Patient ID: Carol [name], female    DOB: October 21, 1950, 67 y.o.   MRN: [id number]  HPI: Carol [name] is a 67 y.o. female  Chief Complaint  Patient presents with  . Dizziness    pt states she has been experiencing dizziness when she bends over, has meclizine but states she has not been taking it    Dizziness  Chronicity: recurrent. Episode onset: 2 days. Episode frequency: when bends over. Pertinent negatives include no abdominal pain, fatigue, fever, nausea, numbness or vomiting. The symptoms are aggravated by bending. She has tried nothing (Has some Meclizine but hasn't started it) for the symptoms.  Knee Pain   Incident onset: a month ago. The incident occurred at home. The injury mechanism was a fall. The pain is present in the right knee. The quality of the pain is described as aching. The pain is moderate. The pain has been fluctuating since onset. Pertinent negatives include no inability to bear weight, loss of motion, loss of sensation, muscle weakness, numbness or tingling. She reports no foreign bodies present. Nothing aggravates the symptoms. Treatments tried: Meloxicam. The treatment provided no relief.     Relevant past medical, surgical, family and social history reviewed and updated as indicated. Interim medical history since our last visit reviewed. Allergies and medications reviewed and updated.  Review of Systems  Constitutional: Negative for fatigue and fever.  Gastrointestinal: Negative for abdominal pain, nausea and vomiting.  Neurological: Positive for dizziness. Negative for tingling and numbness.    Per HPI unless specifically indicated above     Objective:    BP 112/70   Pulse 76   Temp 97.6 F (36.4 C) (Oral)   Ht  (1.626 m)   Wt 155 lb 9.6 oz (70.6 kg)   LMP  (LMP  Unknown)   SpO2 98%   BMI 26.71 kg/m   Wt Readings from Last 3 Encounters:  07/01/17 155 lb 9.6 oz (70.6 kg)  02/01/17 156 lb (70.8 kg)  01/31/17 156 lb (70.8 kg)    Physical Exam  Constitutional: She is oriented to person, place, and time. She appears well-developed and well-nourished. No distress.  HENT:  Head: Normocephalic and atraumatic.  Eyes: Conjunctivae and lids are normal. Right eye exhibits no discharge. Left eye exhibits no discharge. No scleral icterus.  Neck: Normal range of motion. Neck supple. No JVD present. Carotid bruit is not present.  Cardiovascular: Normal rate, regular rhythm and normal heart sounds.  Pulmonary/Chest: Effort normal and breath sounds normal.  Abdominal: Normal appearance. There is no splenomegaly or hepatomegaly.  Musculoskeletal: Normal range of motion.  Neurological: She is alert and oriented to person, place, and time.  Skin: Skin is warm, dry and intact. No rash noted. No pallor.  Psychiatric: She has a normal mood and affect. Her behavior is normal. Judgment and thought content normal.    Results for orders placed or performed in visit on 02/01/17  Comprehensive metabolic panel  Result Value Ref Range   Glucose 105 (H) 65 - 99 mg/dL   BUN 11 8 - 27 mg/dL   Creatinine, Ser 8.46 0.57 - 1.00 mg/dL   GFR calc non Af Amer 80 >59 mL/min/1.73  GFR calc Af Amer 92 >59 mL/min/1.73   BUN/Creatinine Ratio 14 12 - 28   Sodium 139 134 - 144 mmol/L   Potassium 3.9 3.5 - 5.2 mmol/L   Chloride 101 96 - 106 mmol/L   CO2 26 20 - 29 mmol/L   Calcium 9.7 8.7 - 10.3 mg/dL   Total Protein 7.7 6.0 - 8.5 g/dL   Albumin 4.1 3.6 - 4.8 g/dL   Globulin, Total 3.6 1.5 - 4.5 g/dL   Albumin/Globulin Ratio 1.1 (L) 1.2 - 2.2   Bilirubin Total 0.3 0.0 - 1.2 mg/dL   Alkaline Phosphatase 124 (H) 39 - 117 IU/L   AST 14 0 - 40 IU/L   ALT 9 0 - 32 IU/L  Lipid Panel w/o Chol/HDL Ratio  Result Value Ref Range   Cholesterol, Total 189 100 - 199 mg/dL    Triglycerides 98 0 - 149 mg/dL   HDL 43 >40 mg/dL   VLDL Cholesterol Cal 20 5 - 40 mg/dL   LDL Calculated 981 (H) 0 - 99 mg/dL      Assessment & Plan:   Problem List Items Addressed This Visit      Unprioritized   Osteoarthritis of right knee    Known OA in this knee worsenin.  Symptoms consistant with OA rather than traumatic injury.  Discussed activity and Tylenol for pain       Other Visit Diagnoses    Vertigo    -  Primary   New but recurrent problem.  Has Meclizine. Discussed osition changes and judicious use of Meclizine.         Follow up plan: Return if symptoms worsen or fail to improve.

## 2017-08-07 ENCOUNTER — Encounter: Payer: Self-pay | Admitting: Emergency Medicine

## 2017-08-07 ENCOUNTER — Emergency Department
Admission: EM | Admit: 2017-08-07 | Discharge: 2017-08-07 | Disposition: A | Discharge status: Home / Self Care | Payer: Medicare Other | Attending: Emergency Medicine | Admitting: Emergency Medicine

## 2017-08-07 ENCOUNTER — Emergency Department: Payer: Medicare Other

## 2017-08-07 DIAGNOSIS — X501XXA Overexertion from prolonged static or awkward postures, initial encounter: Secondary | ICD-10-CM | POA: Insufficient documentation

## 2017-08-07 DIAGNOSIS — Z7982 Long term (current) use of aspirin: Secondary | ICD-10-CM | POA: Insufficient documentation

## 2017-08-07 DIAGNOSIS — S92901A Unspecified fracture of right foot, initial encounter for closed fracture: Secondary | ICD-10-CM

## 2017-08-07 DIAGNOSIS — Y9301 Activity, walking, marching and hiking: Secondary | ICD-10-CM | POA: Insufficient documentation

## 2017-08-07 DIAGNOSIS — Y999 Unspecified external cause status: Secondary | ICD-10-CM | POA: Insufficient documentation

## 2017-08-07 DIAGNOSIS — Z96641 Presence of right artificial hip joint: Secondary | ICD-10-CM | POA: Diagnosis not present

## 2017-08-07 DIAGNOSIS — Y9289 Other specified places as the place of occurrence of the external cause: Secondary | ICD-10-CM | POA: Diagnosis not present

## 2017-08-07 DIAGNOSIS — S92354A Nondisplaced fracture of fifth metatarsal bone, right foot, initial encounter for closed fracture: Secondary | ICD-10-CM | POA: Insufficient documentation

## 2017-08-07 DIAGNOSIS — S99921A Unspecified injury of right foot, initial encounter: Secondary | ICD-10-CM | POA: Diagnosis present

## 2017-08-07 DIAGNOSIS — Z79899 Other long term (current) drug therapy: Secondary | ICD-10-CM | POA: Insufficient documentation

## 2017-08-07 NOTE — ED Provider Notes (Signed)
[facility] Emergency Department Provider Note ____________________________________________  Time seen: 1505  I have reviewed the triage vital signs and the nursing notes.  HISTORY  Chief Complaint  Foot Pain  HPI Carol [name] is a 67 y.o. female presented to the ED accompanied by her family member, for evaluation of pain to the lateral right foot.  Patient describes walking up a hill at home yesterday when she apparently turned her foot and an inversion type mechanism.  Since that time she had immediate pain, swelling, disability with walking.  She presents today because of increased swelling to the lateral aspect of the right foot upon awakening.  She also reports that she cannot put on a regular shoe due to the swelling and discomfort.  She denies any significant pain at rest.  No other injuries are reported.  Past Medical History:  Diagnosis Date  . Alkaline phosphatase elevation   . Allergy   . Eczema   . Hypertension   . Muscle cramps   . Osteoarthritis   . Osteoporosis     Patient Active Problem List   Diagnosis Date Noted  . Osteoarthritis of right knee 07/01/2017  . Onychomycosis of toenail 02/01/2017  . Advanced care planning/counseling discussion 02/01/2017  . Osteoarthritis of left knee 03/26/2016  . Muscle cramps 01/13/2015  . Osteoporosis 01/13/2015  . Hypertension 01/13/2015  . Osteoarthritis of hip 01/13/2015  . Allergic rhinitis 01/13/2015  . Osteoarthritis 01/13/2015  . Alkaline phosphatase elevation 01/13/2015  . Eczema 01/13/2015    Past Surgical History:  Procedure Laterality Date  . JOINT REPLACEMENT Right 2013   hip    Prior to Admission medications   Medication Sig Start Date End Date Taking? Authorizing Provider  amLODipine (NORVASC) 5 MG tablet TAKE 1 TABLET BY MOUTH EVERY DAY 01/19/17   Gabriel CirriWicker, Cheryl, NP  aspirin 81 MG tablet Take 81 mg by mouth daily.    [provider]  fluconazole (DIFLUCAN) 150 MG  tablet Take 1 tablet (150 mg total) by mouth once a week. Patient not taking: Reported on 07/01/2017 02/21/17   Gabriel CirriWicker, Cheryl, NP  fluticasone [facility](FLONASE) 50 MCG/ACT nasal spray PLACE 2 SPRAYS INTO BOTH NOSTRILS DAILY 07/30/16   Gabriel CirriWicker, Cheryl, NP  itraconazole (SPORANOX) 100 MG capsule Take 1 capsule (100 mg total) by mouth 2 (two) times daily. Patient not taking: Reported on 07/01/2017 02/16/17   Gabriel CirriWicker, Cheryl, NP  loratadine (CLARITIN) 10 MG tablet Take 10 mg by mouth daily.    [provider]  meclizine (ANTIVERT) 25 MG tablet Take 1 tablet (25 mg total) by mouth 3 (three) times daily as needed for dizziness. 12/23/15   Gabriel CirriWicker, Cheryl, NP  meloxicam (MOBIC) 15 MG tablet TAKE 1 TABLET BY MOUTH EVERY DAY 04/15/17   Gabriel CirriWicker, Cheryl, NP  triamcinolone cream (KENALOG) 0.1 % Apply 1 application topically 2 (two) times daily. Patient not taking: Reported on 01/31/2017 10/28/16   Particia NearingLane, Rachel Elizabeth, PA-C    Allergies Patient has no known allergies.  Family History  Problem Relation Age of Onset  . Cancer Mother   . CAD Mother   . Hypertension Mother   . Cancer Father        throat and lung  . Hypertension Brother   . Hypertension Daughter   . Hypertension Son   . Hypertension Brother   . Hypertension Brother   . Breast cancer Neg Hx     Social History Social History   Tobacco Use  . Smoking status: Never Smoker  .  Smokeless tobacco: Never Used  Substance Use Topics  . Alcohol use: No    Alcohol/week: 0.0 oz  . Drug use: No    Review of Systems  Constitutional: Negative for fever. Cardiovascular: Negative for chest pain. Respiratory: Negative for shortness of breath. Musculoskeletal: Negative for back pain.  Foot pain as above. Skin: Negative for rash. Neurological: Negative for headaches, focal weakness or numbness. ____________________________________________  PHYSICAL EXAM:  VITAL SIGNS: ED Triage Vitals  Enc Vitals Group     BP 08/07/17 1419 137/71     Pulse  Rate 08/07/17 1419 91     Resp 08/07/17 1419 18     Temp 08/07/17 1419 97.9 F (36.6 C)     Temp Source 08/07/17 1419 Oral     SpO2 08/07/17 1419 99 %     Weight 08/07/17 1414 140 lb (63.5 kg)     Height 08/07/17 1414 5\' 3"  (1.6 m)     Head Circumference --      Peak Flow --      Pain Score 08/07/17 1414 7     Pain Loc --      Pain Edu? --      Excl. in GC? --     Constitutional: Alert and oriented. Well appearing and in no distress. Head: Normocephalic and atraumatic. Cardiovascular: Normal rate, regular rhythm. Normal distal pulses. Respiratory: Normal respiratory effort.  Musculoskeletal: Right foot with obvious dorsal lateral soft tissue swelling.  Patient with tenderness palpation of lateral aspect of the foot at the fifth metatarsal base.  Normal ankle exam without tenderness or disability. Nontender with normal range of motion in all other extremities.  Neurologic: Antalgic gait without ataxia. Normal speech and language. No gross focal neurologic deficits are appreciated. Skin:  Skin is warm, dry and intact. No rash noted. ____________________________________________   RADIOLOGY  Right Foot Negative ____________________________________________  PROCEDURES  Procedures Post-op Shoe ____________________________________________  INITIAL IMPRESSION / ASSESSMENT AND PLAN / ED COURSE  Patient with initial ED management of a closed nondisplaced fifth metatarsal fracture on the right foot.  She is placed in a postop shoe for comfort.  Patient is referred to podiatry for further fracture care management.  She will dose her home medications which include Tylenol and meloxicam.  Return precautions have been reviewed. ____________________________________________  FINAL CLINICAL IMPRESSION(S) / ED DIAGNOSES  Final diagnoses:  Foot fracture, right, closed, initial encounter  Closed nondisplaced fracture of fifth metatarsal bone of right foot, initial encounter      Lissa Hoard, PA-C 08/07/17 1622    Don Perking, [place], MD 08/09/17 2039

## 2017-08-07 NOTE — Discharge Instructions (Addendum)
You have fractured the base of your pinky toe bone, in the foot. Wear the post-op shoe when walking. Rest with the foot elevated and apply ice to reduce swelling. Take your home medicines for pain and inflammation. Follow-up with Dr. Alberteen Spindleline for further fracture care.

## 2017-08-07 NOTE — ED Triage Notes (Signed)
Patient presents to the ED with right foot pain and swelling since yesterday.  Patient reports she was walking up a hill and her foot turned to the side.  Patient states, "I can't get my shoe on."

## 2017-08-16 ENCOUNTER — Encounter: Payer: Medicare Other | Admitting: Unknown Physician Specialty

## 2017-08-20 ENCOUNTER — Other Ambulatory Visit: Payer: Self-pay | Admitting: Unknown Physician Specialty

## 2017-09-13 ENCOUNTER — Encounter: Payer: Self-pay | Admitting: Unknown Physician Specialty

## 2017-09-13 ENCOUNTER — Ambulatory Visit (INDEPENDENT_AMBULATORY_CARE_PROVIDER_SITE_OTHER): Payer: Medicare Other | Admitting: Unknown Physician Specialty

## 2017-09-13 DIAGNOSIS — S92354D Nondisplaced fracture of fifth metatarsal bone, right foot, subsequent encounter for fracture with routine healing: Secondary | ICD-10-CM | POA: Diagnosis not present

## 2017-09-13 DIAGNOSIS — I10 Essential (primary) hypertension: Secondary | ICD-10-CM | POA: Diagnosis not present

## 2017-09-13 DIAGNOSIS — E785 Hyperlipidemia, unspecified: Secondary | ICD-10-CM | POA: Diagnosis not present

## 2017-09-13 DIAGNOSIS — S92353A Displaced fracture of fifth metatarsal bone, unspecified foot, initial encounter for closed fracture: Secondary | ICD-10-CM | POA: Insufficient documentation

## 2017-09-13 MED ORDER — AMLODIPINE BESYLATE 5 MG PO TABS: 5.0000 mg | ORAL_TABLET | Freq: Every day | ORAL | 1 refills | Status: DC

## 2017-09-13 NOTE — Assessment & Plan Note (Signed)
Discussed ASCVD risk and not wanting to start a statin.  However, when her 5th metatarsal fracture is healed she will consider.

## 2017-09-13 NOTE — Progress Notes (Signed)
BP 123/78 (BP Location: Left Arm, Cuff Size: Normal)   Pulse 72   Temp 97.7 F (36.5 C) (Oral)   Ht 5\' 4"  (1.626 m)   Wt 161 lb 9.6 oz (73.3 kg) Comment: pt had boot on due to a broken foot  LMP  (LMP Unknown)   SpO2 98%   BMI 27.74 kg/m    Subjective:    Patient ID: Carol [name], female    DOB: [birth year], 67 y.o.   MRN: [id number]  HPI: Carol [name] is a 67 y.o. female  Chief Complaint  Patient presents with  . Hypertension    f/up   Hypertension Using medications without difficulty Average home BPs Not checking   No problems or lightheadedness No chest pain with exertion or shortness of breath No Edema  The 10-year ASCVD risk score Denman George DC Jr., et al., 2013) is: 8.8%   Values used to calculate the score:     Age: 89 years     Sex: Female     Is Non-Hispanic African American: Yes     Diabetic: No     Tobacco smoker: No     Systolic Blood Pressure: 123 mmHg     Is BP treated: Yes     HDL Cholesterol: 43 mg/dL     Total Cholesterol: 189 mg/dL   Relevant past medical, surgical, family and social history reviewed and updated as indicated. Interim medical history since our last visit reviewed. Allergies and medications reviewed and updated.  Review of Systems  Per HPI unless specifically indicated above     Objective:    BP 123/78 (BP Location: Left Arm, Cuff Size: Normal)   Pulse 72   Temp 97.7 F (36.5 C) (Oral)   Ht 5\' 4"  (1.626 m)   Wt 161 lb 9.6 oz (73.3 kg) Comment: pt had boot on due to a broken foot  LMP  (LMP Unknown)   SpO2 98%   BMI 27.74 kg/m   Wt Readings from Last 3 Encounters:  09/13/17 161 lb 9.6 oz (73.3 kg)  08/07/17 140 lb (63.5 kg)  07/01/17 155 lb 9.6 oz (70.6 kg)    Physical Exam  Constitutional: She is oriented to person, place, and time. She appears well-developed and well-nourished. No distress.  HENT:  Head: Normocephalic and atraumatic.  Eyes: Conjunctivae and lids are normal. Right eye exhibits no discharge. Left  eye exhibits no discharge. No scleral icterus.  Neck: Normal range of motion. Neck supple. No JVD present. Carotid bruit is not present.  Cardiovascular: Normal rate, regular rhythm and normal heart sounds.  Pulmonary/Chest: Effort normal and breath sounds normal.  Abdominal: Normal appearance. There is no splenomegaly or hepatomegaly.  Musculoskeletal: Normal range of motion.  In a boot for 5th metatarsal fracture  Neurological: She is alert and oriented to person, place, and time.  Skin: Skin is warm, dry and intact. No rash noted. No pallor.  Psychiatric: She has a normal mood and affect. Her behavior is normal. Judgment and thought content normal.     Assessment & Plan:   Problem List Items Addressed This Visit      Unprioritized   Fracture of 5th metatarsal    See ER note.  Refusing medical procedures until this is healed.  Wearing a boot and seeing podiatry      Hyperlipidemia    Discussed ASCVD risk and not wanting to start a statin.  However, when her 5th metatarsal fracture is healed she will consider.  Relevant Medications   amLODipine (NORVASC) 5 MG tablet   Hypertension    Stable, continue present medications.        Relevant Medications   amLODipine (NORVASC) 5 MG tablet   Other Relevant Orders   Comprehensive metabolic panel       Follow up plan: Return in about 3 months (around 12/14/2017) for physical.

## 2017-09-13 NOTE — Assessment & Plan Note (Signed)
Stable, continue present medications.   

## 2017-09-13 NOTE — Assessment & Plan Note (Signed)
See ER note.  Refusing medical procedures until this is healed.  Wearing a boot and seeing podiatry

## 2017-09-14 ENCOUNTER — Encounter: Payer: Self-pay | Admitting: Unknown Physician Specialty

## 2017-09-14 LAB — COMPREHENSIVE METABOLIC PANEL
ALK PHOS: 134 IU/L — AB (ref 39–117)
ALT: 15 IU/L (ref 0–32)
AST: 19 IU/L (ref 0–40)
Albumin/Globulin Ratio: 1.1 — ABNORMAL LOW (ref 1.2–2.2)
Albumin: 4 g/dL (ref 3.6–4.8)
BUN/Creatinine Ratio: 13 (ref 12–28)
BUN: 13 mg/dL (ref 8–27)
Bilirubin Total: 0.3 mg/dL (ref 0.0–1.2)
CALCIUM: 9.7 mg/dL (ref 8.7–10.3)
CO2: 27 mmol/L (ref 20–29)
CREATININE: 0.97 mg/dL (ref 0.57–1.00)
Chloride: 100 mmol/L (ref 96–106)
GFR calc Af Amer: 70 mL/min/{1.73_m2} (ref >59)
GFR, EST NON AFRICAN AMERICAN: 61 mL/min/{1.73_m2} (ref >59)
GLOBULIN, TOTAL: 3.6 g/dL (ref 1.5–4.5)
GLUCOSE: 95 mg/dL (ref 65–99)
Potassium: 4.1 mmol/L (ref 3.5–5.2)
Sodium: 141 mmol/L (ref 134–144)
Total Protein: 7.6 g/dL (ref 6.0–8.5)

## 2017-11-19 ENCOUNTER — Other Ambulatory Visit: Payer: Self-pay | Admitting: Unknown Physician Specialty

## 2017-11-21 NOTE — Telephone Encounter (Signed)
Requested Prescriptions  Pending Prescriptions Disp Refills  . meloxicam (MOBIC) 15 MG tablet [Pharmacy Med Name: MELOXICAM 15 MG TAB] 90 tablet 2    Sig: TAKE 1 TABLET BY MOUTH DAILY     Analgesics:  COX2 Inhibitors Failed - 11/19/2017  8:51 AM      Failed - HGB in normal range and within 360 days    HGB  Date Value Ref Range Status  12/20/2013 11.4 (L) 12.0 - 16.0 g/dL Final         Passed - Cr in normal range and within 360 days    Creatinine  Date Value Ref Range Status  12/20/2013 0.72 0.60 - 1.30 mg/dL Final   Creatinine, Ser  Date Value Ref Range Status  09/13/2017 0.97 0.57 - 1.00 mg/dL Final         Passed - Patient is not pregnant      Passed - Valid encounter within last 12 months    Recent Outpatient Visits          2 months ago Essential hypertension   [facility] Gabriel Cirri, NP   4 months ago Vertigo   [facility] Gabriel Cirri, NP   9 months ago Cough   [facility] Gabriel Cirri, NP   1 year ago Urticaria   [facility] Maser [place], [place]   1 year ago Pharyngitis, unspecified etiology   [facility] [place], [place], [place]      Future Appointments            In 3 weeks Cannady, Dorie Rank, NP [facility]   In 4 months  [facility]

## 2017-12-14 ENCOUNTER — Ambulatory Visit (INDEPENDENT_AMBULATORY_CARE_PROVIDER_SITE_OTHER): Payer: Medicare Other | Admitting: Nurse Practitioner

## 2017-12-14 ENCOUNTER — Other Ambulatory Visit: Payer: Self-pay

## 2017-12-14 ENCOUNTER — Encounter: Payer: Self-pay | Admitting: Nurse Practitioner

## 2017-12-14 VITALS — BP 128/78 | HR 70 | Temp 97.5°F | Ht 62.0 in | Wt 160.3 lb

## 2017-12-14 DIAGNOSIS — I10 Essential (primary) hypertension: Secondary | ICD-10-CM | POA: Diagnosis not present

## 2017-12-14 DIAGNOSIS — E785 Hyperlipidemia, unspecified: Secondary | ICD-10-CM | POA: Diagnosis not present

## 2017-12-14 DIAGNOSIS — B351 Tinea unguium: Secondary | ICD-10-CM

## 2017-12-14 DIAGNOSIS — Z0000 Encounter for general adult medical examination without abnormal findings: Secondary | ICD-10-CM

## 2017-12-14 DIAGNOSIS — M67432 Ganglion, left wrist: Secondary | ICD-10-CM

## 2017-12-14 MED ORDER — TERBINAFINE HCL 1 % EX CREA: 1.0000 "application " | TOPICAL_CREAM | Freq: Two times a day (BID) | CUTANEOUS | 0 refills | Status: DC

## 2017-12-14 MED ORDER — AMLODIPINE BESYLATE 5 MG PO TABS: 5.0000 mg | ORAL_TABLET | Freq: Every day | ORAL | 3 refills | Status: DC

## 2017-12-14 NOTE — Patient Instructions (Signed)
Athlete's Foot Athlete's foot (tinea pedis) is a fungal infection of the skin on the feet. It often occurs on the skin that is between or underneath the toes. It can also occur on the soles of the feet. The infection can spread from person to person (is contagious). Follow these instructions at home:  Apply or take over-the-counter and prescription medicines only as told by your doctor.  Keep all follow-up visits as told by your doctor. This is important.  Do not scratch your feet.  Keep your feet dry: ? Wear cotton or wool socks. Change your socks every day or if they become wet. ? Wear shoes that allow air to move around, such as sandals or canvas tennis shoes.  Wash and dry your feet: ? Every day or as told by your doctor. ? After exercising. ? Including the area between your toes.  Wear sandals in wet areas, such as locker rooms and shared showers.  Do not share any of these items: ? Towels. ? Nail clippers. ? Other personal items that touch your feet.  If you have diabetes, keep your blood sugar under control. Contact a doctor if:  You have a fever.  You have swelling, soreness, warmth, or redness in your foot.  You are not getting better with treatment.  Your symptoms get worse.  You have new symptoms. This information is not intended to replace advice given to you by your health care provider. Make sure you discuss any questions you have with your health care provider. Document Released: 07/21/2007 Document Revised: 07/10/2015 Document Reviewed: 08/05/2014 Elsevier Interactive Patient Education  2018 [employer]. Ganglion Cyst A ganglion cyst is a noncancerous, fluid-filled lump that occurs near joints or tendons. The ganglion cyst grows out of a joint or the lining of a tendon. It most often develops in the hand or wrist, but it can also develop in the shoulder, elbow, hip, knee, ankle, or foot. The round or oval ganglion cyst can be the size of a pea or larger  than a grape. Increased activity may enlarge the size of the cyst because more fluid starts to build up. What are the causes? It is not known what causes a ganglion cyst to grow. However, it may be related to:  Inflammation or irritation around the joint.  An injury.  Repetitive movements or overuse.  Arthritis.  What increases the risk? Risk factors include:  Being a woman.  Being age 77-50.  What are the signs or symptoms? Symptoms may include:  A lump. This most often appears on the hand or wrist, but it can occur in other areas of the body.  Tingling.  Pain.  Numbness.  Muscle weakness.  Weak grip.  Less movement in a joint.  How is this diagnosed? Ganglion cysts are most often diagnosed based on a physical exam. Your health care provider will feel the lump and may shine a light alongside it. If it is a ganglion cyst, a light often shines through it. Your health care provider may order an X-ray, ultrasound, or MRI to rule out other conditions. How is this treated? Ganglion cysts usually go away on their own without treatment. If pain or other symptoms are involved, treatment may be needed. Treatment is also needed if the ganglion cyst limits your movement or if it gets infected. Treatment may include:  Wearing a brace or splint on your wrist or finger.  Taking anti-inflammatory medicine.  Draining fluid from the lump with a needle (aspiration).  Injecting  a steroid into the joint.  Surgery to remove the ganglion cyst.  Follow these instructions at home:  Do not press on the ganglion cyst, poke it with a needle, or hit it.  Take medicines only as directed by your health care provider.  Wear your brace or splint as directed by your health care provider.  Watch your ganglion cyst for any changes.  Keep all follow-up visits as directed by your health care provider. This is important. Contact a health care provider if:  Your ganglion cyst becomes larger  or more painful.  You have increased redness, red streaks, or swelling.  You have pus coming from the lump.  You have weakness or numbness in the affected area.  You have a fever or chills. This information is not intended to replace advice given to you by your health care provider. Make sure you discuss any questions you have with your health care provider. Document Released: 01/30/2000 Document Revised: 07/10/2015 Document Reviewed: 07/17/2013 Elsevier Interactive Patient Education  2018 [employer].

## 2017-12-14 NOTE — Progress Notes (Signed)
BP 128/78   Pulse 70   Temp (!) 97.5 F (36.4 C) (Oral)   Ht 5\' 2"  (1.575 m)   Wt 160 lb 4.8 oz (72.7 kg)   LMP  (LMP Unknown)   SpO2 98%   BMI 29.32 kg/m    Subjective:    Patient ID: Carol [name], female    DOB: 23-Jan-1951, 67 y.o.   MRN: [id number]  HPI: Carol [name] is a 67 y.o. female presents for annual exam  Chief Complaint  Patient presents with  . Annual Exam    pt would like to discuss a bump on left wrist and her left big toe  . Hypertension  . Hyperlipidemia  . Other    pt would like to have a home test cologuard for colon cancer screening   HYPERTENSION / HYPERLIPIDEMIA Reports she takes medications daily with no issues. Satisfied with current treatment? yes Duration of hypertension: chronic BP monitoring frequency: not checking BP range:  BP medication side effects: no Past BP meds: does not recall Duration of hyperlipidemia: chronic, no current medications Cholesterol medication side effects: no Cholesterol supplements: none Past cholesterol medications: none Medication compliance: no current medications Aspirin: yes Recent stressors: no Recurrent headaches: no Visual changes: no Palpitations: no Dyspnea: no Chest pain: no Lower extremity edema: no Dizzy/lightheaded: no   GANGLION CYST INNER LEFT WRIST: New, noticed two weeks ago.  Has not had one before that she recalls.  Is not causing discomfort or pain.  No changes in range of motion.  No neuropathy or difficulty performing tasks.  States ir has not changed in size in past two weeks.  Discussed options and at this time patient wishes to monitor area.  If continues growth or any discomfort will then obtain ultrasound and send patient to ortho for possible drain of area.  ONYCHOMYCOSIS TOENAIL: Chronic, ongoing issue.  Waxes and wanes per her report.  Previously took oral medications, Terbinafine caused her nausea and Sporanox did not.  She would prefer not to take any more oral  medications unless needed for toe.  Ongoing to left great toe.  Discussed fungal infections with patient.  She reports understanding that they will often reoccur.  She reports she does go outside frequently with shoes on in the garden.  Educated on proper foot care.  She agrees to trying cream to area.   Depression screen [facility] 2/9 12/14/2017 01/31/2017 01/30/2016 01/27/2015  Decreased Interest 1 0 0 0  Down, Depressed, Hopeless 0 0 0 0  PHQ - 2 Score 1 0 0 0  Altered sleeping 0 - 0 -  Tired, decreased energy 0 - 0 -  Change in appetite 0 - 0 -  Feeling bad or failure about yourself  0 - 0 -  Trouble concentrating 0 - 0 -  Moving slowly or fidgety/restless 0 - 0 -  Suicidal thoughts 0 - 0 -  PHQ-9 Score 1 - 0 -   GAD 7 : Generalized Anxiety Score 12/14/2017  Nervous, Anxious, on Edge 0  Control/stop worrying 0  Worry too much - different things 0  Trouble relaxing 0  Restless 0  Easily annoyed or irritable 0  Afraid - awful might happen 0  Total GAD 7 Score 0   Functional Status Survey:  No functional issues. Is the patient deaf or have difficulty hearing?: No Does the patient have difficulty seeing, even when wearing glasses/contacts?: No Does the patient have difficulty concentrating, remembering, or making decisions?: No Does the  patient have difficulty walking or climbing stairs?: No Does the patient have difficulty dressing or bathing?: No Does the patient have difficulty doing errands alone such as visiting a doctor's office or shopping?: No  Relevant past medical, surgical, family and social history reviewed and updated as indicated. Interim medical history since our last visit reviewed. Allergies and medications reviewed and updated.  Review of Systems  Constitutional: Negative for activity change, appetite change, diaphoresis, fatigue and fever.  HENT: Negative for congestion, dental problem, ear pain, hearing loss, rhinorrhea, sinus pressure, sinus pain, trouble  swallowing and voice change.   Eyes: Negative for pain, discharge and visual disturbance.  Respiratory: Negative for cough, chest tightness, shortness of breath and wheezing.   Cardiovascular: Negative for chest pain, palpitations and leg swelling.  Gastrointestinal: Negative for abdominal distention, abdominal pain, constipation, diarrhea, nausea and vomiting.  Endocrine: Negative for cold intolerance, heat intolerance, polydipsia, polyphagia and polyuria.  Genitourinary: Negative.   Musculoskeletal: Negative.   Skin: Negative.   Neurological: Negative for dizziness, tremors, speech difficulty, weakness, light-headedness, numbness and headaches.  Psychiatric/Behavioral: Negative for behavioral problems, sleep disturbance and suicidal ideas. The patient is not nervous/anxious.     Per HPI unless specifically indicated above     Objective:    BP 128/78   Pulse 70   Temp (!) 97.5 F (36.4 C) (Oral)   Ht 5\' 2"  (1.575 m)   Wt 160 lb 4.8 oz (72.7 kg)   LMP  (LMP Unknown)   SpO2 98%   BMI 29.32 kg/m   Wt Readings from Last 3 Encounters:  12/14/17 160 lb 4.8 oz (72.7 kg)  09/13/17 161 lb 9.6 oz (73.3 kg)  08/07/17 140 lb (63.5 kg)    Physical Exam  Constitutional: She is oriented to person, place, and time. She appears well-developed and well-nourished.  HENT:  Head: Normocephalic and atraumatic.  Right Ear: External ear normal.  Left Ear: External ear normal.  Nose: Nose normal.  Mouth/Throat: Oropharynx is clear and moist.  Eyes: Pupils are equal, round, and reactive to light. Conjunctivae and EOM are normal. Right eye exhibits no discharge. Left eye exhibits no discharge.  Neck: Normal range of motion. Neck supple. No JVD present. Carotid bruit is not present. No thyromegaly present.  Cardiovascular: Normal rate, regular rhythm, S1 normal, S2 normal, normal heart sounds and intact distal pulses.  Pulses:      Dorsalis pedis pulses are 2+ on the right side, and 2+ on the left  side.       Posterior tibial pulses are 2+ on the right side, and 2+ on the left side.  Pulmonary/Chest: Effort normal and breath sounds normal. Right breast exhibits no inverted nipple, no mass, no nipple discharge, no skin change and no tenderness. Left breast exhibits no inverted nipple, no mass, no nipple discharge, no skin change and no tenderness.  Abdominal: Soft. Bowel sounds are normal. There is no splenomegaly or hepatomegaly.  Musculoskeletal: Normal range of motion. She exhibits no edema.  Lymphadenopathy:    She has no cervical adenopathy.  Neurological: She is alert and oriented to person, place, and time. She has normal strength and normal reflexes. No cranial nerve deficit. She displays a negative Romberg sign.  Reflex Scores:      Brachioradialis reflexes are 2+ on the right side and 2+ on the left side.      Patellar reflexes are 2+ on the right side and 2+ on the left side. Skin: Skin is warm and dry.  Psychiatric: She has a normal mood and affect. Her speech is normal and behavior is normal. Judgment and thought content normal.    Results for orders placed or performed in visit on 09/13/17  Comprehensive metabolic panel  Result Value Ref Range   Glucose 95 65 - 99 mg/dL   BUN 13 8 - 27 mg/dL   Creatinine, Ser 1.61 0.57 - 1.00 mg/dL   GFR calc non Af Amer 61 >59 mL/min/1.73   GFR calc Af Amer 70 >59 mL/min/1.73   BUN/Creatinine Ratio 13 12 - 28   Sodium 141 134 - 144 mmol/L   Potassium 4.1 3.5 - 5.2 mmol/L   Chloride 100 96 - 106 mmol/L   CO2 27 20 - 29 mmol/L   Calcium 9.7 8.7 - 10.3 mg/dL   Total Protein 7.6 6.0 - 8.5 g/dL   Albumin 4.0 3.6 - 4.8 g/dL   Globulin, Total 3.6 1.5 - 4.5 g/dL   Albumin/Globulin Ratio 1.1 (L) 1.2 - 2.2   Bilirubin Total 0.3 0.0 - 1.2 mg/dL   Alkaline Phosphatase 134 (H) 39 - 117 IU/L   AST 19 0 - 40 IU/L   ALT 15 0 - 32 IU/L      Assessment & Plan:   Problem List Items Addressed This Visit      Cardiovascular and  Mediastinum   Hypertension    Chronic, stable.  Continue current med regimen.      Relevant Medications   amLODipine (NORVASC) 5 MG tablet     Musculoskeletal and Integument   Onychomycosis of toenail    Chronic, wax/wane.  Does not wish oral medications.  Lamisil cream ordered, patient educated on medication.      Relevant Medications   terbinafine (LAMISIL AT) 1 % cream     Other   Hyperlipidemia    Chronic, ongoing.  Does not wish to start statin.  Lipid panel today and further discuss at future visit.      Relevant Medications   amLODipine (NORVASC) 5 MG tablet   Ganglion of left wrist    Present to palmar aspect left wrist.  Grape size, no tenderness.  Continue to monitor.  If increased size obtain ultrasound and send to ortho for possible I&D.       Other Visit Diagnoses    Annual physical exam    -  Primary   Relevant Orders   Cologuard   CBC with Differential/Platelet   Comprehensive metabolic panel   TSH   Lipid Panel w/o Chol/HDL Ratio   HgB A1c       Follow up plan: Return in about 3 months (around 03/16/2018) for HTN and ganglion cyst.

## 2017-12-14 NOTE — Assessment & Plan Note (Addendum)
Present to palmar aspect left wrist.  Grape size, no tenderness.  Continue to monitor.  If increased size obtain ultrasound and send to ortho for possible I&D.

## 2017-12-14 NOTE — Assessment & Plan Note (Signed)
Chronic, ongoing.  Does not wish to start statin.  Lipid panel today and further discuss at future visit.

## 2017-12-14 NOTE — Assessment & Plan Note (Signed)
Chronic, wax/wane.  Does not wish oral medications.  Lamisil cream ordered, patient educated on medication.

## 2017-12-14 NOTE — Assessment & Plan Note (Signed)
Chronic, stable.  Continue current med regimen. 

## 2017-12-15 ENCOUNTER — Telehealth: Payer: Self-pay | Admitting: Nurse Practitioner

## 2017-12-15 LAB — LIPID PANEL W/O CHOL/HDL RATIO
Cholesterol, Total: 221 mg/dL — ABNORMAL HIGH (ref 100–199)
HDL: 40 mg/dL (ref >39)
LDL Calculated: 156 mg/dL — ABNORMAL HIGH (ref 0–99)
TRIGLYCERIDES: 125 mg/dL (ref 0–149)
VLDL Cholesterol Cal: 25 mg/dL (ref 5–40)

## 2017-12-15 LAB — COMPREHENSIVE METABOLIC PANEL
A/G RATIO: 1 — AB (ref 1.2–2.2)
ALK PHOS: 118 IU/L — AB (ref 39–117)
ALT: 8 IU/L (ref 0–32)
AST: 18 IU/L (ref 0–40)
Albumin: 4 g/dL (ref 3.6–4.8)
BUN/Creatinine Ratio: 16 (ref 12–28)
BUN: 12 mg/dL (ref 8–27)
Bilirubin Total: 0.3 mg/dL (ref 0.0–1.2)
CO2: 27 mmol/L (ref 20–29)
CREATININE: 0.77 mg/dL (ref 0.57–1.00)
Calcium: 9.7 mg/dL (ref 8.7–10.3)
Chloride: 101 mmol/L (ref 96–106)
GFR calc Af Amer: 93 mL/min/{1.73_m2} (ref >59)
GFR calc non Af Amer: 81 mL/min/{1.73_m2} (ref >59)
GLOBULIN, TOTAL: 3.9 g/dL (ref 1.5–4.5)
Glucose: 93 mg/dL (ref 65–99)
POTASSIUM: 4.2 mmol/L (ref 3.5–5.2)
SODIUM: 140 mmol/L (ref 134–144)
Total Protein: 7.9 g/dL (ref 6.0–8.5)

## 2017-12-15 LAB — CBC WITH DIFFERENTIAL/PLATELET
Basophils Absolute: 0 10*3/uL (ref 0.0–0.2)
Basos: 1 %
EOS (ABSOLUTE): 0.1 10*3/uL (ref 0.0–0.4)
EOS: 3 %
Hematocrit: 38.1 % (ref 34.0–46.6)
Hemoglobin: 12.6 g/dL (ref 11.1–15.9)
IMMATURE GRANULOCYTES: 0 %
Immature Grans (Abs): 0 10*3/uL (ref 0.0–0.1)
LYMPHS ABS: 1.8 10*3/uL (ref 0.7–3.1)
Lymphs: 43 %
MCH: 28.6 pg (ref 26.6–33.0)
MCHC: 33.1 g/dL (ref 31.5–35.7)
MCV: 86 fL (ref 79–97)
MONOS ABS: 0.5 10*3/uL (ref 0.1–0.9)
Monocytes: 12 %
NEUTROS PCT: 41 %
Neutrophils Absolute: 1.7 10*3/uL (ref 1.4–7.0)
PLATELETS: 537 10*3/uL — AB (ref 150–450)
RBC: 4.41 x10E6/uL (ref 3.77–5.28)
RDW: 12.7 % (ref 12.3–15.4)
WBC: 4.1 10*3/uL (ref 3.4–10.8)

## 2017-12-15 LAB — HEMOGLOBIN A1C
Est. average glucose Bld gHb Est-mCnc: 120 mg/dL
Hgb A1c MFr Bld: 5.8 % — ABNORMAL HIGH (ref 4.8–5.6)

## 2017-12-15 LAB — TSH: TSH: 6.6 u[IU]/mL — AB (ref 0.450–4.500)

## 2017-12-15 MED ORDER — LEVOTHYROXINE SODIUM 25 MCG PO TABS: 25.0000 ug | ORAL_TABLET | Freq: Every day | ORAL | 5 refills | Status: DC

## 2017-12-15 NOTE — Telephone Encounter (Signed)
Spoke to Ms. Verge via telephone to review recent labs. TSH 6.660, patient currently not on thyroid medication. ATA guidelines recommend goal for age group <6.  Discussed via telephone initiating small dose of Levothyroxine and provided education on medication and hypothyroidism.  She agrees with this plan of care.  Lipid panel showed elevation in TCHOL and LDL, she reports she did eat a little before labs.  Does "not like taking big pills".  Would like to further discuss cholesterol level at 6 week follow-up for thyroid, will attempt to obtain fasting lipid panel at that time if possible.  Discussed current risk score with her.  She is to follow-up with provider in 6 weeks for TSH and discussion on cholesterol and statin.  Also informed patient that A1C shows slight elevation, 5.8%, prediabetic.  Discussed eating healthy diabetic diet.  The 10-year ASCVD risk score Denman George DC Montez Hageman., et al., 2013) is: 11.1%   Values used to calculate the score:     Age: 67 years     Sex: Female     Is Non-Hispanic African American: Yes     Diabetic: No     Tobacco smoker: No     Systolic Blood Pressure: 128 mmHg     Is BP treated: Yes     HDL Cholesterol: 40 mg/dL     Total Cholesterol: 221 mg/dL

## 2017-12-21 ENCOUNTER — Ambulatory Visit (INDEPENDENT_AMBULATORY_CARE_PROVIDER_SITE_OTHER): Payer: Medicare Other

## 2017-12-21 DIAGNOSIS — Z23 Encounter for immunization: Secondary | ICD-10-CM | POA: Diagnosis not present

## 2017-12-21 NOTE — Patient Instructions (Signed)

## 2017-12-26 ENCOUNTER — Telehealth: Payer: Self-pay | Admitting: Nurse Practitioner

## 2017-12-26 ENCOUNTER — Ambulatory Visit: Payer: Self-pay | Admitting: *Deleted

## 2017-12-26 ENCOUNTER — Encounter: Payer: Self-pay | Admitting: Nurse Practitioner

## 2017-12-26 ENCOUNTER — Other Ambulatory Visit: Payer: Self-pay | Admitting: Nurse Practitioner

## 2017-12-26 DIAGNOSIS — R7989 Other specified abnormal findings of blood chemistry: Secondary | ICD-10-CM | POA: Insufficient documentation

## 2017-12-26 DIAGNOSIS — E063 Autoimmune thyroiditis: Secondary | ICD-10-CM | POA: Insufficient documentation

## 2017-12-26 DIAGNOSIS — Z1231 Encounter for screening mammogram for malignant neoplasm of breast: Secondary | ICD-10-CM

## 2017-12-26 NOTE — Telephone Encounter (Addendum)
Patient is calling to report she is having side effects with the new thyroid medication- she wants to discuss stopping it - or changing it. Please call her and let her know what to do.  Reason for Disposition . Caller has NON-URGENT medication question about med that PCP prescribed and triager unable to answer question  Answer Assessment - Initial Assessment Questions 1. SYMPTOMS: "Do you have any symptoms?"     After eating patient states she has nausea, diarrhea every day, more headaches- patient states it makes her feel funny- she feels drained and weak. 2. SEVERITY: If symptoms are present, ask "Are they mild, moderate or severe?"     moderate  Protocols used: MEDICATION QUESTION CALL-A-AH

## 2017-12-26 NOTE — Telephone Encounter (Signed)
Attempted to contact pt; left message for pt to call office with her husband, Graciella Belton [phone]; unable to nurse triage at this time.

## 2017-12-26 NOTE — Telephone Encounter (Signed)
I will attempt calling her in morning to discuss further.

## 2017-12-26 NOTE — Telephone Encounter (Signed)
Pt would like to have provider call her in regards to thyroid medicine. States it is making her feel different. Pt is unable to be reached between between 9:30Am and 12:30 Pm.

## 2017-12-26 NOTE — Telephone Encounter (Signed)
3 attempts to return pt's call ; spoke with both the husband, and the granddaughter.  Left message for pt. To return call to discuss her symptoms.    Message from Windy Kalata, NT sent at 12/26/2017 10:28 AM EST   Patient is calling and states she was put on levothyroxine (SYNTHROID, LEVOTHROID) 25 MCG tablet and states she does not think it is agreeing with her stomach. She states it is making her stomach hurt and head hurt. She would like to speak with a nurse. She states anytime after 12pm.

## 2017-12-27 ENCOUNTER — Telehealth: Payer: Self-pay | Admitting: Nurse Practitioner

## 2017-12-27 NOTE — Telephone Encounter (Signed)
Spoke to [name] via telephone, she recently started Levothyroxine as TSH noted 6.600.  Is having side effects with Levothyroxine, including diarrhea and overall not feeling well.  Discussed at length with her.  Will hold medication at this time and reassess her TSH + T3 and T4 at her visit 03/16/2018.  If levels continue to elevate will readdress and discussed starting at 1/2 dose (currently taking 25MG  QAM and would start at 12.5MG ) to decrease side effects.  She agrees with this plan of care and was able to verbalize it back to provider.

## 2018-01-30 ENCOUNTER — Other Ambulatory Visit: Payer: Self-pay | Admitting: Nurse Practitioner

## 2018-01-30 NOTE — Telephone Encounter (Signed)
Copied from CRM [id number]. Topic: Quick Communication - Rx Refill/Question >> Jan 30, 2018  2:41 PM Raoul PitchWilliams-Neal, Sade R wrote: Medication: terbinafine (LAMISIL AT) 1 % cream  Has the patient contacted their pharmacy? Yes  Preferred Pharmacy (with phone number or street name): [facility] - [place], [place] - [address] [phone] (Phone) [phone] (Fax)    Agent: Please be advised that RX refills may take up to 3 business days. We ask that you follow-up with your pharmacy.

## 2018-01-31 MED ORDER — TERBINAFINE HCL 1 % EX CREA: 1.0000 "application " | TOPICAL_CREAM | Freq: Two times a day (BID) | CUTANEOUS | 0 refills | Status: DC

## 2018-01-31 NOTE — Telephone Encounter (Signed)
Requested medication (s) are due for refill today: yes  Requested medication (s) are on the active medication list: yes  Last refill:  01/14/18  Future visit scheduled: yes  Notes to clinic:  No assigned protocol    Requested Prescriptions  Pending Prescriptions Disp Refills   terbinafine (LAMISIL AT) 1 % cream 30 g 0    Sig: Apply 1 application topically 2 (two) times daily.     Off-Protocol Failed - 01/30/2018  3:08 PM      Failed - Medication not assigned to a protocol, review manually.      Passed - Valid encounter within last 12 months    Recent Outpatient Visits          1 month ago Annual physical exam   [facility] [place], Dorie RankJolene T, NP   4 months ago Essential hypertension   [facility] Gabriel CirriWicker, Cheryl, NP   7 months ago Vertigo   [facility] Cirri[facility] Cher[facility], [facility]   12 months ago Cough   [facility] Cirri[facility]icker[facility] Cheryl, [facility]   1 year ago Urticaria   [facility]icia Nearing[facility]ane, Rachel [place], [place]      Future Appointments            In 1 month Cannady, Dorie RankJolene T, NP [facility]   In 1 month  [facility]

## 2018-02-03 ENCOUNTER — Ambulatory Visit
Admission: RE | Admit: 2018-02-03 | Discharge: 2018-02-03 | Disposition: A | Discharge status: Home / Self Care | Payer: Medicare Other | Source: Ambulatory Visit | Attending: Nurse Practitioner | Admitting: Nurse Practitioner

## 2018-02-03 DIAGNOSIS — Z1231 Encounter for screening mammogram for malignant neoplasm of breast: Secondary | ICD-10-CM

## 2018-02-03 NOTE — Progress Notes (Signed)
Normal test results noted.  Please call patient and make them aware of normal results and will continue to monitor at regular visits.  Have a great day

## 2018-02-09 ENCOUNTER — Ambulatory Visit (INDEPENDENT_AMBULATORY_CARE_PROVIDER_SITE_OTHER): Payer: Medicare Other | Admitting: Nurse Practitioner

## 2018-02-09 ENCOUNTER — Encounter: Payer: Self-pay | Admitting: Nurse Practitioner

## 2018-02-09 DIAGNOSIS — J069 Acute upper respiratory infection, unspecified: Secondary | ICD-10-CM | POA: Diagnosis not present

## 2018-02-09 DIAGNOSIS — J0110 Acute frontal sinusitis, unspecified: Secondary | ICD-10-CM | POA: Insufficient documentation

## 2018-02-09 MED ORDER — TERBINAFINE HCL 1 % EX CREA: 1.0000 "application " | TOPICAL_CREAM | Freq: Two times a day (BID) | CUTANEOUS | 0 refills | Status: DC

## 2018-02-09 MED ORDER — BENZONATATE 200 MG PO CAPS: 200.0000 mg | ORAL_CAPSULE | Freq: Two times a day (BID) | ORAL | 0 refills | Status: DC | PRN

## 2018-02-09 NOTE — Assessment & Plan Note (Signed)
Viral in appearance.  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  Discussed virus treatment with patient.  Continue at home Claritin and Flonase regimen.  Tessalon script sent.  Humidifier at home.  Return for worsening or continued symptoms if still present in 5-7 days.

## 2018-02-09 NOTE — Patient Instructions (Signed)

## 2018-02-09 NOTE — Progress Notes (Signed)
BP 122/76   Pulse 90   Temp 98.2 F (36.8 C) (Oral)   Ht 5\' 2"  (1.575 m)   Wt 159 lb (72.1 kg)   LMP  (LMP Unknown)   SpO2 97%   BMI 29.08 kg/m    Subjective:    Patient ID: Carol FitchLaverne B Drudge, female    DOB: 03/15/1950, 67 y.o.   MRN: [id number]  HPI: Carol FitchLaverne B Willy is a 67 y.o. female presents for acute visit  Chief Complaint  Patient presents with  . Cough    Productivw  . Nasal Congestion    x couple days.    UPPER RESPIRATORY TRACT INFECTION Has been present for a couple days.  Started out with nasal drainage and cough.  Reports cough medicine is helping. Worst symptom: cough Fever: no Cough: yes Shortness of breath: no Wheezing: no Chest pain: no Chest tightness: yes Chest congestion: yes Nasal congestion: yes Runny nose: yes Post nasal drip: yes Sneezing: no Sore throat: no Swollen glands: no Sinus pressure: no Headache: no Face pain: no Toothache: no Ear pain: none Ear pressure: none Eyes red/itching:no Eye drainage/crusting: no  Vomiting: no Rash: no Fatigue: yes Sick contacts: yes Strep contacts: no  Context: fluctuating Recurrent sinusitis: no Relief with OTC cold/cough medications: yes  Treatments attempted: cough syrup   Relevant past medical, surgical, family and social history reviewed and updated as indicated. Interim medical history since our last visit reviewed. Allergies and medications reviewed and updated.  Review of Systems  Constitutional: Positive for fatigue. Negative for activity change, appetite change, diaphoresis and fever.  HENT: Positive for congestion, postnasal drip and rhinorrhea. Negative for ear discharge, ear pain, sinus pressure, sinus pain, sneezing, sore throat and voice change.   Eyes: Negative for pain and visual disturbance.  Respiratory: Positive for cough. Negative for chest tightness, shortness of breath and wheezing.   Cardiovascular: Negative for chest pain, palpitations and leg swelling.    Gastrointestinal: Negative for abdominal distention, abdominal pain, constipation, diarrhea, nausea and vomiting.  Neurological: Negative for dizziness, syncope, weakness, light-headedness, numbness and headaches.  Psychiatric/Behavioral: Negative.     Per HPI unless specifically indicated above     Objective:    BP 122/76   Pulse 90   Temp 98.2 F (36.8 C) (Oral)   Ht 5\' 2"  (1.575 m)   Wt 159 lb (72.1 kg)   LMP  (LMP Unknown)   SpO2 97%   BMI 29.08 kg/m   Wt Readings from Last 3 Encounters:  02/09/18 159 lb (72.1 kg)  12/14/17 160 lb 4.8 oz (72.7 kg)  09/13/17 161 lb 9.6 oz (73.3 kg)    Physical Exam Vitals signs and nursing note reviewed.  Constitutional:      General: She is awake.     Appearance: She is well-developed. She is not ill-appearing.  HENT:     Head: Normocephalic.     Right Ear: Hearing, tympanic membrane, ear canal and external ear normal.     Left Ear: Hearing, tympanic membrane, ear canal and external ear normal.     Nose: Mucosal edema and rhinorrhea present. Rhinorrhea is clear.     Right Sinus: No maxillary sinus tenderness or frontal sinus tenderness.     Left Sinus: No maxillary sinus tenderness or frontal sinus tenderness.     Mouth/Throat:     Lips: Pink.     Mouth: Mucous membranes are moist.     Pharynx: No pharyngeal swelling, oropharyngeal exudate or posterior oropharyngeal erythema.  Eyes:  General:        Right eye: No discharge.        Left eye: No discharge.     Conjunctiva/sclera: Conjunctivae normal.     Pupils: Pupils are equal, round, and reactive to light.  Neck:     Musculoskeletal: Normal range of motion and neck supple.     Thyroid: No thyromegaly.     Vascular: No carotid bruit or JVD.  Cardiovascular:     Rate and Rhythm: Normal rate and regular rhythm.     Heart sounds: Normal heart sounds.  Pulmonary:     Effort: Pulmonary effort is normal.     Breath sounds: Normal breath sounds.     Comments: Clear  throughout.  No cough present during exam. Abdominal:     General: Bowel sounds are normal.     Palpations: Abdomen is soft.  Lymphadenopathy:     Cervical: No cervical adenopathy.  Skin:    General: Skin is warm and dry.  Neurological:     Mental Status: She is alert and oriented to person, place, and time.  Psychiatric:        Mood and Affect: Mood normal.        Behavior: Behavior normal. Behavior is cooperative.        Thought Content: Thought content normal.        Judgment: Judgment normal.     Results for orders placed or performed in visit on 12/14/17  CBC with Differential/Platelet  Result Value Ref Range   WBC 4.1 3.4 - 10.8 x10E3/uL   RBC 4.41 3.77 - 5.28 x10E6/uL   Hemoglobin 12.6 11.1 - 15.9 g/dL   Hematocrit 40.938.1 81.134.0 - 46.6 %   MCV 86 79 - 97 fL   MCH 28.6 26.6 - 33.0 pg   MCHC 33.1 31.5 - 35.7 g/dL   RDW 91.412.7 78.212.3 - 95.615.4 %   Platelets 537 (H) 150 - 450 x10E3/uL   Neutrophils 41 Not Estab. %   Lymphs 43 Not Estab. %   Monocytes 12 Not Estab. %   Eos 3 Not Estab. %   Basos 1 Not Estab. %   Neutrophils Absolute 1.7 1.4 - 7.0 x10E3/uL   Lymphocytes Absolute 1.8 0.7 - 3.1 x10E3/uL   Monocytes Absolute 0.5 0.1 - 0.9 x10E3/uL   EOS (ABSOLUTE) 0.1 0.0 - 0.4 x10E3/uL   Basophils Absolute 0.0 0.0 - 0.2 x10E3/uL   Immature Granulocytes 0 Not Estab. %   Immature Grans (Abs) 0.0 0.0 - 0.1 x10E3/uL  Comprehensive metabolic panel  Result Value Ref Range   Glucose 93 65 - 99 mg/dL   BUN 12 8 - 27 mg/dL   Creatinine, Ser 2.130.77 0.57 - 1.00 mg/dL   GFR calc non Af Amer 81 >59 mL/min/1.73   GFR calc Af Amer 93 >59 mL/min/1.73   BUN/Creatinine Ratio 16 12 - 28   Sodium 140 134 - 144 mmol/L   Potassium 4.2 3.5 - 5.2 mmol/L   Chloride 101 96 - 106 mmol/L   CO2 27 20 - 29 mmol/L   Calcium 9.7 8.7 - 10.3 mg/dL   Total Protein 7.9 6.0 - 8.5 g/dL   Albumin 4.0 3.6 - 4.8 g/dL   Globulin, Total 3.9 1.5 - 4.5 g/dL   Albumin/Globulin Ratio 1.0 (L) 1.2 - 2.2   Bilirubin  Total 0.3 0.0 - 1.2 mg/dL   Alkaline Phosphatase 118 (H) 39 - 117 IU/L   AST 18 0 - 40 IU/L   ALT 8 0 -  32 IU/L  TSH  Result Value Ref Range   TSH 6.600 (H) 0.450 - 4.500 uIU/mL  Lipid Panel w/o Chol/HDL Ratio  Result Value Ref Range   Cholesterol, Total 221 (H) 100 - 199 mg/dL   Triglycerides 161 0 - 149 mg/dL   HDL 40 >09 mg/dL   VLDL Cholesterol Cal 25 5 - 40 mg/dL   LDL Calculated 604 (H) 0 - 99 mg/dL  HgB V4U  Result Value Ref Range   Hgb A1c MFr Bld 5.8 (H) 4.8 - 5.6 %   Est. average glucose Bld gHb Est-mCnc 120 mg/dL      Assessment & Plan:   Problem List Items Addressed This Visit      Respiratory   Upper respiratory infection    Viral in appearance.  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  Discussed virus treatment with patient.  Continue at home Claritin and Flonase regimen.  Tessalon script sent.  Humidifier at home.  Return for worsening or continued symptoms if still present in 5-7 days.      Relevant Medications   terbinafine (LAMISIL AT) 1 % cream       Follow up plan: Return if symptoms worsen or fail to improve.

## 2018-02-13 ENCOUNTER — Ambulatory Visit: Payer: Self-pay

## 2018-02-13 ENCOUNTER — Encounter: Payer: Self-pay | Admitting: Nurse Practitioner

## 2018-02-13 ENCOUNTER — Ambulatory Visit (INDEPENDENT_AMBULATORY_CARE_PROVIDER_SITE_OTHER): Payer: Medicare Other | Admitting: Nurse Practitioner

## 2018-02-13 DIAGNOSIS — J0110 Acute frontal sinusitis, unspecified: Secondary | ICD-10-CM

## 2018-02-13 MED ORDER — DOXYCYCLINE HYCLATE 100 MG PO TABS: 100.0000 mg | ORAL_TABLET | Freq: Two times a day (BID) | ORAL | 0 refills | Status: DC

## 2018-02-13 MED ORDER — AMOXICILLIN-POT CLAVULANATE 250-62.5 MG/5ML PO SUSR: 500.0000 mg | Freq: Two times a day (BID) | ORAL | 0 refills | Status: DC

## 2018-02-13 MED ORDER — HYDROCOD POLST-CPM POLST ER 10-8 MG/5ML PO SUER: 5.0000 mL | Freq: Two times a day (BID) | ORAL | 0 refills | Status: DC | PRN

## 2018-02-13 NOTE — Patient Instructions (Signed)

## 2018-02-13 NOTE — Addendum Note (Signed)
Addended by: Aura DialsANNADY, Justyne Roell T on: 02/13/2018 04:25 PM   Modules accepted: Orders

## 2018-02-13 NOTE — Progress Notes (Signed)
Switched to Doxycycline for cost, patient will have to take pill form to decrease cost.  Liquid forms are higher cost.  Plain Guaifenesin for cough at home, as script cough medication higher cost.

## 2018-02-13 NOTE — Progress Notes (Signed)
BP 116/73   Pulse 74   Temp 98.1 F (36.7 C) (Oral)   Ht 5\' 2"  (1.575 m)   Wt 159 lb (72.1 kg)   LMP  (LMP Unknown)   SpO2 94%   BMI 29.08 kg/m    Subjective:    Patient ID: Carol FitchLaverne B [name], female    DOB: 12/16/1950, 67 y.o.   MRN: [id number]  HPI: Carol [name] is a 67 y.o. female presents for worsening cold symptoms  Chief Complaint  Patient presents with  . Sinus Problem  . Cough   UPPER RESPIRATORY TRACT INFECTION Initial symptoms started on 02/09/18.  Seen at that time and no abx provided due to early symptom presentation.  Symptoms are currently getting worse, sinus symptoms.   Worst symptom: sinus pressure and pain Fever: no Cough: present, but improving Shortness of breath: no Wheezing: no Chest pain: yes, with cough Chest tightness: no Chest congestion: no Nasal congestion: yes Runny nose: yes Post nasal drip: yes Sneezing: yes Sore throat: no Swollen glands: no Sinus pressure: yes Headache: yes Face pain: yes Toothache: no Ear pain: none Ear pressure: none Eyes red/itching:no Eye drainage/crusting: no  Vomiting: no Rash: no Fatigue: yes Sick contacts: yes Strep contacts: no  Context: fluctuating Recurrent sinusitis: no Relief with OTC cold/cough medications: no  Treatments attempted: cold/sinus and mucinex, Tesslon helping with cough   Relevant past medical, surgical, family and social history reviewed and updated as indicated. Interim medical history since our last visit reviewed. Allergies and medications reviewed and updated.  Review of Systems  Constitutional: Positive for fatigue. Negative for activity change, appetite change, diaphoresis and fever.  HENT: Positive for congestion, postnasal drip, rhinorrhea, sinus pressure, sinus pain and sneezing. Negative for ear discharge, ear pain, sore throat, tinnitus and voice change.   Eyes: Negative.   Respiratory: Negative for cough, chest tightness, shortness of breath and wheezing.     Cardiovascular: Negative for chest pain, palpitations and leg swelling.  Gastrointestinal: Negative for abdominal distention, abdominal pain, constipation, diarrhea, nausea and vomiting.  Skin: Negative.   Neurological: Negative for dizziness, syncope, weakness, light-headedness, numbness and headaches.  Psychiatric/Behavioral: Negative.     Per HPI unless specifically indicated above     Objective:    BP 116/73   Pulse 74   Temp 98.1 F (36.7 C) (Oral)   Ht 5\' 2"  (1.575 m)   Wt 159 lb (72.1 kg)   LMP  (LMP Unknown)   SpO2 94%   BMI 29.08 kg/m   Wt Readings from Last 3 Encounters:  02/13/18 159 lb (72.1 kg)  02/09/18 159 lb (72.1 kg)  12/14/17 160 lb 4.8 oz (72.7 kg)    Physical Exam Vitals signs and nursing note reviewed.  Constitutional:      General: She is awake.     Appearance: She is well-developed. She is ill-appearing.  HENT:     Head: Normocephalic. No raccoon eyes.     Right Ear: Hearing, tympanic membrane, ear canal and external ear normal.     Left Ear: Hearing, tympanic membrane, ear canal and external ear normal.     Nose: Mucosal edema and rhinorrhea present. Rhinorrhea is purulent.     Right Sinus: Frontal sinus tenderness present. No maxillary sinus tenderness.     Left Sinus: Frontal sinus tenderness present. No maxillary sinus tenderness.     Mouth/Throat:     Mouth: Mucous membranes are moist.     Pharynx: Posterior oropharyngeal erythema (with cobblestoning) present. No pharyngeal  swelling or oropharyngeal exudate.     Tonsils: No tonsillar exudate. Swelling: 0 on the right. 0 on the left.  Eyes:     General:        Right eye: No discharge.        Left eye: No discharge.     Conjunctiva/sclera: Conjunctivae normal.     Pupils: Pupils are equal, round, and reactive to light.  Neck:     Musculoskeletal: Normal range of motion and neck supple.     Thyroid: No thyromegaly.     Vascular: No carotid bruit or JVD.  Cardiovascular:     Rate and  Rhythm: Normal rate and regular rhythm.     Heart sounds: Normal heart sounds.  Pulmonary:     Effort: Pulmonary effort is normal.     Breath sounds: Normal breath sounds.  Abdominal:     General: Bowel sounds are normal.     Palpations: Abdomen is soft.  Lymphadenopathy:     Head:     Right side of head: No submental, submandibular or tonsillar adenopathy.     Left side of head: No submental, submandibular or tonsillar adenopathy.     Cervical: No cervical adenopathy.  Skin:    General: Skin is warm and dry.  Neurological:     Mental Status: She is alert and oriented to person, place, and time.  Psychiatric:        Mood and Affect: Mood normal.        Behavior: Behavior normal. Behavior is cooperative.        Thought Content: Thought content normal.        Judgment: Judgment normal.     Results for orders placed or performed in visit on 12/14/17  CBC with Differential/Platelet  Result Value Ref Range   WBC 4.1 3.4 - 10.8 x10E3/uL   RBC 4.41 3.77 - 5.28 x10E6/uL   Hemoglobin 12.6 11.1 - 15.9 g/dL   Hematocrit 16.1 09.6 - 46.6 %   MCV 86 79 - 97 fL   MCH 28.6 26.6 - 33.0 pg   MCHC 33.1 31.5 - 35.7 g/dL   RDW 04.5 40.9 - 81.1 %   Platelets 537 (H) 150 - 450 x10E3/uL   Neutrophils 41 Not Estab. %   Lymphs 43 Not Estab. %   Monocytes 12 Not Estab. %   Eos 3 Not Estab. %   Basos 1 Not Estab. %   Neutrophils Absolute 1.7 1.4 - 7.0 x10E3/uL   Lymphocytes Absolute 1.8 0.7 - 3.1 x10E3/uL   Monocytes Absolute 0.5 0.1 - 0.9 x10E3/uL   EOS (ABSOLUTE) 0.1 0.0 - 0.4 x10E3/uL   Basophils Absolute 0.0 0.0 - 0.2 x10E3/uL   Immature Granulocytes 0 Not Estab. %   Immature Grans (Abs) 0.0 0.0 - 0.1 x10E3/uL  Comprehensive metabolic panel  Result Value Ref Range   Glucose 93 65 - 99 mg/dL   BUN 12 8 - 27 mg/dL   Creatinine, Ser 9.14 0.57 - 1.00 mg/dL   GFR calc non Af Amer 81 >59 mL/min/1.73   GFR calc Af Amer 93 >59 mL/min/1.73   BUN/Creatinine Ratio 16 12 - 28   Sodium 140 134 -  144 mmol/L   Potassium 4.2 3.5 - 5.2 mmol/L   Chloride 101 96 - 106 mmol/L   CO2 27 20 - 29 mmol/L   Calcium 9.7 8.7 - 10.3 mg/dL   Total Protein 7.9 6.0 - 8.5 g/dL   Albumin 4.0 3.6 - 4.8 g/dL  Globulin, Total 3.9 1.5 - 4.5 g/dL   Albumin/Globulin Ratio 1.0 (L) 1.2 - 2.2   Bilirubin Total 0.3 0.0 - 1.2 mg/dL   Alkaline Phosphatase 118 (H) 39 - 117 IU/L   AST 18 0 - 40 IU/L   ALT 8 0 - 32 IU/L  TSH  Result Value Ref Range   TSH 6.600 (H) 0.450 - 4.500 uIU/mL  Lipid Panel w/o Chol/HDL Ratio  Result Value Ref Range   Cholesterol, Total 221 (H) 100 - 199 mg/dL   Triglycerides 161125 0 - 149 mg/dL   HDL 40 >09>39 mg/dL   VLDL Cholesterol Cal 25 5 - 40 mg/dL   LDL Calculated 604156 (H) 0 - 99 mg/dL  HgB V4UA1c  Result Value Ref Range   Hgb A1c MFr Bld 5.8 (H) 4.8 - 5.6 %   Est. average glucose Bld gHb Est-mCnc 120 mg/dL      Assessment & Plan:   Problem List Items Addressed This Visit      Respiratory   Sinusitis, acute frontal    Worsening symptoms since 02/09/18 with sinus pain.  Script for Augmentin liquid and Tussionex sent to pharmacy.  Discussed risks/benefits of cough medicine with patient.  She will take at night only.  Sinus rinses and humidifier at home.  Return for worsening or continued symptoms.      Relevant Medications   amoxicillin-clavulanate (AUGMENTIN) 250-62.5 MG/5ML suspension   chlorpheniramine-HYDROcodone (TUSSIONEX PENNKINETIC ER) 10-8 MG/5ML SUER       Follow up plan: Return if symptoms worsen or fail to improve.

## 2018-02-13 NOTE — Assessment & Plan Note (Addendum)
Worsening symptoms since 02/09/18 with sinus pain.  Script for Augmentin liquid and Tussionex sent to pharmacy.  Discussed risks/benefits of cough medicine with patient.  She will take at night only.  Sinus rinses and humidifier at home.  Return for worsening or continued symptoms.

## 2018-02-13 NOTE — Telephone Encounter (Signed)
Returned call to patient who states that she was seen Friday in the office for a cough.  She states now she has nasal stuffiness and is unsure if she is having fever but she is having periods of feeling chilled and then hot.  She states she has allergies and takes medication for allergies. She states her cough is better but now has sinus pressure and headache.  She sounds congested. She is concerned about work. 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 Appointment scheduled per protocol. Care advice read to patient. Pt verbalized understanding of all instructions.  Reason for Disposition . [1] Using nasal washes and pain medicine > 24 hours AND [2] sinus pain (around cheekbone or eye) persists  Answer Assessment - Initial Assessment Questions 1. ONSET: "When did the cough begin?"      Seen in office on friday 2. SEVERITY: "How bad is the cough today?"      No but sinus issues 3. RESPIRATORY DISTRESS: "Describe your breathing."      yes 4. FEVER: "Do you have a fever?" If so, ask: "What is your temperature, how was it measured, and when did it start?"     Hot and cold 5. SPUTUM: "Describe the color of your sputum" (clear, white, yellow, green)     Not sure 6. HEMOPTYSIS: "Are you coughing up any blood?" If so ask: "How much?" (flecks, streaks, tablespoons, etc.)     no 7. CARDIAC HISTORY: "Do you have any history of heart disease?" (e.g., heart attack, congestive heart failure)      no 8. LUNG HISTORY: "Do you have any history of lung disease?"  (e.g., pulmonary embolus, asthma, emphysema)     No just allergies 9. PE RISK FACTORS: "Do you have a history of blood clots?" (or: recent major surgery, recent prolonged travel, bedridden)     no 10. OTHER SYMPTOMS: "Do you have any other symptoms?" (e.g., runny nose, wheezing, chest pain)       Runny nose, chest hurts with cough 11. PREGNANCY: "Is there any chance you are pregnant?" "When was your last menstrual period?"    N/A 12. TRAVEL: "Have you traveled out of the country in the last month?" (e.g., travel history, exposures)       No  Protocols used: COUGH - ACUTE PRODUCTIVE-A-AH

## 2018-02-16 ENCOUNTER — Telehealth: Payer: Self-pay

## 2018-02-16 NOTE — Telephone Encounter (Signed)
Even though cough medication tussinex was discontinued because it needed a PA, I still proceeded with PA due to patient's sickness.  KEY: [id number]

## 2018-02-20 NOTE — Telephone Encounter (Signed)
Prior authorization was approved.

## 2018-03-13 ENCOUNTER — Encounter: Payer: Self-pay | Admitting: Nurse Practitioner

## 2018-03-13 ENCOUNTER — Other Ambulatory Visit: Payer: Self-pay | Admitting: Unknown Physician Specialty

## 2018-03-13 ENCOUNTER — Other Ambulatory Visit: Payer: Self-pay

## 2018-03-13 ENCOUNTER — Ambulatory Visit (INDEPENDENT_AMBULATORY_CARE_PROVIDER_SITE_OTHER): Payer: Medicare Other | Admitting: Nurse Practitioner

## 2018-03-13 VITALS — BP 109/67 | HR 99 | Temp 97.9°F | Ht 63.0 in | Wt 158.0 lb

## 2018-03-13 DIAGNOSIS — J029 Acute pharyngitis, unspecified: Secondary | ICD-10-CM | POA: Diagnosis not present

## 2018-03-13 MED ORDER — AMOXICILLIN 250 MG/5ML PO SUSR: 500.0000 mg | Freq: Two times a day (BID) | ORAL | 0 refills | Status: AC

## 2018-03-13 MED ORDER — PREDNISONE 10 MG PO TABS: 30.0000 mg | ORAL_TABLET | Freq: Every day | ORAL | 0 refills | Status: AC

## 2018-03-13 NOTE — Patient Instructions (Addendum)
Upper Respiratory Infection, Adult An upper respiratory infection (URI) affects the nose, throat, and upper air passages. URIs are caused by germs (viruses). The most common type of URI is often called "the common cold." Medicines cannot cure URIs, but you can do things at home to relieve your symptoms. URIs usually get better within 7-10 days. Follow these instructions at home: Activity  Rest as needed.  If you have a fever, stay home from work or school until your fever is gone, or until your doctor says you may return to work or school. ? You should stay home until you cannot spread the infection anymore (you are not contagious). ? Your doctor may have you wear a face mask so you have less risk of spreading the infection. Relieving symptoms  Gargle with a salt-water mixture 3-4 times a day or as needed. To make a salt-water mixture, completely dissolve -1 tsp of salt in 1 cup of warm water.  Use a cool-mist humidifier to add moisture to the air. This can help you breathe more easily. Eating and drinking   Drink enough fluid to keep your pee (urine) pale yellow.  Eat soups and other clear broths. General instructions   Take over-the-counter and prescription medicines only as told by your doctor. These include cold medicines, fever reducers, and cough suppressants.  Do not use any products that contain nicotine or tobacco. These include cigarettes and e-cigarettes. If you need help quitting, ask your doctor.  Avoid being where people are smoking (avoid secondhand smoke).  Make sure you get regular shots and get the flu shot every year.  Keep all follow-up visits as told by your doctor. This is important. How to avoid spreading infection to others   Wash your hands often with soap and water. If you do not have soap and water, use hand sanitizer.  Avoid touching your mouth, face, eyes, or nose.  Cough or sneeze into a tissue or your sleeve or elbow. Do not cough or sneeze  into your hand or into the air. Contact a doctor if:  You are getting worse, not better.  You have any of these: ? A fever. ? Chills. ? Brown or red mucus in your nose. ? Yellow or brown fluid (discharge)coming from your nose. ? Pain in your face, especially when you bend forward. ? Swollen neck glands. ? Pain with swallowing. ? White areas in the back of your throat. Get help right away if:  You have shortness of breath that gets worse.  You have very bad or constant: ? Headache. ? Ear pain. ? Pain in your forehead, behind your eyes, and over your cheekbones (sinus pain). ? Chest pain.  You have long-lasting (chronic) lung disease along with any of these: ? Wheezing. ? Long-lasting cough. ? Coughing up blood. ? A change in your usual mucus.  You have a stiff neck.  You have changes in your: ? Vision. ? Hearing. ? Thinking. ? Mood. Summary  An upper respiratory infection (URI) is caused by a germ called a virus. The most common type of URI is often called "the common cold."  URIs usually get better within 7-10 days.  Take over-the-counter and prescription medicines only as told by your doctor. This information is not intended to replace advice given to you by your health care provider. Make sure you discuss any questions you have with your health care provider. Document Released: 07/21/2007 Document Revised: 09/24/2016 Document Reviewed: 09/24/2016 Elsevier Interactive Patient Education  2019 [employer].  Sore Throat When you have a sore throat, your throat may feel:  Tender.  Burning.  Irritated.  Scratchy.  Painful when you swallow.  Painful when you talk. Many things can cause a sore throat, such as:  An infection.  Allergies.  Dry air.  Smoke or pollution.  Radiation treatment.  Gastroesophageal reflux disease (GERD).  A tumor. A sore throat can be the first sign of another sickness. It can happen with other problems,  like:  Coughing.  Sneezing.  Fever.  Swelling in the neck. Most sore throats go away without treatment. Follow these instructions at home:      Take over-the-counter medicines only as told by your doctor. ? If your child has a sore throat, do not give your child aspirin.  Drink enough fluids to keep your pee (urine) pale yellow.  Rest when you feel you need to.  To help with pain: ? Sip warm liquids, such as broth, herbal tea, or warm water. ? Eat or drink cold or frozen liquids, such as frozen ice pops. ? Gargle with a salt-water mixture 3-4 times a day or as needed. To make a salt-water mixture, add -1 tsp (3-6 g) of salt to 1 cup (237 mL) of warm water. Mix it until you cannot see the salt anymore. ? Suck on hard candy or throat lozenges. ? Put a cool-mist humidifier in your bedroom at night. ? Sit in the bathroom with the door closed for 5-10 minutes while you run hot water in the shower.  Do not use any products that contain nicotine or tobacco, such as cigarettes, e-cigarettes, and chewing tobacco. If you need help quitting, ask your doctor.  Wash your hands well and often with soap and water. If soap and water are not available, use hand sanitizer. Contact a doctor if:  You have a fever for more than 2-3 days.  You keep having symptoms for more than 2-3 days.  Your throat does not get better in 7 days.  You have a fever and your symptoms suddenly get worse.  Your child who is 3 months to 66 years old has a temperature of 102.76F (39C) or higher. Get help right away if:  You have trouble breathing.  You cannot swallow fluids, soft foods, or your saliva.  You have swelling in your throat or neck that gets worse.  You keep feeling sick to your stomach (nauseous).  You keep throwing up (vomiting). Summary  A sore throat is pain, burning, irritation, or scratchiness in the throat. Many things can cause a sore throat.  Take over-the-counter medicines only  as told by your doctor. Do not give your child aspirin.  Drink plenty of fluids, and rest as needed.  Contact a doctor if your symptoms get worse or your sore throat does not get better within 7 days. This information is not intended to replace advice given to you by your health care provider. Make sure you discuss any questions you have with your health care provider. Document Released: 11/11/2007 Document Revised: 07/04/2017 Document Reviewed: 07/04/2017 Elsevier Interactive Patient Education  2019 [employer].

## 2018-03-13 NOTE — Assessment & Plan Note (Signed)
Flu negative and strep negative (although unable to obtain adequate swab d/t patient movement).  Based on physical findings will send script for Amoxicillin 500 MG BID (liquid, as patient can not swallow pills) + Prednisone.  Recommend salt water gargles and Tylenol/Motrin at home as needed for discomfort.  Mucinex for cough as needed.  Continue allergy medication and Flonase.  Return for worsening or continue symptoms.

## 2018-03-13 NOTE — Progress Notes (Signed)
BP 109/67   Pulse 99   Temp 97.9 F (36.6 C) (Oral)   Ht 5\' 3"  (1.6 m)   Wt 158 lb (71.7 kg)   LMP  (LMP Unknown)   SpO2 95%   BMI 27.99 kg/m    Subjective:    Patient ID: Carol [name], female    DOB: 1950-11-18, 68 y.o.   MRN: [id number]  HPI: Carol [name] is a 68 y.o. female presents for acute visit  Chief Complaint  Patient presents with  . Sore Throat    since Friday   . Cough  . Nasal Congestion  . Emesis    pt states threw up on Saturday night   UPPER RESPIRATORY TRACT INFECTION Initial symptoms started on Friday, sore throat primary.  Attempt to obtain strep and flu swabs today, patient moved throughout attempts.  Saturday night had bouts of emesis and reports this improved Sunday.  Reports she ran out of her allergy pills and Flonase on Friday, have not come from [facility] yet. Worst symptom: sore throat Fever: had chills the weekend, but did not take temperature Cough: yes Shortness of breath: no Wheezing: no Chest pain: no Chest tightness: yes Chest congestion: yes Nasal congestion: yes Runny nose: yes Post nasal drip: yes Sneezing: no Sore throat: yes Swollen glands: no Sinus pressure: no Headache: no Face pain: no Toothache: no Ear pain: none Ear pressure: none Eyes red/itching:no Eye drainage/crusting: no  Vomiting: yes Rash: no Fatigue: yes Sick contacts: no Strep contacts: no  Context: fluctuating Recurrent sinusitis: no Relief with OTC cold/cough medications: no  Treatments attempted: mucinex   Relevant past medical, surgical, family and social history reviewed and updated as indicated. Interim medical history since our last visit reviewed. Allergies and medications reviewed and updated.  Review of Systems  Constitutional: Positive for fatigue. Negative for activity change, appetite change and fever.  HENT: Positive for congestion, postnasal drip, rhinorrhea and sore throat. Negative for ear discharge, ear pain, facial swelling,  sinus pressure, sinus pain, sneezing and voice change.   Eyes: Negative for pain and visual disturbance.  Respiratory: Positive for cough and chest tightness. Negative for shortness of breath and wheezing.   Cardiovascular: Negative for chest pain, palpitations and leg swelling.  Gastrointestinal: Negative for abdominal distention, abdominal pain, constipation, diarrhea, nausea and vomiting.  Endocrine: Negative.   Musculoskeletal: Negative for myalgias.  Neurological: Negative for dizziness, numbness and headaches.  Psychiatric/Behavioral: Negative.     Per HPI unless specifically indicated above     Objective:    BP 109/67   Pulse 99   Temp 97.9 F (36.6 C) (Oral)   Ht 5\' 3"  (1.6 m)   Wt 158 lb (71.7 kg)   LMP  (LMP Unknown)   SpO2 95%   BMI 27.99 kg/m   Wt Readings from Last 3 Encounters:  03/13/18 158 lb (71.7 kg)  02/13/18 159 lb (72.1 kg)  02/09/18 159 lb (72.1 kg)    Physical Exam Vitals signs and nursing note reviewed.  Constitutional:      General: She is awake.     Appearance: She is well-developed. She is ill-appearing.  HENT:     Head: Normocephalic. No raccoon eyes.     Right Ear: Hearing, tympanic membrane, ear canal and external ear normal.     Left Ear: Hearing, tympanic membrane, ear canal and external ear normal.     Nose: Mucosal edema and rhinorrhea present. Rhinorrhea is clear.     Right Sinus: No maxillary  sinus tenderness or frontal sinus tenderness.     Left Sinus: No maxillary sinus tenderness or frontal sinus tenderness.     Mouth/Throat:     Mouth: Mucous membranes are moist.     Pharynx: Pharyngeal swelling (mild) and posterior oropharyngeal erythema (beefy red) present. No oropharyngeal exudate.     Tonsils: Swelling: 1+ on the right. 1+ on the left.  Eyes:     General: Lids are normal.        Right eye: No discharge.        Left eye: No discharge.     Conjunctiva/sclera: Conjunctivae normal.     Pupils: Pupils are equal, round, and  reactive to light.  Neck:     Musculoskeletal: Normal range of motion and neck supple.     Thyroid: No thyromegaly.     Vascular: No carotid bruit or JVD.  Cardiovascular:     Rate and Rhythm: Normal rate and regular rhythm.     Heart sounds: Normal heart sounds. No murmur. No gallop.   Pulmonary:     Effort: Pulmonary effort is normal.     Breath sounds: Normal breath sounds.  Abdominal:     General: Bowel sounds are normal.     Palpations: Abdomen is soft. There is no hepatomegaly or splenomegaly.  Musculoskeletal:     Right lower leg: No edema.     Left lower leg: No edema.  Lymphadenopathy:     Head:     Right side of head: Tonsillar adenopathy present. No submental or submandibular adenopathy.     Left side of head: Tonsillar adenopathy present. No submental or submandibular adenopathy.     Cervical: No cervical adenopathy.  Skin:    General: Skin is warm and dry.  Neurological:     Mental Status: She is alert and oriented to person, place, and time.  Psychiatric:        Attention and Perception: Attention normal.        Mood and Affect: Mood normal.        Behavior: Behavior normal. Behavior is cooperative.        Thought Content: Thought content normal.        Judgment: Judgment normal.     Results for orders placed or performed in visit on 12/14/17  CBC with Differential/Platelet  Result Value Ref Range   WBC 4.1 3.4 - 10.8 x10E3/uL   RBC 4.41 3.77 - 5.28 x10E6/uL   Hemoglobin 12.6 11.1 - 15.9 g/dL   Hematocrit 11.9 41.7 - 46.6 %   MCV 86 79 - 97 fL   MCH 28.6 26.6 - 33.0 pg   MCHC 33.1 31.5 - 35.7 g/dL   RDW 40.8 14.4 - 81.8 %   Platelets 537 (H) 150 - 450 x10E3/uL   Neutrophils 41 Not Estab. %   Lymphs 43 Not Estab. %   Monocytes 12 Not Estab. %   Eos 3 Not Estab. %   Basos 1 Not Estab. %   Neutrophils Absolute 1.7 1.4 - 7.0 x10E3/uL   Lymphocytes Absolute 1.8 0.7 - 3.1 x10E3/uL   Monocytes Absolute 0.5 0.1 - 0.9 x10E3/uL   EOS (ABSOLUTE) 0.1 0.0 - 0.4  x10E3/uL   Basophils Absolute 0.0 0.0 - 0.2 x10E3/uL   Immature Granulocytes 0 Not Estab. %   Immature Grans (Abs) 0.0 0.0 - 0.1 x10E3/uL  Comprehensive metabolic panel  Result Value Ref Range   Glucose 93 65 - 99 mg/dL   BUN 12 8 - 27 mg/dL  Creatinine, Ser 0.77 0.57 - 1.00 mg/dL   GFR calc non Af Amer 81 >59 mL/min/1.73   GFR calc Af Amer 93 >59 mL/min/1.73   BUN/Creatinine Ratio 16 12 - 28   Sodium 140 134 - 144 mmol/L   Potassium 4.2 3.5 - 5.2 mmol/L   Chloride 101 96 - 106 mmol/L   CO2 27 20 - 29 mmol/L   Calcium 9.7 8.7 - 10.3 mg/dL   Total Protein 7.9 6.0 - 8.5 g/dL   Albumin 4.0 3.6 - 4.8 g/dL   Globulin, Total 3.9 1.5 - 4.5 g/dL   Albumin/Globulin Ratio 1.0 (L) 1.2 - 2.2   Bilirubin Total 0.3 0.0 - 1.2 mg/dL   Alkaline Phosphatase 118 (H) 39 - 117 IU/L   AST 18 0 - 40 IU/L   ALT 8 0 - 32 IU/L  TSH  Result Value Ref Range   TSH 6.600 (H) 0.450 - 4.500 uIU/mL  Lipid Panel w/o Chol/HDL Ratio  Result Value Ref Range   Cholesterol, Total 221 (H) 100 - 199 mg/dL   Triglycerides 045125 0 - 149 mg/dL   HDL 40 >40>39 mg/dL   VLDL Cholesterol Cal 25 5 - 40 mg/dL   LDL Calculated 981156 (H) 0 - 99 mg/dL  HgB [id number]  Result Value Ref Range   Hgb A1c MFr Bld 5.8 (H) 4.8 - 5.6 %   Est. average glucose Bld gHb Est-mCnc 120 mg/dL      Assessment & Plan:   Problem List Items Addressed This Visit      Other   Sore throat - Primary    Flu negative and strep negative (although unable to obtain adequate swab d/t patient movement).  Based on physical findings will send script for Amoxicillin 500 MG BID (liquid, as patient can not swallow pills) + Prednisone.  Recommend salt water gargles and Tylenol/Motrin at home as needed for discomfort.  Mucinex for cough as needed.  Continue allergy medication and Flonase.  Return for worsening or continue symptoms.      Relevant Orders   Veritor Flu A/B Waived   Rapid Strep Screen ([facility] ONLY)       Follow up plan: Return if symptoms  worsen or fail to improve.

## 2018-03-16 ENCOUNTER — Ambulatory Visit: Payer: Medicare Other | Admitting: Nurse Practitioner

## 2018-03-16 LAB — VERITOR FLU A/B WAIVED
INFLUENZA A: NEGATIVE
Influenza B: NEGATIVE

## 2018-03-16 LAB — RAPID STREP SCREEN (MED CTR MEBANE ONLY): STREP GP A AG, IA W/REFLEX: NEGATIVE

## 2018-03-16 LAB — CULTURE, GROUP A STREP: STREP A CULTURE: NEGATIVE

## 2018-03-23 ENCOUNTER — Ambulatory Visit: Payer: Medicare Other

## 2018-04-12 ENCOUNTER — Encounter: Payer: Self-pay | Admitting: Nurse Practitioner

## 2018-04-14 ENCOUNTER — Encounter: Payer: Self-pay | Admitting: Nurse Practitioner

## 2018-04-14 ENCOUNTER — Ambulatory Visit (INDEPENDENT_AMBULATORY_CARE_PROVIDER_SITE_OTHER): Payer: Medicare Other | Admitting: Nurse Practitioner

## 2018-04-14 VITALS — BP 113/70 | HR 75 | Temp 97.5°F | Ht 63.0 in | Wt 161.0 lb

## 2018-04-14 DIAGNOSIS — E785 Hyperlipidemia, unspecified: Secondary | ICD-10-CM

## 2018-04-14 DIAGNOSIS — Z780 Asymptomatic menopausal state: Secondary | ICD-10-CM

## 2018-04-14 DIAGNOSIS — I10 Essential (primary) hypertension: Secondary | ICD-10-CM

## 2018-04-14 DIAGNOSIS — M67432 Ganglion, left wrist: Secondary | ICD-10-CM

## 2018-04-14 DIAGNOSIS — R7303 Prediabetes: Secondary | ICD-10-CM | POA: Insufficient documentation

## 2018-04-14 DIAGNOSIS — R7309 Other abnormal glucose: Secondary | ICD-10-CM | POA: Insufficient documentation

## 2018-04-14 MED ORDER — ATORVASTATIN CALCIUM 10 MG PO TABS: 10.0000 mg | ORAL_TABLET | Freq: Every day | ORAL | 5 refills | Status: DC

## 2018-04-14 MED ORDER — NYSTATIN 100000 UNIT/GM EX CREA: 1.0000 "application " | TOPICAL_CREAM | CUTANEOUS | 1 refills | Status: DC | PRN

## 2018-04-14 NOTE — Patient Instructions (Signed)
Hypothyroidism  Hypothyroidism is when the thyroid gland does not make enough of certain hormones (it is underactive). The thyroid gland is a small gland located in the lower front part of the neck, just in front of the windpipe (trachea). This gland makes hormones that help control how the body uses food for energy (metabolism) as well as how the heart and brain function. These hormones also play a role in keeping your bones strong. When the thyroid is underactive, it produces too little of the hormones thyroxine (T4) and triiodothyronine (T3). What are the causes? This condition may be caused by:  Hashimoto's disease. This is a disease in which the body's disease-fighting system (immune system) attacks the thyroid gland. This is the most common cause.  Viral infections.  Pregnancy.  Certain medicines.  Birth defects.  Past radiation treatments to the head or neck for cancer.  Past treatment with radioactive iodine.  Past exposure to radiation in the environment.  Past surgical removal of part or all of the thyroid.  Problems with a gland in the center of the brain (pituitary gland).  Lack of enough iodine in the diet. What increases the risk? You are more likely to develop this condition if:  You are female.  You have a family history of thyroid conditions.  You use a medicine called lithium.  You take medicines that affect the immune system (immunosuppressants). What are the signs or symptoms? Symptoms of this condition include:  Feeling as though you have no energy (lethargy).  Not being able to tolerate cold.  Weight gain that is not explained by a change in diet or exercise habits.  Lack of appetite.  Dry skin.  Coarse hair.  Menstrual irregularity.  Slowing of thought processes.  Constipation.  Sadness or depression. How is this diagnosed? This condition may be diagnosed based on:  Your symptoms, your medical history, and a physical exam.  Blood  tests. You may also have imaging tests, such as an ultrasound or MRI. How is this treated? This condition is treated with medicine that replaces the thyroid hormones that your body does not make. After you begin treatment, it may take several weeks for symptoms to go away. Follow these instructions at home:  Take over-the-counter and prescription medicines only as told by your health care provider.  If you start taking any new medicines, tell your health care provider.  Keep all follow-up visits as told by your health care provider. This is important. ? As your condition improves, your dosage of thyroid hormone medicine may change. ? You will need to have blood tests regularly so that your health care provider can monitor your condition. Contact a health care provider if:  Your symptoms do not get better with treatment.  You are taking thyroid replacement medicine and you: ? Sweat a lot. ? Have tremors. ? Feel anxious. ? Lose weight rapidly. ? Cannot tolerate heat. ? Have emotional swings. ? Have diarrhea. ? Feel weak. Get help right away if you have:  Chest pain.  An irregular heartbeat.  A rapid heartbeat.  Difficulty breathing. Summary  Hypothyroidism is when the thyroid gland does not make enough of certain hormones (it is underactive).  When the thyroid is underactive, it produces too little of the hormones thyroxine (T4) and triiodothyronine (T3).  The most common cause is Hashimoto's disease, a disease in which the body's disease-fighting system (immune system) attacks the thyroid gland. The condition can also be caused by viral infections, medicine, pregnancy, or past   radiation treatment to the head or neck.  Symptoms may include weight gain, dry skin, constipation, feeling as though you do not have energy, and not being able to tolerate cold.  This condition is treated with medicine to replace the thyroid hormones that your body does not make. This information  is not intended to replace advice given to you by your health care provider. Make sure you discuss any questions you have with your health care provider. Document Released: 02/01/2005 Document Revised: 01/12/2017 Document Reviewed: 01/12/2017 Elsevier Interactive Patient Education  2019 [employer]  

## 2018-04-14 NOTE — Progress Notes (Signed)
BP 113/70 (BP Location: Left Arm, Patient Position: Sitting, Cuff Size: Normal)   Pulse 75   Temp (!) 97.5 F (36.4 C) (Oral)   Ht 5\' 3"  (1.6 m)   Wt 161 lb (73 kg)   LMP  (LMP Unknown)   SpO2 97%   BMI 28.52 kg/m    Subjective:    Patient ID: Carol [name], female    DOB: 17-Nov-1950, 68 y.o.   MRN: [id number]  HPI: Carol [name] is a 68 y.o. female  Chief Complaint  Patient presents with  . Hypertension    3 month F/U. No complaints.  Lavena Bullion Cyst   HYPERTENSION Continues on Amlodipine and ASA. Hypertension status: stable  Satisfied with current treatment? yes Duration of hypertension: years BP monitoring frequency:  not checking BP range:  BP medication side effects:  no Medication compliance: good compliance Aspirin: yes Recurrent headaches: no Visual changes: no Palpitations: no Dyspnea: no Chest pain: no Lower extremity edema: no Dizzy/lightheaded: no   GANGLION CYST: Has been present for several months, reports area has decreased in size.  Denies pain to area, numbness, or difficulty with ADLs.  Wishes to monitor at this time.  Refuses u/s of area.  HYPERLIPIDEMIA Hyperlipidemia status: no current treatmen Satisfied with current treatment?  No current treatment, discussed today Aspirin:  no  Chest pain:  no Coronary artery disease:  no Family history CAD:  no Family history early CAD:  no The 10-year ASCVD risk score Denman George DC Jr., et al., 2013) is: 8.4%   Values used to calculate the score:     Age: 74 years     Sex: Female     Is Non-Hispanic African American: Yes     Diabetic: No     Tobacco smoker: No     Systolic Blood Pressure: 113 mmHg     Is BP treated: Yes     HDL Cholesterol: 40 mg/dL     Total Cholesterol: 221 mg/dL  Relevant past medical, surgical, family and social history reviewed and updated as indicated. Interim medical history since our last visit reviewed. Allergies and medications reviewed and updated.  Review of  Systems  Constitutional: Negative for activity change, appetite change, diaphoresis, fatigue and fever.  Respiratory: Negative for cough, chest tightness and shortness of breath.   Cardiovascular: Negative for chest pain, palpitations and leg swelling.  Gastrointestinal: Negative for abdominal distention, abdominal pain, constipation, diarrhea, nausea and vomiting.  Endocrine: Negative for cold intolerance, heat intolerance, polydipsia, polyphagia and polyuria.  Neurological: Negative for dizziness, syncope, weakness, light-headedness, numbness and headaches.  Psychiatric/Behavioral: Negative.     Per HPI unless specifically indicated above     Objective:    BP 113/70 (BP Location: Left Arm, Patient Position: Sitting, Cuff Size: Normal)   Pulse 75   Temp (!) 97.5 F (36.4 C) (Oral)   Ht 5\' 3"  (1.6 m)   Wt 161 lb (73 kg)   LMP  (LMP Unknown)   SpO2 97%   BMI 28.52 kg/m   Wt Readings from Last 3 Encounters:  04/14/18 161 lb (73 kg)  03/13/18 158 lb (71.7 kg)  02/13/18 159 lb (72.1 kg)    Physical Exam Vitals signs and nursing note reviewed.  Constitutional:      Appearance: She is well-developed.  HENT:     Head: Normocephalic.  Eyes:     General:        Right eye: No discharge.        Left  eye: No discharge.     Conjunctiva/sclera: Conjunctivae normal.     Pupils: Pupils are equal, round, and reactive to light.  Neck:     Musculoskeletal: Normal range of motion and neck supple.     Thyroid: No thyromegaly.     Vascular: No carotid bruit or JVD.  Cardiovascular:     Rate and Rhythm: Normal rate and regular rhythm.     Heart sounds: Normal heart sounds. No murmur. No gallop.   Pulmonary:     Effort: Pulmonary effort is normal.     Breath sounds: Normal breath sounds.  Abdominal:     General: Bowel sounds are normal.     Palpations: Abdomen is soft.  Musculoskeletal:     Right lower leg: No edema.     Left lower leg: No edema.  Lymphadenopathy:     Cervical: No  cervical adenopathy.  Skin:    General: Skin is warm and dry.       Neurological:     Mental Status: She is alert and oriented to person, place, and time.  Psychiatric:        Mood and Affect: Mood normal.        Behavior: Behavior normal.        Thought Content: Thought content normal.        Judgment: Judgment normal.     Results for orders placed or performed in visit on 03/13/18  Rapid Strep Screen ([facility] ONLY)  Result Value Ref Range   Strep Gp A Ag, IA W/Reflex Negative Negative  Culture, Group A Strep  Result Value Ref Range   Strep A Culture Negative   Veritor Flu A/B Waived  Result Value Ref Range   Influenza A Negative Negative   Influenza B Negative Negative      Assessment & Plan:   Problem List Items Addressed This Visit      Cardiovascular and Mediastinum   Hypertension - Primary    Chronic, ongoing.  Continue current medication regimen.  Labs next visit.  BP below goal.      Relevant Medications   atorvastatin (LIPITOR) 10 MG tablet     Other   Hyperlipidemia    Chronic with previous ASCVD 8.4%.  Discussed statin with patient.  She agrees to trial it.  Will start with Lipitor 10 MG (to monitor affects and smaller pill).  Return in 8 weeks for labs and visit.      Relevant Medications   atorvastatin (LIPITOR) 10 MG tablet   Ganglion of left wrist    Decreased in size, no tenderness.  Refuses imaging.  Continue to monitor.       Other Visit Diagnoses    Postmenopausal estrogen deficiency       Relevant Orders   DG Bone Density       Follow up plan: Return in about 8 weeks (around 06/09/2018) for HTN/HLD.

## 2018-04-14 NOTE — Assessment & Plan Note (Signed)
Chronic, ongoing.  Continue current medication regimen.  Labs next visit.  BP below goal.

## 2018-04-14 NOTE — Assessment & Plan Note (Signed)
Decreased in size, no tenderness.  Refuses imaging.  Continue to monitor.

## 2018-04-14 NOTE — Assessment & Plan Note (Signed)
Chronic with previous ASCVD 8.4%.  Discussed statin with patient.  She agrees to trial it.  Will start with Lipitor 10 MG (to monitor affects and smaller pill).  Return in 8 weeks for labs and visit.

## 2018-04-18 ENCOUNTER — Ambulatory Visit: Payer: Self-pay

## 2018-04-18 NOTE — Telephone Encounter (Signed)
Patient called and she says that she's taking Lipitor and having pain in her knees. She says she just wants to know if she can take Mobic with Lipitor for her knee pain. I advised I will send her question to [facility] and someone will call with her recommendation.    Reason for Disposition . Caller has NON-URGENT medication question about med that PCP prescribed and triager unable to answer question  Protocols used: MEDICATION QUESTION CALL-A-AH

## 2018-04-18 NOTE — Telephone Encounter (Signed)
Patient called, left message with the gentleman who answered the phone to have her call the office, he verbalized understanding.

## 2018-04-18 NOTE — Telephone Encounter (Signed)
Spoke to patient via telephone.  Discussed she could take Tylenol for discomfort, increase hydration, and switch to taking Lipitor every other day at this time.  If continued discomfort she is to call me and we will switch to alternate statin, like Crestor and 3 days a week.

## 2018-04-19 LAB — FECAL OCCULT BLOOD, IMMUNOCHEMICAL: IFOBT: NEGATIVE

## 2018-04-28 ENCOUNTER — Ambulatory Visit
Admission: RE | Admit: 2018-04-28 | Discharge: 2018-04-28 | Disposition: A | Discharge status: Home / Self Care | Payer: Medicare Other | Source: Ambulatory Visit | Attending: Nurse Practitioner | Admitting: Nurse Practitioner

## 2018-04-28 ENCOUNTER — Encounter: Payer: Self-pay | Admitting: Nurse Practitioner

## 2018-04-28 ENCOUNTER — Other Ambulatory Visit: Payer: Self-pay

## 2018-04-28 ENCOUNTER — Ambulatory Visit (INDEPENDENT_AMBULATORY_CARE_PROVIDER_SITE_OTHER): Payer: Medicare Other | Admitting: Nurse Practitioner

## 2018-04-28 VITALS — BP 115/75 | HR 80 | Temp 97.4°F | Ht 63.0 in | Wt 160.0 lb

## 2018-04-28 DIAGNOSIS — M25561 Pain in right knee: Secondary | ICD-10-CM | POA: Diagnosis present

## 2018-04-28 DIAGNOSIS — M1990 Unspecified osteoarthritis, unspecified site: Secondary | ICD-10-CM

## 2018-04-28 DIAGNOSIS — M67432 Ganglion, left wrist: Secondary | ICD-10-CM | POA: Diagnosis not present

## 2018-04-28 DIAGNOSIS — J069 Acute upper respiratory infection, unspecified: Secondary | ICD-10-CM

## 2018-04-28 NOTE — Assessment & Plan Note (Signed)
With increase in right knee pain.  Will obtain imaging and refer to ortho as patient is interested in gel placement, which was mentioned to her in past.  Recommend continue simple treatment at home and stretches.

## 2018-04-28 NOTE — Assessment & Plan Note (Signed)
Slightly larger today and reports discomfort at times.  Referral to ortho for possible draining.

## 2018-04-28 NOTE — Patient Instructions (Signed)
STOP Claritin and try Allegra or Zyrtec.  Osteoarthritis  Osteoarthritis is a type of arthritis that affects tissue that covers the ends of bones in joints (cartilage). Cartilage acts as a cushion between the bones and helps them move smoothly. Osteoarthritis results when cartilage in the joints gets worn down. Osteoarthritis is sometimes called "wear and tear" arthritis. Osteoarthritis is the most common form of arthritis. It often occurs in older people. It is a condition that gets worse over time (a progressive condition). Joints that are most often affected by this condition are in:  Fingers.  Toes.  Hips.  Knees.  Spine, including neck and lower back. What are the causes? This condition is caused by age-related wearing down of cartilage that covers the ends of bones. What increases the risk? The following factors may make you more likely to develop this condition:  Older age.  Being overweight or obese.  Overuse of joints, such as in athletes.  Past injury of a joint.  Past surgery on a joint.  Family history of osteoarthritis. What are the signs or symptoms? The main symptoms of this condition are pain, swelling, and stiffness in the joint. The joint may lose its shape over time. Small pieces of bone or cartilage may break off and float inside of the joint, which may cause more pain and damage to the joint. Small deposits of bone (osteophytes) may grow on the edges of the joint. Other symptoms may include:  A grating or scraping feeling inside the joint when you move it.  Popping or creaking sounds when you move. Symptoms may affect one or more joints. Osteoarthritis in a major joint, such as your knee or hip, can make it painful to walk or exercise. If you have osteoarthritis in your hands, you might not be able to grip items, twist your hand, or control small movements of your hands and fingers (fine motor skills). How is this diagnosed? This condition may be diagnosed  based on:  Your medical history.  A physical exam.  Your symptoms.  X-rays of the affected joint(s).  Blood tests to rule out other types of arthritis. How is this treated? There is no cure for this condition, but treatment can help to control pain and improve joint function. Treatment plans may include:  A prescribed exercise program that allows for rest and joint relief. You may work with a physical therapist.  A weight control plan.  Pain relief techniques, such as: ? Applying heat and cold to the joint. ? Electric pulses delivered to nerve endings under the skin (transcutaneous electrical nerve stimulation, or TENS). ? Massage. ? Certain nutritional supplements.  NSAIDs or prescription medicines to help relieve pain.  Medicine to help relieve pain and inflammation (corticosteroids). This can be given by mouth (orally) or as an injection.  Assistive devices, such as a brace, wrap, splint, specialized glove, or cane.  Surgery, such as: ? An osteotomy. This is done to reposition the bones and relieve pain or to remove loose pieces of bone and cartilage. ? Joint replacement surgery. You may need this surgery if you have very bad (advanced) osteoarthritis. Follow these instructions at home: Activity  Rest your affected joints as directed by your health care provider.  Do not drive or use heavy machinery while taking prescription pain medicine.  Exercise as directed. Your health care provider or physical therapist may recommend specific types of exercise, such as: ? Strengthening exercises. These are done to strengthen the muscles that support joints that  are affected by arthritis. They can be performed with weights or with exercise bands to add resistance. ? Aerobic activities. These are exercises, such as brisk walking or water aerobics, that get your heart pumping. ? Range-of-motion activities. These keep your joints easy to move. ? Balance and agility exercises.  Managing pain, stiffness, and swelling      If directed, apply heat to the affected area as often as told by your health care provider. Use the heat source that your health care provider recommends, such as a moist heat pack or a heating pad. ? If you have a removable assistive device, remove it as told by your health care provider. ? Place a towel between your skin and the heat source. If your health care provider tells you to keep the assistive device on while you apply heat, place a towel between the assistive device and the heat source. ? Leave the heat on for 20-30 minutes. ? Remove the heat if your skin turns bright red. This is especially important if you are unable to feel pain, heat, or cold. You may have a greater risk of getting burned.  If directed, put ice on the affected joint: ? If you have a removable assistive device, remove it as told by your health care provider. ? Put ice in a plastic bag. ? Place a towel between your skin and the bag. If your health care provider tells you to keep the assistive device on during icing, place a towel between the assistive device and the bag. ? Leave the ice on for 20 minutes, 2-3 times a day. General instructions  Take over-the-counter and prescription medicines only as told by your health care provider.  Maintain a healthy weight. Follow instructions from your health care provider for weight control. These may include dietary restrictions.  Do not use any products that contain nicotine or tobacco, such as cigarettes and e-cigarettes. These can delay bone healing. If you need help quitting, ask your health care provider.  Use assistive devices as directed by your health care provider.  Keep all follow-up visits as told by your health care provider. This is important. Where to find more information  [employer] of Arthritis and Musculoskeletal and Skin Diseases: www.niams.http://www.myers.net/  [employer] on Aging: https://walker.com/   American College of Rheumatology: www.rheumatology.org Contact a health care provider if:  Your skin turns red.  You develop a rash.  You have pain that gets worse.  You have a fever along with joint or muscle aches. Get help right away if:  You lose a lot of weight.  You suddenly lose your appetite.  You have night sweats. Summary  Osteoarthritis is a type of arthritis that affects tissue covering the ends of bones in joints (cartilage).  This condition is caused by age-related wearing down of cartilage that covers the ends of bones.  The main symptom of this condition is pain, swelling, and stiffness in the joint.  There is no cure for this condition, but treatment can help to control pain and improve joint function. This information is not intended to replace advice given to you by your health care provider. Make sure you discuss any questions you have with your health care provider. Document Released: 02/01/2005 Document Revised: 11/08/2016 Document Reviewed: 10/06/2015 Elsevier Interactive Patient Education  2019 [employer]vier Inc.  Upper Respiratory Infection, Adult An upper respiratory infection (URI) affects the nose, throat, and upper air passages. URIs are caused by germs (viruses). The most common type of URI  is often called "the common cold." Medicines cannot cure URIs, but you can do things at home to relieve your symptoms. URIs usually get better within 7-10 days. Follow these instructions at home: Activity  Rest as needed.  If you have a fever, stay home from work or school until your fever is gone, or until your doctor says you may return to work or school. ? You should stay home until you cannot spread the infection anymore (you are not contagious). ? Your doctor may have you wear a face mask so you have less risk of spreading the infection. Relieving symptoms  Gargle with a salt-water mixture 3-4 times a day or as needed. To make a salt-water mixture,  completely dissolve -1 tsp of salt in 1 cup of warm water.  Use a cool-mist humidifier to add moisture to the air. This can help you breathe more easily. Eating and drinking   Drink enough fluid to keep your pee (urine) pale yellow.  Eat soups and other clear broths. General instructions   Take over-the-counter and prescription medicines only as told by your doctor. These include cold medicines, fever reducers, and cough suppressants.  Do not use any products that contain nicotine or tobacco. These include cigarettes and e-cigarettes. If you need help quitting, ask your doctor.  Avoid being where people are smoking (avoid secondhand smoke).  Make sure you get regular shots and get the flu shot every year.  Keep all follow-up visits as told by your doctor. This is important. How to avoid spreading infection to others   Wash your hands often with soap and water. If you do not have soap and water, use hand sanitizer.  Avoid touching your mouth, face, eyes, or nose.  Cough or sneeze into a tissue or your sleeve or elbow. Do not cough or sneeze into your hand or into the air. Contact a doctor if:  You are getting worse, not better.  You have any of these: ? A fever. ? Chills. ? Brown or red mucus in your nose. ? Yellow or brown fluid (discharge)coming from your nose. ? Pain in your face, especially when you bend forward. ? Swollen neck glands. ? Pain with swallowing. ? White areas in the back of your throat. Get help right away if:  You have shortness of breath that gets worse.  You have very bad or constant: ? Headache. ? Ear pain. ? Pain in your forehead, behind your eyes, and over your cheekbones (sinus pain). ? Chest pain.  You have long-lasting (chronic) lung disease along with any of these: ? Wheezing. ? Long-lasting cough. ? Coughing up blood. ? A change in your usual mucus.  You have a stiff neck.  You have changes in your: ? Vision. ? Hearing. ?  Thinking. ? Mood. Summary  An upper respiratory infection (URI) is caused by a germ called a virus. The most common type of URI is often called "the common cold."  URIs usually get better within 7-10 days.  Take over-the-counter and prescription medicines only as told by your doctor. This information is not intended to replace advice given to you by your health care provider. Make sure you discuss any questions you have with your health care provider. Document Released: 07/21/2007 Document Revised: 09/24/2016 Document Reviewed: 09/24/2016 Elsevier Interactive Patient Education  2019 [employer].

## 2018-04-28 NOTE — Progress Notes (Signed)
BP 115/75   Pulse 80   Temp (!) 97.4 F (36.3 C) (Oral)   Ht 5\' 3"  (1.6 m)   Wt 160 lb (72.6 kg)   LMP  (LMP Unknown)   SpO2 98%   BMI 28.34 kg/m    Subjective:    Patient ID: Carol [name], female    DOB: 09/05/1950, 68 y.o.   MRN: [id number]  HPI: Carol [name] is a 68 y.o. female  Chief Complaint  Patient presents with  . Joint Swelling    Knee. Right. Pops  . Arm Injury    Cyst on left arm   UPPER RESPIRATORY TRACT INFECTION Cold symptoms x 2 days.  Worst symptom: runny nose Fever: no Cough: yes Shortness of breath: no Wheezing: no Chest pain: no Chest tightness: no Chest congestion: no Nasal congestion: no Runny nose: yes Post nasal drip: yes Sneezing: yes Sore throat: no Swollen glands: no Sinus pressure: no Headache: no Face pain: no Toothache: no Ear pain: no Ear pressure: none Eyes red/itching:no Eye drainage/crusting: no  Vomiting: no Rash: no Fatigue: no Sick contacts: no Strep contacts: no  Context: stable Recurrent sinusitis: no Relief with OTC cold/cough medications: no  Treatments attempted: anti-histamine   KNEE PAIN (RIGHT) Has been present 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  Dr. Ernest PineHooten reported she would need gel in the knee at some point per her report. Duration: chronic Involved knee: right Mechanism of injury: unknown Location:anterior Onset: gradual Severity: moderate range 5/10-7/10 Quality:  dull and aching Frequency: intermittent Radiation: no Aggravating factors: walking and movement  Alleviating factors: mobic  Status: fluctuating Treatments attempted: Mobic  Relief with NSAIDs?:  mild Weakness with weight bearing or walking: no Sensation of giving way: no Locking: no Popping: yes Bruising: no Swelling: sometimes Redness: no Paresthesias/decreased sensation: no Fevers: no   GANGLION LEFT WRIST: Reports increased size over past week, reports discomfort when lifting has started.  Would like to have area  looked at and possibly drained.  Denies pain, redness, warmth to area.  Relevant past medical, surgical, family and social history reviewed and updated as indicated. Interim medical history since our last visit reviewed. Allergies and medications reviewed and updated.  Review of Systems  Constitutional: Negative for activity change, appetite change, diaphoresis, fatigue and fever.  HENT: Positive for congestion, postnasal drip and rhinorrhea. Negative for ear discharge, ear pain, sinus pressure, sinus pain, sneezing, sore throat and voice change.   Respiratory: Positive for cough. Negative for chest tightness and shortness of breath.   Cardiovascular: Negative for chest pain, palpitations and leg swelling.  Gastrointestinal: Negative for abdominal distention, abdominal pain, constipation, diarrhea, nausea and vomiting.  Endocrine: Negative for cold intolerance, heat intolerance, polydipsia, polyphagia and polyuria.  Musculoskeletal: Positive for arthralgias.  Neurological: Negative for dizziness, syncope, weakness, light-headedness, numbness and headaches.  Psychiatric/Behavioral: Negative.     Per HPI unless specifically indicated above     Objective:    BP 115/75   Pulse 80   Temp (!) 97.4 F (36.3 C) (Oral)   Ht 5\' 3"  (1.6 m)   Wt 160 lb (72.6 kg)   LMP  (LMP Unknown)   SpO2 98%   BMI 28.34 kg/m   Wt Readings from Last 3 Encounters:  04/28/18 160 lb (72.6 kg)  04/14/18 161 lb (73 kg)  03/13/18 158 lb (71.7 kg)    Physical Exam Vitals signs and nursing note reviewed.  Constitutional:      General: She is awake.  Appearance: She is well-developed.  HENT:     Head: Normocephalic.     Right Ear: Hearing, ear canal and external ear normal. No drainage. A middle ear effusion is present.     Left Ear: Hearing, ear canal and external ear normal. No drainage. A middle ear effusion is present.     Nose: Mucosal edema and rhinorrhea present. Rhinorrhea is clear.     Right  Sinus: No maxillary sinus tenderness or frontal sinus tenderness.     Left Sinus: No maxillary sinus tenderness or frontal sinus tenderness.     Mouth/Throat:     Mouth: Mucous membranes are moist.     Pharynx: No pharyngeal swelling, oropharyngeal exudate or posterior oropharyngeal erythema.     Tonsils: Swelling: 0 on the right. 0 on the left.  Eyes:     General:        Right eye: No discharge.        Left eye: No discharge.     Conjunctiva/sclera: Conjunctivae normal.     Pupils: Pupils are equal, round, and reactive to light.  Neck:     Musculoskeletal: Normal range of motion and neck supple.     Thyroid: No thyromegaly.     Vascular: No carotid bruit or JVD.  Cardiovascular:     Rate and Rhythm: Normal rate and regular rhythm.     Heart sounds: Normal heart sounds.  Pulmonary:     Effort: Pulmonary effort is normal.     Breath sounds: Normal breath sounds.  Abdominal:     General: Bowel sounds are normal.     Palpations: Abdomen is soft.  Musculoskeletal:     Right knee: She exhibits swelling (mild). She exhibits normal range of motion, no effusion, no ecchymosis, no laceration and no erythema. Tenderness found.     Left knee: She exhibits normal range of motion, no swelling, no effusion, no ecchymosis and no laceration. No tenderness found.     Comments: Right knee with mild crepitus and popping noted on flexion/extension.  Negative valgus and varus.  Left inner wrist with small, approx 3 cm, cyst raised with no erythema or warmth.  Slightly larger than on previous exam.  Lymphadenopathy:     Cervical: No cervical adenopathy.  Skin:    General: Skin is warm and dry.  Neurological:     Mental Status: She is alert and oriented to person, place, and time.  Psychiatric:        Behavior: Behavior normal. Behavior is cooperative.        Thought Content: Thought content normal.        Judgment: Judgment normal.     Results for orders placed or performed in visit on 03/13/18   Rapid Strep Screen ([facility] ONLY)  Result Value Ref Range   Strep Gp A Ag, IA W/Reflex Negative Negative  Culture, Group A Strep  Result Value Ref Range   Strep A Culture Negative   Veritor Flu A/B Waived  Result Value Ref Range   Influenza A Negative Negative   Influenza B Negative Negative      Assessment & Plan:   Problem List Items Addressed This Visit      Musculoskeletal and Integument   Osteoarthritis - Primary    With increase in right knee pain.  Will obtain imaging and refer to ortho as patient is interested in gel placement, which was mentioned to her in past.  Recommend continue simple treatment at home and stretches.  Relevant Orders   Ambulatory referral to Orthopedics     Other   Ganglion of left wrist    Slightly larger today and reports discomfort at times.  Referral to ortho for possible draining.       Other Visit Diagnoses    Viral upper respiratory tract infection       Continue simple treatment at home as symptoms present x 2 days.  Return for worsening or continued issues.   Acute pain of right knee       Relevant Orders   DG Knee Complete 4 Views Right       Follow up plan: Return if symptoms worsen or fail to improve.

## 2018-05-02 ENCOUNTER — Ambulatory Visit
Admission: RE | Admit: 2018-05-02 | Discharge: 2018-05-02 | Disposition: A | Discharge status: Home / Self Care | Payer: Medicare Other | Source: Ambulatory Visit | Attending: Nurse Practitioner | Admitting: Nurse Practitioner

## 2018-05-02 ENCOUNTER — Other Ambulatory Visit: Payer: Self-pay

## 2018-05-02 DIAGNOSIS — Z780 Asymptomatic menopausal state: Secondary | ICD-10-CM | POA: Diagnosis not present

## 2018-05-04 ENCOUNTER — Ambulatory Visit: Payer: Self-pay | Admitting: Nurse Practitioner

## 2018-05-04 NOTE — Telephone Encounter (Signed)
  I returned pt's call.    She saw Aura Dials on 04/28/2018 and was diagnosed with URI.  Instructed to try a different antihistamine from the Claritin she has been on.  She has not tried a different antihistamine.    No other symptoms other than a runny nose.  I encouraged her to try the Allegra or Zyrtec that Jolene Cannady recommend she try from reading her office visit instructions to the pt.    She said the pharmacist showed her "a smaller pill she could take".   She is going to go back to the pharmacy and get what the pharmacist was recommending because it's a smaller pill than the Allegra.   She has taken the Claritin for today so will start the new medication recommended by the pharmacist tomorrow.  She is going to call us back on Monday and let us know how the new medication is working.   Reason for Disposition . Cold with no complications  Answer Assessment - Initial Assessment Questions 1. ONSET: "When did the nasal discharge start?"      Saw Aura Dials, NP 2. AMOUNT: "How much discharge is there?"      No cough.    My nose is running.   No other symptoms. 3. COUGH: "Do you have a cough?" If yes, ask: "Describe the color of your sputum" (clear, white, yellow, green)     No just blowing my nose. 4. RESPIRATORY DISTRESS: "Describe your breathing."      Fine no distress 5. FEVER: "Do you have a fever?" If so, ask: "What is your temperature, how was it measured, and when did it start?"     No 6. SEVERITY: "Overall, how bad are you feeling right now?" (e.g., doesn't interfere with normal activities, staying home from school/work, staying in bed)      "My nose is still running". 7. OTHER SYMPTOMS: "Do you have any other symptoms?" (e.g., sore throat, earache, wheezing, vomiting)     No 8. PREGNANCY: "Is there any chance you are pregnant?" "When was your last menstrual period?"     Not asked due to age  Protocols used: COMMON COLD-A-AH

## 2018-06-14 ENCOUNTER — Ambulatory Visit: Payer: Medicare Other | Admitting: Nurse Practitioner

## 2018-06-30 ENCOUNTER — Ambulatory Visit (INDEPENDENT_AMBULATORY_CARE_PROVIDER_SITE_OTHER): Payer: Medicare Other | Admitting: Nurse Practitioner

## 2018-06-30 ENCOUNTER — Encounter: Payer: Self-pay | Admitting: Nurse Practitioner

## 2018-06-30 ENCOUNTER — Other Ambulatory Visit: Payer: Self-pay

## 2018-06-30 VITALS — BP 127/78 | HR 74 | Temp 97.8°F | Ht 63.0 in | Wt 163.0 lb

## 2018-06-30 DIAGNOSIS — R7989 Other specified abnormal findings of blood chemistry: Secondary | ICD-10-CM

## 2018-06-30 DIAGNOSIS — R7303 Prediabetes: Secondary | ICD-10-CM | POA: Diagnosis not present

## 2018-06-30 DIAGNOSIS — I10 Essential (primary) hypertension: Secondary | ICD-10-CM

## 2018-06-30 DIAGNOSIS — E785 Hyperlipidemia, unspecified: Secondary | ICD-10-CM

## 2018-06-30 LAB — MICROALBUMIN, URINE WAIVED
Creatinine, Urine Waived: 100 mg/dL (ref 10–300)
Microalb, Ur Waived: 150 mg/L — ABNORMAL HIGH (ref 0–19)

## 2018-06-30 MED ORDER — LOSARTAN POTASSIUM 25 MG PO TABS: 25.0000 mg | ORAL_TABLET | Freq: Every day | ORAL | 3 refills | Status: DC

## 2018-06-30 NOTE — Assessment & Plan Note (Signed)
Chronic, ongoing.  Taking every other day due to myalgia and reports this has improved symptoms.  Continue current medication regimen.  Lipid panel today, is fasting, + CMP.  Return in 6 months

## 2018-06-30 NOTE — Assessment & Plan Note (Signed)
Recheck A1C.  Continue focus on diet at home.

## 2018-06-30 NOTE — Assessment & Plan Note (Signed)
Recheck TSH today.  

## 2018-06-30 NOTE — Assessment & Plan Note (Signed)
Chronic, ongoing.  Urine ALB 150 and A:C 30-300, elevated.  Will switch to Losartan 25 MG a day and discontinue Amlodipine.  Due to patient recently filling Amlodipine will start new medication in 3 months after current medication completed, she is on tight budget.  Plan to return in 6 months for follow-up and BP check with new medication.

## 2018-06-30 NOTE — Progress Notes (Signed)
BP 127/78   Pulse 74   Temp 97.8 F (36.6 C) (Oral)   Ht 5\' 3"  (1.6 m)   Wt 163 lb (73.9 kg)   LMP  (LMP Unknown)   SpO2 96%   BMI 28.87 kg/m    Subjective:    Patient ID: Carol [name], female    DOB: Oct 18, 1950, 68 y.o.   MRN: [id number]  HPI: Carol [name] is a 68 y.o. female  Chief Complaint  Patient presents with  . Hypertension    f/u  . Hyperlipidemia   HYPERTENSION / HYPERLIPIDEMIA Continues on Lipitor every other day due to myalagia for HLD and reports this has improved myalgia and Norvasc for HTN.  On urine micro noted elevation in protein in urine, discussed switching BP medication to Losartan which offers kidney protection, she agrees with this plan of care.  Just obtained a 90 day supply of Amlodipine, so will fill Losartan once this is complete.  She is aware to stop taking Amlodipine once Losartan is filled. Satisfied with current treatment? yes Duration of hypertension: chronic BP monitoring frequency: not checking BP range:  BP medication side effects: no Duration of hyperlipidemia: chronic Cholesterol medication side effects: no Cholesterol supplements: none Medication compliance: good compliance Aspirin: yes Recent stressors: no Recurrent headaches: no Visual changes: no Palpitations: no Dyspnea: no Chest pain: no Lower extremity edema: no Dizzy/lightheaded: no   ELEVATED TSH: TSH in October 6.600, attempted low dose of Levothyroxine which was not tolerated and was discontinued.  Will recheck thyroid panel today. Fatigue: no Cold intolerance: no Heat intolerance: no Weight gain: no Weight loss: no Constipation: no Diarrhea/loose stools: no Palpitations: no Lower extremity edema: no Anxiety/depressed mood: no   PREDIABETES: A1C in October was 5.8.  She is not currently on medication and is focused on diet. Polydipsia/polyuria: no Visual disturbance: no Chest pain: no Paresthesias: no  Relevant past medical, surgical, family and  social history reviewed and updated as indicated. Interim medical history since our last visit reviewed. Allergies and medications reviewed and updated.  Review of Systems  Constitutional: Negative for activity change, appetite change, diaphoresis, fatigue and fever.  Respiratory: Negative for cough, chest tightness and shortness of breath.   Cardiovascular: Negative for chest pain, palpitations and leg swelling.  Gastrointestinal: Negative for abdominal distention, abdominal pain, constipation, diarrhea, nausea and vomiting.  Endocrine: Negative for cold intolerance, heat intolerance, polydipsia, polyphagia and polyuria.  Neurological: Negative for dizziness, syncope, weakness, light-headedness, numbness and headaches.  Psychiatric/Behavioral: Negative.     Per HPI unless specifically indicated above     Objective:    BP 127/78   Pulse 74   Temp 97.8 F (36.6 C) (Oral)   Ht 5\' 3"  (1.6 m)   Wt 163 lb (73.9 kg)   LMP  (LMP Unknown)   SpO2 96%   BMI 28.87 kg/m   Wt Readings from Last 3 Encounters:  06/30/18 163 lb (73.9 kg)  04/28/18 160 lb (72.6 kg)  04/14/18 161 lb (73 kg)    Physical Exam Vitals signs and nursing note reviewed.  Constitutional:      General: She is awake.     Appearance: She is well-developed.  HENT:     Head: Normocephalic.     Right Ear: Hearing normal.     Left Ear: Hearing normal.     Nose: Nose normal.     Mouth/Throat:     Mouth: Mucous membranes are moist.  Eyes:     General: Lids are  normal.        Right eye: No discharge.        Left eye: No discharge.     Conjunctiva/sclera: Conjunctivae normal.     Pupils: Pupils are equal, round, and reactive to light.  Neck:     Musculoskeletal: Normal range of motion and neck supple.     Thyroid: No thyromegaly.     Vascular: No carotid bruit.  Cardiovascular:     Rate and Rhythm: Normal rate and regular rhythm.     Heart sounds: Normal heart sounds. No murmur. No gallop.   Pulmonary:      Effort: Pulmonary effort is normal.     Breath sounds: Normal breath sounds.  Abdominal:     General: Bowel sounds are normal.     Palpations: Abdomen is soft. There is no hepatomegaly or splenomegaly.  Musculoskeletal:     Right lower leg: No edema.     Left lower leg: No edema.  Lymphadenopathy:     Cervical: No cervical adenopathy.  Skin:    General: Skin is warm and dry.  Neurological:     Mental Status: She is alert and oriented to person, place, and time.  Psychiatric:        Attention and Perception: Attention normal.        Mood and Affect: Mood normal.        Behavior: Behavior normal. Behavior is cooperative.        Thought Content: Thought content normal.        Judgment: Judgment normal.     Results for orders placed or performed in visit on 05/01/18  Fecal occult blood, imunochemical  Result Value Ref Range   IFOBT Negative       Assessment & Plan:   Problem List Items Addressed This Visit      Cardiovascular and Mediastinum   Hypertension - Primary    Chronic, ongoing.  Urine ALB 150 and A:C 30-300, elevated.  Will switch to Losartan 25 MG a day and discontinue Amlodipine.  Due to patient recently filling Amlodipine will start new medication in 3 months after current medication completed, she is on tight budget.  Plan to return in 6 months for follow-up and BP check with new medication.        Relevant Medications   losartan (COZAAR) 25 MG tablet   Other Relevant Orders   Lipid Panel w/o Chol/HDL Ratio     Other   Hyperlipidemia    Chronic, ongoing.  Taking every other day due to myalgia and reports this has improved symptoms.  Continue current medication regimen.  Lipid panel today, is fasting, + CMP.  Return in 6 months      Relevant Medications   losartan (COZAAR) 25 MG tablet   Other Relevant Orders   Comprehensive metabolic panel   Lipid Panel w/o Chol/HDL Ratio   Elevated TSH    Recheck TSH today.      Relevant Orders   Thyroid Panel With  TSH   Microalbumin, Urine Waived   Prediabetes    Recheck A1C.  Continue focus on diet at home.      Relevant Orders   HgB A1c       Follow up plan: Return in about 6 months (around 12/31/2018) for HTN/HLD, elevated TSH, prediabetes.

## 2018-06-30 NOTE — Patient Instructions (Signed)
Losartan tablets  What is this medicine?  LOSARTAN (loe SAR tan) is used to treat high blood pressure and to reduce the risk of stroke in certain patients. This drug also slows the progression of kidney disease in patients with diabetes.  This medicine may be used for other purposes; ask your health care provider or pharmacist if you have questions.  COMMON BRAND NAME(S): Cozaar  What should I tell my health care provider before I take this medicine?  They need to know if you have any of these conditions:  -heart failure  -kidney or liver disease  -an unusual or allergic reaction to losartan, other medicines, foods, dyes, or preservatives  -pregnant or trying to get pregnant  -breast-feeding  How should I use this medicine?  Take this medicine by mouth with a glass of water. Follow the directions on the prescription label. This medicine can be taken with or without food. Take your doses at regular intervals. Do not take your medicine more often than directed.  Talk to your pediatrician regarding the use of this medicine in children. Special care may be needed.  Overdosage: If you think you have taken too much of this medicine contact a poison control center or emergency room at once.  NOTE: This medicine is only for you. Do not share this medicine with others.  What if I miss a dose?  If you miss a dose, take it as soon as you can. If it is almost time for your next dose, take only that dose. Do not take double or extra doses.  What may interact with this medicine?  -blood pressure medicines  -diuretics, especially triamterene, spironolactone, or amiloride  -fluconazole  -NSAIDs, medicines for pain and inflammation, like ibuprofen or naproxen  -potassium salts or potassium supplements  -rifampin  This list may not describe all possible interactions. Give your health care provider a list of all the medicines, herbs, non-prescription drugs, or dietary supplements you use. Also tell them if you smoke, drink alcohol, or  use illegal drugs. Some items may interact with your medicine.  What should I watch for while using this medicine?  Visit your doctor or health care professional for regular checks on your progress. Check your blood pressure as directed. Ask your doctor or health care professional what your blood pressure should be and when you should contact him or her. Call your doctor or health care professional if you notice an irregular or fast heart beat.  Women should inform their doctor if they wish to become pregnant or think they might be pregnant. There is a potential for serious side effects to an unborn child, particularly in the second or third trimester. Talk to your health care professional or pharmacist for more information.  You may get drowsy or dizzy. Do not drive, use machinery, or do anything that needs mental alertness until you know how this drug affects you. Do not stand or sit up quickly, especially if you are an older patient. This reduces the risk of dizzy or fainting spells. Alcohol can make you more drowsy and dizzy. Avoid alcoholic drinks.  Avoid salt substitutes unless you are told otherwise by your doctor or health care professional.  Do not treat yourself for coughs, colds, or pain while you are taking this medicine without asking your doctor or health care professional for advice. Some ingredients may increase your blood pressure.  What side effects may I notice from receiving this medicine?  Side effects that you should report to   your doctor or health care professional as soon as possible:  -confusion, dizziness, light headedness or fainting spells  -decreased amount of urine passed  -difficulty breathing or swallowing, hoarseness, or tightening of the throat  -fast or irregular heart beat, palpitations, or chest pain  -skin rash, itching  -swelling of your face, lips, tongue, hands, or feet  Side effects that usually do not require medical attention (report to your doctor or health care  professional if they continue or are bothersome):  -cough  -decreased sexual function or desire  -headache  -nasal congestion or stuffiness  -nausea or stomach pain  -sore or cramping muscles  This list may not describe all possible side effects. Call your doctor for medical advice about side effects. You may report side effects to FDA at 1-800-FDA-1088.  Where should I keep my medicine?  Keep out of the reach of children.  Store at room temperature between 15 and 30 degrees C (59 and 86 degrees F). Protect from light. Keep container tightly closed. Throw away any unused medicine after the expiration date.  NOTE: This sheet is a summary. It may not cover all possible information. If you have questions about this medicine, talk to your doctor, pharmacist, or health care provider.   2019 Elsevier/Gold Standard (2007-04-14 16:42:18)

## 2018-07-01 LAB — LIPID PANEL W/O CHOL/HDL RATIO
Cholesterol, Total: 157 mg/dL (ref 100–199)
HDL: 42 mg/dL (ref >39)
LDL Calculated: 97 mg/dL (ref 0–99)
Triglycerides: 92 mg/dL (ref 0–149)
VLDL Cholesterol Cal: 18 mg/dL (ref 5–40)

## 2018-07-01 LAB — HEMOGLOBIN A1C
Est. average glucose Bld gHb Est-mCnc: 126 mg/dL
Hgb A1c MFr Bld: 6 % — ABNORMAL HIGH (ref 4.8–5.6)

## 2018-07-01 LAB — THYROID PANEL WITH TSH
Free Thyroxine Index: 2 (ref 1.2–4.9)
T3 Uptake Ratio: 25 % (ref 24–39)
T4, Total: 8 ug/dL (ref 4.5–12.0)
TSH: 3.94 u[IU]/mL (ref 0.450–4.500)

## 2018-07-01 LAB — COMPREHENSIVE METABOLIC PANEL
ALT: 10 IU/L (ref 0–32)
AST: 19 IU/L (ref 0–40)
Albumin/Globulin Ratio: 1.1 — ABNORMAL LOW (ref 1.2–2.2)
Albumin: 4.1 g/dL (ref 3.8–4.8)
Alkaline Phosphatase: 145 IU/L — ABNORMAL HIGH (ref 39–117)
BUN/Creatinine Ratio: 15 (ref 12–28)
BUN: 12 mg/dL (ref 8–27)
Bilirubin Total: 0.3 mg/dL (ref 0.0–1.2)
CO2: 22 mmol/L (ref 20–29)
Calcium: 9.6 mg/dL (ref 8.7–10.3)
Chloride: 101 mmol/L (ref 96–106)
Creatinine, Ser: 0.8 mg/dL (ref 0.57–1.00)
GFR calc Af Amer: 88 mL/min/{1.73_m2} (ref >59)
GFR calc non Af Amer: 77 mL/min/{1.73_m2} (ref >59)
Globulin, Total: 3.6 g/dL (ref 1.5–4.5)
Glucose: 89 mg/dL (ref 65–99)
Potassium: 5.1 mmol/L (ref 3.5–5.2)
Sodium: 139 mmol/L (ref 134–144)
Total Protein: 7.7 g/dL (ref 6.0–8.5)

## 2018-07-03 ENCOUNTER — Telehealth: Payer: Self-pay | Admitting: Nurse Practitioner

## 2018-07-03 NOTE — Telephone Encounter (Signed)
Will call right now, thank you.

## 2018-07-03 NOTE — Telephone Encounter (Signed)
Pt called in to return call to provider. Please call her back [phone].

## 2018-07-06 ENCOUNTER — Encounter: Payer: Self-pay | Admitting: Nurse Practitioner

## 2018-09-26 ENCOUNTER — Ambulatory Visit (INDEPENDENT_AMBULATORY_CARE_PROVIDER_SITE_OTHER): Payer: Medicare Other | Admitting: Nurse Practitioner

## 2018-09-26 ENCOUNTER — Encounter: Payer: Self-pay | Admitting: Nurse Practitioner

## 2018-09-26 ENCOUNTER — Other Ambulatory Visit: Payer: Self-pay

## 2018-09-26 VITALS — BP 121/69 | HR 78 | Temp 97.8°F | Ht 63.0 in | Wt 161.0 lb

## 2018-09-26 DIAGNOSIS — I10 Essential (primary) hypertension: Secondary | ICD-10-CM

## 2018-09-26 DIAGNOSIS — R7303 Prediabetes: Secondary | ICD-10-CM

## 2018-09-26 DIAGNOSIS — E785 Hyperlipidemia, unspecified: Secondary | ICD-10-CM

## 2018-09-26 DIAGNOSIS — R809 Proteinuria, unspecified: Secondary | ICD-10-CM | POA: Diagnosis not present

## 2018-09-26 LAB — BAYER DCA HB A1C WAIVED: HB A1C (BAYER DCA - WAIVED): 5.9 % (ref <7.0)

## 2018-09-26 NOTE — Progress Notes (Signed)
BP 121/69   Pulse 78   Temp 97.8 F (36.6 C) (Oral)   Ht 5\' 3"  (1.6 m)   Wt 161 lb (73 kg)   LMP  (LMP Unknown)   SpO2 97%   BMI 28.52 kg/m    Subjective:    Patient ID: Carol [name], female    DOB: 1951/02/13, 68 y.o.   MRN: [id number]  HPI: Carol [name] is a 68 y.o. female  Chief Complaint  Patient presents with  . Follow-up  . Diabetes   PREDIABETES Last A1C 6.0% in May and patient wished to follow-up on labs with diet changes.   Polydipsia/polyuria: no Visual disturbance: no Chest pain: no Paresthesias: no Pneumovax: Up to Date Influenza: Up to Date Aspirin: yes   HYPERTENSION / HYPERLIPIDEMIA Reports some reduction in BP at home and occasional dizziness with recent change to Losartan , was previously on Amlodipine.  Have recommended taking half the tablet as BP well-controlled.  Continues On Lipitor every other day. Satisfied with current treatment? yes Duration of hypertension: chronic BP monitoring frequency: not checking BP range:  BP medication side effects: no Duration of hyperlipidemia: chronic Cholesterol medication side effects: no Cholesterol supplements: none Medication compliance: good compliance Aspirin: yes Recent stressors: no Recurrent headaches: no Visual changes: no Palpitations: no Dyspnea: no Chest pain: no Lower extremity edema: no Dizzy/lightheaded: no  Relevant past medical, surgical, family and social history reviewed and updated as indicated. Interim medical history since our last visit reviewed. Allergies and medications reviewed and updated.  Review of Systems  Constitutional: Negative for activity change, appetite change, diaphoresis, fatigue and fever.  Respiratory: Negative for cough, chest tightness and shortness of breath.   Cardiovascular: Negative for chest pain, palpitations and leg swelling.  Gastrointestinal: Negative for abdominal distention, abdominal pain, constipation, diarrhea, nausea and vomiting.   Endocrine: Negative for cold intolerance, heat intolerance, polydipsia, polyphagia and polyuria.  Neurological: Negative for dizziness, syncope, weakness, light-headedness, numbness and headaches.  Psychiatric/Behavioral: Negative.     Per HPI unless specifically indicated above     Objective:    BP 121/69   Pulse 78   Temp 97.8 F (36.6 C) (Oral)   Ht 5\' 3"  (1.6 m)   Wt 161 lb (73 kg)   LMP  (LMP Unknown)   SpO2 97%   BMI 28.52 kg/m   Wt Readings from Last 3 Encounters:  09/26/18 161 lb (73 kg)  06/30/18 163 lb (73.9 kg)  04/28/18 160 lb (72.6 kg)    Physical Exam Vitals signs and nursing note reviewed.  Constitutional:      General: She is awake. She is not in acute distress.    Appearance: She is well-developed. She is not ill-appearing.  HENT:     Head: Normocephalic.     Right Ear: Hearing normal.     Left Ear: Hearing normal.     Nose: Nose normal.     Mouth/Throat:     Mouth: Mucous membranes are moist.  Eyes:     General: Lids are normal.        Right eye: No discharge.        Left eye: No discharge.     Conjunctiva/sclera: Conjunctivae normal.     Pupils: Pupils are equal, round, and reactive to light.  Neck:     Musculoskeletal: Normal range of motion and neck supple.     Thyroid: No thyromegaly.     Vascular: No carotid bruit.  Cardiovascular:     Rate  and Rhythm: Normal rate and regular rhythm.     Heart sounds: Normal heart sounds. No murmur. No gallop.   Pulmonary:     Effort: Pulmonary effort is normal. No accessory muscle usage or respiratory distress.     Breath sounds: Normal breath sounds.  Abdominal:     General: Bowel sounds are normal.     Palpations: Abdomen is soft.  Musculoskeletal:     Right lower leg: No edema.     Left lower leg: No edema.  Lymphadenopathy:     Cervical: No cervical adenopathy.  Skin:    General: Skin is warm and dry.  Neurological:     Mental Status: She is alert and oriented to person, place, and time.   Psychiatric:        Attention and Perception: Attention normal.        Mood and Affect: Mood normal.        Behavior: Behavior normal. Behavior is cooperative.        Thought Content: Thought content normal.        Judgment: Judgment normal.     Results for orders placed or performed in visit on 06/30/18  Comprehensive metabolic panel  Result Value Ref Range   Glucose 89 65 - 99 mg/dL   BUN 12 8 - 27 mg/dL   Creatinine, Ser 1.610.80 0.57 - 1.00 mg/dL   GFR calc non Af Amer 77 >59 mL/min/1.73   GFR calc Af Amer 88 >59 mL/min/1.73   BUN/Creatinine Ratio 15 12 - 28   Sodium 139 134 - 144 mmol/L   Potassium 5.1 3.5 - 5.2 mmol/L   Chloride 101 96 - 106 mmol/L   CO2 22 20 - 29 mmol/L   Calcium 9.6 8.7 - 10.3 mg/dL   Total Protein 7.7 6.0 - 8.5 g/dL   Albumin 4.1 3.8 - 4.8 g/dL   Globulin, Total 3.6 1.5 - 4.5 g/dL   Albumin/Globulin Ratio 1.1 (L) 1.2 - 2.2   Bilirubin Total 0.3 0.0 - 1.2 mg/dL   Alkaline Phosphatase 145 (H) 39 - 117 IU/L   AST 19 0 - 40 IU/L   ALT 10 0 - 32 IU/L  Lipid Panel w/o Chol/HDL Ratio  Result Value Ref Range   Cholesterol, Total 157 100 - 199 mg/dL   Triglycerides 92 0 - 149 mg/dL   HDL 42 >09>39 mg/dL   VLDL Cholesterol Cal 18 5 - 40 mg/dL   LDL Calculated 97 0 - 99 mg/dL  Thyroid Panel With TSH  Result Value Ref Range   TSH 3.940 0.450 - 4.500 uIU/mL   T4, Total 8.0 4.5 - 12.0 ug/dL   T3 Uptake Ratio 25 24 - 39 %   Free Thyroxine Index 2.0 1.2 - 4.9  HgB A1c  Result Value Ref Range   Hgb A1c MFr Bld 6.0 (H) 4.8 - 5.6 %   Est. average glucose Bld gHb Est-mCnc 126 mg/dL  Microalbumin, Urine Waived  Result Value Ref Range   Microalb, Ur Waived 150 (H) 0 - 19 mg/L   Creatinine, Urine Waived 100 10 - 300 mg/dL   Microalb/Creat Ratio 30-300 (H) <30 mg/g      Assessment & Plan:   Problem List Items Addressed This Visit      Cardiovascular and Mediastinum   Hypertension    Chronic, ongoing on Losartan for kidney protection and reports some dizziness  with 25 MG dose.  Reduce to 12.5 MG as BP at goal today.  Recommend checking BP  at home 3 mornings a week and documenting.  Return in 6 months.        Other   Hyperlipidemia    Chronic, ongoing and doing well with every other day dosing.  Continue current medication regimen and return in 6 months for lab recheck.        Prediabetes - Primary    A1C 5.9% today, continue diet and regular exercise focus at home.      Relevant Orders   Bayer DCA Hb A1c Waived   Proteinuria    Continue Losartan for kidney protection and recheck at next visit.          Follow up plan: Return in about 6 months (around 03/29/2019) for HTN/HLD, Prediabetes.

## 2018-09-26 NOTE — Patient Instructions (Signed)
Carbohydrate Counting for Diabetes Mellitus, Adult  Carbohydrate counting is a method of keeping track of how many carbohydrates you eat. Eating carbohydrates naturally increases the amount of sugar (glucose) in the blood. Counting how many carbohydrates you eat helps keep your blood glucose within normal limits, which helps you manage your diabetes (diabetes mellitus). It is important to know how many carbohydrates you can safely have in each meal. This is different for every person. A diet and nutrition specialist (registered dietitian) can help you make a meal plan and calculate how many carbohydrates you should have at each meal and snack. Carbohydrates are found in the following foods:  Grains, such as breads and cereals.  Dried beans and soy products.  Starchy vegetables, such as potatoes, peas, and corn.  Fruit and fruit juices.  Milk and yogurt.  Sweets and snack foods, such as cake, cookies, candy, chips, and soft drinks. How do I count carbohydrates? There are two ways to count carbohydrates in food. You can use either of the methods or a combination of both. Reading "Nutrition Facts" on packaged food The "Nutrition Facts" list is included on the labels of almost all packaged foods and beverages in the [place] It includes:  The serving size.  Information about nutrients in each serving, including the grams (g) of carbohydrate per serving. To use the "Nutrition Facts":  Decide how many servings you will have.  Multiply the number of servings by the number of carbohydrates per serving.  The resulting number is the total amount of carbohydrates that you will be having. Learning standard serving sizes of other foods When you eat carbohydrate foods that are not packaged or do not include "Nutrition Facts" on the label, you need to measure the servings in order to count the amount of carbohydrates:  Measure the foods that you will eat with a food scale or measuring cup, if needed.   Decide how many standard-size servings you will eat.  Multiply the number of servings by 15. Most carbohydrate-rich foods have about 15 g of carbohydrates per serving. ? For example, if you eat 8 oz (170 g) of strawberries, you will have eaten 2 servings and 30 g of carbohydrates (2 servings x 15 g = 30 g).  For foods that have more than one food mixed, such as soups and casseroles, you must count the carbohydrates in each food that is included. The following list contains standard serving sizes of common carbohydrate-rich foods. Each of these servings has about 15 g of carbohydrates:   hamburger bun or  English muffin.   oz (15 mL) syrup.   oz (14 g) jelly.  1 slice of bread.  1 six-inch tortilla.  3 oz (85 g) cooked rice or pasta.  4 oz (113 g) cooked dried beans.  4 oz (113 g) starchy vegetable, such as peas, corn, or potatoes.  4 oz (113 g) hot cereal.  4 oz (113 g) mashed potatoes or  of a large baked potato.  4 oz (113 g) canned or frozen fruit.  4 oz (120 mL) fruit juice.  4-6 crackers.  6 chicken nuggets.  6 oz (170 g) unsweetened dry cereal.  6 oz (170 g) plain fat-free yogurt or yogurt sweetened with artificial sweeteners.  8 oz (240 mL) milk.  8 oz (170 g) fresh fruit or one small piece of fruit.  24 oz (680 g) popped popcorn. Example of carbohydrate counting Sample meal  3 oz (85 g) chicken breast.  6 oz (170 g)   brown rice.  4 oz (113 g) corn.  8 oz (240 mL) milk.  8 oz (170 g) strawberries with sugar-free whipped topping. Carbohydrate calculation 1. Identify the foods that contain carbohydrates: ? Rice. ? Corn. ? Milk. ? Strawberries. 2. Calculate how many servings you have of each food: ? 2 servings rice. ? 1 serving corn. ? 1 serving milk. ? 1 serving strawberries. 3. Multiply each number of servings by 15 g: ? 2 servings rice x 15 g = 30 g. ? 1 serving corn x 15 g = 15 g. ? 1 serving milk x 15 g = 15 g. ? 1 serving  strawberries x 15 g = 15 g. 4. Add together all of the amounts to find the total grams of carbohydrates eaten: ? 30 g + 15 g + 15 g + 15 g = 75 g of carbohydrates total. Summary  Carbohydrate counting is a method of keeping track of how many carbohydrates you eat.  Eating carbohydrates naturally increases the amount of sugar (glucose) in the blood.  Counting how many carbohydrates you eat helps keep your blood glucose within normal limits, which helps you manage your diabetes.  A diet and nutrition specialist (registered dietitian) can help you make a meal plan and calculate how many carbohydrates you should have at each meal and snack. This information is not intended to replace advice given to you by your health care provider. Make sure you discuss any questions you have with your health care provider. Document Released: 02/01/2005 Document Revised: 08/26/2016 Document Reviewed: 07/16/2015 Elsevier Patient Education  2020 [employer]  

## 2018-09-26 NOTE — Assessment & Plan Note (Signed)
A1C 5.9% today, continue diet and regular exercise focus at home.

## 2018-09-26 NOTE — Assessment & Plan Note (Signed)
Continue Losartan for kidney protection and recheck at next visit.

## 2018-09-26 NOTE — Assessment & Plan Note (Signed)
Chronic, ongoing on Losartan for kidney protection and reports some dizziness with 25 MG dose.  Reduce to 12.5 MG as BP at goal today.  Recommend checking BP at home 3 mornings a week and documenting.  Return in 6 months.

## 2018-09-26 NOTE — Assessment & Plan Note (Signed)
Chronic, ongoing and doing well with every other day dosing.  Continue current medication regimen and return in 6 months for lab recheck.

## 2018-10-19 ENCOUNTER — Telehealth: Payer: Self-pay | Admitting: Nurse Practitioner

## 2018-10-19 MED ORDER — BENZONATATE 100 MG PO CAPS: 100.0000 mg | ORAL_CAPSULE | Freq: Two times a day (BID) | ORAL | 0 refills | Status: DC | PRN

## 2018-10-19 NOTE — Telephone Encounter (Signed)
Patient notified

## 2018-10-19 NOTE — Telephone Encounter (Signed)
Will send in Tessalon for patient and recommend she return to her previous allergy medication if it worked better.  If worsening symptoms recommend she notify office immediately or go to closest urgent care or ER if office not open.   

## 2018-10-19 NOTE — Telephone Encounter (Signed)
Will send in Tessalon for patient and recommend she return to her previous allergy medication if it worked better.  If worsening symptoms recommend she notify office immediately or go to closest urgent care or ER if office not open.

## 2018-10-19 NOTE — Telephone Encounter (Signed)
Routing to provider  

## 2018-10-19 NOTE — Telephone Encounter (Signed)
Pt stated she has this cough since she switched back to her old allergy pills and she would like something called in for her cough/ please advise

## 2018-10-19 NOTE — Telephone Encounter (Signed)
Called patient, no answer, unable to leave a message, will try again.   

## 2018-10-30 ENCOUNTER — Other Ambulatory Visit: Payer: Self-pay | Admitting: Nurse Practitioner

## 2018-10-30 NOTE — Telephone Encounter (Signed)
Routing to provider  

## 2018-10-30 NOTE — Telephone Encounter (Signed)
Requested medication (s) are due for refill today: no  Requested medication (s) are on the active medication list: yes  Last refill:  10/19/2018  Future visit scheduled: yes  Notes to clinic:  Review for refill  Requested Prescriptions  Pending Prescriptions Disp Refills   benzonatate (TESSALON) 100 MG capsule [Pharmacy Med Name: BENZONATATE 100 MG CAP] 20 capsule 0    Sig: TAKE 1 CAPSULE BY MOUTH 2 TIMES DAILY ASNEEDED FOR COUGH     Ear, Nose, and Throat:  Antitussives/Expectorants Passed - 10/30/2018  2:53 PM      Passed - Valid encounter within last 12 months    Recent Outpatient Visits          1 month ago Prediabetes   [place], Barbaraann Faster, NP   4 months ago Essential hypertension   [place], [place] T, NP   6 months ago Osteoarthritis, unspecified osteoarthritis type, unspecified site   [facility], [place] T, NP   6 months ago Essential hypertension   [place], [place] T, NP   7 months ago Sore throat   [place], Barbaraann Faster, NP      Future Appointments            In 2 months Cannady, Barbaraann Faster, NP [employer], [place]   In 5 months [place], Barbaraann Faster, NP [facility]

## 2019-01-01 ENCOUNTER — Other Ambulatory Visit: Payer: Self-pay

## 2019-01-01 DIAGNOSIS — Z20822 Contact with and (suspected) exposure to covid-19: Secondary | ICD-10-CM

## 2019-01-02 ENCOUNTER — Other Ambulatory Visit: Payer: Self-pay

## 2019-01-02 ENCOUNTER — Encounter: Payer: Self-pay | Admitting: Nurse Practitioner

## 2019-01-02 ENCOUNTER — Ambulatory Visit (INDEPENDENT_AMBULATORY_CARE_PROVIDER_SITE_OTHER): Payer: Medicare Other | Admitting: Nurse Practitioner

## 2019-01-02 VITALS — BP 128/74 | HR 91 | Temp 98.1°F | Wt 158.0 lb

## 2019-01-02 DIAGNOSIS — Z23 Encounter for immunization: Secondary | ICD-10-CM | POA: Diagnosis not present

## 2019-01-02 DIAGNOSIS — E782 Mixed hyperlipidemia: Secondary | ICD-10-CM | POA: Diagnosis not present

## 2019-01-02 DIAGNOSIS — I10 Essential (primary) hypertension: Secondary | ICD-10-CM | POA: Diagnosis not present

## 2019-01-02 DIAGNOSIS — R748 Abnormal levels of other serum enzymes: Secondary | ICD-10-CM

## 2019-01-02 DIAGNOSIS — Z1239 Encounter for other screening for malignant neoplasm of breast: Secondary | ICD-10-CM

## 2019-01-02 MED ORDER — ATORVASTATIN CALCIUM 10 MG PO TABS: ORAL_TABLET | ORAL | 5 refills | Status: DC

## 2019-01-02 MED ORDER — BENZONATATE 100 MG PO CAPS: ORAL_CAPSULE | ORAL | 1 refills | Status: DC

## 2019-01-02 NOTE — Assessment & Plan Note (Signed)
Chronic, ongoing.  Continue every other day statin which has decreased side effects.  Lipid and CMP today.  Return in 6 months.

## 2019-01-02 NOTE — Progress Notes (Signed)
BP 128/74 (BP Location: Left Arm, Patient Position: Sitting)   Pulse 91   Temp 98.1 F (36.7 C) (Oral)   Wt 158 lb (71.7 kg)   LMP  (LMP Unknown)   SpO2 97%   BMI 27.99 kg/m    Subjective:    Patient ID: Carol [name], female    DOB: 01-24-1951, 68 y.o.   MRN: [id number]  HPI: CHEVY VIRGO is a 68 y.o. female  Chief Complaint  Patient presents with  . Hypertension  . Hyperlipidemia   HYPERTENSION / HYPERLIPIDEMIA Continues on Lipitor every other day due to myalagia for HLD and reports this has improved myalgia and Losartan for HTN, offers kidney protection + daily ASA.   Has not taken medicine this morning.  On CMP alk phos trends slightly elevated on review, 118 to 145.  She denies abdominal pain. Satisfied with current treatment? yes Duration of hypertension: chronic BP monitoring frequency: not checking BP range:  BP medication side effects: no Past BP meds: Amlodipine Duration of hyperlipidemia: chronic Cholesterol medication side effects: no Cholesterol supplements: none Past cholesterol medications: multiple statins Medication compliance: good compliance Aspirin: yes Recent stressors: no Recurrent headaches: no Visual changes: no Palpitations: no Dyspnea: no Chest pain: no Lower extremity edema: no Dizzy/lightheaded: no  Relevant past medical, surgical, family and social history reviewed and updated as indicated. Interim medical history since our last visit reviewed. Allergies and medications reviewed and updated.  Review of Systems  Constitutional: Negative for activity change, appetite change, diaphoresis, fatigue and fever.  Respiratory: Negative for cough, chest tightness and shortness of breath.   Cardiovascular: Negative for chest pain, palpitations and leg swelling.  Gastrointestinal: Negative for abdominal distention, abdominal pain, constipation, diarrhea, nausea and vomiting.  Endocrine: Negative for cold intolerance, heat intolerance,  polydipsia, polyphagia and polyuria.  Neurological: Negative for dizziness, syncope, weakness, light-headedness, numbness and headaches.  Psychiatric/Behavioral: Negative.     Per HPI unless specifically indicated above     Objective:    BP 128/74 (BP Location: Left Arm, Patient Position: Sitting)   Pulse 91   Temp 98.1 F (36.7 C) (Oral)   Wt 158 lb (71.7 kg)   LMP  (LMP Unknown)   SpO2 97%   BMI 27.99 kg/m   Wt Readings from Last 3 Encounters:  01/02/19 158 lb (71.7 kg)  09/26/18 161 lb (73 kg)  06/30/18 163 lb (73.9 kg)    Physical Exam Vitals signs and nursing note reviewed.  Constitutional:      General: She is awake. She is not in acute distress.    Appearance: She is well-developed. She is not ill-appearing.  HENT:     Head: Normocephalic.     Right Ear: Hearing normal.     Left Ear: Hearing normal.     Nose: Nose normal.  Eyes:     General: Lids are normal.        Right eye: No discharge.        Left eye: No discharge.     Conjunctiva/sclera: Conjunctivae normal.     Pupils: Pupils are equal, round, and reactive to light.  Neck:     Musculoskeletal: Normal range of motion and neck supple.     Thyroid: No thyromegaly.     Vascular: No carotid bruit.  Cardiovascular:     Rate and Rhythm: Normal rate and regular rhythm.     Heart sounds: Normal heart sounds. No murmur. No gallop.   Pulmonary:     Effort: Pulmonary  effort is normal. No accessory muscle usage or respiratory distress.     Breath sounds: Normal breath sounds.  Abdominal:     General: Bowel sounds are normal.     Palpations: Abdomen is soft.  Musculoskeletal:     Right lower leg: No edema.     Left lower leg: No edema.  Lymphadenopathy:     Cervical: No cervical adenopathy.  Skin:    General: Skin is warm and dry.  Neurological:     Mental Status: She is alert and oriented to person, place, and time.  Psychiatric:        Attention and Perception: Attention normal.        Mood and  Affect: Mood normal.        Behavior: Behavior normal. Behavior is cooperative.        Thought Content: Thought content normal.        Judgment: Judgment normal.    Results for orders placed or performed in visit on 09/26/18  Bayer DCA Hb A1c Waived  Result Value Ref Range   HB A1C (BAYER DCA - WAIVED) 5.9 <7.0 %      Assessment & Plan:   Problem List Items Addressed This Visit      Cardiovascular and Mediastinum   Hypertension - Primary    Chronic, stable with BP at goal on recheck today.  Has not taken medication yet this morning.  Continue Losartan for BP and proteinuria.  Check CMP today.  Return in 6 months.      Relevant Medications   atorvastatin (LIPITOR) 10 MG tablet     Other   Alkaline phosphatase elevation    CMP today.      Hyperlipidemia    Chronic, ongoing.  Continue every other day statin which has decreased side effects.  Lipid and CMP today.  Return in 6 months.      Relevant Medications   atorvastatin (LIPITOR) 10 MG tablet   Other Relevant Orders   Comprehensive metabolic panel   Lipid Panel w/o Chol/HDL Ratio out    Other Visit Diagnoses    Flu vaccine need       Relevant Orders   Flu Vaccine QUAD High Dose(Fluad) (Completed)   Encounter for screening for malignant neoplasm of breast, unspecified screening modality       Relevant Orders   MM Digital Screening       Follow up plan: Return in about 6 months (around 07/02/2019) for HTN/ HLD, Prediabetes, elevated TSH, osteoporosis.

## 2019-01-02 NOTE — Assessment & Plan Note (Signed)
CMP today

## 2019-01-02 NOTE — Patient Instructions (Addendum)
[facility]  Address: [address] [place], [place], [place] [address]  Phone: [phone]  DASH Eating Plan DASH stands for "Dietary Approaches to Stop Hypertension." The DASH eating plan is a healthy eating plan that has been shown to reduce high blood pressure (hypertension). It may also reduce your risk for type 2 diabetes, heart disease, and stroke. The DASH eating plan may also help with weight loss. What are tips for following this plan?  General guidelines  Avoid eating more than 2,300 mg (milligrams) of salt (sodium) a day. If you have hypertension, you may need to reduce your sodium intake to 1,500 mg a day.  Limit alcohol intake to no more than 1 drink a day for nonpregnant women and 2 drinks a day for men. One drink equals 12 oz of beer, 5 oz of wine, or 1 oz of hard liquor.  Work with your health care provider to maintain a healthy body weight or to lose weight. Ask what an ideal weight is for you.  Get at least 30 minutes of exercise that causes your heart to beat faster (aerobic exercise) most days of the week. Activities may include walking, swimming, or biking.  Work with your health care provider or diet and nutrition specialist (dietitian) to adjust your eating plan to your individual calorie needs. Reading food labels   Check food labels for the amount of sodium per serving. Choose foods with less than 5 percent of the Daily Value of sodium. Generally, foods with less than 300 mg of sodium per serving fit into this eating plan.  To find whole grains, look for the word "whole" as the first word in the ingredient list. Shopping  Buy products labeled as "low-sodium" or "no salt added."  Buy fresh foods. Avoid canned foods and premade or frozen meals. Cooking  Avoid adding salt when cooking. Use salt-free seasonings or herbs instead of table salt or sea salt. Check with your health care provider or pharmacist before using salt  substitutes.  Do not fry foods. Cook foods using healthy methods such as baking, boiling, grilling, and broiling instead.  Cook with heart-healthy oils, such as olive, canola, soybean, or sunflower oil. Meal planning  Eat a balanced diet that includes: ? 5 or more servings of fruits and vegetables each day. At each meal, try to fill half of your plate with fruits and vegetables. ? Up to 6-8 servings of whole grains each day. ? Less than 6 oz of lean meat, poultry, or fish each day. A 3-oz serving of meat is about the same size as a deck of cards. One egg equals 1 oz. ? 2 servings of low-fat dairy each day. ? A serving of nuts, seeds, or beans 5 times each week. ? Heart-healthy fats. Healthy fats called Omega-3 fatty acids are found in foods such as flaxseeds and coldwater fish, like sardines, salmon, and mackerel.  Limit how much you eat of the following: ? Canned or prepackaged foods. ? Food that is high in trans fat, such as fried foods. ? Food that is high in saturated fat, such as fatty meat. ? Sweets, desserts, sugary drinks, and other foods with added sugar. ? Full-fat dairy products.  Do not salt foods before eating.  Try to eat at least 2 vegetarian meals each week.  Eat more home-cooked food and less restaurant, buffet, and fast food.  When eating at a restaurant, ask that your food be prepared with less salt or no  no salt, if possible. °What foods are recommended? °The items listed may not be a complete list. Talk with your dietitian about what dietary choices are best for you. °Grains °Whole-grain or whole-wheat bread. Whole-grain or whole-wheat pasta. Brown rice. Oatmeal. Quinoa. Bulgur. Whole-grain and low-sodium cereals. Pita bread. Low-fat, low-sodium crackers. Whole-wheat flour tortillas. °Vegetables °Fresh or frozen vegetables (raw, steamed, roasted, or grilled). Low-sodium or reduced-sodium tomato and vegetable juice. Low-sodium or reduced-sodium tomato sauce and tomato  paste. Low-sodium or reduced-sodium canned vegetables. °Fruits °All fresh, dried, or frozen fruit. Canned fruit in natural juice (without added sugar). °Meat and other protein foods °Skinless chicken or turkey. Ground chicken or turkey. Pork with fat trimmed off. Fish and seafood. Egg whites. Dried beans, peas, or lentils. Unsalted nuts, nut butters, and seeds. Unsalted canned beans. Lean cuts of beef with fat trimmed off. Low-sodium, lean deli meat. °Dairy °Low-fat (1%) or fat-free (skim) milk. Fat-free, low-fat, or reduced-fat cheeses. Nonfat, low-sodium ricotta or cottage cheese. Low-fat or nonfat yogurt. Low-fat, low-sodium cheese. °Fats and oils °Soft margarine without trans fats. Vegetable oil. Low-fat, reduced-fat, or light mayonnaise and salad dressings (reduced-sodium). Canola, safflower, olive, soybean, and sunflower oils. Avocado. °Seasoning and other foods °Herbs. Spices. Seasoning mixes without salt. Unsalted popcorn and pretzels. Fat-free sweets. °What foods are not recommended? °The items listed may not be a complete list. Talk with your dietitian about what dietary choices are best for you. °Grains °Baked goods made with fat, such as croissants, muffins, or some breads. Dry pasta or rice meal packs. °Vegetables °Creamed or fried vegetables. Vegetables in a cheese sauce. Regular canned vegetables (not low-sodium or reduced-sodium). Regular canned tomato sauce and paste (not low-sodium or reduced-sodium). Regular tomato and vegetable juice (not low-sodium or reduced-sodium). Pickles. Olives. °Fruits °Canned fruit in a light or heavy syrup. Fried fruit. Fruit in cream or butter sauce. °Meat and other protein foods °Fatty cuts of meat. Ribs. Fried meat. Bacon. Sausage. Bologna and other processed lunch meats. Salami. Fatback. Hotdogs. Bratwurst. Salted nuts and seeds. Canned beans with added salt. Canned or smoked fish. Whole eggs or egg yolks. Chicken or turkey with skin. °Dairy °Whole or 2% milk,  cream, and half-and-half. Whole or full-fat cream cheese. Whole-fat or sweetened yogurt. Full-fat cheese. Nondairy creamers. Whipped toppings. Processed cheese and cheese spreads. °Fats and oils °Butter. Stick margarine. Lard. Shortening. Ghee. Bacon fat. Tropical oils, such as coconut, palm kernel, or palm oil. °Seasoning and other foods °Salted popcorn and pretzels. Onion salt, garlic salt, seasoned salt, table salt, and sea salt. Worcestershire sauce. Tartar sauce. Barbecue sauce. Teriyaki sauce. Soy sauce, including reduced-sodium. Steak sauce. Canned and packaged gravies. Fish sauce. Oyster sauce. Cocktail sauce. Horseradish that you find on the shelf. Ketchup. Mustard. Meat flavorings and tenderizers. Bouillon cubes. Hot sauce and Tabasco sauce. Premade or packaged marinades. Premade or packaged taco seasonings. Relishes. Regular salad dressings. °Where to find more information: °· National Heart, Lung, and Blood Institute: www.nhlbi.nih.gov °· American Heart Association: www.heart.org °Summary °· The DASH eating plan is a healthy eating plan that has been shown to reduce high blood pressure (hypertension). It may also reduce your risk for type 2 diabetes, heart disease, and stroke. °· With the DASH eating plan, you should limit salt (sodium) intake to 2,300 mg a day. If you have hypertension, you may need to reduce your sodium intake to 1,500 mg a day. °· When on the DASH eating plan, aim to eat more fresh fruits and vegetables, whole grains, lean proteins, low-fat dairy, and   fats.  Work with your health care provider or diet and nutrition specialist (dietitian) to adjust your eating plan to your individual calorie needs. This information is not intended to replace advice given to you by your health care provider. Make sure you discuss any questions you have with your health care provider. Document Released: 01/21/2011 Document Revised: 01/14/2017 Document Reviewed: 01/26/2016 Elsevier  Patient Education  2020 [employer].

## 2019-01-02 NOTE — Assessment & Plan Note (Signed)
Chronic, stable with BP at goal on recheck today.  Has not taken medication yet this morning.  Continue Losartan for BP and proteinuria.  Check CMP today.  Return in 6 months.

## 2019-01-03 ENCOUNTER — Encounter: Payer: Self-pay | Admitting: Nurse Practitioner

## 2019-01-03 ENCOUNTER — Telehealth: Payer: Self-pay | Admitting: Family Medicine

## 2019-01-03 LAB — COMPREHENSIVE METABOLIC PANEL
ALT: 9 IU/L (ref 0–32)
AST: 17 IU/L (ref 0–40)
Albumin/Globulin Ratio: 1.1 — ABNORMAL LOW (ref 1.2–2.2)
Albumin: 4.2 g/dL (ref 3.8–4.8)
Alkaline Phosphatase: 151 IU/L — ABNORMAL HIGH (ref 39–117)
BUN/Creatinine Ratio: 12 (ref 12–28)
BUN: 11 mg/dL (ref 8–27)
Bilirubin Total: 0.5 mg/dL (ref 0.0–1.2)
CO2: 25 mmol/L (ref 20–29)
Calcium: 9.6 mg/dL (ref 8.7–10.3)
Chloride: 99 mmol/L (ref 96–106)
Creatinine, Ser: 0.93 mg/dL (ref 0.57–1.00)
GFR calc Af Amer: 74 mL/min/{1.73_m2} (ref >59)
GFR calc non Af Amer: 64 mL/min/{1.73_m2} (ref >59)
Globulin, Total: 3.8 g/dL (ref 1.5–4.5)
Glucose: 108 mg/dL — ABNORMAL HIGH (ref 65–99)
Potassium: 3.4 mmol/L — ABNORMAL LOW (ref 3.5–5.2)
Sodium: 139 mmol/L (ref 134–144)
Total Protein: 8 g/dL (ref 6.0–8.5)

## 2019-01-03 LAB — LIPID PANEL W/O CHOL/HDL RATIO
Cholesterol, Total: 133 mg/dL (ref 100–199)
HDL: 40 mg/dL (ref >39)
LDL Chol Calc (NIH): 77 mg/dL (ref 0–99)
Triglycerides: 80 mg/dL (ref 0–149)
VLDL Cholesterol Cal: 16 mg/dL (ref 5–40)

## 2019-01-03 LAB — NOVEL CORONAVIRUS, NAA: SARS-CoV-2, NAA: DETECTED — AB

## 2019-01-03 NOTE — Telephone Encounter (Signed)
Please let her know that she does have COVID. Make sure that she is staying home and staying away from people for 14 days- she can go out again on 11/30. Anyone living with her should also be self-quarantining and should be tested if they have any symptoms.  If she is having increased symptoms, she should go to the ER. Thanks.

## 2019-01-03 NOTE — Telephone Encounter (Signed)
She cannot pick that up. What pharmacy does she use- we will need to see if we can get someone to deliver that to her

## 2019-01-03 NOTE — Telephone Encounter (Signed)
Patient notified

## 2019-01-03 NOTE — Telephone Encounter (Signed)
Patient states that she has some medication that she needs to pick up from the drug store and that it is a drive through, asked patient if she had anyone to pick it up for her and she said no. Please advise.

## 2019-01-03 NOTE — Telephone Encounter (Signed)
Patient notified, she states that she will get someone to pick it up.

## 2019-01-17 ENCOUNTER — Other Ambulatory Visit: Payer: Self-pay

## 2019-01-17 DIAGNOSIS — Z20822 Contact with and (suspected) exposure to covid-19: Secondary | ICD-10-CM

## 2019-01-20 LAB — NOVEL CORONAVIRUS, NAA: SARS-CoV-2, NAA: NOT DETECTED

## 2019-02-14 ENCOUNTER — Ambulatory Visit
Admission: RE | Admit: 2019-02-14 | Discharge: 2019-02-14 | Disposition: A | Discharge status: Home / Self Care | Payer: Medicare Other | Source: Ambulatory Visit | Attending: Nurse Practitioner | Admitting: Nurse Practitioner

## 2019-02-14 DIAGNOSIS — Z1231 Encounter for screening mammogram for malignant neoplasm of breast: Secondary | ICD-10-CM | POA: Diagnosis not present

## 2019-02-14 DIAGNOSIS — Z1239 Encounter for other screening for malignant neoplasm of breast: Secondary | ICD-10-CM

## 2019-02-14 NOTE — Progress Notes (Signed)
Please alert her mammogram was normal and can repeat in one year.

## 2019-03-23 ENCOUNTER — Other Ambulatory Visit: Payer: Self-pay | Admitting: Unknown Physician Specialty

## 2019-03-23 NOTE — Telephone Encounter (Signed)
Requested Prescriptions  Pending Prescriptions Disp Refills  . meloxicam (MOBIC) 15 MG tablet [Pharmacy Med Name: MELOXICAM 15 MG TAB] 90 tablet 2    Sig: TAKE 1 TABLET BY MOUTH DAILY     Analgesics:  COX2 Inhibitors Failed - 03/23/2019  9:00 AM      Failed - HGB in normal range and within 360 days    Hemoglobin  Date Value Ref Range Status  12/14/2017 12.6 11.1 - 15.9 g/dL Final         Passed - Cr in normal range and within 360 days    Creatinine  Date Value Ref Range Status  12/20/2013 0.72 0.60 - 1.30 mg/dL Final   Creatinine, Ser  Date Value Ref Range Status  01/02/2019 0.93 0.57 - 1.00 mg/dL Final         Passed - Patient is not pregnant      Passed - Valid encounter within last 12 months    Recent Outpatient Visits          2 months ago Essential hypertension   [facility] [place], Dorie Rank, NP   5 months ago Prediabetes   [facility] [place], [place] T, NP   8 months ago Essential hypertension   [facility] [place], [place] T, NP   10 months ago Osteoarthritis, unspecified osteoarthritis type, unspecified site   [facility], [place] T, NP   11 months ago Essential hypertension   [facility] [place], Dorie Rank, NP      Future Appointments            In 1 week [place], Dorie Rank, NP [facility]   In 3 months [place], Dorie Rank, NP [facility]

## 2019-03-27 ENCOUNTER — Other Ambulatory Visit: Payer: Self-pay

## 2019-03-27 NOTE — Patient Outreach (Signed)
[facility]) Care Management  03/27/2019  Carol [name] 1950/12/01 [id number]   Medication Adherence call to [facility] spoke with patient she is due on Atorvastatin 10 mg,patient explain she took her last pill last night,patient prefers to order it her self. [name] is showing due under [facility] Ins.   Lillia Abed CPhT Pharmacy Technician [facility] Management Direct Dial [phone]  Fax [phone] [email or web address]

## 2019-04-03 ENCOUNTER — Other Ambulatory Visit: Payer: Self-pay

## 2019-04-03 ENCOUNTER — Ambulatory Visit (INDEPENDENT_AMBULATORY_CARE_PROVIDER_SITE_OTHER): Payer: Medicare Other | Admitting: Nurse Practitioner

## 2019-04-03 ENCOUNTER — Encounter: Payer: Self-pay | Admitting: Nurse Practitioner

## 2019-04-03 VITALS — BP 142/84 | HR 84 | Temp 97.8°F | Ht 63.0 in | Wt 158.0 lb

## 2019-04-03 DIAGNOSIS — I10 Essential (primary) hypertension: Secondary | ICD-10-CM | POA: Diagnosis not present

## 2019-04-03 DIAGNOSIS — R7309 Other abnormal glucose: Secondary | ICD-10-CM

## 2019-04-03 NOTE — Patient Instructions (Signed)
DASH Eating Plan DASH stands for "Dietary Approaches to Stop Hypertension." The DASH eating plan is a healthy eating plan that has been shown to reduce high blood pressure (hypertension). It may also reduce your risk for type 2 diabetes, heart disease, and stroke. The DASH eating plan may also help with weight loss. What are tips for following this plan?  General guidelines  Avoid eating more than 2,300 mg (milligrams) of salt (sodium) a day. If you have hypertension, you may need to reduce your sodium intake to 1,500 mg a day.  Limit alcohol intake to no more than 1 drink a day for nonpregnant women and 2 drinks a day for men. One drink equals 12 oz of beer, 5 oz of wine, or 1 oz of hard liquor.  Work with your health care provider to maintain a healthy body weight or to lose weight. Ask what an ideal weight is for you.  Get at least 30 minutes of exercise that causes your heart to beat faster (aerobic exercise) most days of the week. Activities may include walking, swimming, or biking.  Work with your health care provider or diet and nutrition specialist (dietitian) to adjust your eating plan to your individual calorie needs. Reading food labels   Check food labels for the amount of sodium per serving. Choose foods with less than 5 percent of the Daily Value of sodium. Generally, foods with less than 300 mg of sodium per serving fit into this eating plan.  To find whole grains, look for the word "whole" as the first word in the ingredient list. Shopping  Buy products labeled as "low-sodium" or "no salt added."  Buy fresh foods. Avoid canned foods and premade or frozen meals. Cooking  Avoid adding salt when cooking. Use salt-free seasonings or herbs instead of table salt or sea salt. Check with your health care provider or pharmacist before using salt substitutes.  Do not fry foods. Cook foods using healthy methods such as baking, boiling, grilling, and broiling instead.  Cook with  heart-healthy oils, such as olive, canola, soybean, or sunflower oil. Meal planning  Eat a balanced diet that includes: ? 5 or more servings of fruits and vegetables each day. At each meal, try to fill half of your plate with fruits and vegetables. ? Up to 6-8 servings of whole grains each day. ? Less than 6 oz of lean meat, poultry, or fish each day. A 3-oz serving of meat is about the same size as a deck of cards. One egg equals 1 oz. ? 2 servings of low-fat dairy each day. ? A serving of nuts, seeds, or beans 5 times each week. ? Heart-healthy fats. Healthy fats called Omega-3 fatty acids are found in foods such as flaxseeds and coldwater fish, like sardines, salmon, and mackerel.  Limit how much you eat of the following: ? Canned or prepackaged foods. ? Food that is high in trans fat, such as fried foods. ? Food that is high in saturated fat, such as fatty meat. ? Sweets, desserts, sugary drinks, and other foods with added sugar. ? Full-fat dairy products.  Do not salt foods before eating.  Try to eat at least 2 vegetarian meals each week.  Eat more home-cooked food and less restaurant, buffet, and fast food.  When eating at a restaurant, ask that your food be prepared with less salt or no salt, if possible. What foods are recommended? The items listed may not be a complete list. Talk with your dietitian about   what dietary choices are best for you. Grains Whole-grain or whole-wheat bread. Whole-grain or whole-wheat pasta. Brown rice. Oatmeal. Quinoa. Bulgur. Whole-grain and low-sodium cereals. Pita bread. Low-fat, low-sodium crackers. Whole-wheat flour tortillas. Vegetables Fresh or frozen vegetables (raw, steamed, roasted, or grilled). Low-sodium or reduced-sodium tomato and vegetable juice. Low-sodium or reduced-sodium tomato sauce and tomato paste. Low-sodium or reduced-sodium canned vegetables. Fruits All fresh, dried, or frozen fruit. Canned fruit in natural juice (without  added sugar). Meat and other protein foods Skinless chicken or turkey. Ground chicken or turkey. Pork with fat trimmed off. Fish and seafood. Egg whites. Dried beans, peas, or lentils. Unsalted nuts, nut butters, and seeds. Unsalted canned beans. Lean cuts of beef with fat trimmed off. Low-sodium, lean deli meat. Dairy Low-fat (1%) or fat-free (skim) milk. Fat-free, low-fat, or reduced-fat cheeses. Nonfat, low-sodium ricotta or cottage cheese. Low-fat or nonfat yogurt. Low-fat, low-sodium cheese. Fats and oils Soft margarine without trans fats. Vegetable oil. Low-fat, reduced-fat, or light mayonnaise and salad dressings (reduced-sodium). Canola, safflower, olive, soybean, and sunflower oils. Avocado. Seasoning and other foods Herbs. Spices. Seasoning mixes without salt. Unsalted popcorn and pretzels. Fat-free sweets. What foods are not recommended? The items listed may not be a complete list. Talk with your dietitian about what dietary choices are best for you. Grains Baked goods made with fat, such as croissants, muffins, or some breads. Dry pasta or rice meal packs. Vegetables Creamed or fried vegetables. Vegetables in a cheese sauce. Regular canned vegetables (not low-sodium or reduced-sodium). Regular canned tomato sauce and paste (not low-sodium or reduced-sodium). Regular tomato and vegetable juice (not low-sodium or reduced-sodium). Pickles. Olives. Fruits Canned fruit in a light or heavy syrup. Fried fruit. Fruit in cream or butter sauce. Meat and other protein foods Fatty cuts of meat. Ribs. Fried meat. Bacon. Sausage. Bologna and other processed lunch meats. Salami. Fatback. Hotdogs. Bratwurst. Salted nuts and seeds. Canned beans with added salt. Canned or smoked fish. Whole eggs or egg yolks. Chicken or turkey with skin. Dairy Whole or 2% milk, cream, and half-and-half. Whole or full-fat cream cheese. Whole-fat or sweetened yogurt. Full-fat cheese. Nondairy creamers. Whipped toppings.  Processed cheese and cheese spreads. Fats and oils Butter. Stick margarine. Lard. Shortening. Ghee. Bacon fat. Tropical oils, such as coconut, palm kernel, or palm oil. Seasoning and other foods Salted popcorn and pretzels. Onion salt, garlic salt, seasoned salt, table salt, and sea salt. Worcestershire sauce. Tartar sauce. Barbecue sauce. Teriyaki sauce. Soy sauce, including reduced-sodium. Steak sauce. Canned and packaged gravies. Fish sauce. Oyster sauce. Cocktail sauce. Horseradish that you find on the shelf. Ketchup. Mustard. Meat flavorings and tenderizers. Bouillon cubes. Hot sauce and Tabasco sauce. Premade or packaged marinades. Premade or packaged taco seasonings. Relishes. Regular salad dressings. Where to find more information:  National Heart, Lung, and Blood Institute: www.nhlbi.nih.gov  American Heart Association: www.heart.org Summary  The DASH eating plan is a healthy eating plan that has been shown to reduce high blood pressure (hypertension). It may also reduce your risk for type 2 diabetes, heart disease, and stroke.  With the DASH eating plan, you should limit salt (sodium) intake to 2,300 mg a day. If you have hypertension, you may need to reduce your sodium intake to 1,500 mg a day.  When on the DASH eating plan, aim to eat more fresh fruits and vegetables, whole grains, lean proteins, low-fat dairy, and heart-healthy fats.  Work with your health care provider or diet and nutrition specialist (dietitian) to adjust your eating plan to your   individual calorie needs. This information is not intended to replace advice given to you by your health care provider. Make sure you discuss any questions you have with your health care provider. Document Revised: 01/14/2017 Document Reviewed: 01/26/2016 Elsevier Patient Education  2020 Else[employer] Inc.  

## 2019-04-03 NOTE — Assessment & Plan Note (Signed)
Chronic, ongoing with BP above goal today on initial and recheck.  Has not taken medication yet this morning.  Increase Losartan to full tablet (25 MG) for BP and proteinuria, has had increased stressors lately with husband being sick.  Check CMP next visit.  Return in 4 weeks.

## 2019-04-03 NOTE — Assessment & Plan Note (Signed)
Recheck A1C at next visit in 4 weeks and change plan of care as needed.

## 2019-04-03 NOTE — Progress Notes (Signed)
BP (!) 142/84 (BP Location: Left Arm)   Pulse 84 Comment: apical  Temp 97.8 F (36.6 C) (Oral)   Ht 5\' 3"  (1.6 m)   Wt 158 lb (71.7 kg)   LMP  (LMP Unknown)   SpO2 97%   BMI 27.99 kg/m    Subjective:    Patient ID: Carol [name], female    DOB: 1950-06-19, 69 y.o.   MRN: [id number]  HPI: Carol [name] is a 69 y.o. female  Chief Complaint  Patient presents with  . Hypertension  . Hyperlipidemia   HYPERTENSION / HYPERLIPIDEMIA Continues on Losartan 25 MG daily (she is taking 1/2 a pill), Atorvastatin 10 MG daily, ASA.   Satisfied with current treatment? yes Duration of hypertension: chronic BP monitoring frequency: not checking BP range:  BP medication side effects: no Duration of hyperlipidemia: chronic Cholesterol medication side effects: no Cholesterol supplements: none Medication compliance: good compliance Aspirin: yes Recent stressors: no Recurrent headaches: no Visual changes: no Palpitations: no Dyspnea: no Chest pain: no Lower extremity edema: no Dizzy/lightheaded: no   PREDIABETES Last A1C in August 2020 == 5.9% Polydipsia/polyuria: no Visual disturbance: no Chest pain: no Paresthesias: no  Relevant past medical, surgical, family and social history reviewed and updated as indicated. Interim medical history since our last visit reviewed. Allergies and medications reviewed and updated.  Review of Systems  Constitutional: Negative for activity change, appetite change, diaphoresis, fatigue and fever.  Respiratory: Negative for cough, chest tightness and shortness of breath.   Cardiovascular: Negative for chest pain, palpitations and leg swelling.  Gastrointestinal: Negative.   Endocrine: Negative for cold intolerance, heat intolerance, polydipsia, polyphagia and polyuria.  Neurological: Negative.   Psychiatric/Behavioral: Negative.     Per HPI unless specifically indicated above     Objective:    BP (!) 142/84 (BP Location: Left Arm)    Pulse 84 Comment: apical  Temp 97.8 F (36.6 C) (Oral)   Ht 5\' 3"  (1.6 m)   Wt 158 lb (71.7 kg)   LMP  (LMP Unknown)   SpO2 97%   BMI 27.99 kg/m   Wt Readings from Last 3 Encounters:  04/03/19 158 lb (71.7 kg)  01/02/19 158 lb (71.7 kg)  09/26/18 161 lb (73 kg)    Physical Exam Vitals and nursing note reviewed.  Constitutional:      General: She is awake. She is not in acute distress.    Appearance: She is well-developed. She is not ill-appearing.  HENT:     Head: Normocephalic.     Right Ear: Hearing normal.     Left Ear: Hearing normal.     Nose: Nose normal.  Eyes:     General: Lids are normal.        Right eye: No discharge.        Left eye: No discharge.     Conjunctiva/sclera: Conjunctivae normal.     Pupils: Pupils are equal, round, and reactive to light.  Neck:     Thyroid: No thyromegaly.     Vascular: No carotid bruit.  Cardiovascular:     Rate and Rhythm: Normal rate and regular rhythm.     Heart sounds: Normal heart sounds. No murmur. No gallop.   Pulmonary:     Effort: Pulmonary effort is normal. No accessory muscle usage or respiratory distress.     Breath sounds: Normal breath sounds.  Abdominal:     General: Bowel sounds are normal.     Palpations: Abdomen is soft.  Musculoskeletal:  Cervical back: Normal range of motion and neck supple.     Right lower leg: No edema.     Left lower leg: No edema.  Lymphadenopathy:     Cervical: No cervical adenopathy.  Skin:    General: Skin is warm and dry.  Neurological:     Mental Status: She is alert and oriented to person, place, and time.  Psychiatric:        Attention and Perception: Attention normal.        Mood and Affect: Mood normal.        Behavior: Behavior normal. Behavior is cooperative.        Thought Content: Thought content normal.        Judgment: Judgment normal.     Results for orders placed or performed in visit on 01/17/19  Novel Coronavirus, NAA ([facility])   Specimen:  Nasopharyngeal(NP) swabs in vial transport medium   NASOPHARYNGE  TESTING  Result Value Ref Range   SARS-CoV-2, NAA Not Detected Not Detected      Assessment & Plan:   Problem List Items Addressed This Visit      Cardiovascular and Mediastinum   Hypertension - Primary    Chronic, ongoing with BP above goal today on initial and recheck.  Has not taken medication yet this morning.  Increase Losartan to full tablet (25 MG) for BP and proteinuria, has had increased stressors lately with husband being sick.  Check CMP next visit.  Return in 4 weeks.        Other   Elevated hemoglobin A1c    Recheck A1C at next visit in 4 weeks and change plan of care as needed.          Follow up plan: Return in about 4 weeks (around 05/01/2019) for HTN and Prediabetes with labs.

## 2019-04-10 LAB — FECAL OCCULT BLOOD, IMMUNOCHEMICAL: IFOBT: NEGATIVE

## 2019-04-17 ENCOUNTER — Other Ambulatory Visit: Payer: Self-pay

## 2019-04-17 ENCOUNTER — Ambulatory Visit (INDEPENDENT_AMBULATORY_CARE_PROVIDER_SITE_OTHER): Payer: Medicare Other | Admitting: Nurse Practitioner

## 2019-04-17 ENCOUNTER — Encounter: Payer: Self-pay | Admitting: Nurse Practitioner

## 2019-04-17 VITALS — BP 148/90 | HR 88 | Temp 97.7°F

## 2019-04-17 DIAGNOSIS — E782 Mixed hyperlipidemia: Secondary | ICD-10-CM

## 2019-04-17 DIAGNOSIS — R7309 Other abnormal glucose: Secondary | ICD-10-CM

## 2019-04-17 DIAGNOSIS — I10 Essential (primary) hypertension: Secondary | ICD-10-CM

## 2019-04-17 MED ORDER — LOSARTAN POTASSIUM 50 MG PO TABS: 50.0000 mg | ORAL_TABLET | Freq: Every day | ORAL | 3 refills | Status: DC

## 2019-04-17 NOTE — Patient Instructions (Signed)
DASH Eating Plan DASH stands for "Dietary Approaches to Stop Hypertension." The DASH eating plan is a healthy eating plan that has been shown to reduce high blood pressure (hypertension). It may also reduce your risk for type 2 diabetes, heart disease, and stroke. The DASH eating plan may also help with weight loss. What are tips for following this plan?  General guidelines  Avoid eating more than 2,300 mg (milligrams) of salt (sodium) a day. If you have hypertension, you may need to reduce your sodium intake to 1,500 mg a day.  Limit alcohol intake to no more than 1 drink a day for nonpregnant women and 2 drinks a day for men. One drink equals 12 oz of beer, 5 oz of wine, or 1 oz of hard liquor.  Work with your health care provider to maintain a healthy body weight or to lose weight. Ask what an ideal weight is for you.  Get at least 30 minutes of exercise that causes your heart to beat faster (aerobic exercise) most days of the week. Activities may include walking, swimming, or biking.  Work with your health care provider or diet and nutrition specialist (dietitian) to adjust your eating plan to your individual calorie needs. Reading food labels   Check food labels for the amount of sodium per serving. Choose foods with less than 5 percent of the Daily Value of sodium. Generally, foods with less than 300 mg of sodium per serving fit into this eating plan.  To find whole grains, look for the word "whole" as the first word in the ingredient list. Shopping  Buy products labeled as "low-sodium" or "no salt added."  Buy fresh foods. Avoid canned foods and premade or frozen meals. Cooking  Avoid adding salt when cooking. Use salt-free seasonings or herbs instead of table salt or sea salt. Check with your health care provider or pharmacist before using salt substitutes.  Do not fry foods. Cook foods using healthy methods such as baking, boiling, grilling, and broiling instead.  Cook with  heart-healthy oils, such as olive, canola, soybean, or sunflower oil. Meal planning  Eat a balanced diet that includes: ? 5 or more servings of fruits and vegetables each day. At each meal, try to fill half of your plate with fruits and vegetables. ? Up to 6-8 servings of whole grains each day. ? Less than 6 oz of lean meat, poultry, or fish each day. A 3-oz serving of meat is about the same size as a deck of cards. One egg equals 1 oz. ? 2 servings of low-fat dairy each day. ? A serving of nuts, seeds, or beans 5 times each week. ? Heart-healthy fats. Healthy fats called Omega-3 fatty acids are found in foods such as flaxseeds and coldwater fish, like sardines, salmon, and mackerel.  Limit how much you eat of the following: ? Canned or prepackaged foods. ? Food that is high in trans fat, such as fried foods. ? Food that is high in saturated fat, such as fatty meat. ? Sweets, desserts, sugary drinks, and other foods with added sugar. ? Full-fat dairy products.  Do not salt foods before eating.  Try to eat at least 2 vegetarian meals each week.  Eat more home-cooked food and less restaurant, buffet, and fast food.  When eating at a restaurant, ask that your food be prepared with less salt or no salt, if possible. What foods are recommended? The items listed may not be a complete list. Talk with your dietitian about   what dietary choices are best for you. Grains Whole-grain or whole-wheat bread. Whole-grain or whole-wheat pasta. Brown rice. Oatmeal. Quinoa. Bulgur. Whole-grain and low-sodium cereals. Pita bread. Low-fat, low-sodium crackers. Whole-wheat flour tortillas. Vegetables Fresh or frozen vegetables (raw, steamed, roasted, or grilled). Low-sodium or reduced-sodium tomato and vegetable juice. Low-sodium or reduced-sodium tomato sauce and tomato paste. Low-sodium or reduced-sodium canned vegetables. Fruits All fresh, dried, or frozen fruit. Canned fruit in natural juice (without  added sugar). Meat and other protein foods Skinless chicken or turkey. Ground chicken or turkey. Pork with fat trimmed off. Fish and seafood. Egg whites. Dried beans, peas, or lentils. Unsalted nuts, nut butters, and seeds. Unsalted canned beans. Lean cuts of beef with fat trimmed off. Low-sodium, lean deli meat. Dairy Low-fat (1%) or fat-free (skim) milk. Fat-free, low-fat, or reduced-fat cheeses. Nonfat, low-sodium ricotta or cottage cheese. Low-fat or nonfat yogurt. Low-fat, low-sodium cheese. Fats and oils Soft margarine without trans fats. Vegetable oil. Low-fat, reduced-fat, or light mayonnaise and salad dressings (reduced-sodium). Canola, safflower, olive, soybean, and sunflower oils. Avocado. Seasoning and other foods Herbs. Spices. Seasoning mixes without salt. Unsalted popcorn and pretzels. Fat-free sweets. What foods are not recommended? The items listed may not be a complete list. Talk with your dietitian about what dietary choices are best for you. Grains Baked goods made with fat, such as croissants, muffins, or some breads. Dry pasta or rice meal packs. Vegetables Creamed or fried vegetables. Vegetables in a cheese sauce. Regular canned vegetables (not low-sodium or reduced-sodium). Regular canned tomato sauce and paste (not low-sodium or reduced-sodium). Regular tomato and vegetable juice (not low-sodium or reduced-sodium). Pickles. Olives. Fruits Canned fruit in a light or heavy syrup. Fried fruit. Fruit in cream or butter sauce. Meat and other protein foods Fatty cuts of meat. Ribs. Fried meat. Bacon. Sausage. Bologna and other processed lunch meats. Salami. Fatback. Hotdogs. Bratwurst. Salted nuts and seeds. Canned beans with added salt. Canned or smoked fish. Whole eggs or egg yolks. Chicken or turkey with skin. Dairy Whole or 2% milk, cream, and half-and-half. Whole or full-fat cream cheese. Whole-fat or sweetened yogurt. Full-fat cheese. Nondairy creamers. Whipped toppings.  Processed cheese and cheese spreads. Fats and oils Butter. Stick margarine. Lard. Shortening. Ghee. Bacon fat. Tropical oils, such as coconut, palm kernel, or palm oil. Seasoning and other foods Salted popcorn and pretzels. Onion salt, garlic salt, seasoned salt, table salt, and sea salt. Worcestershire sauce. Tartar sauce. Barbecue sauce. Teriyaki sauce. Soy sauce, including reduced-sodium. Steak sauce. Canned and packaged gravies. Fish sauce. Oyster sauce. Cocktail sauce. Horseradish that you find on the shelf. Ketchup. Mustard. Meat flavorings and tenderizers. Bouillon cubes. Hot sauce and Tabasco sauce. Premade or packaged marinades. Premade or packaged taco seasonings. Relishes. Regular salad dressings. Where to find more information:  National Heart, Lung, and Blood Institute: www.nhlbi.nih.gov  American Heart Association: www.heart.org Summary  The DASH eating plan is a healthy eating plan that has been shown to reduce high blood pressure (hypertension). It may also reduce your risk for type 2 diabetes, heart disease, and stroke.  With the DASH eating plan, you should limit salt (sodium) intake to 2,300 mg a day. If you have hypertension, you may need to reduce your sodium intake to 1,500 mg a day.  When on the DASH eating plan, aim to eat more fresh fruits and vegetables, whole grains, lean proteins, low-fat dairy, and heart-healthy fats.  Work with your health care provider or diet and nutrition specialist (dietitian) to adjust your eating plan to your   individual calorie needs. This information is not intended to replace advice given to you by your health care provider. Make sure you discuss any questions you have with your health care provider. Document Revised: 01/14/2017 Document Reviewed: 01/26/2016 Elsevier Patient Education  2020 Else[employer] Inc.  

## 2019-04-17 NOTE — Assessment & Plan Note (Signed)
Recheck A1C today and change plan of care as needed. 

## 2019-04-17 NOTE — Assessment & Plan Note (Signed)
Chronic, ongoing.  Continues every other day statin which has decreased side effects.  Lipid panel today. Adjust dose as needed. 

## 2019-04-17 NOTE — Progress Notes (Signed)
BP (!) 148/90 (BP Location: Left Arm, Patient Position: Sitting)   Pulse 88 Comment: apical  Temp 97.7 F (36.5 C) (Oral)   LMP  (LMP Unknown)   SpO2 98%    Subjective:    Patient ID: Carol [name], female    DOB: [birth year], 69 y.o.   MRN: [id number]  HPI: Carol [name] is a 69 y.o. female  Chief Complaint  Patient presents with  . Hypertension   HYPERTENSION Continues on Losartan 25 MG daily and Atorvastatin 10 MG every other day.  Has a lot of stressors in life right now with concerns her husband's health and him possibly having CA.  Does endorse this is affecting her stress level and BP. Hypertension status: stable  Satisfied with current treatment? yes Duration of hypertension: chronic BP monitoring frequency:  rarely BP range: checked one time and it was high 180 SBP range BP medication side effects:  no Medication compliance: good compliance Aspirin: yes Recurrent headaches: no Visual changes: no Palpitations: no Dyspnea: no Chest pain: no Lower extremity edema: no Dizzy/lightheaded: no  The 10-year ASCVD risk score Mikey Bussing DC Jr., et al., 2013) is: 10%   Values used to calculate the score:     Age: 11 years     Sex: Female     Is Non-Hispanic African American: Yes     Diabetic: No     Tobacco smoker: No     Systolic Blood Pressure: 916 mmHg     Is BP treated: Yes     HDL Cholesterol: 40 mg/dL     Total Cholesterol: 133 mg/dL   PREDIABETES Recent A1C readings May 6.0% and August 5.9%. Polydipsia/polyuria: no Visual disturbance: no Chest pain: no Paresthesias: no  Relevant past medical, surgical, family and social history reviewed and updated as indicated. Interim medical history since our last visit reviewed. Allergies and medications reviewed and updated.  Review of Systems  Constitutional: Negative for activity change, appetite change, diaphoresis, fatigue and fever.  Respiratory: Negative for cough, chest tightness and shortness of breath.     Cardiovascular: Negative for chest pain, palpitations and leg swelling.  Gastrointestinal: Negative.   Endocrine: Negative for polydipsia, polyphagia and polyuria.  Neurological: Negative.   Psychiatric/Behavioral: Negative.     Per HPI unless specifically indicated above     Objective:    BP (!) 148/90 (BP Location: Left Arm, Patient Position: Sitting)   Pulse 88 Comment: apical  Temp 97.7 F (36.5 C) (Oral)   LMP  (LMP Unknown)   SpO2 98%   Wt Readings from Last 3 Encounters:  04/03/19 158 lb (71.7 kg)  01/02/19 158 lb (71.7 kg)  09/26/18 161 lb (73 kg)    Physical Exam Vitals and nursing note reviewed.  Constitutional:      General: She is awake. She is not in acute distress.    Appearance: She is well-developed, well-groomed and overweight. She is not ill-appearing.  HENT:     Head: Normocephalic.     Right Ear: Hearing normal.     Left Ear: Hearing normal.     Nose: Nose normal.  Eyes:     General: Lids are normal.        Right eye: No discharge.        Left eye: No discharge.     Conjunctiva/sclera: Conjunctivae normal.     Pupils: Pupils are equal, round, and reactive to light.  Neck:     Thyroid: No thyromegaly.     Vascular: No  carotid bruit.  Cardiovascular:     Rate and Rhythm: Normal rate and regular rhythm.     Heart sounds: Normal heart sounds. No murmur. No gallop.   Pulmonary:     Effort: Pulmonary effort is normal. No accessory muscle usage or respiratory distress.     Breath sounds: Normal breath sounds.  Abdominal:     General: Bowel sounds are normal.     Palpations: Abdomen is soft.  Musculoskeletal:     Cervical back: Normal range of motion and neck supple.     Right lower leg: No edema.     Left lower leg: No edema.  Lymphadenopathy:     Cervical: No cervical adenopathy.  Skin:    General: Skin is warm and dry.  Neurological:     Mental Status: She is alert and oriented to person, place, and time.  Psychiatric:        Attention  and Perception: Attention normal.        Mood and Affect: Mood normal.        Speech: Speech normal.        Behavior: Behavior normal. Behavior is cooperative.        Thought Content: Thought content normal.     Results for orders placed or performed in visit on 01/17/19  Novel Coronavirus, NAA ([facility])   Specimen: Nasopharyngeal(NP) swabs in vial transport medium   NASOPHARYNGE  TESTING  Result Value Ref Range   SARS-CoV-2, NAA Not Detected Not Detected      Assessment & Plan:   Problem List Items Addressed This Visit      Cardiovascular and Mediastinum   Hypertension - Primary    Chronic, ongoing with BP above goal today on initial and recheck.  Increase Losartan to 50 MG for BP and proteinuria, has had increased stressors lately with husband being sick.  Check BMP today.  Recommend she check BP at home at least a few times a week + focus on DASH diet.  Return in 4 weeks.      Relevant Medications   losartan (COZAAR) 50 MG tablet   Other Relevant Orders   Basic Metabolic Panel (BMET)     Other   Hyperlipidemia    Chronic, ongoing.  Continues every other day statin which has decreased side effects.  Lipid panel today. Adjust dose as needed.      Relevant Medications   losartan (COZAAR) 50 MG tablet   Other Relevant Orders   Lipid Panel w/o Chol/HDL Ratio   Elevated hemoglobin A1c    Recheck A1C today and change plan of care as needed.          Follow up plan: Return in about 4 weeks (around 05/15/2019) for HTN.

## 2019-04-17 NOTE — Assessment & Plan Note (Addendum)
Chronic, ongoing with BP above goal today on initial and recheck.  Increase Losartan to 50 MG for BP and proteinuria, has had increased stressors lately with husband being sick.  Check BMP today.  Recommend she check BP at home at least a few times a week + focus on DASH diet.  Return in 4 weeks.

## 2019-04-18 ENCOUNTER — Telehealth: Payer: Self-pay

## 2019-04-18 LAB — BASIC METABOLIC PANEL
BUN/Creatinine Ratio: 13 (ref 12–28)
BUN: 10 mg/dL (ref 8–27)
CO2: 22 mmol/L (ref 20–29)
Calcium: 9.6 mg/dL (ref 8.7–10.3)
Chloride: 103 mmol/L (ref 96–106)
Creatinine, Ser: 0.77 mg/dL (ref 0.57–1.00)
GFR calc Af Amer: 92 mL/min/{1.73_m2} (ref >59)
GFR calc non Af Amer: 80 mL/min/{1.73_m2} (ref >59)
Glucose: 92 mg/dL (ref 65–99)
Potassium: 3.9 mmol/L (ref 3.5–5.2)
Sodium: 144 mmol/L (ref 134–144)

## 2019-04-18 LAB — LIPID PANEL W/O CHOL/HDL RATIO
Cholesterol, Total: 167 mg/dL (ref 100–199)
HDL: 49 mg/dL (ref >39)
LDL Chol Calc (NIH): 96 mg/dL (ref 0–99)
Triglycerides: 125 mg/dL (ref 0–149)
VLDL Cholesterol Cal: 22 mg/dL (ref 5–40)

## 2019-04-18 NOTE — Addendum Note (Signed)
Addended by: Aura Dials T on: 04/18/2019 08:03 AM   Modules accepted: Orders

## 2019-04-18 NOTE — Addendum Note (Signed)
Addended by: Nils Pyle R on: 04/18/2019 02:10 PM   Modules accepted: Orders

## 2019-04-18 NOTE — Telephone Encounter (Signed)
Alert her to take return to 25 MG daily then and will recheck BP next visit.

## 2019-04-18 NOTE — Telephone Encounter (Signed)
Patient notified. Wanted to schedule f/up, coming back in 04/24/19 for a f/up

## 2019-04-18 NOTE — Telephone Encounter (Signed)
Copied from CRM [id number]. Topic: General - Inquiry >> Apr 18, 2019  2:59 PM Deborha Payment wrote: Reason for CRM: Patient is requesting a call back from PCP, Patient states it is about her BP medication.  Patient did not really want to speak with me couldn't get a lot of info.  She is just requesting PCP to call back Call back [phone]   Called and spoke to patient. She states that the 50 mg Losartan has made her feel very strange today. She states that she feels like she is walking on clouds and very swimmy headed. Thinks the 50 mg is to strong.

## 2019-04-18 NOTE — Progress Notes (Signed)
Please let Malayla know we can take one stress off her table.  Her kidney function looks good.  Lipid panel shows good ranges.  Waiting for A1C for her prediabetes to return, will alert her of result once available.  Have a great day!!  Big hugs!!

## 2019-04-19 LAB — HEMOGLOBIN A1C
Est. average glucose Bld gHb Est-mCnc: 123 mg/dL
Hgb A1c MFr Bld: 5.9 % — ABNORMAL HIGH (ref 4.8–5.6)

## 2019-04-19 NOTE — Progress Notes (Signed)
Please let Carol [name] know her A1C, diabetes testing, is remaining stable in prediabetes range with no worsening.  This is good, continue diet focus.

## 2019-04-24 ENCOUNTER — Ambulatory Visit (INDEPENDENT_AMBULATORY_CARE_PROVIDER_SITE_OTHER): Payer: Medicare Other | Admitting: Nurse Practitioner

## 2019-04-24 ENCOUNTER — Other Ambulatory Visit: Payer: Self-pay

## 2019-04-24 ENCOUNTER — Encounter: Payer: Self-pay | Admitting: Nurse Practitioner

## 2019-04-24 DIAGNOSIS — I10 Essential (primary) hypertension: Secondary | ICD-10-CM | POA: Diagnosis not present

## 2019-04-24 NOTE — Assessment & Plan Note (Signed)
Chronic, ongoing with BP initially elevated today and repeat at goal.  Continue Losartan 25 MG for BP and proteinuria, has had increased stressors lately with husband being sick.  Recommend she check BP at home at least a few times a week + focus on DASH diet.  Return in 2 weeks per patient request to check BP and talk with provider, stressors.

## 2019-04-24 NOTE — Progress Notes (Signed)
BP 128/82 (BP Location: Left Arm, Patient Position: Sitting, Cuff Size: Normal) Comment: manual  Pulse 86 Comment: apical  Temp 97.8 F (36.6 C) (Oral)   LMP  (LMP Unknown)   SpO2 99%    Subjective:    Patient ID: Carol [name], female    DOB: [birth year], 69 y.o.   MRN: [id number]  HPI: Carol [name] is a 69 y.o. female  Chief Complaint  Patient presents with  . Hypertension   HYPERTENSION  Tried increasing last visit her Losartan to 50 MG, but she did not tolerate this, started having dizzy spells.  Taking 25 MG Losartan at this time.  At this time has increased stress with her husband being sick.   Hypertension status: stable  Satisfied with current treatment? yes Duration of hypertension: chronic BP monitoring frequency:  not checking BP range:  BP medication side effects:  no Medication compliance: good compliance Aspirin: yes Recurrent headaches: no Visual changes: no Palpitations: no Dyspnea: no Chest pain: no Lower extremity edema: no Dizzy/lightheaded: no  Relevant past medical, surgical, family and social history reviewed and updated as indicated. Interim medical history since our last visit reviewed. Allergies and medications reviewed and updated.  Review of Systems  Constitutional: Negative for activity change, appetite change, diaphoresis, fatigue and fever.  Respiratory: Negative for cough, chest tightness and shortness of breath.   Cardiovascular: Negative for chest pain, palpitations and leg swelling.  Gastrointestinal: Negative.   Endocrine: Negative for polydipsia, polyphagia and polyuria.  Neurological: Negative.   Psychiatric/Behavioral: Negative.     Per HPI unless specifically indicated above     Objective:    BP 128/82 (BP Location: Left Arm, Patient Position: Sitting, Cuff Size: Normal) Comment: manual  Pulse 86 Comment: apical  Temp 97.8 F (36.6 C) (Oral)   LMP  (LMP Unknown)   SpO2 99%   Wt Readings from Last 3 Encounters:    04/03/19 158 lb (71.7 kg)  01/02/19 158 lb (71.7 kg)  09/26/18 161 lb (73 kg)    Physical Exam Vitals and nursing note reviewed.  Constitutional:      General: She is awake. She is not in acute distress.    Appearance: She is well-developed, well-groomed and overweight. She is not ill-appearing.  HENT:     Head: Normocephalic.     Right Ear: Hearing normal.     Left Ear: Hearing normal.     Nose: Nose normal.  Eyes:     General: Lids are normal.        Right eye: No discharge.        Left eye: No discharge.     Conjunctiva/sclera: Conjunctivae normal.     Pupils: Pupils are equal, round, and reactive to light.  Neck:     Thyroid: No thyromegaly.     Vascular: No carotid bruit.  Cardiovascular:     Rate and Rhythm: Normal rate and regular rhythm.     Heart sounds: Normal heart sounds. No murmur. No gallop.   Pulmonary:     Effort: Pulmonary effort is normal. No accessory muscle usage or respiratory distress.     Breath sounds: Normal breath sounds.  Abdominal:     General: Bowel sounds are normal.     Palpations: Abdomen is soft.  Musculoskeletal:     Cervical back: Normal range of motion and neck supple.     Right lower leg: No edema.     Left lower leg: No edema.  Lymphadenopathy:  Cervical: No cervical adenopathy.  Skin:    General: Skin is warm and dry.  Neurological:     Mental Status: She is alert and oriented to person, place, and time.  Psychiatric:        Attention and Perception: Attention normal.        Mood and Affect: Mood normal.        Speech: Speech normal.        Behavior: Behavior normal. Behavior is cooperative.        Thought Content: Thought content normal.     Results for orders placed or performed in visit on 14/97/02  Basic Metabolic Panel (BMET)  Result Value Ref Range   Glucose 92 65 - 99 mg/dL   BUN 10 8 - 27 mg/dL   Creatinine, Ser 0.77 0.57 - 1.00 mg/dL   GFR calc non Af Amer 80 >59 mL/min/1.73   GFR calc Af Amer 92 >59  mL/min/1.73   BUN/Creatinine Ratio 13 12 - 28   Sodium 144 134 - 144 mmol/L   Potassium 3.9 3.5 - 5.2 mmol/L   Chloride 103 96 - 106 mmol/L   CO2 22 20 - 29 mmol/L   Calcium 9.6 8.7 - 10.3 mg/dL  Lipid Panel w/o Chol/HDL Ratio  Result Value Ref Range   Cholesterol, Total 167 100 - 199 mg/dL   Triglycerides 125 0 - 149 mg/dL   HDL 49 >39 mg/dL   VLDL Cholesterol Cal 22 5 - 40 mg/dL   LDL Chol Calc (NIH) 96 0 - 99 mg/dL  HgB A1c  Result Value Ref Range   Hgb A1c MFr Bld 5.9 (H) 4.8 - 5.6 %   Est. average glucose Bld gHb Est-mCnc 123 mg/dL      Assessment & Plan:   Problem List Items Addressed This Visit      Cardiovascular and Mediastinum   Hypertension    Chronic, ongoing with BP initially elevated today and repeat at goal.  Continue Losartan 25 MG for BP and proteinuria, has had increased stressors lately with husband being sick.  Recommend she check BP at home at least a few times a week + focus on DASH diet.  Return in 2 weeks per patient request to check BP and talk with provider, stressors.          Follow up plan: Return in about 2 weeks (around 05/08/2019) for HTN.

## 2019-04-24 NOTE — Patient Instructions (Signed)
DASH Eating Plan DASH stands for "Dietary Approaches to Stop Hypertension." The DASH eating plan is a healthy eating plan that has been shown to reduce high blood pressure (hypertension). It may also reduce your risk for type 2 diabetes, heart disease, and stroke. The DASH eating plan may also help with weight loss. What are tips for following this plan?  General guidelines  Avoid eating more than 2,300 mg (milligrams) of salt (sodium) a day. If you have hypertension, you may need to reduce your sodium intake to 1,500 mg a day.  Limit alcohol intake to no more than 1 drink a day for nonpregnant women and 2 drinks a day for men. One drink equals 12 oz of beer, 5 oz of wine, or 1 oz of hard liquor.  Work with your health care provider to maintain a healthy body weight or to lose weight. Ask what an ideal weight is for you.  Get at least 30 minutes of exercise that causes your heart to beat faster (aerobic exercise) most days of the week. Activities may include walking, swimming, or biking.  Work with your health care provider or diet and nutrition specialist (dietitian) to adjust your eating plan to your individual calorie needs. Reading food labels   Check food labels for the amount of sodium per serving. Choose foods with less than 5 percent of the Daily Value of sodium. Generally, foods with less than 300 mg of sodium per serving fit into this eating plan.  To find whole grains, look for the word "whole" as the first word in the ingredient list. Shopping  Buy products labeled as "low-sodium" or "no salt added."  Buy fresh foods. Avoid canned foods and premade or frozen meals. Cooking  Avoid adding salt when cooking. Use salt-free seasonings or herbs instead of table salt or sea salt. Check with your health care provider or pharmacist before using salt substitutes.  Do not fry foods. Cook foods using healthy methods such as baking, boiling, grilling, and broiling instead.  Cook with  heart-healthy oils, such as olive, canola, soybean, or sunflower oil. Meal planning  Eat a balanced diet that includes: ? 5 or more servings of fruits and vegetables each day. At each meal, try to fill half of your plate with fruits and vegetables. ? Up to 6-8 servings of whole grains each day. ? Less than 6 oz of lean meat, poultry, or fish each day. A 3-oz serving of meat is about the same size as a deck of cards. One egg equals 1 oz. ? 2 servings of low-fat dairy each day. ? A serving of nuts, seeds, or beans 5 times each week. ? Heart-healthy fats. Healthy fats called Omega-3 fatty acids are found in foods such as flaxseeds and coldwater fish, like sardines, salmon, and mackerel.  Limit how much you eat of the following: ? Canned or prepackaged foods. ? Food that is high in trans fat, such as fried foods. ? Food that is high in saturated fat, such as fatty meat. ? Sweets, desserts, sugary drinks, and other foods with added sugar. ? Full-fat dairy products.  Do not salt foods before eating.  Try to eat at least 2 vegetarian meals each week.  Eat more home-cooked food and less restaurant, buffet, and fast food.  When eating at a restaurant, ask that your food be prepared with less salt or no salt, if possible. What foods are recommended? The items listed may not be a complete list. Talk with your dietitian about   what dietary choices are best for you. Grains Whole-grain or whole-wheat bread. Whole-grain or whole-wheat pasta. Brown rice. Oatmeal. Quinoa. Bulgur. Whole-grain and low-sodium cereals. Pita bread. Low-fat, low-sodium crackers. Whole-wheat flour tortillas. Vegetables Fresh or frozen vegetables (raw, steamed, roasted, or grilled). Low-sodium or reduced-sodium tomato and vegetable juice. Low-sodium or reduced-sodium tomato sauce and tomato paste. Low-sodium or reduced-sodium canned vegetables. Fruits All fresh, dried, or frozen fruit. Canned fruit in natural juice (without  added sugar). Meat and other protein foods Skinless chicken or turkey. Ground chicken or turkey. Pork with fat trimmed off. Fish and seafood. Egg whites. Dried beans, peas, or lentils. Unsalted nuts, nut butters, and seeds. Unsalted canned beans. Lean cuts of beef with fat trimmed off. Low-sodium, lean deli meat. Dairy Low-fat (1%) or fat-free (skim) milk. Fat-free, low-fat, or reduced-fat cheeses. Nonfat, low-sodium ricotta or cottage cheese. Low-fat or nonfat yogurt. Low-fat, low-sodium cheese. Fats and oils Soft margarine without trans fats. Vegetable oil. Low-fat, reduced-fat, or light mayonnaise and salad dressings (reduced-sodium). Canola, safflower, olive, soybean, and sunflower oils. Avocado. Seasoning and other foods Herbs. Spices. Seasoning mixes without salt. Unsalted popcorn and pretzels. Fat-free sweets. What foods are not recommended? The items listed may not be a complete list. Talk with your dietitian about what dietary choices are best for you. Grains Baked goods made with fat, such as croissants, muffins, or some breads. Dry pasta or rice meal packs. Vegetables Creamed or fried vegetables. Vegetables in a cheese sauce. Regular canned vegetables (not low-sodium or reduced-sodium). Regular canned tomato sauce and paste (not low-sodium or reduced-sodium). Regular tomato and vegetable juice (not low-sodium or reduced-sodium). Pickles. Olives. Fruits Canned fruit in a light or heavy syrup. Fried fruit. Fruit in cream or butter sauce. Meat and other protein foods Fatty cuts of meat. Ribs. Fried meat. Bacon. Sausage. Bologna and other processed lunch meats. Salami. Fatback. Hotdogs. Bratwurst. Salted nuts and seeds. Canned beans with added salt. Canned or smoked fish. Whole eggs or egg yolks. Chicken or turkey with skin. Dairy Whole or 2% milk, cream, and half-and-half. Whole or full-fat cream cheese. Whole-fat or sweetened yogurt. Full-fat cheese. Nondairy creamers. Whipped toppings.  Processed cheese and cheese spreads. Fats and oils Butter. Stick margarine. Lard. Shortening. Ghee. Bacon fat. Tropical oils, such as coconut, palm kernel, or palm oil. Seasoning and other foods Salted popcorn and pretzels. Onion salt, garlic salt, seasoned salt, table salt, and sea salt. Worcestershire sauce. Tartar sauce. Barbecue sauce. Teriyaki sauce. Soy sauce, including reduced-sodium. Steak sauce. Canned and packaged gravies. Fish sauce. Oyster sauce. Cocktail sauce. Horseradish that you find on the shelf. Ketchup. Mustard. Meat flavorings and tenderizers. Bouillon cubes. Hot sauce and Tabasco sauce. Premade or packaged marinades. Premade or packaged taco seasonings. Relishes. Regular salad dressings. Where to find more information:  National Heart, Lung, and Blood Institute: www.nhlbi.nih.gov  American Heart Association: www.heart.org Summary  The DASH eating plan is a healthy eating plan that has been shown to reduce high blood pressure (hypertension). It may also reduce your risk for type 2 diabetes, heart disease, and stroke.  With the DASH eating plan, you should limit salt (sodium) intake to 2,300 mg a day. If you have hypertension, you may need to reduce your sodium intake to 1,500 mg a day.  When on the DASH eating plan, aim to eat more fresh fruits and vegetables, whole grains, lean proteins, low-fat dairy, and heart-healthy fats.  Work with your health care provider or diet and nutrition specialist (dietitian) to adjust your eating plan to your   individual calorie needs. This information is not intended to replace advice given to you by your health care provider. Make sure you discuss any questions you have with your health care provider. Document Revised: 01/14/2017 Document Reviewed: 01/26/2016 Elsevier Patient Education  2020 Else[employer] Inc.  

## 2019-05-01 ENCOUNTER — Ambulatory Visit: Payer: Medicare Other | Admitting: Nurse Practitioner

## 2019-05-08 ENCOUNTER — Other Ambulatory Visit: Payer: Self-pay

## 2019-05-08 ENCOUNTER — Ambulatory Visit (INDEPENDENT_AMBULATORY_CARE_PROVIDER_SITE_OTHER): Payer: Medicare Other | Admitting: Nurse Practitioner

## 2019-05-08 ENCOUNTER — Encounter: Payer: Self-pay | Admitting: Nurse Practitioner

## 2019-05-08 DIAGNOSIS — I10 Essential (primary) hypertension: Secondary | ICD-10-CM | POA: Diagnosis not present

## 2019-05-08 NOTE — Patient Instructions (Signed)
DASH Eating Plan DASH stands for "Dietary Approaches to Stop Hypertension." The DASH eating plan is a healthy eating plan that has been shown to reduce high blood pressure (hypertension). It may also reduce your risk for type 2 diabetes, heart disease, and stroke. The DASH eating plan may also help with weight loss. What are tips for following this plan?  General guidelines  Avoid eating more than 2,300 mg (milligrams) of salt (sodium) a day. If you have hypertension, you may need to reduce your sodium intake to 1,500 mg a day.  Limit alcohol intake to no more than 1 drink a day for nonpregnant women and 2 drinks a day for men. One drink equals 12 oz of beer, 5 oz of wine, or 1 oz of hard liquor.  Work with your health care provider to maintain a healthy body weight or to lose weight. Ask what an ideal weight is for you.  Get at least 30 minutes of exercise that causes your heart to beat faster (aerobic exercise) most days of the week. Activities may include walking, swimming, or biking.  Work with your health care provider or diet and nutrition specialist (dietitian) to adjust your eating plan to your individual calorie needs. Reading food labels   Check food labels for the amount of sodium per serving. Choose foods with less than 5 percent of the Daily Value of sodium. Generally, foods with less than 300 mg of sodium per serving fit into this eating plan.  To find whole grains, look for the word "whole" as the first word in the ingredient list. Shopping  Buy products labeled as "low-sodium" or "no salt added."  Buy fresh foods. Avoid canned foods and premade or frozen meals. Cooking  Avoid adding salt when cooking. Use salt-free seasonings or herbs instead of table salt or sea salt. Check with your health care provider or pharmacist before using salt substitutes.  Do not fry foods. Cook foods using healthy methods such as baking, boiling, grilling, and broiling instead.  Cook with  heart-healthy oils, such as olive, canola, soybean, or sunflower oil. Meal planning  Eat a balanced diet that includes: ? 5 or more servings of fruits and vegetables each day. At each meal, try to fill half of your plate with fruits and vegetables. ? Up to 6-8 servings of whole grains each day. ? Less than 6 oz of lean meat, poultry, or fish each day. A 3-oz serving of meat is about the same size as a deck of cards. One egg equals 1 oz. ? 2 servings of low-fat dairy each day. ? A serving of nuts, seeds, or beans 5 times each week. ? Heart-healthy fats. Healthy fats called Omega-3 fatty acids are found in foods such as flaxseeds and coldwater fish, like sardines, salmon, and mackerel.  Limit how much you eat of the following: ? Canned or prepackaged foods. ? Food that is high in trans fat, such as fried foods. ? Food that is high in saturated fat, such as fatty meat. ? Sweets, desserts, sugary drinks, and other foods with added sugar. ? Full-fat dairy products.  Do not salt foods before eating.  Try to eat at least 2 vegetarian meals each week.  Eat more home-cooked food and less restaurant, buffet, and fast food.  When eating at a restaurant, ask that your food be prepared with less salt or no salt, if possible. What foods are recommended? The items listed may not be a complete list. Talk with your dietitian about   what dietary choices are best for you. Grains Whole-grain or whole-wheat bread. Whole-grain or whole-wheat pasta. Brown rice. Oatmeal. Quinoa. Bulgur. Whole-grain and low-sodium cereals. Pita bread. Low-fat, low-sodium crackers. Whole-wheat flour tortillas. Vegetables Fresh or frozen vegetables (raw, steamed, roasted, or grilled). Low-sodium or reduced-sodium tomato and vegetable juice. Low-sodium or reduced-sodium tomato sauce and tomato paste. Low-sodium or reduced-sodium canned vegetables. Fruits All fresh, dried, or frozen fruit. Canned fruit in natural juice (without  added sugar). Meat and other protein foods Skinless chicken or turkey. Ground chicken or turkey. Pork with fat trimmed off. Fish and seafood. Egg whites. Dried beans, peas, or lentils. Unsalted nuts, nut butters, and seeds. Unsalted canned beans. Lean cuts of beef with fat trimmed off. Low-sodium, lean deli meat. Dairy Low-fat (1%) or fat-free (skim) milk. Fat-free, low-fat, or reduced-fat cheeses. Nonfat, low-sodium ricotta or cottage cheese. Low-fat or nonfat yogurt. Low-fat, low-sodium cheese. Fats and oils Soft margarine without trans fats. Vegetable oil. Low-fat, reduced-fat, or light mayonnaise and salad dressings (reduced-sodium). Canola, safflower, olive, soybean, and sunflower oils. Avocado. Seasoning and other foods Herbs. Spices. Seasoning mixes without salt. Unsalted popcorn and pretzels. Fat-free sweets. What foods are not recommended? The items listed may not be a complete list. Talk with your dietitian about what dietary choices are best for you. Grains Baked goods made with fat, such as croissants, muffins, or some breads. Dry pasta or rice meal packs. Vegetables Creamed or fried vegetables. Vegetables in a cheese sauce. Regular canned vegetables (not low-sodium or reduced-sodium). Regular canned tomato sauce and paste (not low-sodium or reduced-sodium). Regular tomato and vegetable juice (not low-sodium or reduced-sodium). Pickles. Olives. Fruits Canned fruit in a light or heavy syrup. Fried fruit. Fruit in cream or butter sauce. Meat and other protein foods Fatty cuts of meat. Ribs. Fried meat. Bacon. Sausage. Bologna and other processed lunch meats. Salami. Fatback. Hotdogs. Bratwurst. Salted nuts and seeds. Canned beans with added salt. Canned or smoked fish. Whole eggs or egg yolks. Chicken or turkey with skin. Dairy Whole or 2% milk, cream, and half-and-half. Whole or full-fat cream cheese. Whole-fat or sweetened yogurt. Full-fat cheese. Nondairy creamers. Whipped toppings.  Processed cheese and cheese spreads. Fats and oils Butter. Stick margarine. Lard. Shortening. Ghee. Bacon fat. Tropical oils, such as coconut, palm kernel, or palm oil. Seasoning and other foods Salted popcorn and pretzels. Onion salt, garlic salt, seasoned salt, table salt, and sea salt. Worcestershire sauce. Tartar sauce. Barbecue sauce. Teriyaki sauce. Soy sauce, including reduced-sodium. Steak sauce. Canned and packaged gravies. Fish sauce. Oyster sauce. Cocktail sauce. Horseradish that you find on the shelf. Ketchup. Mustard. Meat flavorings and tenderizers. Bouillon cubes. Hot sauce and Tabasco sauce. Premade or packaged marinades. Premade or packaged taco seasonings. Relishes. Regular salad dressings. Where to find more information:  National Heart, Lung, and Blood Institute: www.nhlbi.nih.gov  American Heart Association: www.heart.org Summary  The DASH eating plan is a healthy eating plan that has been shown to reduce high blood pressure (hypertension). It may also reduce your risk for type 2 diabetes, heart disease, and stroke.  With the DASH eating plan, you should limit salt (sodium) intake to 2,300 mg a day. If you have hypertension, you may need to reduce your sodium intake to 1,500 mg a day.  When on the DASH eating plan, aim to eat more fresh fruits and vegetables, whole grains, lean proteins, low-fat dairy, and heart-healthy fats.  Work with your health care provider or diet and nutrition specialist (dietitian) to adjust your eating plan to your   individual calorie needs. This information is not intended to replace advice given to you by your health care provider. Make sure you discuss any questions you have with your health care provider. Document Revised: 01/14/2017 Document Reviewed: 01/26/2016 Elsevier Patient Education  2020 Else[employer] Inc.  

## 2019-05-08 NOTE — Assessment & Plan Note (Signed)
Chronic, ongoing with BP below goal today.  Continue Losartan 25 MG for BP and proteinuria, has had increased stressors lately with husband being sick.  Recommend she check BP at home at least a few times a week + focus on DASH diet.  Return in 4 weeks per patient request to check BP and talk with provider, stressors.

## 2019-05-08 NOTE — Progress Notes (Signed)
BP 118/72 (BP Location: Left Arm, Patient Position: Sitting)   Pulse 70   Temp 97.7 F (36.5 C) (Oral)   Wt 158 lb (71.7 kg)   LMP  (LMP Unknown)   SpO2 99%   BMI 27.99 kg/m    Subjective:    Patient ID: Carol [name], female    DOB: [birth year], 69 y.o.   MRN: [id number]  HPI: Carol [name] is a 69 y.o. female  Chief Complaint  Patient presents with  . Hypertension   HYPERTENSION  Tried increasing recently Losartan to 50 MG, but she did not tolerate this, started having dizzy spells.  Taking 25 MG Losartan at this time.  At this time has increased stress with her husband being sick.   Hypertension status: stable  Satisfied with current treatment? yes Duration of hypertension: chronic BP monitoring frequency:  not checking BP range:  BP medication side effects:  no Medication compliance: good compliance Aspirin: yes Recurrent headaches: no Visual changes: no Palpitations: no Dyspnea: no Chest pain: no Lower extremity edema: no Dizzy/lightheaded: no  Relevant past medical, surgical, family and social history reviewed and updated as indicated. Interim medical history since our last visit reviewed. Allergies and medications reviewed and updated.  Review of Systems  Constitutional: Negative for activity change, appetite change, diaphoresis, fatigue and fever.  Respiratory: Negative for cough, chest tightness and shortness of breath.   Cardiovascular: Negative for chest pain, palpitations and leg swelling.  Gastrointestinal: Negative.   Endocrine: Negative for polydipsia, polyphagia and polyuria.  Neurological: Negative.   Psychiatric/Behavioral: Negative.     Per HPI unless specifically indicated above     Objective:    BP 118/72 (BP Location: Left Arm, Patient Position: Sitting)   Pulse 70   Temp 97.7 F (36.5 C) (Oral)   Wt 158 lb (71.7 kg)   LMP  (LMP Unknown)   SpO2 99%   BMI 27.99 kg/m   Wt Readings from Last 3 Encounters:  05/08/19 158 lb (71.7  kg)  04/03/19 158 lb (71.7 kg)  01/02/19 158 lb (71.7 kg)    Physical Exam Vitals and nursing note reviewed.  Constitutional:      General: She is awake. She is not in acute distress.    Appearance: She is well-developed, well-groomed and overweight. She is not ill-appearing.  HENT:     Head: Normocephalic.     Right Ear: Hearing normal.     Left Ear: Hearing normal.     Nose: Nose normal.  Eyes:     General: Lids are normal.        Right eye: No discharge.        Left eye: No discharge.     Conjunctiva/sclera: Conjunctivae normal.     Pupils: Pupils are equal, round, and reactive to light.  Neck:     Thyroid: No thyromegaly.     Vascular: No carotid bruit.  Cardiovascular:     Rate and Rhythm: Normal rate and regular rhythm.     Heart sounds: Normal heart sounds. No murmur. No gallop.   Pulmonary:     Effort: Pulmonary effort is normal. No accessory muscle usage or respiratory distress.     Breath sounds: Normal breath sounds.  Abdominal:     General: Bowel sounds are normal.     Palpations: Abdomen is soft.  Musculoskeletal:     Cervical back: Normal range of motion and neck supple.     Right lower leg: No edema.  Left lower leg: No edema.  Lymphadenopathy:     Cervical: No cervical adenopathy.  Skin:    General: Skin is warm and dry.  Neurological:     Mental Status: She is alert and oriented to person, place, and time.  Psychiatric:        Attention and Perception: Attention normal.        Mood and Affect: Mood normal.        Speech: Speech normal.        Behavior: Behavior normal. Behavior is cooperative.        Thought Content: Thought content normal.     Results for orders placed or performed in visit on 05/07/19  Fecal occult blood, imunochemical  Result Value Ref Range   IFOBT Negative       Assessment & Plan:   Problem List Items Addressed This Visit      Cardiovascular and Mediastinum   Hypertension    Chronic, ongoing with BP below goal  today.  Continue Losartan 25 MG for BP and proteinuria, has had increased stressors lately with husband being sick.  Recommend she check BP at home at least a few times a week + focus on DASH diet.  Return in 4 weeks per patient request to check BP and talk with provider, stressors.          Follow up plan: Return in about 4 weeks (around 06/05/2019) for HTN &HLD.

## 2019-05-14 ENCOUNTER — Telehealth: Payer: Self-pay

## 2019-05-14 NOTE — Telephone Encounter (Signed)
Spoke to patient, she wanted to know if she could go to half her Losartan pill as it is making her feel dizzy sometimes and she is drinking a lot of fluid after taking it.  States she is not checking BP at home, but does not like feeling dizzy after taking it.  Discussed, and she will try Losartan 25 MG and recommend she check BP at home with this change.  Keeps scheduled follow-up and call for worsening symptoms.

## 2019-05-14 NOTE — Telephone Encounter (Signed)
Copied from CRM [id number]. Topic: General - Other >> May 14, 2019 10:30 AM Gwenlyn Fudge wrote: Reason for CRM: Pt called and is requesting to get a call back from PCP regarding her BP. Pt is requesting to speak with PCP directly and not an Geophysicist/field seismologist. Please advise.   Routing to provider at patient request.

## 2019-05-31 ENCOUNTER — Other Ambulatory Visit: Payer: Self-pay

## 2019-05-31 ENCOUNTER — Ambulatory Visit (INDEPENDENT_AMBULATORY_CARE_PROVIDER_SITE_OTHER): Payer: Medicare Other | Admitting: Nurse Practitioner

## 2019-05-31 ENCOUNTER — Encounter: Payer: Self-pay | Admitting: Nurse Practitioner

## 2019-05-31 VITALS — BP 178/80 | HR 87 | Temp 97.5°F

## 2019-05-31 DIAGNOSIS — M67432 Ganglion, left wrist: Secondary | ICD-10-CM

## 2019-05-31 DIAGNOSIS — I10 Essential (primary) hypertension: Secondary | ICD-10-CM | POA: Diagnosis not present

## 2019-05-31 NOTE — Patient Instructions (Signed)
Ganglion Cyst  A ganglion cyst is a non-cancerous, fluid-filled lump that occurs near a joint or tendon. The cyst grows out of a joint or the lining of a tendon. Ganglion cysts most often develop in the hand or wrist, but they can also develop in the shoulder, elbow, hip, knee, ankle, or foot. Ganglion cysts are ball-shaped or egg-shaped. Their size can range from the size of a pea to larger than a grape. Increased activity may cause the cyst to get bigger because more fluid starts to build up. What are the causes? The exact cause of this condition is not known, but it may be related to:  Inflammation or irritation around the joint.  An injury.  Repetitive movements or overuse.  Arthritis. What increases the risk? You are more likely to develop this condition if:  You are a woman.  You are 15-40 years old. What are the signs or symptoms? The main symptom of this condition is a lump. It most often appears on the hand or wrist. In many cases, there are no other symptoms, but a cyst can sometimes cause:  Tingling.  Pain.  Numbness.  Muscle weakness.  Weak grip.  Less range of motion in a joint. How is this diagnosed? Ganglion cysts are usually diagnosed based on a physical exam. Your health care provider will feel the lump and may shine a light next to it. If it is a ganglion cyst, the light will likely shine through it. Your health care provider may order an X-ray, ultrasound, or MRI to rule out other conditions. How is this treated? Ganglion cysts often go away on their own without treatment. If you have pain or other symptoms, treatment may be needed. Treatment is also needed if the ganglion cyst limits your movement or if it gets infected. Treatment may include:  Wearing a brace or splint on your wrist or finger.  Taking anti-inflammatory medicine.  Having fluid drained from the lump with a needle (aspiration).  Getting a steroid injected into the joint.  Having  surgery to remove the ganglion cyst.  Placing a pad on your shoe or wearing shoes that will not rub against the cyst if it is on your foot. Follow these instructions at home:  Do not press on the ganglion cyst, poke it with a needle, or hit it.  Take over-the-counter and prescription medicines only as told by your health care provider.  If you have a brace or splint: ? Wear it as told by your health care provider. ? Remove it as told by your health care provider. Ask if you need to remove it when you take a shower or a bath.  Watch your ganglion cyst for any changes.  Keep all follow-up visits as told by your health care provider. This is important. Contact a health care provider if:  Your ganglion cyst becomes larger or more painful.  You have pus coming from the lump.  You have weakness or numbness in the affected area.  You have a fever or chills. Get help right away if:  You have a fever and have any of these in the cyst area: ? Increased redness. ? Red streaks. ? Swelling. Summary  A ganglion cyst is a non-cancerous, fluid-filled lump that occurs near a joint or tendon.  Ganglion cysts most often develop in the hand or wrist, but they can also develop in the shoulder, elbow, hip, knee, ankle, or foot.  Ganglion cysts often go away on their own without treatment.   This information is not intended to replace advice given to you by your health care provider. Make sure you discuss any questions you have with your health care provider. Document Revised: 01/14/2017 Document Reviewed: 10/01/2016 Elsevier Patient Education  2020 [employer]  

## 2019-05-31 NOTE — Assessment & Plan Note (Signed)
Chronic, ongoing.  With recent minor irritation and sweling to area, will re-send referral to orthopedic surgery for further evaluation.  In meantime, advised to ice and/or heat area and okay to take Tylenol as needed for pain.

## 2019-05-31 NOTE — Assessment & Plan Note (Signed)
Chronic, ongoing.  BP elevated in clinic today.  Patient reports recent stressor of caring for husband and with news about [employer] vaccine.  Continue medication and advised to monitor BP at home.  Has follow up with PCP scheduled for early next week to discuss blood pressure.  Continue DASH diet in meantime.

## 2019-05-31 NOTE — Progress Notes (Signed)
BP (!) 178/80 (BP Location: Left Arm, Cuff Size: Normal)   Pulse 87   Temp (!) 97.5 F (36.4 C) (Oral)   LMP  (LMP Unknown)   SpO2 97%    Subjective:    Patient ID: Carol [name], female    DOB: 06-16-1950, 69 y.o.   MRN: [id number]  HPI: Carol [name] is a 69 y.o. female presenting with cyst on left wrist.  Chief Complaint  Patient presents with  . Cyst    on L wrist, states it has been there for a while. States she hit it on something a few days ago and it hurt al day yesterday   GANGLION CYST Patient reports having a ganglion cyst for "quite some time."  Previously saw an Orthopedic Surgeon for same, elected to monitor as long as it did not become too bothersome.  Yesterday, she hit her wrist while moving an object and a severe pain developed that moved all of the way up her arm.  The pain has gotten better, however the size of the cyst has increased.  She is requesting another referral to the Orthopedic Surgeon to "have it taken care of." Duration: months Location: left wrist Painful: yes Itching: no  Oozing: yes Onset: gradual Context: bigger  ROM: full to fingers, wrist, and arm Associated signs and symptoms: no redness, oozing, fever, nausea, or vomiting History of skin cancer: no History of precancerous skin lesions: no Family history of skin cancer: no  No Known Allergies  Outpatient Encounter Medications as of 05/31/2019  Medication Sig  . aspirin 81 MG tablet Take 81 mg by mouth daily.  [name] atorvastatin (LIPITOR) 10 MG tablet Take one tablet (10 MG) by mouth every other day.  . fluticasone (FLONASE) 50 MCG/ACT nasal spray INHALE TWO PUFFS IN EACH NOSTRIL ONCE A DAY  . loratadine (CLARITIN) 10 MG tablet Take 10 mg by mouth daily.  [name] losartan (COZAAR) 50 MG tablet Take 1 tablet (50 mg total) by mouth daily.  . meclizine (ANTIVERT) 25 MG tablet Take 1 tablet (25 mg total) by mouth 3 (three) times daily as needed for dizziness.  . nystatin cream (MYCOSTATIN) Apply  1 application topically as needed for dry skin (twice a day as needed to groin area for itching).   No facility-administered encounter medications on file as of 05/31/2019.   Patient Active Problem List   Diagnosis Date Noted  . Proteinuria 09/26/2018  . Elevated hemoglobin A1c 04/14/2018  . Elevated TSH 12/26/2017  . Ganglion of left wrist 12/14/2017  . Hyperlipidemia 09/13/2017  . Onychomycosis of toenail 02/01/2017  . Advanced care planning/counseling discussion 02/01/2017  . Hypertension 01/13/2015  . Allergic rhinitis 01/13/2015  . Osteoarthritis 01/13/2015  . Alkaline phosphatase elevation 01/13/2015  . Eczema 01/13/2015  . 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 Alkaline phosphatase elevation   . Allergy   . Eczema   . Hypertension   . Muscle cramps   . Osteoarthritis   . Osteoporosis    Relevant past medical, surgical, family and social history reviewed and updated as indicated. Interim medical history since our last visit reviewed.  Review of Systems  Constitutional: Negative.  Negative for activity change, appetite change, fatigue and fever.  Gastrointestinal: Negative.  Negative for nausea and vomiting.  Musculoskeletal: Positive for myalgias (left arm). Negative for arthralgias, gait problem and joint swelling.       Cyst on left wrist  Skin: Negative.  Negative for color change, pallor, rash and wound.  Neurological: Negative.   Psychiatric/Behavioral: Negative.     Per HPI unless specifically indicated above     Objective:    BP (!) 178/80 (BP Location: Left Arm, Cuff Size: Normal)   Pulse 87   Temp (!) 97.5 F (36.4 C) (Oral)   LMP  (LMP Unknown)   SpO2 97%   Wt Readings from Last 3 Encounters:  05/08/19 158 lb (71.7 kg)  04/03/19 158 lb (71.7 kg)  01/02/19 158 lb (71.7 kg)    Physical Exam Constitutional:      General: She is not in acute distress.    Appearance: Normal  appearance.  Abdominal:     General: Abdomen is flat. There is no distension.  Musculoskeletal:     Right wrist: Normal.     Left wrist: Swelling (ganglion cyst noted to wrist) and bony tenderness present. Normal range of motion. Normal pulse.  Skin:    General: Skin is warm and dry.     Coloration: Skin is not jaundiced or pale.  Neurological:     General: No focal deficit present.     Mental Status: She is alert and oriented to person, place, and time.     Motor: No weakness.     Gait: Gait normal.  Psychiatric:        Mood and Affect: Mood normal.        Behavior: Behavior normal.        Thought Content: Thought content normal.        Judgment: Judgment normal.     Results for orders placed or performed in visit on 05/07/19  Fecal occult blood, imunochemical  Result Value Ref Range   IFOBT Negative       Assessment & Plan:   Problem List Items Addressed This Visit      Cardiovascular and Mediastinum   Hypertension    Chronic, ongoing.  BP elevated in clinic today.  Patient reports recent stressor of caring for husband and with news about [employer] vaccine.  Continue medication and advised to monitor BP at home.  Has follow up with PCP scheduled for early next week to discuss blood pressure.  Continue DASH diet in meantime.          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  With recent minor irritation and sweling to area, will re-send referral to orthopedic surgery for further evaluation.  In meantime, advised to ice and/or heat area and okay to take Tylenol as needed for pain.      Relevant Orders   Ambulatory referral to Orthopedic Surgery       Follow up plan: Return for as scheduled with Jolene on Tuesday.

## 2019-06-05 ENCOUNTER — Ambulatory Visit (INDEPENDENT_AMBULATORY_CARE_PROVIDER_SITE_OTHER): Payer: Medicare Other | Admitting: Nurse Practitioner

## 2019-06-05 ENCOUNTER — Encounter: Payer: Self-pay | Admitting: Nurse Practitioner

## 2019-06-05 ENCOUNTER — Other Ambulatory Visit: Payer: Self-pay

## 2019-06-05 VITALS — BP 130/72 | HR 87 | Temp 97.5°F | Wt 158.0 lb

## 2019-06-05 DIAGNOSIS — I10 Essential (primary) hypertension: Secondary | ICD-10-CM

## 2019-06-05 DIAGNOSIS — E782 Mixed hyperlipidemia: Secondary | ICD-10-CM

## 2019-06-05 MED ORDER — LOSARTAN POTASSIUM 50 MG PO TABS: 25.0000 mg | ORAL_TABLET | Freq: Every day | ORAL | 4 refills | Status: DC

## 2019-06-05 NOTE — Assessment & Plan Note (Signed)
Chronic, ongoing. BP in office today below goal.  She does have increased stressors lately and is not checking at home due to this.  Recommend she monitor at least a few times a week at home.  Continue Losartan 25 MG daily and adjust as needed -- she tolerated increase in this dose to 50 MG in past and had dizziness.  Recent labs in March.  Focus on DASH diet at home.  Return to office in 5 weeks for BP check and reassurance.

## 2019-06-05 NOTE — Patient Instructions (Signed)
DASH Eating Plan DASH stands for "Dietary Approaches to Stop Hypertension." The DASH eating plan is a healthy eating plan that has been shown to reduce high blood pressure (hypertension). It may also reduce your risk for type 2 diabetes, heart disease, and stroke. The DASH eating plan may also help with weight loss. What are tips for following this plan?  General guidelines  Avoid eating more than 2,300 mg (milligrams) of salt (sodium) a day. If you have hypertension, you may need to reduce your sodium intake to 1,500 mg a day.  Limit alcohol intake to no more than 1 drink a day for nonpregnant women and 2 drinks a day for men. One drink equals 12 oz of beer, 5 oz of wine, or 1 oz of hard liquor.  Work with your health care provider to maintain a healthy body weight or to lose weight. Ask what an ideal weight is for you.  Get at least 30 minutes of exercise that causes your heart to beat faster (aerobic exercise) most days of the week. Activities may include walking, swimming, or biking.  Work with your health care provider or diet and nutrition specialist (dietitian) to adjust your eating plan to your individual calorie needs. Reading food labels   Check food labels for the amount of sodium per serving. Choose foods with less than 5 percent of the Daily Value of sodium. Generally, foods with less than 300 mg of sodium per serving fit into this eating plan.  To find whole grains, look for the word "whole" as the first word in the ingredient list. Shopping  Buy products labeled as "low-sodium" or "no salt added."  Buy fresh foods. Avoid canned foods and premade or frozen meals. Cooking  Avoid adding salt when cooking. Use salt-free seasonings or herbs instead of table salt or sea salt. Check with your health care provider or pharmacist before using salt substitutes.  Do not fry foods. Cook foods using healthy methods such as baking, boiling, grilling, and broiling instead.  Cook with  heart-healthy oils, such as olive, canola, soybean, or sunflower oil. Meal planning  Eat a balanced diet that includes: ? 5 or more servings of fruits and vegetables each day. At each meal, try to fill half of your plate with fruits and vegetables. ? Up to 6-8 servings of whole grains each day. ? Less than 6 oz of lean meat, poultry, or fish each day. A 3-oz serving of meat is about the same size as a deck of cards. One egg equals 1 oz. ? 2 servings of low-fat dairy each day. ? A serving of nuts, seeds, or beans 5 times each week. ? Heart-healthy fats. Healthy fats called Omega-3 fatty acids are found in foods such as flaxseeds and coldwater fish, like sardines, salmon, and mackerel.  Limit how much you eat of the following: ? Canned or prepackaged foods. ? Food that is high in trans fat, such as fried foods. ? Food that is high in saturated fat, such as fatty meat. ? Sweets, desserts, sugary drinks, and other foods with added sugar. ? Full-fat dairy products.  Do not salt foods before eating.  Try to eat at least 2 vegetarian meals each week.  Eat more home-cooked food and less restaurant, buffet, and fast food.  When eating at a restaurant, ask that your food be prepared with less salt or no salt, if possible. What foods are recommended? The items listed may not be a complete list. Talk with your dietitian about   what dietary choices are best for you. Grains Whole-grain or whole-wheat bread. Whole-grain or whole-wheat pasta. Brown rice. Oatmeal. Quinoa. Bulgur. Whole-grain and low-sodium cereals. Pita bread. Low-fat, low-sodium crackers. Whole-wheat flour tortillas. Vegetables Fresh or frozen vegetables (raw, steamed, roasted, or grilled). Low-sodium or reduced-sodium tomato and vegetable juice. Low-sodium or reduced-sodium tomato sauce and tomato paste. Low-sodium or reduced-sodium canned vegetables. Fruits All fresh, dried, or frozen fruit. Canned fruit in natural juice (without  added sugar). Meat and other protein foods Skinless chicken or turkey. Ground chicken or turkey. Pork with fat trimmed off. Fish and seafood. Egg whites. Dried beans, peas, or lentils. Unsalted nuts, nut butters, and seeds. Unsalted canned beans. Lean cuts of beef with fat trimmed off. Low-sodium, lean deli meat. Dairy Low-fat (1%) or fat-free (skim) milk. Fat-free, low-fat, or reduced-fat cheeses. Nonfat, low-sodium ricotta or cottage cheese. Low-fat or nonfat yogurt. Low-fat, low-sodium cheese. Fats and oils Soft margarine without trans fats. Vegetable oil. Low-fat, reduced-fat, or light mayonnaise and salad dressings (reduced-sodium). Canola, safflower, olive, soybean, and sunflower oils. Avocado. Seasoning and other foods Herbs. Spices. Seasoning mixes without salt. Unsalted popcorn and pretzels. Fat-free sweets. What foods are not recommended? The items listed may not be a complete list. Talk with your dietitian about what dietary choices are best for you. Grains Baked goods made with fat, such as croissants, muffins, or some breads. Dry pasta or rice meal packs. Vegetables Creamed or fried vegetables. Vegetables in a cheese sauce. Regular canned vegetables (not low-sodium or reduced-sodium). Regular canned tomato sauce and paste (not low-sodium or reduced-sodium). Regular tomato and vegetable juice (not low-sodium or reduced-sodium). Pickles. Olives. Fruits Canned fruit in a light or heavy syrup. Fried fruit. Fruit in cream or butter sauce. Meat and other protein foods Fatty cuts of meat. Ribs. Fried meat. Bacon. Sausage. Bologna and other processed lunch meats. Salami. Fatback. Hotdogs. Bratwurst. Salted nuts and seeds. Canned beans with added salt. Canned or smoked fish. Whole eggs or egg yolks. Chicken or turkey with skin. Dairy Whole or 2% milk, cream, and half-and-half. Whole or full-fat cream cheese. Whole-fat or sweetened yogurt. Full-fat cheese. Nondairy creamers. Whipped toppings.  Processed cheese and cheese spreads. Fats and oils Butter. Stick margarine. Lard. Shortening. Ghee. Bacon fat. Tropical oils, such as coconut, palm kernel, or palm oil. Seasoning and other foods Salted popcorn and pretzels. Onion salt, garlic salt, seasoned salt, table salt, and sea salt. Worcestershire sauce. Tartar sauce. Barbecue sauce. Teriyaki sauce. Soy sauce, including reduced-sodium. Steak sauce. Canned and packaged gravies. Fish sauce. Oyster sauce. Cocktail sauce. Horseradish that you find on the shelf. Ketchup. Mustard. Meat flavorings and tenderizers. Bouillon cubes. Hot sauce and Tabasco sauce. Premade or packaged marinades. Premade or packaged taco seasonings. Relishes. Regular salad dressings. Where to find more information:  National Heart, Lung, and Blood Institute: www.nhlbi.nih.gov  American Heart Association: www.heart.org Summary  The DASH eating plan is a healthy eating plan that has been shown to reduce high blood pressure (hypertension). It may also reduce your risk for type 2 diabetes, heart disease, and stroke.  With the DASH eating plan, you should limit salt (sodium) intake to 2,300 mg a day. If you have hypertension, you may need to reduce your sodium intake to 1,500 mg a day.  When on the DASH eating plan, aim to eat more fresh fruits and vegetables, whole grains, lean proteins, low-fat dairy, and heart-healthy fats.  Work with your health care provider or diet and nutrition specialist (dietitian) to adjust your eating plan to your   individual calorie needs. This information is not intended to replace advice given to you by your health care provider. Make sure you discuss any questions you have with your health care provider. Document Revised: 01/14/2017 Document Reviewed: 01/26/2016 Elsevier Patient Education  2020 Else[employer] Inc.  

## 2019-06-05 NOTE — Assessment & Plan Note (Signed)
Chronic, ongoing.  Continues every other day statin which has decreased side effects.  Lipid panel in August next. Adjust dose as needed.

## 2019-06-05 NOTE — Progress Notes (Signed)
BP 130/72 (BP Location: Left Arm, Patient Position: Sitting, Cuff Size: Normal)   Pulse 87   Temp (!) 97.5 F (36.4 C) (Oral)   Wt 158 lb (71.7 kg)   LMP  (LMP Unknown)   SpO2 97%   BMI 27.99 kg/m    Subjective:    Patient ID: Carol [name], female    DOB: 12-Oct-1950, 69 y.o.   MRN: [id number]  HPI: Carol [name] is a 69 y.o. female  Chief Complaint  Patient presents with  . Hypertension  . Hyperlipidemia   HYPERTENSION / HYPERLIPIDEMIA Continues on Losartan 25 MG daily and Atorvastatin 10 MG QOTHERDAY.  Has lots of stressors lately with her husband's health and feels this effects her BP.  Likes to come in for BP checks and ensure this is stable.  Recent labs in March. Satisfied with current treatment? yes Duration of hypertension: chronic BP monitoring frequency: not checking BP range:  BP medication side effects: no Duration of hyperlipidemia: chronic Cholesterol medication side effects: no Cholesterol supplements: none Medication compliance: good compliance Aspirin: yes Recent stressors: no Recurrent headaches: no Visual changes: no Palpitations: no Dyspnea: no Chest pain: no Lower extremity edema: no Dizzy/lightheaded: no  Relevant past medical, surgical, family and social history reviewed and updated as indicated. Interim medical history since our last visit reviewed. Allergies and medications reviewed and updated.  Review of Systems  Constitutional: Negative for activity change, appetite change, diaphoresis, fatigue and fever.  Respiratory: Negative for cough, chest tightness and shortness of breath.   Cardiovascular: Negative for chest pain, palpitations and leg swelling.  Gastrointestinal: Negative.   Neurological: Negative.   Psychiatric/Behavioral: Negative.     Per HPI unless specifically indicated above     Objective:    BP 130/72 (BP Location: Left Arm, Patient Position: Sitting, Cuff Size: Normal)   Pulse 87   Temp (!) 97.5 F (36.4 C)  (Oral)   Wt 158 lb (71.7 kg)   LMP  (LMP Unknown)   SpO2 97%   BMI 27.99 kg/m   Wt Readings from Last 3 Encounters:  06/05/19 158 lb (71.7 kg)  05/08/19 158 lb (71.7 kg)  04/03/19 158 lb (71.7 kg)    Physical Exam Vitals and nursing note reviewed.  Constitutional:      General: She is awake. She is not in acute distress.    Appearance: She is well-developed and overweight. She is not ill-appearing.  HENT:     Head: Normocephalic.     Right Ear: Hearing normal.     Left Ear: Hearing normal.  Eyes:     General: Lids are normal.        Right eye: No discharge.        Left eye: No discharge.     Conjunctiva/sclera: Conjunctivae normal.     Pupils: Pupils are equal, round, and reactive to light.  Neck:     Vascular: No carotid bruit.  Cardiovascular:     Rate and Rhythm: Normal rate and regular rhythm.     Heart sounds: Normal heart sounds. No murmur. No gallop.   Pulmonary:     Effort: Pulmonary effort is normal. No accessory muscle usage or respiratory distress.     Breath sounds: Normal breath sounds.  Abdominal:     General: Bowel sounds are normal.     Palpations: Abdomen is soft.  Musculoskeletal:     Cervical back: Normal range of motion and neck supple.     Right lower leg: No edema.  Left lower leg: No edema.  Skin:    General: Skin is warm and dry.  Neurological:     Mental Status: She is alert and oriented to person, place, and time.  Psychiatric:        Attention and Perception: Attention normal.        Mood and Affect: Mood normal.        Speech: Speech normal.        Behavior: Behavior normal. Behavior is cooperative.        Thought Content: Thought content normal.     Results for orders placed or performed in visit on 05/07/19  Fecal occult blood, imunochemical  Result Value Ref Range   IFOBT Negative       Assessment & Plan:   Problem List Items Addressed This Visit      Cardiovascular and Mediastinum   Hypertension - Primary    Chronic,  ongoing. BP in office today below goal.  She does have increased stressors lately and is not checking at home due to this.  Recommend she monitor at least a few times a week at home.  Continue Losartan 25 MG daily and adjust as needed -- she tolerated increase in this dose to 50 MG in past and had dizziness.  Recent labs in March.  Focus on DASH diet at home.  Return to office in 5 weeks for BP check and reassurance.        Relevant Medications   losartan (COZAAR) 50 MG tablet     Other   Hyperlipidemia    Chronic, ongoing.  Continues every other day statin which has decreased side effects.  Lipid panel in August next. Adjust dose as needed.      Relevant Medications   losartan (COZAAR) 50 MG tablet       Follow up plan: Return in about 5 weeks (around 07/10/2019) for HTN -- patient wants BP check.

## 2019-06-14 ENCOUNTER — Other Ambulatory Visit: Payer: Self-pay | Admitting: Nurse Practitioner

## 2019-06-14 MED ORDER — ATORVASTATIN CALCIUM 10 MG PO TABS: ORAL_TABLET | ORAL | 5 refills | Status: DC

## 2019-06-14 NOTE — Telephone Encounter (Signed)
Copied from CRM [id number]. Topic: Quick Communication - Rx Refill/Question >> Jun 14, 2019  1:39 PM [place] Small, [place] A wrote: Medication: atorvastatin (LIPITOR) 10 MG tablet (90 day supply)  Has the patient contacted their pharmacy? Yes (Agent: If no, request that the patient contact the pharmacy for the refill.) (Agent: If yes, when and what did the pharmacy advise?)Contact PCP  Preferred Pharmacy (with phone number or street name): [facility], [place] - [address]  Phone:  [phone] Fax:  [phone]     Agent: Please be advised that RX refills may take up to 3 business days. We ask that you follow-up with your pharmacy.

## 2019-06-15 ENCOUNTER — Other Ambulatory Visit: Payer: Self-pay | Admitting: Nurse Practitioner

## 2019-06-20 ENCOUNTER — Other Ambulatory Visit: Payer: Self-pay | Admitting: Nurse Practitioner

## 2019-06-20 MED ORDER — ATORVASTATIN CALCIUM 10 MG PO TABS: ORAL_TABLET | ORAL | 5 refills | Status: DC

## 2019-06-20 NOTE — Telephone Encounter (Signed)
Routing to provider. See patient's message below.

## 2019-06-20 NOTE — Telephone Encounter (Signed)
[employer] following up on Rx request to change the atorvastatin (LIPITOR) 10 MG tablet  To a 90 day /// 45 tabs Pt takes 1 every other day. Can you resend to  [facility], [place] - [address] Phone:  [phone]  Fax:  [phone]     This was sent in 30 tabs w/ 5 refills. This will be less expensive and fewer trips to pharmacy.  [employer] rep states they called last week, but I do not see a record of this. Only incomplete note. Please send today if possible, as pt is out of her medication.

## 2019-06-20 NOTE — Telephone Encounter (Signed)
Im sorry Carol [name], but can you do a 45 tab dispense for a 90 day RX per patient request?

## 2019-06-20 NOTE — Telephone Encounter (Signed)
Requested medication (s) are due for refill today:   Yes  Requested medication (s) are on the active medication list:   Yes  Future visit scheduled:   Yes in 3 wks with Aura Dials   Last ordered: 06/14/2019  #30  5 refills  Clinic note:  Pt only takes this every other day.   She is requesting a 90 day supply of 45 tablets be sent in because it's cheaper and less trips to the pharmacy.   Returned for provider review.   Requested Prescriptions  Pending Prescriptions Disp Refills   atorvastatin (LIPITOR) 10 MG tablet 30 tablet 5    Sig: Take one tablet (10 MG) by mouth every other day.      Cardiovascular:  Antilipid - Statins Passed - 06/20/2019  1:36 PM      Passed - Total Cholesterol in normal range and within 360 days    Cholesterol, Total  Date Value Ref Range Status  04/17/2019 167 100 - 199 mg/dL Final          Passed - LDL in normal range and within 360 days    LDL Chol Calc (NIH)  Date Value Ref Range Status  04/17/2019 96 0 - 99 mg/dL Final          Passed - HDL in normal range and within 360 days    HDL  Date Value Ref Range Status  04/17/2019 49 >39 mg/dL Final          Passed - Triglycerides in normal range and within 360 days    Triglycerides  Date Value Ref Range Status  04/17/2019 125 0 - 149 mg/dL Final          Passed - Patient is not pregnant      Passed - Valid encounter within last 12 months    Recent Outpatient Visits           2 weeks ago Essential hypertension   [facility] [place], Burr T, NP   2 weeks ago Ganglion of left wrist   [facility] Mardene Celeste I, NP   1 month ago Essential hypertension   [facility] [place], [place] T, NP   1 month ago Essential hypertension   [facility] [place], Marion T, NP   2 months ago Essential hypertension   [facility] [place], Dorie Rank, NP       Future Appointments             In 3 weeks [place], Dorie Rank, NP [employer], [facility]

## 2019-06-29 ENCOUNTER — Telehealth: Payer: Self-pay | Admitting: Nurse Practitioner

## 2019-06-29 NOTE — Telephone Encounter (Signed)
Spoke with pt and relayed Jolene's message.

## 2019-06-29 NOTE — Telephone Encounter (Signed)
Pt's wants to speak with provider about Pt's spouse (who is a Pt here) about paper work that she would like to have filled out for him.

## 2019-06-29 NOTE — Telephone Encounter (Signed)
Spoke to [place] briefly in office today to discuss her husband's recent CA diagnosis and consideration of hospice.  Shanda Bumps, could you please call her and let her know I know about her BP in office today, to keep upcoming 07/13/2019 appointment.  Due to current stressors in her life I expect some elevation in BP, want her to reach out to me or the chronic care team as needed for support.  Will recheck her BP at upcoming visit.  Brooke and Pam, could you reach out to [facility] for support.  Her husband is CCM patient and had recent CA diagnosis with recommendation for more comfort level care.

## 2019-07-09 ENCOUNTER — Ambulatory Visit (INDEPENDENT_AMBULATORY_CARE_PROVIDER_SITE_OTHER): Payer: Medicare Other | Admitting: General Practice

## 2019-07-09 DIAGNOSIS — I10 Essential (primary) hypertension: Secondary | ICD-10-CM | POA: Diagnosis not present

## 2019-07-09 DIAGNOSIS — E782 Mixed hyperlipidemia: Secondary | ICD-10-CM | POA: Diagnosis not present

## 2019-07-09 DIAGNOSIS — F419 Anxiety disorder, unspecified: Secondary | ICD-10-CM

## 2019-07-09 DIAGNOSIS — F439 Reaction to severe stress, unspecified: Secondary | ICD-10-CM

## 2019-07-09 NOTE — Patient Instructions (Signed)
Visit Information  Goals Addressed            This Visit's Progress   . RNCM: "Hospice has come in to help Carol [name]" (pt-stated)       [place] (see longitudinal plan of care for additional care plan information)  Current Barriers:  Carol [name] Knowledge Deficits related to stress and depression over the patients husbands  recent diagnosis of cancer with recommendations of comfort care  . Care Coordination needs related to nutritional resources and help with utilities  in a patient with HTN/HLD and stress due to husbands terminal illness (disease states) . Lacks caregiver support.  . Film/video editor.   Nurse Case Manager Clinical Goal(s):  Carol [name] Over the next 120 days, patient will work with [name], pcp and CCM team to address needs related to stress reduction and needs in the home due to the recent terminal diagnosis of her husband  . Over the next 120 days, patient will attend all scheduled medical appointments: next appointment with pcp on 07/13/2019 . Over the next 120 days, patient will work with CM clinical social worker to help with coping and grief of husbands terminal diagnosis and other needs related to financial constraints . Over the next 120 days, patient will work with care guides (community agency) to assist with food resources such as [employer] and resources to help with the high utility bills.   Interventions:  . Inter-disciplinary care team collaboration (see longitudinal plan of care) . Evaluation of current treatment plan related to stress/anxiety/depression and patient's adherence to plan as established by provider. . Advised patient to call the pcp and CCM team for questions or concerns . Provided education to patient re: community resources to help with food and other resources and to expect a call from the care guide team.  . Reviewed scheduled/upcoming provider appointments including: upcoming appointment on 07-13-2019 with pcp . Care Guide referral for  help with food resources and high utility bill . Social Work referral for coping/grief mechanisms related to new terminal illness diagnosis in the patients husband with recommendations for comfort care. Hospice is coming into the home.   Patient Self Care Activities:  . Patient verbalizes understanding of plan to work with the CCM team to meet her health and wellness needs  . Attends all scheduled provider appointments . Calls provider office for new concerns or questions . Unable to independently manage stress/anxiety/depression  Initial goal documentation     . RNCM: Pt-"I am not checking my blood pressure at home it freaks me out" (pt-stated)       [name] (see longtitudinal plan of care for additional care plan information)  Current Barriers:  . Chronic Disease Management support, education, and care coordination needs related to HTN and HLD  Clinical Goal(s) related to HTN and HLD:  Over the next 120 days, patient will:  . Work with the care management team to address educational, disease management, and care coordination needs  . Begin or continue self health monitoring activities as directed today  adhere to a heart healthy diet and monitor blood pressure if instructed by pcp . Call provider office for new or worsened signs and symptoms New or worsened symptom related to HTN/HLD . Call care management team with questions or concerns . Verbalize basic understanding of patient centered plan of care established today  Interventions related to HTN and HLD:  . Evaluation of current treatment plans and patient's adherence to plan as established by provider . Assessed  patient understanding of disease states . Assessed patient's education and care coordination needs . Provided disease specific education to patient  . Collaborated with appropriate clinical care team members regarding patient needs  Patient Self Care Activities related to HTN and HLD:  . Patient is unable to  independently self-manage chronic health conditions  Initial goal documentation        Carol [name] was given information about Chronic Care Management services today including:  1. CCM service includes personalized support from designated clinical staff supervised by her physician, including individualized plan of care and coordination with other care providers 2. 24/7 contact phone numbers for assistance for urgent and routine care needs. 3. Service will only be billed when office clinical staff spend 20 minutes or more in a month to coordinate care. 4. Only one practitioner may furnish and bill the service in a calendar month. 5. The patient may stop CCM services at any time (effective at the end of the month) by phone call to the office staff. 6. The patient will be responsible for cost sharing (co-pay) of up to 20% of the service fee (after annual deductible is met).  Patient agreed to services and verbal consent obtained.   Patient verbalizes understanding of instructions provided today.   The care management team will reach out to the patient again over the next 30 to 60 days.   Carol Larsson RN, MSN, [facility] Mobile: [phone]   Mindfulness-Based Stress Reduction Mindfulness-based stress reduction (MBSR) is a program that helps people learn to practice mindfulness. Mindfulness is the practice of intentionally paying attention to the present moment. It can be learned and practiced through techniques such as education, breathing exercises, meditation, and yoga. MBSR includes several mindfulness techniques in one program. MBSR works best when you understand the treatment, are willing to try new things, and can commit to spending time practicing what you learn. MBSR training may include learning about:  How your emotions, thoughts, and reactions affect your body.  New ways to respond to things that  cause negative thoughts to start (triggers).  How to notice your thoughts and let go of them.  Practicing awareness of everyday things that you normally do without thinking.  The techniques and goals of different types of meditation. What are the benefits of MBSR? MBSR can have many benefits, which include helping you to:  Develop self-awareness. This refers to knowing and understanding yourself.  Learn skills and attitudes that help you to participate in your own health care.  Learn new ways to care for yourself.  Be more accepting about how things are, and let things go.  Be less judgmental and approach things with an open mind.  Be patient with yourself and trust yourself more. MBSR has also been shown to:  Reduce negative emotions, such as depression and anxiety.  Improve memory and focus.  Change how you sense and approach pain.  Boost your body's ability to fight infections.  Help you connect better with other people.  Improve your sense of well-being. Follow these instructions at home:   Find a local in-person or online MBSR program.  Set aside some time regularly for mindfulness practice.  Find a mindfulness practice that works best for you. This may include one or more of the following: ? Meditation. Meditation involves focusing your mind on a certain thought or activity. ? Breathing awareness exercises. These help you to stay present by focusing on  your breath. ? Body scan. For this practice, you lie down and pay attention to each part of your body from head to toe. You can identify tension and soreness and intentionally relax parts of your body. ? Yoga. Yoga involves stretching and breathing, and it can improve your ability to move and be flexible. It can also provide an experience of testing your body's limits, which can help you release stress. ? Mindful eating. This way of eating involves focusing on the taste, texture, color, and smell of each bite of  food. Because this slows down eating and helps you feel full sooner, it can be an important part of a weight-loss plan.  Find a podcast or recording that provides guidance for breathing awareness, body scan, or meditation exercises. You can listen to these any time when you have a free moment to rest without distractions.  Follow your treatment plan as told by your health care provider. This may include taking regular medicines and making changes to your diet or lifestyle as recommended. How to practice mindfulness To do a basic awareness exercise:  Find a comfortable place to sit.  Pay attention to the present moment. Observe your thoughts, feelings, and surroundings just as they are.  Avoid placing judgment on yourself, your feelings, or your surroundings. Make note of any judgment that comes up, and let it go.  Your mind may wander, and that is okay. Make note of when your thoughts drift, and return your attention to the present moment. To do basic mindfulness meditation:  Find a comfortable place to sit. This may include a stable chair or a firm floor cushion. ? Sit upright with your back straight. Let your arms fall next to your side with your hands resting on your legs. ? If sitting in a chair, rest your feet flat on the floor. ? If sitting on a cushion, cross your legs in front of you.  Keep your head in a neutral position with your chin dropped slightly. Relax your jaw and rest the tip of your tongue on the roof of your mouth. Drop your gaze to the floor. You can close your eyes if you like.  Breathe normally and pay attention to your breath. Feel the air moving in and out of your nose. Feel your belly expanding and relaxing with each breath.  Your mind may wander, and that is okay. Make note of when your thoughts drift, and return your attention to your breath.  Avoid placing judgment on yourself, your feelings, or your surroundings. Make note of any judgment or feelings that  come up, let them go, and bring your attention back to your breath.  When you are ready, lift your gaze or open your eyes. Pay attention to how your body feels after the meditation. Where to find more information You can find more information about MBSR from:  Your health care provider.  Community-based meditation centers or programs.  Programs offered near you. Summary  Mindfulness-based stress reduction (MBSR) is a program that teaches you how to intentionally pay attention to the present moment. It is used with other treatments to help you cope better with daily stress, emotions, and pain.  MBSR focuses on developing self-awareness, which allows you to respond to life stress without judgment or negative emotions.  MBSR programs may involve learning different mindfulness practices, such as breathing exercises, meditation, yoga, body scan, or mindful eating. Find a mindfulness practice that works best for you, and set aside time for it on  a regular basis. This information is not intended to replace advice given to you by your health care provider. Make sure you discuss any questions you have with your health care provider. Document Revised: 01/14/2017 Document Reviewed: 06/10/2016 Elsevier Patient Education  [place].

## 2019-07-09 NOTE — Chronic Care Management (AMB) (Signed)
Chronic Care Management   Initial Visit Note  07/09/2019 Name: Carol [name] MRN: [id number] DOB: [birth year]  Referred by: Venita Lick, NP Reason for referral : Chronic Care Management (Initial from [place]: HTN/HLD/Stress)   Carol [name] is a 69 y.o. year old female who is a primary care patient of Cannady, Barbaraann Faster, NP. The CCM team was consulted for assistance with chronic disease management and care coordination needs related to HTN, HLD, Anxiety and stress over recent terminal illness diagnosis of her husband  Review of patient status, including review of consultants reports, relevant laboratory and other test results, and collaboration with appropriate care team members and the patient's provider was performed as part of comprehensive patient evaluation and provision of chronic care management services.    SDOH (Social Determinants of Health) assessments performed: Yes See Care Plan activities for detailed interventions related to SDOH  SDOH Interventions     Most Recent Value  SDOH Interventions  Physical Activity Interventions  Other (Comments) [the patient is primary caregiver of her elderly husband, no structured activity]       Medications: Outpatient Encounter Medications as of 07/09/2019  Medication Sig  . aspirin 81 MG tablet Take 81 mg by mouth daily.  [name] atorvastatin (LIPITOR) 10 MG tablet Take one tablet (10 MG) by mouth every other day.  . fluticasone (FLONASE) 50 MCG/ACT nasal spray INHALE TWO PUFFS IN EACH NOSTRIL ONCE A DAY  . loratadine (CLARITIN) 10 MG tablet Take 10 mg by mouth daily.  [name] losartan (COZAAR) 50 MG tablet Take 0.5 tablets (25 mg total) by mouth daily.  . meclizine (ANTIVERT) 25 MG tablet Take 1 tablet (25 mg total) by mouth 3 (three) times daily as needed for dizziness.  . nystatin cream (MYCOSTATIN) Apply 1 application topically as needed for dry skin (twice a day as needed to groin area for itching).   No facility-administered encounter  medications on file as of 07/09/2019.     Objective:  BP Readings from Last 3 Encounters:  06/05/19 130/72  05/31/19 (!) 178/80  05/08/19 118/72    Goals Addressed            This Visit's Progress   . RNCM: "Hospice has come in to help Carol [name]" (pt-stated)       [place] (see longitudinal plan of care for additional care plan information)  Current Barriers:  [name] Knowledge Deficits related to stress and depression over the patients husbands  recent diagnosis of cancer with recommendations of comfort care  . Care Coordination needs related to nutritional resources and help with utilities  in a patient with HTN/HLD and stress due to husbands terminal illness (disease states) . Lacks caregiver support.  . Film/video editor.   Nurse Case Manager Clinical Goal(s):  [name] Over the next 120 days, patient will work with [place], pcp and CCM team to address needs related to stress reduction and needs in the home due to the recent terminal diagnosis of her husband  . Over the next 120 days, patient will attend all scheduled medical appointments: next appointment with pcp on 07/13/2019 . Over the next 120 days, patient will work with CM clinical social worker to help with coping and grief of husbands terminal diagnosis and other needs related to financial constraints . Over the next 120 days, patient will work with care guides (community agency) to assist with food resources such as [employer] and resources to help with the high utility bills.   Interventions:  .  Inter-disciplinary care team collaboration (see longitudinal plan of care) . Evaluation of current treatment plan related to stress/anxiety/depression and patient's adherence to plan as established by provider. . Advised patient to call the pcp and CCM team for questions or concerns . Provided education to patient re: community resources to help with food and other resources and to expect a call from the care guide team.    . Reviewed scheduled/upcoming provider appointments including: upcoming appointment on 07-13-2019 with pcp . Care Guide referral for help with food resources and high utility bill . Social Work referral for coping/grief mechanisms related to new terminal illness diagnosis in the patients husband with recommendations for comfort care. Hospice is coming into the home.   Patient Self Care Activities:  . Patient verbalizes understanding of plan to work with the CCM team to meet her health and wellness needs  . Attends all scheduled provider appointments . Calls provider office for new concerns or questions . Unable to independently manage stress/anxiety/depression  Initial goal documentation     . RNCM: Pt-"I am not checking my blood pressure at home it freaks me out" (pt-stated)       Carol [name] (see longtitudinal plan of care for additional care plan information)  Current Barriers:  . Chronic Disease Management support, education, and care coordination needs related to HTN and HLD  Clinical Goal(s) related to HTN and HLD:  Over the next 120 days, patient will:  . Work with the care management team to address educational, disease management, and care coordination needs  . Begin or continue self health monitoring activities as directed today  adhere to a heart healthy diet and monitor blood pressure if instructed by pcp . Call provider office for new or worsened signs and symptoms New or worsened symptom related to HTN/HLD . Call care management team with questions or concerns . Verbalize basic understanding of patient centered plan of care established today  Interventions related to HTN and HLD:  . Evaluation of current treatment plans and patient's adherence to plan as established by provider . Assessed patient understanding of disease states . Assessed patient's education and care coordination needs . Provided disease specific education to patient  . Collaborated with appropriate  clinical care team members regarding patient needs  Patient Self Care Activities related to HTN and HLD:  . Patient is unable to independently self-manage chronic health conditions  Initial goal documentation         Ms. Wildes was given information about Chronic Care Management services today including:  1. CCM service includes personalized support from designated clinical staff supervised by her physician, including individualized plan of care and coordination with other care providers 2. 24/7 contact phone numbers for assistance for urgent and routine care needs. 3. Service will only be billed when office clinical staff spend 20 minutes or more in a month to coordinate care. 4. Only one practitioner may furnish and bill the service in a calendar month. 5. The patient may stop CCM services at any time (effective at the end of the month) by phone call to the office staff. 6. The patient will be responsible for cost sharing (co-pay) of up to 20% of the service fee (after annual deductible is met).  Patient agreed to services and verbal consent obtained.   Plan:   The care management team will reach out to the patient again over the next 30 to 60 days.   [name] RN, MSN, [place]  [employer] manager: [phone]

## 2019-07-13 ENCOUNTER — Encounter: Payer: Self-pay | Admitting: Nurse Practitioner

## 2019-07-13 ENCOUNTER — Other Ambulatory Visit: Payer: Self-pay

## 2019-07-13 ENCOUNTER — Ambulatory Visit (INDEPENDENT_AMBULATORY_CARE_PROVIDER_SITE_OTHER): Payer: Medicare Other | Admitting: Nurse Practitioner

## 2019-07-13 VITALS — BP 128/64 | HR 78 | Temp 97.5°F | Wt 157.4 lb

## 2019-07-13 DIAGNOSIS — I10 Essential (primary) hypertension: Secondary | ICD-10-CM | POA: Diagnosis not present

## 2019-07-13 DIAGNOSIS — E782 Mixed hyperlipidemia: Secondary | ICD-10-CM | POA: Diagnosis not present

## 2019-07-13 DIAGNOSIS — R7989 Other specified abnormal findings of blood chemistry: Secondary | ICD-10-CM

## 2019-07-13 DIAGNOSIS — R7309 Other abnormal glucose: Secondary | ICD-10-CM | POA: Diagnosis not present

## 2019-07-13 NOTE — Assessment & Plan Note (Signed)
Chronic, ongoing. BP in office today below goal.  She does have increased stressors lately and is not checking at home due to this.  Recommend she monitor at least a few times a week at home.  Continue Losartan 25 MG daily and adjust as needed -- she did not tolerate increase in this dose to 50 MG in past and had dizziness.  BMP today.  Focus on DASH diet at home.  Return to office in 7 weeks for BP check and reassurance.

## 2019-07-13 NOTE — Assessment & Plan Note (Signed)
Recheck TSH today, no current symptoms.

## 2019-07-13 NOTE — Progress Notes (Signed)
BP 128/64   Pulse 78   Temp (!) 97.5 F (36.4 C) (Oral)   Wt 157 lb 6.4 oz (71.4 kg)   LMP  (LMP Unknown)   SpO2 98%   BMI 27.88 kg/m    Subjective:    Patient ID: Carol [name], female    DOB: Nov 02, 1950, 69 y.o.   MRN: [id number]  HPI: Carol [name] is a 69 y.o. female  Chief Complaint  Patient presents with  . Hyperlipidemia  . Hypertension  . Prediabetes   PREDIABETES Last A1C 5.9% in March and patient wished to follow-up on labs with diet changes.   Polydipsia/polyuria: no Visual disturbance: no Chest pain: no Paresthesias: no Pneumovax: Up to Date Influenza: Up to Date Aspirin: yes   HYPERTENSION / HYPERLIPIDEMIA Continues on Losartan 25 MG daily.  Continues On Lipitor every other day. Has history of elevation in TSH, but recent levels have remained normal. Satisfied with current treatment? yes Duration of hypertension: chronic BP monitoring frequency: not checking BP range:  BP medication side effects: no Duration of hyperlipidemia: chronic Cholesterol medication side effects: no Cholesterol supplements: none Medication compliance: good compliance Aspirin: yes Recent stressors: no Recurrent headaches: no Visual changes: no Palpitations: no Dyspnea: no Chest pain: no Lower extremity edema: no Dizzy/lightheaded: no  Relevant past medical, surgical, family and social history reviewed and updated as indicated. Interim medical history since our last visit reviewed. Allergies and medications reviewed and updated.  Review of Systems  Constitutional: Negative for activity change, appetite change, diaphoresis, fatigue and fever.  Respiratory: Negative for cough, chest tightness and shortness of breath.   Cardiovascular: Negative for chest pain, palpitations and leg swelling.  Gastrointestinal: Negative.   Neurological: Negative.   Psychiatric/Behavioral: Negative.     Per HPI unless specifically indicated above     Objective:    BP 128/64    Pulse 78   Temp (!) 97.5 F (36.4 C) (Oral)   Wt 157 lb 6.4 oz (71.4 kg)   LMP  (LMP Unknown)   SpO2 98%   BMI 27.88 kg/m   Wt Readings from Last 3 Encounters:  07/13/19 157 lb 6.4 oz (71.4 kg)  06/05/19 158 lb (71.7 kg)  05/08/19 158 lb (71.7 kg)    Physical Exam Vitals and nursing note reviewed.  Constitutional:      General: She is awake. She is not in acute distress.    Appearance: She is well-developed and overweight. She is not ill-appearing.  HENT:     Head: Normocephalic.     Right Ear: Hearing normal.     Left Ear: Hearing normal.  Eyes:     General: Lids are normal.        Right eye: No discharge.        Left eye: No discharge.     Conjunctiva/sclera: Conjunctivae normal.     Pupils: Pupils are equal, round, and reactive to light.  Neck:     Vascular: No carotid bruit.  Cardiovascular:     Rate and Rhythm: Normal rate and regular rhythm.     Heart sounds: Normal heart sounds. No murmur. No gallop.   Pulmonary:     Effort: Pulmonary effort is normal. No accessory muscle usage or respiratory distress.     Breath sounds: Normal breath sounds.  Abdominal:     General: Bowel sounds are normal.     Palpations: Abdomen is soft.  Musculoskeletal:     Cervical back: Normal range of motion and neck supple.  Right lower leg: No edema.     Left lower leg: No edema.  Skin:    General: Skin is warm and dry.  Neurological:     Mental Status: She is alert and oriented to person, place, and time.  Psychiatric:        Attention and Perception: Attention normal.        Mood and Affect: Mood normal.        Speech: Speech normal.        Behavior: Behavior normal. Behavior is cooperative.        Thought Content: Thought content normal.    Results for orders placed or performed in visit on 05/07/19  Fecal occult blood, imunochemical  Result Value Ref Range   IFOBT Negative       Assessment & Plan:   Problem List Items Addressed This Visit      Cardiovascular and  Mediastinum   Hypertension    Chronic, ongoing. BP in office today below goal.  She does have increased stressors lately and is not checking at home due to this.  Recommend she monitor at least a few times a week at home.  Continue Losartan 25 MG daily and adjust as needed -- she did not tolerate increase in this dose to 50 MG in past and had dizziness.  BMP today.  Focus on DASH diet at home.  Return to office in 7 weeks for BP check and reassurance.        Relevant Orders   Basic metabolic panel     Other   Hyperlipidemia - Primary    Chronic, ongoing.  Continues every other day statin which has decreased side effects.  Lipid panel today, non fasting. Adjust dose as needed.      Relevant Orders   Lipid Panel w/o Chol/HDL Ratio   Elevated TSH    Recheck TSH today, no current symptoms.      Relevant Orders   TSH   Elevated hemoglobin A1c    Recheck A1C today and continue focus on healthy diet changes.      Relevant Orders   HgB A1c       Follow up plan: Return in about 7 weeks (around 08/31/2019) for HTN -- BP check.

## 2019-07-13 NOTE — Assessment & Plan Note (Signed)
Chronic, ongoing.  Continues every other day statin which has decreased side effects.  Lipid panel today, non fasting. Adjust dose as needed.

## 2019-07-13 NOTE — Patient Instructions (Signed)
DASH Eating Plan DASH stands for "Dietary Approaches to Stop Hypertension." The DASH eating plan is a healthy eating plan that has been shown to reduce high blood pressure (hypertension). It may also reduce your risk for type 2 diabetes, heart disease, and stroke. The DASH eating plan may also help with weight loss. What are tips for following this plan?  General guidelines  Avoid eating more than 2,300 mg (milligrams) of salt (sodium) a day. If you have hypertension, you may need to reduce your sodium intake to 1,500 mg a day.  Limit alcohol intake to no more than 1 drink a day for nonpregnant women and 2 drinks a day for men. One drink equals 12 oz of beer, 5 oz of wine, or 1 oz of hard liquor.  Work with your health care provider to maintain a healthy body weight or to lose weight. Ask what an ideal weight is for you.  Get at least 30 minutes of exercise that causes your heart to beat faster (aerobic exercise) most days of the week. Activities may include walking, swimming, or biking.  Work with your health care provider or diet and nutrition specialist (dietitian) to adjust your eating plan to your individual calorie needs. Reading food labels   Check food labels for the amount of sodium per serving. Choose foods with less than 5 percent of the Daily Value of sodium. Generally, foods with less than 300 mg of sodium per serving fit into this eating plan.  To find whole grains, look for the word "whole" as the first word in the ingredient list. Shopping  Buy products labeled as "low-sodium" or "no salt added."  Buy fresh foods. Avoid canned foods and premade or frozen meals. Cooking  Avoid adding salt when cooking. Use salt-free seasonings or herbs instead of table salt or sea salt. Check with your health care provider or pharmacist before using salt substitutes.  Do not fry foods. Cook foods using healthy methods such as baking, boiling, grilling, and broiling instead.  Cook with  heart-healthy oils, such as olive, canola, soybean, or sunflower oil. Meal planning  Eat a balanced diet that includes: ? 5 or more servings of fruits and vegetables each day. At each meal, try to fill half of your plate with fruits and vegetables. ? Up to 6-8 servings of whole grains each day. ? Less than 6 oz of lean meat, poultry, or fish each day. A 3-oz serving of meat is about the same size as a deck of cards. One egg equals 1 oz. ? 2 servings of low-fat dairy each day. ? A serving of nuts, seeds, or beans 5 times each week. ? Heart-healthy fats. Healthy fats called Omega-3 fatty acids are found in foods such as flaxseeds and coldwater fish, like sardines, salmon, and mackerel.  Limit how much you eat of the following: ? Canned or prepackaged foods. ? Food that is high in trans fat, such as fried foods. ? Food that is high in saturated fat, such as fatty meat. ? Sweets, desserts, sugary drinks, and other foods with added sugar. ? Full-fat dairy products.  Do not salt foods before eating.  Try to eat at least 2 vegetarian meals each week.  Eat more home-cooked food and less restaurant, buffet, and fast food.  When eating at a restaurant, ask that your food be prepared with less salt or no salt, if possible. What foods are recommended? The items listed may not be a complete list. Talk with your dietitian about   what dietary choices are best for you. Grains Whole-grain or whole-wheat bread. Whole-grain or whole-wheat pasta. Brown rice. Oatmeal. Quinoa. Bulgur. Whole-grain and low-sodium cereals. Pita bread. Low-fat, low-sodium crackers. Whole-wheat flour tortillas. Vegetables Fresh or frozen vegetables (raw, steamed, roasted, or grilled). Low-sodium or reduced-sodium tomato and vegetable juice. Low-sodium or reduced-sodium tomato sauce and tomato paste. Low-sodium or reduced-sodium canned vegetables. Fruits All fresh, dried, or frozen fruit. Canned fruit in natural juice (without  added sugar). Meat and other protein foods Skinless chicken or turkey. Ground chicken or turkey. Pork with fat trimmed off. Fish and seafood. Egg whites. Dried beans, peas, or lentils. Unsalted nuts, nut butters, and seeds. Unsalted canned beans. Lean cuts of beef with fat trimmed off. Low-sodium, lean deli meat. Dairy Low-fat (1%) or fat-free (skim) milk. Fat-free, low-fat, or reduced-fat cheeses. Nonfat, low-sodium ricotta or cottage cheese. Low-fat or nonfat yogurt. Low-fat, low-sodium cheese. Fats and oils Soft margarine without trans fats. Vegetable oil. Low-fat, reduced-fat, or light mayonnaise and salad dressings (reduced-sodium). Canola, safflower, olive, soybean, and sunflower oils. Avocado. Seasoning and other foods Herbs. Spices. Seasoning mixes without salt. Unsalted popcorn and pretzels. Fat-free sweets. What foods are not recommended? The items listed may not be a complete list. Talk with your dietitian about what dietary choices are best for you. Grains Baked goods made with fat, such as croissants, muffins, or some breads. Dry pasta or rice meal packs. Vegetables Creamed or fried vegetables. Vegetables in a cheese sauce. Regular canned vegetables (not low-sodium or reduced-sodium). Regular canned tomato sauce and paste (not low-sodium or reduced-sodium). Regular tomato and vegetable juice (not low-sodium or reduced-sodium). Pickles. Olives. Fruits Canned fruit in a light or heavy syrup. Fried fruit. Fruit in cream or butter sauce. Meat and other protein foods Fatty cuts of meat. Ribs. Fried meat. Bacon. Sausage. Bologna and other processed lunch meats. Salami. Fatback. Hotdogs. Bratwurst. Salted nuts and seeds. Canned beans with added salt. Canned or smoked fish. Whole eggs or egg yolks. Chicken or turkey with skin. Dairy Whole or 2% milk, cream, and half-and-half. Whole or full-fat cream cheese. Whole-fat or sweetened yogurt. Full-fat cheese. Nondairy creamers. Whipped toppings.  Processed cheese and cheese spreads. Fats and oils Butter. Stick margarine. Lard. Shortening. Ghee. Bacon fat. Tropical oils, such as coconut, palm kernel, or palm oil. Seasoning and other foods Salted popcorn and pretzels. Onion salt, garlic salt, seasoned salt, table salt, and sea salt. Worcestershire sauce. Tartar sauce. Barbecue sauce. Teriyaki sauce. Soy sauce, including reduced-sodium. Steak sauce. Canned and packaged gravies. Fish sauce. Oyster sauce. Cocktail sauce. Horseradish that you find on the shelf. Ketchup. Mustard. Meat flavorings and tenderizers. Bouillon cubes. Hot sauce and Tabasco sauce. Premade or packaged marinades. Premade or packaged taco seasonings. Relishes. Regular salad dressings. Where to find more information:  National Heart, Lung, and Blood Institute: www.nhlbi.nih.gov  American Heart Association: www.heart.org Summary  The DASH eating plan is a healthy eating plan that has been shown to reduce high blood pressure (hypertension). It may also reduce your risk for type 2 diabetes, heart disease, and stroke.  With the DASH eating plan, you should limit salt (sodium) intake to 2,300 mg a day. If you have hypertension, you may need to reduce your sodium intake to 1,500 mg a day.  When on the DASH eating plan, aim to eat more fresh fruits and vegetables, whole grains, lean proteins, low-fat dairy, and heart-healthy fats.  Work with your health care provider or diet and nutrition specialist (dietitian) to adjust your eating plan to your   individual calorie needs. This information is not intended to replace advice given to you by your health care provider. Make sure you discuss any questions you have with your health care provider. Document Revised: 01/14/2017 Document Reviewed: 01/26/2016 Elsevier Patient Education  2020 Else[employer] Inc.  

## 2019-07-13 NOTE — Assessment & Plan Note (Signed)
Recheck A1C today and continue focus on healthy diet changes.

## 2019-07-14 LAB — BASIC METABOLIC PANEL
BUN/Creatinine Ratio: 15 (ref 12–28)
BUN: 13 mg/dL (ref 8–27)
CO2: 26 mmol/L (ref 20–29)
Calcium: 9.7 mg/dL (ref 8.7–10.3)
Chloride: 100 mmol/L (ref 96–106)
Creatinine, Ser: 0.87 mg/dL (ref 0.57–1.00)
GFR calc Af Amer: 79 mL/min/{1.73_m2} (ref >59)
GFR calc non Af Amer: 69 mL/min/{1.73_m2} (ref >59)
Glucose: 91 mg/dL (ref 65–99)
Potassium: 4 mmol/L (ref 3.5–5.2)
Sodium: 139 mmol/L (ref 134–144)

## 2019-07-14 LAB — LIPID PANEL W/O CHOL/HDL RATIO
Cholesterol, Total: 153 mg/dL (ref 100–199)
HDL: 47 mg/dL (ref >39)
LDL Chol Calc (NIH): 86 mg/dL (ref 0–99)
Triglycerides: 108 mg/dL (ref 0–149)
VLDL Cholesterol Cal: 20 mg/dL (ref 5–40)

## 2019-07-14 LAB — HEMOGLOBIN A1C
Est. average glucose Bld gHb Est-mCnc: 126 mg/dL
Hgb A1c MFr Bld: 6 % — ABNORMAL HIGH (ref 4.8–5.6)

## 2019-07-14 LAB — TSH: TSH: 3.63 u[IU]/mL (ref 0.450–4.500)

## 2019-07-15 NOTE — Progress Notes (Signed)
Good morning, please let Carol [name] know her labs have returned.  Kidney function, thyroid, and cholesterol levels continue to look good.  A1C, diabetes testing, continues to show prediabetes with A1c 6%.  Continue to focus on diet and lowering carbohydrate rich foods like bread and potatoes + foods high in sugar.  No medications needed at this time.  Have a great day!!

## 2019-07-21 ENCOUNTER — Other Ambulatory Visit: Payer: Self-pay | Admitting: Unknown Physician Specialty

## 2019-07-21 NOTE — Telephone Encounter (Signed)
Requested Prescriptions  Pending Prescriptions Disp Refills  . fluticasone (FLONASE) 50 MCG/ACT nasal spray [Pharmacy Med Name: FLUTICASONE PROPIONATE 50 MCG/ACT N] 16 g 6    Sig: SPRAY 2 SPRAYS INTO EACH NOSTRIL ONCE A DAY     Ear, Nose, and Throat: Nasal Preparations - Corticosteroids Passed - 07/21/2019  9:03 AM      Passed - Valid encounter within last 12 months    Recent Outpatient Visits          1 week ago Mixed hyperlipidemia   [facility] [place], Dorie Rank, NP   1 month ago Essential hypertension   [facility] [place], [place]ford T, NP   1 month ago Ganglion of left wrist   [facility] Mardene Celeste I, NP   2 months ago Essential hypertension   [facility] [place], Montesano T, NP   2 months ago Essential hypertension   [facility] [place], Dorie Rank, NP      Future Appointments            In 1 month Cannady, Dorie Rank, NP [employer], [facility]

## 2019-07-25 ENCOUNTER — Ambulatory Visit: Payer: Self-pay

## 2019-07-25 NOTE — Chronic Care Management (AMB) (Signed)
  Care Management   Follow Up Note   07/25/2019 Name: Carol [name] MRN: [id number] DOB: 1950/07/25  Referred by: Marjie Skiff, NP Reason for referral : Care Coordination   Carol [name] is a 69 y.o. year old female who is a primary care patient of Cannady, Dorie Rank, NP. The care management team was consulted for assistance with care management and care coordination needs.    Review of patient status, including review of consultants reports, relevant laboratory and other test results, and collaboration with appropriate care team members and the patient's provider was performed as part of comprehensive patient evaluation and provision of chronic care management services.    LCSW completed CCM outreach attempt today but was unable to reach patient successfully. A HIPPA compliant voice message was unable to be left as mailbox was full. LCSW rescheduled CCM SW appointment.  Dickie La, BSW, MSW, LCSW [facility] Care Management [place]  [facility] [email or web address] Phone: [phone]

## 2019-07-26 ENCOUNTER — Telehealth: Payer: Self-pay | Admitting: Nurse Practitioner

## 2019-07-26 NOTE — Telephone Encounter (Signed)
  Community Resource Referral   [facility] 07/26/2019  DOB: 1950-02-18   AGE: 69 y.o.   GENDER: female   PCP Marjie Skiff, NP.   Called pt regarding Community Resource Referral will place referral to congregate meals through [facility] for both Mrs. Glendening and her husband as they are older than 31 and will also be provided shelf stable food in addition to the meal delivery. She stated that she placed application for SNAP in drop box at DSS this week. Discussed LIE[employer] program and informed her that I will be sending her a letter with more information for when the application becomes available.  Manuela Schwartz  Care Guide . Embedded Care Coordination [facility] Management Samara [email or web address]  [phone]

## 2019-07-26 NOTE — Telephone Encounter (Signed)
Email to [facility]   From: Manuela Schwartz [facility])  Sent: Thursday, July 26, 2019 3:36 PM To: [email or web address] Subject: Secure: Referral Meal Delivery - Carol [name] (Wife) - [facility]  Good Afternoon Carol [name], This pt is married to Mr. Yarexi Pawlicki sending a separate referral for him to you.   Please see attached application.  Patient Name:             Carol [name] Patient Address:         [address] [place]  Patient Age:                69 Phone:                         [phone] Practice:                      [facility]  Patient stated that she does have a freezer (and space within freezer) and a microwave available for their frozen meal delivery.  Thank you and please let me know if you have any questions,   Manuela Schwartz  Care Guide . Embedded Care Coordination [facility] Management [email or web address] [email or web address]  [phone]

## 2019-08-07 ENCOUNTER — Encounter: Payer: Self-pay | Admitting: Ophthalmology

## 2019-08-13 ENCOUNTER — Ambulatory Visit (INDEPENDENT_AMBULATORY_CARE_PROVIDER_SITE_OTHER): Payer: Medicare Other

## 2019-08-13 VITALS — Ht 63.0 in | Wt 150.0 lb

## 2019-08-13 DIAGNOSIS — Z0000 Encounter for general adult medical examination without abnormal findings: Secondary | ICD-10-CM

## 2019-08-13 NOTE — Patient Instructions (Signed)
Carol [name] , Thank you for taking time to come for your Medicare Wellness Visit. I appreciate your ongoing commitment to your health goals. Please review the following plan we discussed and let me know if I can assist you in the future.   Screening recommendations/referrals: Colonoscopy: completed cologuard, due in 3 years Mammogram: completed 02/14/2019, due 02/14/2020 Bone Density: completed 05/02/2018 Recommended yearly ophthalmology/optometry visit for glaucoma screening and checkup Recommended yearly dental visit for hygiene and checkup  Vaccinations: Influenza vaccine: completed 01/02/2019, due 09/16/2019 Pneumococcal vaccine: decline Tdap vaccine: decline Shingles vaccine: decline   Covid-19:05/04/2019  Advanced directives: Advance directive discussed with you today. Even though you declined this today please call our office should you change your mind and we can give you the proper paperwork for you to fill out.   Conditions/risks identified: none  Next appointment: Follow up in one year for your annual wellness visit    Preventive Care 65 Years and Older, Female Preventive care refers to lifestyle choices and visits with your health care provider that can promote health and wellness. What does preventive care include?  A yearly physical exam. This is also called an annual well check.  Dental exams once or twice a year.  Routine eye exams. Ask your health care provider how often you should have your eyes checked.  Personal lifestyle choices, including:  Daily care of your teeth and gums.  Regular physical activity.  Eating a healthy diet.  Avoiding tobacco and drug use.  Limiting alcohol use.  Practicing safe sex.  Taking low-dose aspirin every day.  Taking vitamin and mineral supplements as recommended by your health care provider. What happens during an annual well check? The services and screenings done by your health care provider during your annual well  check will depend on your age, overall health, lifestyle risk factors, and family history of disease. Counseling  Your health care provider may ask you questions about your:  Alcohol use.  Tobacco use.  Drug use.  Emotional well-being.  Home and relationship well-being.  Sexual activity.  Eating habits.  History of falls.  Memory and ability to understand (cognition).  Work and work Astronomer.  Reproductive health. Screening  You may have the following tests or measurements:  Height, weight, and BMI.  Blood pressure.  Lipid and cholesterol levels. These may be checked every 5 years, or more frequently if you are over 27 years old.  Skin check.  Lung cancer screening. You may have this screening every year starting at age 41 if you have a 30-pack-year history of smoking and currently smoke or have quit within the past 15 years.  Fecal occult blood test (FOBT) of the stool. You may have this test every year starting at age 51.  Flexible sigmoidoscopy or colonoscopy. You may have a sigmoidoscopy every 5 years or a colonoscopy every 10 years starting at age 23.  Hepatitis C blood test.  Hepatitis B blood test.  Sexually transmitted disease (STD) testing.  Diabetes screening. This is done by checking your blood sugar (glucose) after you have not eaten for a while (fasting). You may have this done every 1-3 years.  Bone density scan. This is done to screen for osteoporosis. You may have this done starting at age 30.  Mammogram. This may be done every 1-2 years. Talk to your health care provider about how often you should have regular mammograms. Talk with your health care provider about your test results, treatment options, and if necessary, the need for  more tests. Vaccines  Your health care provider may recommend certain vaccines, such as:  Influenza vaccine. This is recommended every year.  Tetanus, diphtheria, and acellular pertussis (Tdap, Td) vaccine. You  may need a Td booster every 10 years.  Zoster vaccine. You may need this after age 33.  Pneumococcal 13-valent conjugate (PCV13) vaccine. One dose is recommended after age 45.  Pneumococcal polysaccharide (PPSV23) vaccine. One dose is recommended after age 20. Talk to your health care provider about which screenings and vaccines you need and how often you need them. This information is not intended to replace advice given to you by your health care provider. Make sure you discuss any questions you have with your health care provider. Document Released: 02/28/2015 Document Revised: 10/22/2015 Document Reviewed: 12/03/2014 Elsevier Interactive Patient Education  2017 [employer] Prevention in the Home Falls can cause injuries. They can happen to people of all ages. There are many things you can do to make your home safe and to help prevent falls. What can I do on the outside of my home?  Regularly fix the edges of walkways and driveways and fix any cracks.  Remove anything that might make you trip as you walk through a door, such as a raised step or threshold.  Trim any bushes or trees on the path to your home.  Use bright outdoor lighting.  Clear any walking paths of anything that might make someone trip, such as rocks or tools.  Regularly check to see if handrails are loose or broken. Make sure that both sides of any steps have handrails.  Any raised decks and porches should have guardrails on the edges.  Have any leaves, snow, or ice cleared regularly.  Use sand or salt on walking paths during winter.  Clean up any spills in your garage right away. This includes oil or grease spills. What can I do in the bathroom?  Use night lights.  Install grab bars by the toilet and in the tub and shower. Do not use towel bars as grab bars.  Use non-skid mats or decals in the tub or shower.  If you need to sit down in the shower, use a plastic, non-slip stool.  Keep the floor  dry. Clean up any water that spills on the floor as soon as it happens.  Remove soap buildup in the tub or shower regularly.  Attach bath mats securely with double-sided non-slip rug tape.  Do not have throw rugs and other things on the floor that can make you trip. What can I do in the bedroom?  Use night lights.  Make sure that you have a light by your bed that is easy to reach.  Do not use any sheets or blankets that are too big for your bed. They should not hang down onto the floor.  Have a firm chair that has side arms. You can use this for support while you get dressed.  Do not have throw rugs and other things on the floor that can make you trip. What can I do in the kitchen?  Clean up any spills right away.  Avoid walking on wet floors.  Keep items that you use a lot in easy-to-reach places.  If you need to reach something above you, use a strong step stool that has a grab bar.  Keep electrical cords out of the way.  Do not use floor polish or wax that makes floors slippery. If you must use wax, use non-skid  floor wax.  Do not have throw rugs and other things on the floor that can make you trip. What can I do with my stairs?  Do not leave any items on the stairs.  Make sure that there are handrails on both sides of the stairs and use them. Fix handrails that are broken or loose. Make sure that handrails are as long as the stairways.  Check any carpeting to make sure that it is firmly attached to the stairs. Fix any carpet that is loose or worn.  Avoid having throw rugs at the top or bottom of the stairs. If you do have throw rugs, attach them to the floor with carpet tape.  Make sure that you have a light switch at the top of the stairs and the bottom of the stairs. If you do not have them, ask someone to add them for you. What else can I do to help prevent falls?  Wear shoes that:  Do not have high heels.  Have rubber bottoms.  Are comfortable and fit you  well.  Are closed at the toe. Do not wear sandals.  If you use a stepladder:  Make sure that it is fully opened. Do not climb a closed stepladder.  Make sure that both sides of the stepladder are locked into place.  Ask someone to hold it for you, if possible.  Clearly mark and make sure that you can see:  Any grab bars or handrails.  First and last steps.  Where the edge of each step is.  Use tools that help you move around (mobility aids) if they are needed. These include:  Canes.  Walkers.  Scooters.  Crutches.  Turn on the lights when you go into a dark area. Replace any light bulbs as soon as they burn out.  Set up your furniture so you have a clear path. Avoid moving your furniture around.  If any of your floors are uneven, fix them.  If there are any pets around you, be aware of where they are.  Review your medicines with your doctor. Some medicines can make you feel dizzy. This can increase your chance of falling. Ask your doctor what other things that you can do to help prevent falls. This information is not intended to replace advice given to you by your health care provider. Make sure you discuss any questions you have with your health care provider. Document Released: 11/28/2008 Document Revised: 07/10/2015 Document Reviewed: 03/08/2014 Elsevier Interactive Patient Education  2017 [employer].

## 2019-08-13 NOTE — Progress Notes (Signed)
I connected with Carol [name] today by telephone and verified that I am speaking with the correct person using two identifiers. Location patient: home Location provider: work Persons participating in the virtual visit: Carol SalinaLaverne Reiley, Elisha PonderNickeah Arlis Yale LPN.   I discussed the limitations, risks, security and privacy concerns of performing an evaluation and management service by telephone and the availability of in person appointments. I also discussed with the patient that there may be a patient responsible charge related to this service. The patient expressed understanding and verbally consented to this telephonic visit.    Interactive audio and video telecommunications were attempted between this provider and patient, however failed, due to patient having technical difficulties OR patient did not have access to video capability.  We continued and completed visit with audio only.   Vital signs may be missing or patient reported.    Subjective:   Carol [name] is a 69 y.o. female who presents for Medicare Annual (Subsequent) preventive examination.  Review of Systems     Cardiac Risk Factors include: advanced age (>355men, 27>65 women);dyslipidemia;hypertension;sedentary lifestyle     Objective:    Today's Vitals   08/13/19 1111  Weight: 150 lb (68 kg)  Height: 5\' 3"  (1.6 m)   Body mass index is 26.57 kg/m.  Advanced Directives 08/13/2019 01/31/2017 01/30/2016 01/27/2015  Does Patient Have a Medical Advance Directive? No Yes No No  Does patient want to make changes to medical advance directive? - Yes (MAU/Ambulatory/Procedural Areas - Information given) - -    Current Medications (verified) Outpatient Encounter Medications as of 08/13/2019  Medication Sig  . aspirin 81 MG tablet Take 81 mg by mouth daily.  [name]. atorvastatin (LIPITOR) 10 MG tablet Take one tablet (10 MG) by mouth every other day.  . fluticasone (FLONASE) 50 MCG/ACT nasal spray SPRAY 2 SPRAYS INTO EACH NOSTRIL ONCE A  DAY  . loratadine (CLARITIN) 10 MG tablet Take 10 mg by mouth daily.  [name]. losartan (COZAAR) 50 MG tablet Take 0.5 tablets (25 mg total) by mouth daily.  . meclizine (ANTIVERT) 25 MG tablet Take 1 tablet (25 mg total) by mouth 3 (three) times daily as needed for dizziness.  . meloxicam (MOBIC) 15 MG tablet Take 15 mg by mouth as needed for pain.  [name]. nystatin cream (MYCOSTATIN) Apply 1 application topically as needed for dry skin (twice a day as needed to groin area for itching). (Patient not taking: Reported on 08/07/2019)   No facility-administered encounter medications on file as of 08/13/2019.    Allergies (verified) Patient has no known allergies.   History: Past Medical History:  Diagnosis Date  . Alkaline phosphatase elevation   . Allergy   . Eczema   . GERD (gastroesophageal reflux disease)   . Heart murmur   . Hypertension   . Muscle cramps   . Osteoarthritis   . Osteoporosis    Past Surgical History:  Procedure Laterality Date  . JOINT REPLACEMENT Right 2013   hip   Family History  Problem Relation Age of Onset  . Cancer Mother   . CAD Mother   . Hypertension Mother   . Cancer Father        throat and lung  . Hypertension Brother   . Hypertension Daughter   . Hypertension Son   . Hypertension Brother   . Hypertension Brother   . Breast cancer Neg Hx    Social History   Socioeconomic History  . Marital status: Married    Spouse name: Not on  file  . Number of children: Not on file  . Years of education: 21  . Highest education level: 12th grade  Occupational History  . Occupation: retired  Tobacco Use  . Smoking status: Never Smoker  . Smokeless tobacco: Never Used  Vaping Use  . Vaping Use: Never used  Substance and Sexual Activity  . Alcohol use: No    Alcohol/week: 0.0 standard drinks  . Drug use: No  . Sexual activity: Yes  Other Topics Concern  . Not on file  Social History Narrative  . Not on file   Social Determinants of Health    Financial Resource Strain: Low Risk   . Difficulty of Paying Living Expenses: Not hard at all  Food Insecurity: No Food Insecurity  . Worried About Programme researcher, broadcasting/film/video in the Last Year: Never true  . Ran Out of Food in the Last Year: Never true  Transportation Needs: No Transportation Needs  . Lack of Transportation (Medical): No  . Lack of Transportation (Non-Medical): No  Physical Activity: Inactive  . Days of Exercise per Week: 0 days  . Minutes of Exercise per Session: 0 min  Stress: No Stress Concern Present  . Feeling of Stress : Not at all  Social Connections: Socially Integrated  . Frequency of Communication with Friends and Family: More than three times a week  . Frequency of Social Gatherings with Friends and Family: More than three times a week  . Attends Religious Services: More than 4 times per year  . Active Member of Clubs or Organizations: Yes  . Attends Banker Meetings: More than 4 times per year  . Marital Status: Married    Tobacco Counseling Counseling given: Not Answered   Clinical Intake:  Pre-visit preparation completed: Yes  Pain : No/denies pain     Nutritional Status: BMI 25 -29 Overweight Nutritional Risks: None Diabetes: No  How often do you need to have someone help you when you read instructions, pamphlets, or other written materials from your doctor or pharmacy?: 1 - Never What is the last grade level you completed in school?: 12th grade  Diabetic? no  Interpreter Needed?: No  Information entered by :: NAllen LPN   Activities of Daily Living In your present state of health, do you have any difficulty performing the following activities: 08/13/2019 01/02/2019  Hearing? N N  Vision? Y N  Comment can't see at night, having cataract removed -  Difficulty concentrating or making decisions? N N  Walking or climbing stairs? N N  Dressing or bathing? N N  Doing errands, shopping? N N  Preparing Food and eating ? N -   Using the Toilet? N -  In the past six months, have you accidently leaked urine? N -  Do you have problems with loss of bowel control? N -  Managing your Medications? N -  Managing your Finances? N -  Housekeeping or managing your Housekeeping? N -  Some recent data might be hidden    Patient Care Team: Marjie Skiff, NP as PCP - General (Nurse Practitioner) Donato Heinz, MD (Orthopedic Surgery) Marlowe Sax, RN as Case Manager (General Practice)  Indicate any recent Medical Services you may have received from other than [facility] providers in the past year (date may be approximate).     Assessment:   This is a routine wellness examination for Carol [name].  Hearing/Vision screen  Hearing Screening   125Hz  250Hz  500Hz  1000Hz  2000Hz  3000Hz  4000Hz  6000Hz  8000Hz   Right ear:           Left ear:           Vision Screening Comments: Regular eye exams, Dr.Nice  Dietary issues and exercise activities discussed: Current Exercise Habits: The patient does not participate in regular exercise at present  Goals    .  DIET - INCREASE WATER INTAKE      Recommend drinking at least 6-8 glasses of water a day     .  Patient Stated      08/13/2019, wants to exercise more    .  RNCM: "Hospice has come in to help [name]" (pt-stated)      CARE PLAN ENTRY (see longitudinal plan of care for additional care plan information)  Current Barriers:  [name] Knowledge Deficits related to stress and depression over the patients husbands  recent diagnosis of cancer with recommendations of comfort care  . Care Coordination needs related to nutritional resources and help with utilities  in a patient with HTN/HLD and stress due to husbands terminal illness (disease states) . Lacks caregiver support.  . Corporate treasurer.   Nurse Case Manager Clinical Goal(s):  [name] Over the next 120 days, patient will work with RNCM, pcp and CCM team to address needs related to stress reduction and needs in the home due to the  recent terminal diagnosis of her husband  . Over the next 120 days, patient will attend all scheduled medical appointments: next appointment with pcp on 07/13/2019 . Over the next 120 days, patient will work with CM clinical social worker to help with coping and grief of husbands terminal diagnosis and other needs related to financial constraints . Over the next 120 days, patient will work with care guides (community agency) to assist with food resources such as [employer] and resources to help with the high utility bills.   Interventions:  . Inter-disciplinary care team collaboration (see longitudinal plan of care) . Evaluation of current treatment plan related to stress/anxiety/depression and patient's adherence to plan as established by provider. . Advised patient to call the pcp and CCM team for questions or concerns . Provided education to patient re: community resources to help with food and other resources and to expect a call from the care guide team.  . Reviewed scheduled/upcoming provider appointments including: upcoming appointment on 07-13-2019 with pcp . Care Guide referral for help with food resources and high utility bill . Social Work referral for coping/grief mechanisms related to new terminal illness diagnosis in the patients husband with recommendations for comfort care. Hospice is coming into the home.   Patient Self Care Activities:  . Patient verbalizes understanding of plan to work with the CCM team to meet her health and wellness needs  . Attends all scheduled provider appointments . Calls provider office for new concerns or questions . Unable to independently manage stress/anxiety/depression  Initial goal documentation     .  RNCM: Pt-"I am not checking my blood pressure at home it freaks me out" (pt-stated)      CARE PLAN ENTRY (see longtitudinal plan of care for additional care plan information)  Current Barriers:  . Chronic Disease Management support,  education, and care coordination needs related to HTN and HLD  Clinical Goal(s) related to HTN and HLD:  Over the next 120 days, patient will:  . Work with the care management team to address educational, disease management, and care coordination needs  . Begin or continue self health monitoring activities as directed today  adhere to a heart healthy diet and monitor blood pressure if instructed by pcp . Call provider office for new or worsened signs and symptoms New or worsened symptom related to HTN/HLD . Call care management team with questions or concerns . Verbalize basic understanding of patient centered plan of care established today  Interventions related to HTN and HLD:  . Evaluation of current treatment plans and patient's adherence to plan as established by provider . Assessed patient understanding of disease states . Assessed patient's education and care coordination needs . Provided disease specific education to patient  . Collaborated with appropriate clinical care team members regarding patient needs  Patient Self Care Activities related to HTN and HLD:  . Patient is unable to independently self-manage chronic health conditions  Initial goal documentation       Depression Screen PHQ 2/9 Scores 08/13/2019 07/09/2019 01/02/2019 12/14/2017 01/31/2017 01/30/2016 01/27/2015  PHQ - 2 Score 0 1 0 1 0 0 0  PHQ- 9 Score - - 0 1 - 0 -    Fall Risk Fall Risk  08/13/2019 01/02/2019 04/28/2018 12/14/2017 01/31/2017  Falls in the past year? 0 0 0 Yes Yes  Number falls in past yr: - 0 - 2 or more 1  Injury with Fall? - 0 - No No  Risk for fall due to : Medication side effect - - - -  Follow up Falls evaluation completed;Education provided;Falls prevention discussed - Falls evaluation completed Education provided;Falls prevention discussed Falls prevention discussed    Any stairs in or around the home? Yes  If so, are there any without handrails? Yes  Home free of loose throw rugs  in walkways, pet beds, electrical cords, etc? Yes  Adequate lighting in your home to reduce risk of falls? Yes   ASSISTIVE DEVICES UTILIZED TO PREVENT FALLS:  Life alert? No  Use of a cane, walker or w/c? No  Grab bars in the bathroom? Yes  Shower chair or bench in shower? Yes  Elevated toilet seat or a handicapped toilet? No   TIMED UP AND GO:  Was the test performed? No .  .     Cognitive Function:     6CIT Screen 08/13/2019 01/31/2017  What Year? 0 points 0 points  What month? 0 points 0 points  What time? 0 points 0 points  Count back from 20 0 points 0 points  Months in reverse 4 points 0 points  Repeat phrase 0 points 0 points  Total Score 4 0    Immunizations Immunization History  Administered Date(s) Administered  . Fluad Quad(high Dose 65+) 01/02/2019  . Influenza, High Dose Seasonal PF 12/21/2017  . Influenza,inj,Quad PF,6+ Mos 01/27/2015, 02/13/2016  . Janssen (J&J) SARS-COV-2 Vaccination 05/04/2019  . Td 11/17/2004    TDAP status: Due, Education has been provided regarding the importance of this vaccine. Advised may receive this vaccine at local pharmacy or Health Dept. Aware to provide a copy of the vaccination record if obtained from local pharmacy or Health Dept. Verbalized acceptance and understanding. Flu Vaccine status: Up to date Pneumococcal vaccine status: Declined,  Education has been provided regarding the importance of this vaccine but patient still declined. Advised may receive this vaccine at local pharmacy or Health Dept. Aware to provide a copy of the vaccination record if obtained from local pharmacy or Health Dept. Verbalized acceptance and understanding.  Covid-19 vaccine status: Completed vaccines  Qualifies for Shingles Vaccine? Yes   Zostavax completed No   Shingrix Completed?: No.  Education has been provided regarding the importance of this vaccine. Patient has been advised to call insurance company to determine out of pocket  expense if they have not yet received this vaccine. Advised may also receive vaccine at local pharmacy or Health Dept. Verbalized acceptance and understanding.  Screening Tests Health Maintenance  Topic Date Due  . TETANUS/TDAP  09/26/2019 (Originally 11/18/2014)  . PNA vac Low Risk Adult (1 of 2 - PCV13) 09/26/2019 (Originally 05/24/2016)  . COLONOSCOPY  01/02/2020 (Originally 05/24/2001)  . INFLUENZA VACCINE  09/16/2019  . MAMMOGRAM  02/13/2021  . DEXA SCAN  Completed  . COVID-19 Vaccine  Completed  . Hepatitis C Screening  Completed    Health Maintenance  There are no preventive care reminders to display for this patient.  Colorectal cancer screening: Completed gets cologuard. Repeat every 3 years Mammogram status: Completed 02/14/2019. Repeat every year Bone Density status: Completed 05/02/2018.   Lung Cancer Screening: (Low Dose CT Chest recommended if Age 62-80 years, 30 pack-year currently smoking OR have quit w/in 15years.) does not qualify.   Lung Cancer Screening Referral: no  Additional Screening:  Hepatitis C Screening: does qualify; Completed 01/27/2015  Vision Screening: Recommended annual ophthalmology exams for early detection of glaucoma and other disorders of the eye. Is the patient up to date with their annual eye exam?  Yes  Who is the provider or what is the name of the office in which the patient attends annual eye exams? Dr. Larence Penning If pt is not established with a provider, would they like to be referred to a provider to establish care? No .   Dental Screening: Recommended annual dental exams for proper oral hygiene  Community Resource Referral / Chronic Care Management: CRR required this visit?  No   CCM required this visit?  No      Plan:     I have personally reviewed and noted the following in the patient's chart:   . Medical and social history . Use of alcohol, tobacco or illicit drugs  . Current medications and supplements . Functional ability  and status . Nutritional status . Physical activity . Advanced directives . List of other physicians . Hospitalizations, surgeries, and ER visits in previous 12 months . Vitals . Screenings to include cognitive, depression, and falls . Referrals and appointments  In addition, I have reviewed and discussed with patient certain preventive protocols, quality metrics, and best practice recommendations. A written personalized care plan for preventive services as well as general preventive health recommendations were provided to patient.   Due to this being a telephonic visit, the after visit summary with patients personalized plan  Patient preferred to pick up at office at next visit.   Barb Merino, LPN   [id number]   Nurse Notes:

## 2019-08-14 ENCOUNTER — Other Ambulatory Visit: Payer: Medicare Other | Attending: Ophthalmology

## 2019-08-14 NOTE — Discharge Instructions (Signed)

## 2019-08-16 ENCOUNTER — Ambulatory Visit: Payer: Medicare Other | Admitting: Anesthesiology

## 2019-08-16 ENCOUNTER — Encounter: Payer: Self-pay | Admitting: Ophthalmology

## 2019-08-16 ENCOUNTER — Ambulatory Visit
Admission: RE | Admit: 2019-08-16 | Discharge: 2019-08-16 | Disposition: A | Discharge status: Home / Self Care | Payer: Medicare Other | Attending: Ophthalmology | Admitting: Ophthalmology

## 2019-08-16 ENCOUNTER — Encounter
Admission: RE | Disposition: A | Discharge status: Home / Self Care | Payer: Self-pay | Source: Home / Self Care | Attending: Ophthalmology

## 2019-08-16 DIAGNOSIS — E785 Hyperlipidemia, unspecified: Secondary | ICD-10-CM | POA: Diagnosis not present

## 2019-08-16 DIAGNOSIS — E7800 Pure hypercholesterolemia, unspecified: Secondary | ICD-10-CM | POA: Diagnosis not present

## 2019-08-16 DIAGNOSIS — M1990 Unspecified osteoarthritis, unspecified site: Secondary | ICD-10-CM | POA: Diagnosis not present

## 2019-08-16 DIAGNOSIS — Z96643 Presence of artificial hip joint, bilateral: Secondary | ICD-10-CM | POA: Insufficient documentation

## 2019-08-16 DIAGNOSIS — H2511 Age-related nuclear cataract, right eye: Secondary | ICD-10-CM | POA: Diagnosis present

## 2019-08-16 DIAGNOSIS — K219 Gastro-esophageal reflux disease without esophagitis: Secondary | ICD-10-CM | POA: Insufficient documentation

## 2019-08-16 DIAGNOSIS — R011 Cardiac murmur, unspecified: Secondary | ICD-10-CM | POA: Diagnosis not present

## 2019-08-16 DIAGNOSIS — I10 Essential (primary) hypertension: Secondary | ICD-10-CM | POA: Diagnosis not present

## 2019-08-16 HISTORY — DX: Gastro-esophageal reflux disease without esophagitis: K21.9

## 2019-08-16 HISTORY — PX: CATARACT EXTRACTION W/PHACO: SHX586

## 2019-08-16 HISTORY — DX: Cardiac murmur, unspecified: R01.1

## 2019-08-16 SURGERY — PHACOEMULSIFICATION, CATARACT, WITH IOL INSERTION
Anesthesia: Monitor Anesthesia Care | Site: Eye | Laterality: Right

## 2019-08-16 MED ORDER — FENTANYL CITRATE (PF) 100 MCG/2ML IJ SOLN
INTRAMUSCULAR | Status: DC | PRN
  Administered 2019-08-16: 50 ug via INTRAVENOUS

## 2019-08-16 MED ORDER — TRYPAN BLUE 0.06 % OP SOLN
OPHTHALMIC | Status: DC | PRN
  Administered 2019-08-16: 0.5 mL via INTRAOCULAR

## 2019-08-16 MED ORDER — ACETAMINOPHEN 160 MG/5ML PO SOLN: 325.0000 mg | ORAL | Status: DC | PRN

## 2019-08-16 MED ORDER — LIDOCAINE HCL (PF) 2 % IJ SOLN
INTRAOCULAR | Status: DC | PRN
  Administered 2019-08-16: 1 mL via INTRAOCULAR

## 2019-08-16 MED ORDER — NA CHONDROIT SULF-NA HYALURON 40-17 MG/ML IO SOLN
INTRAOCULAR | Status: DC | PRN
  Administered 2019-08-16: 1 mL via INTRAOCULAR

## 2019-08-16 MED ORDER — MOXIFLOXACIN HCL 0.5 % OP SOLN
OPHTHALMIC | Status: DC | PRN
  Administered 2019-08-16: 0.2 mL via OPHTHALMIC

## 2019-08-16 MED ORDER — MIDAZOLAM HCL 2 MG/2ML IJ SOLN
INTRAMUSCULAR | Status: DC | PRN
  Administered 2019-08-16: 1 mg via INTRAVENOUS

## 2019-08-16 MED ORDER — ACETAMINOPHEN 325 MG PO TABS: 325.0000 mg | ORAL_TABLET | ORAL | Status: DC | PRN

## 2019-08-16 MED ORDER — TETRACAINE 0.5 % OP SOLN OPTIME - NO CHARGE
OPHTHALMIC | Status: DC | PRN
  Administered 2019-08-16: 1 [drp] via OPHTHALMIC

## 2019-08-16 MED ORDER — ONDANSETRON HCL 4 MG/2ML IJ SOLN: 4.0000 mg | Freq: Once | INTRAMUSCULAR | Status: DC | PRN

## 2019-08-16 MED ORDER — EPINEPHRINE PF 1 MG/ML IJ SOLN
INTRAOCULAR | Status: DC | PRN
  Administered 2019-08-16: 64 mL via OPHTHALMIC

## 2019-08-16 MED ORDER — BRIMONIDINE TARTRATE-TIMOLOL 0.2-0.5 % OP SOLN
OPHTHALMIC | Status: DC | PRN
  Administered 2019-08-16: 1 [drp] via OPHTHALMIC

## 2019-08-16 MED ORDER — PROVISC 10 MG/ML IO SOLN
INTRAOCULAR | Status: DC | PRN
  Administered 2019-08-16: 0.55 mL via INTRAOCULAR

## 2019-08-16 MED ORDER — TETRACAINE HCL 0.5 % OP SOLN
1.0000 [drp] | OPHTHALMIC | Status: DC | PRN
  Administered 2019-08-16 (×3): 1 [drp] via OPHTHALMIC

## 2019-08-16 MED ORDER — ARMC OPHTHALMIC DILATING DROPS
1.0000 "application " | OPHTHALMIC | Status: DC | PRN
  Administered 2019-08-16 (×3): 1 via OPHTHALMIC

## 2019-08-16 SURGICAL SUPPLY — 20 items
DISSECTOR HYDRO NUCLEUS 50X22 (MISCELLANEOUS) ×12 IMPLANT
DRSG TEGADERM 2-3/8X2-3/4 SM (GAUZE/BANDAGES/DRESSINGS) ×3 IMPLANT
GLOVE BIOGEL PI IND STRL 8 (GLOVE) ×1 IMPLANT
GLOVE BIOGEL PI INDICATOR 8 (GLOVE) ×2
GOWN STRL REUS W/ TWL LRG LVL3 (GOWN DISPOSABLE) ×1 IMPLANT
GOWN STRL REUS W/ TWL XL LVL3 (GOWN DISPOSABLE) ×1 IMPLANT
GOWN STRL REUS W/TWL LRG LVL3 (GOWN DISPOSABLE) ×3
GOWN STRL REUS W/TWL XL LVL3 (GOWN DISPOSABLE) ×3
KNIFE 45D UP 2.3 (MISCELLANEOUS) ×3 IMPLANT
LENS IOL DIOP 19.5 (Intraocular Lens) ×3 IMPLANT
LENS IOL TECNIS MONO 19.5 (Intraocular Lens) IMPLANT
MARKER SKIN DUAL TIP RULER LAB (MISCELLANEOUS) ×3 IMPLANT
NDL CAPSULORHEX 25GA (NEEDLE) ×1 IMPLANT
NEEDLE CAPSULORHEX 25GA (NEEDLE) ×3 IMPLANT
PACK CATARACT (MISCELLANEOUS) ×3 IMPLANT
PACK DR. KING ARMS (PACKS) ×3 IMPLANT
PACK EYE AFTER SURG (MISCELLANEOUS) ×3 IMPLANT
SOLUTION OPHTHALMIC SALT (MISCELLANEOUS) ×3 IMPLANT
WATER STERILE IRR 250ML POUR (IV SOLUTION) ×3 IMPLANT
WIPE NON LINTING 3.25X3.25 (MISCELLANEOUS) ×3 IMPLANT

## 2019-08-16 NOTE — Anesthesia Procedure Notes (Signed)
Procedure Name: MAC Date/Time: 08/16/2019 11:17 AM Performed by: Silvana Newness, CRNA Pre-anesthesia Checklist: Patient identified, Emergency Drugs available, Suction available and Patient being monitored Patient Re-evaluated:Patient Re-evaluated prior to induction Oxygen Delivery Method: Nasal cannula

## 2019-08-16 NOTE — Anesthesia Postprocedure Evaluation (Signed)
Anesthesia Post Note  Patient: Carol [name]  Procedure(s) Performed: CATARACT EXTRACTION PHACO AND INTRAOCULAR LENS PLACEMENT (IOC) RIGHT VISION BLUE 5.18  00:36.0 (Right Eye)     Patient location during evaluation: PACU Anesthesia Type: MAC Level of consciousness: awake Pain management: pain level controlled Vital Signs Assessment: post-procedure vital signs reviewed and stable Respiratory status: respiratory function stable Cardiovascular status: stable Postop Assessment: no apparent nausea or vomiting Anesthetic complications: no   No complications documented.  Veda Canning

## 2019-08-16 NOTE — H&P (Signed)
   I have reviewed the patient's H&P and agree with its findings. There have been no interval changes.  Jasaiah Karwowski MD Ophthalmology 

## 2019-08-16 NOTE — Op Note (Signed)
  PREOPERATIVE DIAGNOSIS:  Nuclear sclerotic cataract of the RIGHT eye.   POSTOPERATIVE DIAGNOSIS:  Nuclear sclerotic cataract of the RIGHT eye.   OPERATIVE PROCEDURE: Cataract surgery OD   SURGEON:  Carol Cousin, MD.   ANESTHESIA:  Anesthesiologist: Jola Babinski, MD CRNA: Michaele Offer, CRNA  1.      Managed anesthesia care. 2.     0.65ml of Shugarcaine was instilled following the paracentesis   COMPLICATIONS:  None.   TECHNIQUE:   Divide and conquer   DESCRIPTION OF PROCEDURE:  The patient was examined and consented in the preoperative holding area where the aforementioned topical anesthesia was applied to the RIGHT eye and then brought back to the Operating Room where the RIGHT eye was prepped and draped in the usual sterile ophthalmic fashion and a lid speculum was placed. A paracentesis was created with the side port blade, the anterior chamber was washed out with trypan blue to stain the anterior capsule, and the anterior chamber was filled with viscoelastic. A near clear corneal incision was performed with the steel keratome. A continuous curvilinear capsulorrhexis was performed with a cystotome followed by the capsulorrhexis forceps. Hydrodissection and hydrodelineation were carried out with BSS on a blunt cannula. The lens was removed in a divide and conquer  technique and the remaining cortical material was removed with the irrigation-aspiration handpiece. The capsular bag was inflated with viscoelastic and the lens was placed in the capsular bag without complication. The remaining viscoelastic was removed from the eye with the irrigation-aspiration handpiece. The wounds were hydrated. The anterior chamber was flushed and the eye was inflated to physiologic pressure. 0.30ml Vigamox was placed in the anterior chamber. The wounds were found to be water tight. The eye was dressed with Vigamox. The patient was given protective glasses to wear throughout the day and a shield with which to  sleep tonight. The patient was also given drops with which to begin a drop regimen today and will follow-up with me in one day. Implant Name Type Inv. Item Serial No. Manufacturer Lot No. LRB No. Used Action  LENS IOL DIOP 19.5 - [id number] Intraocular Lens LENS IOL DIOP 19.5 [id number] [employer]  Right 1 Implanted    Procedure(s): CATARACT EXTRACTION PHACO AND INTRAOCULAR LENS PLACEMENT (IOC) RIGHT VISION BLUE 5.18  00:36.0 (Right)  Electronically signed: Elliot [name] 08/16/2019 11:51 AM

## 2019-08-16 NOTE — Anesthesia Preprocedure Evaluation (Addendum)
Anesthesia Evaluation  Patient identified by MRN, date of birth, ID band Patient awake    Reviewed: Allergy & Precautions, NPO status   Airway Mallampati: II  TM Distance: >3 FB     Dental   Pulmonary neg pulmonary ROS,    breath sounds clear to auscultation       Cardiovascular hypertension,  Rhythm:Regular Rate:Normal  HLD   Neuro/Psych    GI/Hepatic GERD  ,  Endo/Other    Renal/GU      Musculoskeletal  (+) Arthritis ,   Abdominal   Peds  Hematology   Anesthesia Other Findings   Reproductive/Obstetrics                             Anesthesia Physical Anesthesia Plan  ASA: II  Anesthesia Plan: MAC   Post-op Pain Management:    Induction: Intravenous  PONV Risk Score and Plan: TIVA, Midazolam and Treatment may vary due to age or medical condition  Airway Management Planned: Natural Airway and Nasal Cannula  Additional Equipment:   Intra-op Plan:   Post-operative Plan:   Informed Consent: I have reviewed the patients History and Physical, chart, labs and discussed the procedure including the risks, benefits and alternatives for the proposed anesthesia with the patient or authorized representative who has indicated his/her understanding and acceptance.       Plan Discussed with: CRNA  Anesthesia Plan Comments:         Anesthesia Quick Evaluation

## 2019-08-16 NOTE — Transfer of Care (Signed)
Immediate Anesthesia Transfer of Care Note  Patient: Carol [name]  Procedure(s) Performed: CATARACT EXTRACTION PHACO AND INTRAOCULAR LENS PLACEMENT (IOC) RIGHT VISION BLUE 5.18  00:36.0 (Right Eye)  Patient Location: PACU  Anesthesia Type: MAC  Level of Consciousness: awake, alert  and patient cooperative  Airway and Oxygen Therapy: Patient Spontanous Breathing and Patient connected to supplemental oxygen  Post-op Assessment: Post-op Vital signs reviewed, Patient's Cardiovascular Status Stable, Respiratory Function Stable, Patent Airway and No signs of Nausea or vomiting  Post-op Vital Signs: Reviewed and stable  Complications: No complications documented.

## 2019-08-17 ENCOUNTER — Encounter: Payer: Self-pay | Admitting: Ophthalmology

## 2019-08-21 ENCOUNTER — Telehealth: Payer: Self-pay | Admitting: General Practice

## 2019-08-21 ENCOUNTER — Ambulatory Visit (INDEPENDENT_AMBULATORY_CARE_PROVIDER_SITE_OTHER): Payer: Medicare Other | Admitting: General Practice

## 2019-08-21 DIAGNOSIS — E782 Mixed hyperlipidemia: Secondary | ICD-10-CM | POA: Diagnosis not present

## 2019-08-21 DIAGNOSIS — I10 Essential (primary) hypertension: Secondary | ICD-10-CM | POA: Diagnosis not present

## 2019-08-21 DIAGNOSIS — F419 Anxiety disorder, unspecified: Secondary | ICD-10-CM

## 2019-08-21 DIAGNOSIS — F439 Reaction to severe stress, unspecified: Secondary | ICD-10-CM

## 2019-08-21 NOTE — Patient Instructions (Signed)
Visit Information  Goals Addressed              This Visit's Progress   .  RNCM: "Hospice has come in to help Carol [name]" (pt-stated)        CARE PLAN ENTRY (see longitudinal plan of care for additional care plan information)  Current Barriers:  [name] Knowledge Deficits related to stress and depression over the patients husbands  recent diagnosis of cancer with recommendations of comfort care  . Care Coordination needs related to nutritional resources and help with utilities  in a patient with HTN/HLD and stress due to husbands terminal illness (disease states) . Lacks caregiver support.  . Corporate treasurer.   Nurse Case Manager Clinical Goal(s):  [name] Over the next 120 days, patient will work with RNCM, pcp and CCM team to address needs related to stress reduction and needs in the home due to the recent terminal diagnosis of her husband  . Over the next 120 days, patient will attend all scheduled medical appointments: next appointment with pcp on 07/13/2019 . Over the next 120 days, patient will work with CM clinical social worker to help with coping and grief of husbands terminal diagnosis and other needs related to financial constraints . Over the next 120 days, patient will work with care guides (community agency) to assist with food resources such as [employer] and resources to help with the high utility bills.   Interventions:  . Inter-disciplinary care team collaboration (see longitudinal plan of care) . Evaluation of current treatment plan related to stress/anxiety/depression and patient's adherence to plan as established by provider.  The patient states that she is maintaining her health and well being. The nurse was out today to see Carol [name] and he was having a good day. Education on the importance of self care when taking care of spouse.  . Advised patient to call the pcp and CCM team for questions or concerns . Provided education to patient re: community resources to help  with food and other resources and to expect a call from the care guide team. Education on referral to the care guides for help with food resources.  . Reviewed scheduled/upcoming provider appointments including: upcoming appointment on 08-31-2019 with pcp . Care Guide referral for help with food resources and high utility bill . Social Work referral for coping/grief mechanisms related to new terminal illness diagnosis in the patients husband with recommendations for comfort care. Hospice is coming into the home.   Patient Self Care Activities:  . Patient verbalizes understanding of plan to work with the CCM team to meet her health and wellness needs  . Attends all scheduled provider appointments . Calls provider office for new concerns or questions . Unable to independently manage stress/anxiety/depression  Please see past updates related to this goal by clicking on the "Past Updates" button in the selected goal      .  RNCM: Pt-"I am not checking my blood pressure at home it freaks me out" (pt-stated)        CARE PLAN ENTRY (see longtitudinal plan of care for additional care plan information)  Current Barriers:  . Chronic Disease Management support, education, and care coordination needs related to HTN and HLD  Clinical Goal(s) related to HTN and HLD:  Over the next 120 days, patient will:  . Work with the care management team to address educational, disease management, and care coordination needs  . Begin or continue self health monitoring activities as directed today  adhere to  a heart healthy diet and monitor blood pressure if instructed by pcp . Call provider office for new or worsened signs and symptoms New or worsened symptom related to HTN/HLD . Call care management team with questions or concerns . Verbalize basic understanding of patient centered plan of care established today  Interventions related to HTN and HLD:  . Evaluation of current treatment plans and patient's adherence  to plan as established by provider.  Has upcoming appointments to see pcp. Next appointment on 08-31-2019.  . Assessed patient understanding of disease states.  The patient verbalized understanding of her conditions. Discussed taking care of self and needs.  The patient has stressors related to being the caregiver of her elderly husband. The patient had recent cataract surgery on 08-16-2019 and is doing well.  . Assessed patient's education and care coordination needs.  The patient expressed that they did not qualify for food stamps. The patient is interested on [employer] or food resources in the area. Will do a care guide referral for food resources.  . Provided disease specific education to patient.  Education on taking blood pressures and recording. The patient states her blood pressure was good during her procedure because if it had been high she would not have been able to have to procedure. The patient will try to take it at home and record.  Carol [name] with appropriate clinical care team members regarding patient needs.  The patient knows about the LCSW and Pharm D on the team. The patient denies any other needs. Agrees to speak to care guides about food resources. . Evaluation of upcoming appointments. Sees pcp on 08-31-2019 and will have other cataract removed in about 2 weeks.   Patient Self Care Activities related to HTN and HLD:  . Patient is unable to independently self-manage chronic health conditions  Please see past updates related to this goal by clicking on the "Past Updates" button in the selected goal         Patient verbalizes understanding of instructions provided today.   The care management team will reach out to the patient again over the next 30 to 60 days.   Carol Denver RN, MSN, CCM Community Care Coordinator [place]  [facility] Mobile: [phone]

## 2019-08-21 NOTE — Chronic Care Management (AMB) (Signed)
Chronic Care Management   Follow Up Note   08/21/2019 Name: Carol [name] MRN: [id number] DOB: 1950/03/01  Referred by: Marjie Skiff, NP Reason for referral : Chronic Care Management (Follow up: RNCM Chronic Disease Management and Care Coordination Needs)   Carol [name] is a 69 y.o. year old female who is a primary care patient of Cannady, Dorie Rank, NP. The CCM team was consulted for assistance with chronic disease management and care coordination needs.    Review of patient status, including review of consultants reports, relevant laboratory and other test results, and collaboration with appropriate care team members and the patient's provider was performed as part of comprehensive patient evaluation and provision of chronic care management services.    SDOH (Social Determinants of Health) assessments performed: Yes See Care Plan activities for detailed interventions related to [facility])     Outpatient Encounter Medications as of 08/21/2019  Medication Sig   aspirin 81 MG tablet Take 81 mg by mouth daily.   atorvastatin (LIPITOR) 10 MG tablet Take one tablet (10 MG) by mouth every other day.   fluticasone (FLONASE) 50 MCG/ACT nasal spray SPRAY 2 SPRAYS INTO EACH NOSTRIL ONCE A DAY   loratadine (CLARITIN) 10 MG tablet Take 10 mg by mouth daily.   losartan (COZAAR) 50 MG tablet Take 0.5 tablets (25 mg total) by mouth daily.   meclizine (ANTIVERT) 25 MG tablet Take 1 tablet (25 mg total) by mouth 3 (three) times daily as needed for dizziness.   meloxicam (MOBIC) 15 MG tablet Take 15 mg by mouth as needed for pain.   nystatin cream (MYCOSTATIN) Apply 1 application topically as needed for dry skin (twice a day as needed to groin area for itching). (Patient not taking: Reported on 08/07/2019)   No facility-administered encounter medications on file as of 08/21/2019.     Objective:  BP Readings from Last 3 Encounters:  08/16/19 (!) 145/92  07/13/19 128/64  06/05/19 130/72     Goals Addressed              This Visit's Progress     RNCM: "Hospice has come in to help [name]" (pt-stated)        CARE PLAN ENTRY (see longitudinal plan of care for additional care plan information)  Current Barriers:   Knowledge Deficits related to stress and depression over the patients husbands  recent diagnosis of cancer with recommendations of comfort care   Care Coordination needs related to nutritional resources and help with utilities  in a patient with HTN/HLD and stress due to husbands terminal illness (disease states)  Lacks caregiver support.   Corporate treasurer.   Nurse Case Manager Clinical Goal(s):   Over the next 120 days, patient will work with RNCM, pcp and CCM team to address needs related to stress reduction and needs in the home due to the recent terminal diagnosis of her husband   Over the next 120 days, patient will attend all scheduled medical appointments: next appointment with pcp on 07/13/2019  Over the next 120 days, patient will work with CM clinical social worker to help with coping and grief of husbands terminal diagnosis and other needs related to financial constraints  Over the next 120 days, patient will work with care guides (community agency) to assist with food resources such as [employer] and resources to help with the high utility bills.   Interventions:   Inter-disciplinary care team collaboration (see longitudinal plan of care)  Evaluation of current treatment  plan related to stress/anxiety/depression and patient's adherence to plan as established by provider.  The patient states that she is maintaining her health and well being. The nurse was out today to see Derryl Harbor and he was having a good day. Education on the importance of self care when taking care of spouse.   Advised patient to call the pcp and CCM team for questions or concerns  Provided education to patient re: community resources to help with food and  other resources and to expect a call from the care guide team. Education on referral to the care guides for help with food resources.   Reviewed scheduled/upcoming provider appointments including: upcoming appointment on 08-31-2019 with pcp  Care Guide referral for help with food resources and high utility bill  Social Work referral for coping/grief mechanisms related to new terminal illness diagnosis in the patients husband with recommendations for comfort care. Hospice is coming into the home.   Patient Self Care Activities:   Patient verbalizes understanding of plan to work with the CCM team to meet her health and wellness needs   Attends all scheduled provider appointments  Calls provider office for new concerns or questions  Unable to independently manage stress/anxiety/depression  Please see past updates related to this goal by clicking on the "Past Updates" button in the selected goal        RNCM: Pt-"I am not checking my blood pressure at home it freaks me out" (pt-stated)        CARE PLAN ENTRY (see longtitudinal plan of care for additional care plan information)  Current Barriers:   Chronic Disease Management support, education, and care coordination needs related to HTN and HLD  Clinical Goal(s) related to HTN and HLD:  Over the next 120 days, patient will:   Work with the care management team to address educational, disease management, and care coordination needs   Begin or continue self health monitoring activities as directed today  adhere to a heart healthy diet and monitor blood pressure if instructed by pcp  Call provider office for new or worsened signs and symptoms New or worsened symptom related to HTN/HLD  Call care management team with questions or concerns  Verbalize basic understanding of patient centered plan of care established today  Interventions related to HTN and HLD:   Evaluation of current treatment plans and patient's adherence to plan as  established by provider.  Has upcoming appointments to see pcp. Next appointment on 08-31-2019.   Assessed patient understanding of disease states.  The patient verbalized understanding of her conditions. Discussed taking care of self and needs.  The patient has stressors related to being the caregiver of her elderly husband. The patient had recent cataract surgery on 08-16-2019 and is doing well.   Assessed patient's education and care coordination needs.  The patient expressed that they did not qualify for food stamps. The patient is interested on [employer] or food resources in the area. Will do a care guide referral for food resources.   Provided disease specific education to patient.  Education on taking blood pressures and recording. The patient states her blood pressure was good during her procedure because if it had been high she would not have been able to have to procedure. The patient will try to take it at home and record.   Collaborated with appropriate clinical care team members regarding patient needs.  The patient knows about the LCSW and Pharm D on the team. The patient denies any other  needs. Agrees to speak to care guides about food resources.  Evaluation of upcoming appointments. Sees pcp on 08-31-2019 and will have other cataract removed in about 2 weeks.   Patient Self Care Activities related to HTN and HLD:   Patient is unable to independently self-manage chronic health conditions  Please see past updates related to this goal by clicking on the "Past Updates" button in the selected goal          Plan:   The care management team will reach out to the patient again over the next 30 to 60 days.    Alto Denver RN, MSN, CCM Community Care Coordinator [place]   [facility] Mobile: [phone]

## 2019-08-31 ENCOUNTER — Ambulatory Visit (INDEPENDENT_AMBULATORY_CARE_PROVIDER_SITE_OTHER): Payer: Medicare Other | Admitting: Nurse Practitioner

## 2019-08-31 ENCOUNTER — Encounter: Payer: Self-pay | Admitting: Nurse Practitioner

## 2019-08-31 ENCOUNTER — Other Ambulatory Visit: Payer: Self-pay

## 2019-08-31 DIAGNOSIS — I10 Essential (primary) hypertension: Secondary | ICD-10-CM

## 2019-08-31 DIAGNOSIS — E782 Mixed hyperlipidemia: Secondary | ICD-10-CM | POA: Diagnosis not present

## 2019-08-31 NOTE — Patient Instructions (Signed)
DASH Eating Plan DASH stands for "Dietary Approaches to Stop Hypertension." The DASH eating plan is a healthy eating plan that has been shown to reduce high blood pressure (hypertension). It may also reduce your risk for type 2 diabetes, heart disease, and stroke. The DASH eating plan may also help with weight loss. What are tips for following this plan?  General guidelines  Avoid eating more than 2,300 mg (milligrams) of salt (sodium) a day. If you have hypertension, you may need to reduce your sodium intake to 1,500 mg a day.  Limit alcohol intake to no more than 1 drink a day for nonpregnant women and 2 drinks a day for men. One drink equals 12 oz of beer, 5 oz of wine, or 1 oz of hard liquor.  Work with your health care provider to maintain a healthy body weight or to lose weight. Ask what an ideal weight is for you.  Get at least 30 minutes of exercise that causes your heart to beat faster (aerobic exercise) most days of the week. Activities may include walking, swimming, or biking.  Work with your health care provider or diet and nutrition specialist (dietitian) to adjust your eating plan to your individual calorie needs. Reading food labels   Check food labels for the amount of sodium per serving. Choose foods with less than 5 percent of the Daily Value of sodium. Generally, foods with less than 300 mg of sodium per serving fit into this eating plan.  To find whole grains, look for the word "whole" as the first word in the ingredient list. Shopping  Buy products labeled as "low-sodium" or "no salt added."  Buy fresh foods. Avoid canned foods and premade or frozen meals. Cooking  Avoid adding salt when cooking. Use salt-free seasonings or herbs instead of table salt or sea salt. Check with your health care provider or pharmacist before using salt substitutes.  Do not fry foods. Cook foods using healthy methods such as baking, boiling, grilling, and broiling instead.  Cook with  heart-healthy oils, such as olive, canola, soybean, or sunflower oil. Meal planning  Eat a balanced diet that includes: ? 5 or more servings of fruits and vegetables each day. At each meal, try to fill half of your plate with fruits and vegetables. ? Up to 6-8 servings of whole grains each day. ? Less than 6 oz of lean meat, poultry, or fish each day. A 3-oz serving of meat is about the same size as a deck of cards. One egg equals 1 oz. ? 2 servings of low-fat dairy each day. ? A serving of nuts, seeds, or beans 5 times each week. ? Heart-healthy fats. Healthy fats called Omega-3 fatty acids are found in foods such as flaxseeds and coldwater fish, like sardines, salmon, and mackerel.  Limit how much you eat of the following: ? Canned or prepackaged foods. ? Food that is high in trans fat, such as fried foods. ? Food that is high in saturated fat, such as fatty meat. ? Sweets, desserts, sugary drinks, and other foods with added sugar. ? Full-fat dairy products.  Do not salt foods before eating.  Try to eat at least 2 vegetarian meals each week.  Eat more home-cooked food and less restaurant, buffet, and fast food.  When eating at a restaurant, ask that your food be prepared with less salt or no salt, if possible. What foods are recommended? The items listed may not be a complete list. Talk with your dietitian about   what dietary choices are best for you. Grains Whole-grain or whole-wheat bread. Whole-grain or whole-wheat pasta. Brown rice. Oatmeal. Quinoa. Bulgur. Whole-grain and low-sodium cereals. Pita bread. Low-fat, low-sodium crackers. Whole-wheat flour tortillas. Vegetables Fresh or frozen vegetables (raw, steamed, roasted, or grilled). Low-sodium or reduced-sodium tomato and vegetable juice. Low-sodium or reduced-sodium tomato sauce and tomato paste. Low-sodium or reduced-sodium canned vegetables. Fruits All fresh, dried, or frozen fruit. Canned fruit in natural juice (without  added sugar). Meat and other protein foods Skinless chicken or turkey. Ground chicken or turkey. Pork with fat trimmed off. Fish and seafood. Egg whites. Dried beans, peas, or lentils. Unsalted nuts, nut butters, and seeds. Unsalted canned beans. Lean cuts of beef with fat trimmed off. Low-sodium, lean deli meat. Dairy Low-fat (1%) or fat-free (skim) milk. Fat-free, low-fat, or reduced-fat cheeses. Nonfat, low-sodium ricotta or cottage cheese. Low-fat or nonfat yogurt. Low-fat, low-sodium cheese. Fats and oils Soft margarine without trans fats. Vegetable oil. Low-fat, reduced-fat, or light mayonnaise and salad dressings (reduced-sodium). Canola, safflower, olive, soybean, and sunflower oils. Avocado. Seasoning and other foods Herbs. Spices. Seasoning mixes without salt. Unsalted popcorn and pretzels. Fat-free sweets. What foods are not recommended? The items listed may not be a complete list. Talk with your dietitian about what dietary choices are best for you. Grains Baked goods made with fat, such as croissants, muffins, or some breads. Dry pasta or rice meal packs. Vegetables Creamed or fried vegetables. Vegetables in a cheese sauce. Regular canned vegetables (not low-sodium or reduced-sodium). Regular canned tomato sauce and paste (not low-sodium or reduced-sodium). Regular tomato and vegetable juice (not low-sodium or reduced-sodium). Pickles. Olives. Fruits Canned fruit in a light or heavy syrup. Fried fruit. Fruit in cream or butter sauce. Meat and other protein foods Fatty cuts of meat. Ribs. Fried meat. Bacon. Sausage. Bologna and other processed lunch meats. Salami. Fatback. Hotdogs. Bratwurst. Salted nuts and seeds. Canned beans with added salt. Canned or smoked fish. Whole eggs or egg yolks. Chicken or turkey with skin. Dairy Whole or 2% milk, cream, and half-and-half. Whole or full-fat cream cheese. Whole-fat or sweetened yogurt. Full-fat cheese. Nondairy creamers. Whipped toppings.  Processed cheese and cheese spreads. Fats and oils Butter. Stick margarine. Lard. Shortening. Ghee. Bacon fat. Tropical oils, such as coconut, palm kernel, or palm oil. Seasoning and other foods Salted popcorn and pretzels. Onion salt, garlic salt, seasoned salt, table salt, and sea salt. Worcestershire sauce. Tartar sauce. Barbecue sauce. Teriyaki sauce. Soy sauce, including reduced-sodium. Steak sauce. Canned and packaged gravies. Fish sauce. Oyster sauce. Cocktail sauce. Horseradish that you find on the shelf. Ketchup. Mustard. Meat flavorings and tenderizers. Bouillon cubes. Hot sauce and Tabasco sauce. Premade or packaged marinades. Premade or packaged taco seasonings. Relishes. Regular salad dressings. Where to find more information:  National Heart, Lung, and Blood Institute: www.nhlbi.nih.gov  American Heart Association: www.heart.org Summary  The DASH eating plan is a healthy eating plan that has been shown to reduce high blood pressure (hypertension). It may also reduce your risk for type 2 diabetes, heart disease, and stroke.  With the DASH eating plan, you should limit salt (sodium) intake to 2,300 mg a day. If you have hypertension, you may need to reduce your sodium intake to 1,500 mg a day.  When on the DASH eating plan, aim to eat more fresh fruits and vegetables, whole grains, lean proteins, low-fat dairy, and heart-healthy fats.  Work with your health care provider or diet and nutrition specialist (dietitian) to adjust your eating plan to your   individual calorie needs. This information is not intended to replace advice given to you by your health care provider. Make sure you discuss any questions you have with your health care provider. Document Revised: 01/14/2017 Document Reviewed: 01/26/2016 Elsevier Patient Education  2020 Else[employer] Inc.  

## 2019-08-31 NOTE — Assessment & Plan Note (Signed)
Chronic, ongoing. BP in office today below goal.  She does have increased stressors lately and is not checking at home due to this.  Recommend she monitor at least a few times a week at home.  Continue Losartan 25 MG daily and adjust as needed -- she did not tolerate increase in this dose to 50 MG in past and had dizziness.   Focus on DASH diet at home.  Return to office in 7 weeks for BP check and reassurance.

## 2019-08-31 NOTE — Progress Notes (Signed)
BP 128/72 (BP Location: Left Arm)    Pulse 82    Temp 98.3 F (36.8 C) (Oral)    Wt 156 lb (70.8 kg)    LMP  (LMP Unknown)    SpO2 97%    BMI 27.63 kg/m    Subjective:    Patient ID: Carol [name], female    DOB: [birth year], 69 y.o.   MRN: [id number]  HPI: Carol [name] is a 69 y.o. female  Chief Complaint  Patient presents with   Hypertension   HYPERTENSION / HYPERLIPIDEMIA Continues on Losartan 25 MG daily.  Continues On Lipitor every other day. Has history of elevation in TSH, but recent levels have remained normal. Satisfied with current treatment? yes Duration of hypertension: chronic BP monitoring frequency: not checking BP range:  BP medication side effects: no Duration of hyperlipidemia: chronic Cholesterol medication side effects: no Cholesterol supplements: none Medication compliance: good compliance Aspirin: yes Recent stressors: no Recurrent headaches: no Visual changes: no Palpitations: no Dyspnea: no Chest pain: no Lower extremity edema: no Dizzy/lightheaded: no  Relevant past medical, surgical, family and social history reviewed and updated as indicated. Interim medical history since our last visit reviewed. Allergies and medications reviewed and updated.  Review of Systems  Constitutional: Negative for activity change, appetite change, diaphoresis, fatigue and fever.  Respiratory: Negative for cough, chest tightness and shortness of breath.   Cardiovascular: Negative for chest pain, palpitations and leg swelling.  Gastrointestinal: Negative.   Neurological: Negative.   Psychiatric/Behavioral: Negative.     Per HPI unless specifically indicated above     Objective:    BP 128/72 (BP Location: Left Arm)    Pulse 82    Temp 98.3 F (36.8 C) (Oral)    Wt 156 lb (70.8 kg)    LMP  (LMP Unknown)    SpO2 97%    BMI 27.63 kg/m   Wt Readings from Last 3 Encounters:  08/31/19 156 lb (70.8 kg)  08/16/19 156 lb (70.8 kg)  08/13/19 150 lb (68 kg)      Physical Exam Vitals and nursing note reviewed.  Constitutional:      General: She is awake. She is not in acute distress.    Appearance: She is well-developed and overweight. She is not ill-appearing.  HENT:     Head: Normocephalic.     Right Ear: Hearing normal.     Left Ear: Hearing normal.  Eyes:     General: Lids are normal.        Right eye: No discharge.        Left eye: No discharge.     Conjunctiva/sclera: Conjunctivae normal.     Pupils: Pupils are equal, round, and reactive to light.  Neck:     Vascular: No carotid bruit.  Cardiovascular:     Rate and Rhythm: Normal rate and regular rhythm.     Heart sounds: Normal heart sounds. No murmur heard.  No gallop.   Pulmonary:     Effort: Pulmonary effort is normal. No accessory muscle usage or respiratory distress.     Breath sounds: Normal breath sounds.  Abdominal:     General: Bowel sounds are normal.     Palpations: Abdomen is soft.  Musculoskeletal:     Cervical back: Normal range of motion and neck supple.     Right lower leg: No edema.     Left lower leg: No edema.  Skin:    General: Skin is warm and dry.  Neurological:  Mental Status: She is alert and oriented to person, place, and time.  Psychiatric:        Attention and Perception: Attention normal.        Mood and Affect: Mood normal.        Speech: Speech normal.        Behavior: Behavior normal. Behavior is cooperative.        Thought Content: Thought content normal.    Results for orders placed or performed in visit on 07/13/19  Lipid Panel w/o Chol/HDL Ratio  Result Value Ref Range   Cholesterol, Total 153 100 - 199 mg/dL   Triglycerides 644 0 - 149 mg/dL   HDL 47 >03 mg/dL   VLDL Cholesterol Cal 20 5 - 40 mg/dL   LDL Chol Calc (NIH) 86 0 - 99 mg/dL  Basic metabolic panel  Result Value Ref Range   Glucose 91 65 - 99 mg/dL   BUN 13 8 - 27 mg/dL   Creatinine, Ser 4.74 0.57 - 1.00 mg/dL   GFR calc non Af Amer 69 >59 mL/min/1.73   GFR calc  Af Amer 79 >59 mL/min/1.73   BUN/Creatinine Ratio 15 12 - 28   Sodium 139 134 - 144 mmol/L   Potassium 4.0 3.5 - 5.2 mmol/L   Chloride 100 96 - 106 mmol/L   CO2 26 20 - 29 mmol/L   Calcium 9.7 8.7 - 10.3 mg/dL  HgB Q5Z  Result Value Ref Range   Hgb A1c MFr Bld 6.0 (H) 4.8 - 5.6 %   Est. average glucose Bld gHb Est-mCnc 126 mg/dL  TSH  Result Value Ref Range   TSH 3.630 0.450 - 4.500 uIU/mL      Assessment & Plan:   Problem List Items Addressed This Visit      Cardiovascular and Mediastinum   Hypertension    Chronic, ongoing. BP in office today below goal.  She does have increased stressors lately and is not checking at home due to this.  Recommend she monitor at least a few times a week at home.  Continue Losartan 25 MG daily and adjust as needed -- she did not tolerate increase in this dose to 50 MG in past and had dizziness.   Focus on DASH diet at home.  Return to office in 7 weeks for BP check and reassurance.          Other   Hyperlipidemia    Chronic, ongoing.  Continues every other day statin which has decreased side effects.  Lipid panel up to date, LDL 82. Adjust dose as needed.          Follow up plan: Return in about 6 weeks (around 10/12/2019) for HTN.

## 2019-08-31 NOTE — Assessment & Plan Note (Signed)
Chronic, ongoing.  Continues every other day statin which has decreased side effects.  Lipid panel up to date, LDL 82. Adjust dose as needed.

## 2019-09-05 ENCOUNTER — Telehealth: Payer: Self-pay

## 2019-09-26 ENCOUNTER — Encounter: Payer: Self-pay | Admitting: Ophthalmology

## 2019-09-26 ENCOUNTER — Other Ambulatory Visit: Payer: Self-pay

## 2019-10-02 ENCOUNTER — Other Ambulatory Visit
Admission: RE | Admit: 2019-10-02 | Discharge: 2019-10-02 | Disposition: A | Discharge status: Home / Self Care | Payer: Medicare Other | Source: Ambulatory Visit | Attending: Ophthalmology | Admitting: Ophthalmology

## 2019-10-02 ENCOUNTER — Other Ambulatory Visit: Payer: Self-pay

## 2019-10-02 DIAGNOSIS — Z01812 Encounter for preprocedural laboratory examination: Secondary | ICD-10-CM | POA: Diagnosis present

## 2019-10-02 DIAGNOSIS — Z20822 Contact with and (suspected) exposure to covid-19: Secondary | ICD-10-CM | POA: Insufficient documentation

## 2019-10-02 LAB — SARS CORONAVIRUS 2 (TAT 6-24 HRS): SARS Coronavirus 2: NEGATIVE

## 2019-10-02 NOTE — Discharge Instructions (Signed)

## 2019-10-03 ENCOUNTER — Telehealth: Payer: Self-pay

## 2019-10-03 NOTE — Telephone Encounter (Signed)
10/03/19 Spoke with patient she stated that she was not interested in resources for food right now. She and her husband are receiving congregate meals through [facility]. Manuela Schwartz submitted referral in June. No other resources needed at this time. Closing referral. Olean Ree [phone]

## 2019-10-04 ENCOUNTER — Encounter
Admission: RE | Disposition: A | Discharge status: Home / Self Care | Payer: Self-pay | Source: Home / Self Care | Attending: Ophthalmology

## 2019-10-04 ENCOUNTER — Ambulatory Visit
Admission: RE | Admit: 2019-10-04 | Discharge: 2019-10-04 | Disposition: A | Discharge status: Home / Self Care | Payer: Medicare Other | Attending: Ophthalmology | Admitting: Ophthalmology

## 2019-10-04 ENCOUNTER — Ambulatory Visit: Payer: Medicare Other | Admitting: Anesthesiology

## 2019-10-04 ENCOUNTER — Encounter: Payer: Self-pay | Admitting: Ophthalmology

## 2019-10-04 ENCOUNTER — Other Ambulatory Visit: Payer: Self-pay

## 2019-10-04 DIAGNOSIS — M810 Age-related osteoporosis without current pathological fracture: Secondary | ICD-10-CM | POA: Insufficient documentation

## 2019-10-04 DIAGNOSIS — H2512 Age-related nuclear cataract, left eye: Secondary | ICD-10-CM | POA: Insufficient documentation

## 2019-10-04 DIAGNOSIS — Z79899 Other long term (current) drug therapy: Secondary | ICD-10-CM | POA: Diagnosis not present

## 2019-10-04 DIAGNOSIS — K219 Gastro-esophageal reflux disease without esophagitis: Secondary | ICD-10-CM | POA: Insufficient documentation

## 2019-10-04 DIAGNOSIS — M1990 Unspecified osteoarthritis, unspecified site: Secondary | ICD-10-CM | POA: Insufficient documentation

## 2019-10-04 DIAGNOSIS — I10 Essential (primary) hypertension: Secondary | ICD-10-CM | POA: Diagnosis not present

## 2019-10-04 DIAGNOSIS — E785 Hyperlipidemia, unspecified: Secondary | ICD-10-CM | POA: Insufficient documentation

## 2019-10-04 DIAGNOSIS — Z7982 Long term (current) use of aspirin: Secondary | ICD-10-CM | POA: Diagnosis not present

## 2019-10-04 HISTORY — PX: CATARACT EXTRACTION W/PHACO: SHX586

## 2019-10-04 SURGERY — PHACOEMULSIFICATION, CATARACT, WITH IOL INSERTION
Anesthesia: Monitor Anesthesia Care | Site: Eye | Laterality: Left

## 2019-10-04 MED ORDER — PROVISC 10 MG/ML IO SOLN
INTRAOCULAR | Status: DC | PRN
  Administered 2019-10-04: 0.55 mL via INTRAOCULAR

## 2019-10-04 MED ORDER — NA CHONDROIT SULF-NA HYALURON 40-17 MG/ML IO SOLN
INTRAOCULAR | Status: DC | PRN
  Administered 2019-10-04: 1 mL via INTRAOCULAR

## 2019-10-04 MED ORDER — BRIMONIDINE TARTRATE-TIMOLOL 0.2-0.5 % OP SOLN
OPHTHALMIC | Status: DC | PRN
  Administered 2019-10-04: 1 [drp] via OPHTHALMIC

## 2019-10-04 MED ORDER — LACTATED RINGERS IV SOLN: INTRAVENOUS | Status: DC

## 2019-10-04 MED ORDER — LIDOCAINE HCL (PF) 2 % IJ SOLN
INTRAOCULAR | Status: DC | PRN
  Administered 2019-10-04: 1 mL via INTRAOCULAR

## 2019-10-04 MED ORDER — MIDAZOLAM HCL 2 MG/2ML IJ SOLN
INTRAMUSCULAR | Status: DC | PRN
  Administered 2019-10-04: 1 mg via INTRAVENOUS

## 2019-10-04 MED ORDER — EPINEPHRINE PF 1 MG/ML IJ SOLN
INTRAOCULAR | Status: DC | PRN
  Administered 2019-10-04: 64 mL via OPHTHALMIC

## 2019-10-04 MED ORDER — TETRACAINE 0.5 % OP SOLN OPTIME - NO CHARGE
OPHTHALMIC | Status: DC | PRN
  Administered 2019-10-04: 1 [drp] via OPHTHALMIC

## 2019-10-04 MED ORDER — MOXIFLOXACIN HCL 0.5 % OP SOLN
OPHTHALMIC | Status: DC | PRN
  Administered 2019-10-04: 0.2 mL via OPHTHALMIC

## 2019-10-04 MED ORDER — FENTANYL CITRATE (PF) 100 MCG/2ML IJ SOLN
INTRAMUSCULAR | Status: DC | PRN
Start: 2019-10-04 — End: 2019-10-04
  Administered 2019-10-04: 50 ug via INTRAVENOUS

## 2019-10-04 MED ORDER — ARMC OPHTHALMIC DILATING DROPS
1.0000 "application " | OPHTHALMIC | Status: DC | PRN
  Administered 2019-10-04 (×3): 1 via OPHTHALMIC

## 2019-10-04 MED ORDER — TRYPAN BLUE 0.06 % OP SOLN
OPHTHALMIC | Status: DC | PRN
  Administered 2019-10-04: 0.5 mL via INTRAOCULAR

## 2019-10-04 MED ORDER — ACETAMINOPHEN 325 MG PO TABS: 325.0000 mg | ORAL_TABLET | Freq: Once | ORAL | Status: DC

## 2019-10-04 MED ORDER — ACETAMINOPHEN 160 MG/5ML PO SOLN: 325.0000 mg | Freq: Once | ORAL | Status: DC

## 2019-10-04 MED ORDER — TETRACAINE HCL 0.5 % OP SOLN
1.0000 [drp] | OPHTHALMIC | Status: DC | PRN
  Administered 2019-10-04 (×3): 1 [drp] via OPHTHALMIC

## 2019-10-04 SURGICAL SUPPLY — 20 items
DISSECTOR HYDRO NUCLEUS 50X22 (MISCELLANEOUS) ×12 IMPLANT
DRSG TEGADERM 2-3/8X2-3/4 SM (GAUZE/BANDAGES/DRESSINGS) ×3 IMPLANT
GLOVE BIOGEL PI IND STRL 8 (GLOVE) ×1 IMPLANT
GLOVE BIOGEL PI INDICATOR 8 (GLOVE) ×6
GOWN STRL REUS W/ TWL LRG LVL3 (GOWN DISPOSABLE) ×1 IMPLANT
GOWN STRL REUS W/ TWL XL LVL3 (GOWN DISPOSABLE) ×1 IMPLANT
GOWN STRL REUS W/TWL LRG LVL3 (GOWN DISPOSABLE) ×3
GOWN STRL REUS W/TWL XL LVL3 (GOWN DISPOSABLE) ×3
KNIFE 45D UP 2.3 (MISCELLANEOUS) ×3 IMPLANT
LENS IOL DIOP 19.0 (Intraocular Lens) ×3 IMPLANT
LENS IOL TECNIS MONO 19.0 (Intraocular Lens) IMPLANT
MARKER SKIN DUAL TIP RULER LAB (MISCELLANEOUS) ×3 IMPLANT
NDL CAPSULORHEX 25GA (NEEDLE) ×1 IMPLANT
NEEDLE CAPSULORHEX 25GA (NEEDLE) ×3 IMPLANT
PACK CATARACT (MISCELLANEOUS) ×3 IMPLANT
PACK DR. KING ARMS (PACKS) ×3 IMPLANT
PACK EYE AFTER SURG (MISCELLANEOUS) ×3 IMPLANT
SOLUTION OPHTHALMIC SALT (MISCELLANEOUS) ×3 IMPLANT
WATER STERILE IRR 250ML POUR (IV SOLUTION) ×3 IMPLANT
WIPE NON LINTING 3.25X3.25 (MISCELLANEOUS) ×3 IMPLANT

## 2019-10-04 NOTE — Transfer of Care (Signed)
Immediate Anesthesia Transfer of Care Note  Patient: Carol [name]  Procedure(s) Performed: CATARACT EXTRACTION PHACO AND INTRAOCULAR LENS PLACEMENT (IOC) LEFT VISION BLUE (Left Eye)  Patient Location: PACU  Anesthesia Type: MAC  Level of Consciousness: awake, alert  and patient cooperative  Airway and Oxygen Therapy: Patient Spontanous Breathing and Patient connected to supplemental oxygen  Post-op Assessment: Post-op Vital signs reviewed, Patient's Cardiovascular Status Stable, Respiratory Function Stable, Patent Airway and No signs of Nausea or vomiting  Post-op Vital Signs: Reviewed and stable  Complications: No complications documented.

## 2019-10-04 NOTE — Anesthesia Postprocedure Evaluation (Signed)
Anesthesia Post Note  Patient: Carol [name]  Procedure(s) Performed: CATARACT EXTRACTION PHACO AND INTRAOCULAR LENS PLACEMENT (IOC) LEFT VISION BLUE (Left Eye)     Patient location during evaluation: PACU Anesthesia Type: MAC Level of consciousness: awake and alert and oriented Pain management: satisfactory to patient Vital Signs Assessment: post-procedure vital signs reviewed and stable Respiratory status: spontaneous breathing, nonlabored ventilation and respiratory function stable Cardiovascular status: blood pressure returned to baseline and stable Postop Assessment: Adequate PO intake and No signs of nausea or vomiting Anesthetic complications: no   No complications documented.  Raliegh Ip

## 2019-10-04 NOTE — Op Note (Signed)
  PREOPERATIVE DIAGNOSIS:  Nuclear sclerotic cataract of the LEFT eye.   POSTOPERATIVE DIAGNOSIS:  Nuclear sclerotic cataract of the LEFT eye.   OPERATIVE PROCEDURE: Cataract surgery OS   SURGEON:  Elliot Cousin, MD.   ANESTHESIA:  Anesthesiologist: Ranee Gosselin, MD CRNA: Jimmy Picket, CRNA  1.      Managed anesthesia care. 2.     0.26ml of Shugarcaine was instilled following the paracentesis   COMPLICATIONS:  None.   TECHNIQUE:   Divide and conquer   DESCRIPTION OF PROCEDURE:  The patient was examined and consented in the preoperative holding area where the aforementioned topical anesthesia was applied to the LEFT eye and then brought back to the Operating Room where the left eye was prepped and draped in the usual sterile ophthalmic fashion and a lid speculum was placed. A paracentesis was created with the side port blade, the anterior chamber was washed out with trypan blue to stain the anterior capsule, and the anterior chamber was filled with viscoelastic. A near clear corneal incision was performed with the steel keratome. A continuous curvilinear capsulorrhexis was performed with a cystotome followed by the capsulorrhexis forceps. Hydrodissection and hydrodelineation were carried out with BSS on a blunt cannula. The lens was removed in a divide and conquer  technique and the remaining cortical material was removed with the irrigation-aspiration handpiece. The capsular bag was inflated with viscoelastic and the lens was placed in the capsular bag without complication. The remaining viscoelastic was removed from the eye with the irrigation-aspiration handpiece. The wounds were hydrated. The anterior chamber was flushed and the eye was inflated to physiologic pressure. 0.9ml Vigamox was placed in the anterior chamber. The wounds were found to be water tight. The eye was dressed with Vigamox. The patient was given protective glasses to wear throughout the day and a shield with which to  sleep tonight. The patient was also given drops with which to begin a drop regimen today and will follow-up with me in one day. Implant Name Type Inv. Item Serial No. Manufacturer Lot No. LRB No. Used Action  LENS IOL DIOP 19.0 - [id number] Intraocular Lens LENS IOL DIOP 19.0 [id number] [employer]  Left 1 Implanted    Procedure(s) with comments: CATARACT EXTRACTION PHACO AND INTRAOCULAR LENS PLACEMENT (IOC) LEFT VISION BLUE (Left) - 6.47 0:39.8  Electronically signed: Elliot Cousin 10/04/2019 8:48 AM

## 2019-10-04 NOTE — H&P (Signed)
   I have reviewed the patient's H&P and agree with its findings. There have been no interval changes.  Amber Williard MD Ophthalmology 

## 2019-10-04 NOTE — Anesthesia Preprocedure Evaluation (Signed)
Anesthesia Evaluation  Patient identified by MRN, date of birth, ID band Patient awake    Reviewed: Allergy & Precautions, H&P , NPO status , Patient's Chart, lab work & pertinent test results  Airway Mallampati: II  TM Distance: >3 FB Neck ROM: full    Dental  (+) Missing, Poor Dentition   Pulmonary neg pulmonary ROS,    Pulmonary exam normal breath sounds clear to auscultation       Cardiovascular hypertension, Normal cardiovascular exam Rhythm:regular Rate:Normal  HLD   Neuro/Psych    GI/Hepatic GERD  ,  Endo/Other    Renal/GU      Musculoskeletal  (+) Arthritis ,   Abdominal   Peds  Hematology   Anesthesia Other Findings   Reproductive/Obstetrics                             Anesthesia Physical  Anesthesia Plan  ASA: II  Anesthesia Plan: MAC   Post-op Pain Management:    Induction: Intravenous  PONV Risk Score and Plan: 2 and Treatment may vary due to age or medical condition, TIVA and Midazolam  Airway Management Planned: Natural Airway and Nasal Cannula  Additional Equipment:   Intra-op Plan:   Post-operative Plan:   Informed Consent: I have reviewed the patients History and Physical, chart, labs and discussed the procedure including the risks, benefits and alternatives for the proposed anesthesia with the patient or authorized representative who has indicated his/her understanding and acceptance.     Dental Advisory Given  Plan Discussed with: CRNA  Anesthesia Plan Comments:         Anesthesia Quick Evaluation

## 2019-10-04 NOTE — Anesthesia Procedure Notes (Signed)
Procedure Name: MAC Performed by: Courtney Bellizzi, CRNA Pre-anesthesia Checklist: Patient identified, Emergency Drugs available, Suction available, Timeout performed and Patient being monitored Patient Re-evaluated:Patient Re-evaluated prior to induction Oxygen Delivery Method: Nasal cannula Placement Confirmation: positive ETCO2       

## 2019-10-05 ENCOUNTER — Encounter: Payer: Self-pay | Admitting: Ophthalmology

## 2019-10-09 ENCOUNTER — Telehealth: Payer: Self-pay | Admitting: General Practice

## 2019-10-09 ENCOUNTER — Ambulatory Visit (INDEPENDENT_AMBULATORY_CARE_PROVIDER_SITE_OTHER): Payer: Medicare Other | Admitting: General Practice

## 2019-10-09 DIAGNOSIS — I10 Essential (primary) hypertension: Secondary | ICD-10-CM

## 2019-10-09 DIAGNOSIS — E782 Mixed hyperlipidemia: Secondary | ICD-10-CM | POA: Diagnosis not present

## 2019-10-09 DIAGNOSIS — F439 Reaction to severe stress, unspecified: Secondary | ICD-10-CM

## 2019-10-09 DIAGNOSIS — F419 Anxiety disorder, unspecified: Secondary | ICD-10-CM

## 2019-10-09 DIAGNOSIS — M1990 Unspecified osteoarthritis, unspecified site: Secondary | ICD-10-CM | POA: Diagnosis not present

## 2019-10-09 NOTE — Chronic Care Management (AMB) (Signed)
Chronic Care Management   Follow Up Note   10/09/2019 Name: Carol [name] MRN: [id number] DOB: [birth year]  Referred by: Marjie Skiff, NP Reason for referral : Chronic Care Management (RNCM follow up for Chronic Disease Management and Care Coordination Needs)   Carol [name] is a 69 y.o. year old female who is a primary care patient of Cannady, Dorie Rank, NP. The CCM team was consulted for assistance with chronic disease management and care coordination needs.    Review of patient status, including review of consultants reports, relevant laboratory and other test results, and collaboration with appropriate care team members and the patient's provider was performed as part of comprehensive patient evaluation and provision of chronic care management services.    SDOH (Social Determinants of Health) assessments performed: Yes See Care Plan activities for detailed interventions related to [facility])     Outpatient Encounter Medications as of 10/09/2019  Medication Sig  . aspirin 81 MG tablet Take 81 mg by mouth daily.  [name] atorvastatin (LIPITOR) 10 MG tablet Take one tablet (10 MG) by mouth every other day.  . fluticasone (FLONASE) 50 MCG/ACT nasal spray SPRAY 2 SPRAYS INTO EACH NOSTRIL ONCE A DAY  . loratadine (CLARITIN) 10 MG tablet Take 10 mg by mouth daily.  [name] losartan (COZAAR) 50 MG tablet Take 0.5 tablets (25 mg total) by mouth daily.  . meclizine (ANTIVERT) 25 MG tablet Take 1 tablet (25 mg total) by mouth 3 (three) times daily as needed for dizziness.  . meloxicam (MOBIC) 15 MG tablet Take 15 mg by mouth as needed for pain.   No facility-administered encounter medications on file as of 10/09/2019.     Objective:  BP Readings from Last 3 Encounters:  10/04/19 (!) 155/85  08/31/19 128/72  08/16/19 (!) 145/92    Goals Addressed              This Visit's Progress   .  RNCM: "Hospice has come in to help [name]" (pt-stated)        CARE PLAN ENTRY (see longitudinal plan of  care for additional care plan information)  Current Barriers:  [name] Knowledge Deficits related to stress and depression over the patients husbands  recent diagnosis of cancer with recommendations of comfort care  . Care Coordination needs related to nutritional resources and help with utilities  in a patient with HTN/HLD and stress due to husbands terminal illness (disease states) . Lacks caregiver support.  . Corporate treasurer.   Nurse Case Manager Clinical Goal(s):  [name] Over the next 120 days, patient will work with RNCM, pcp and CCM team to address needs related to stress reduction and needs in the home due to the recent terminal diagnosis of her husband  . Over the next 120 days, patient will attend all scheduled medical appointments: next appointment with pcp on 07/13/2019 . Over the next 120 days, patient will work with CM clinical social worker to help with coping and grief of husbands terminal diagnosis and other needs related to financial constraints . Over the next 120 days, patient will work with care guides (community agency) to assist with food resources such as [employer] and resources to help with the high utility bills.   Interventions:  . Inter-disciplinary care team collaboration (see longitudinal plan of care) . Evaluation of current treatment plan related to stress/anxiety/depression and patient's adherence to plan as established by provider.  The patient states that she is maintaining her health and well being. The nurse  was out today to see Carol [name] and he was having a good day. Education on the importance of self care when taking care of spouse. 10-09-2019: Evaluation of stress and anxiety. The patient states they are both doing okay and she is taking care of her self. States she is eating well and sleeping well.  . Advised patient to call the pcp and CCM team for questions or concerns . Provided education to patient re: community resources to help with food and other  resources and to expect a call from the care guide team. Education on referral to the care guides for help with food resources. 10-09-2019: the patient declined [employer] at this time. Is getting food from a local church and will let the RNCM know if there are changes.  . Reviewed scheduled/upcoming provider appointments including: upcoming appointment on 10-09-2019 with pcp . Care Guide referral for help with food resources and high utility bill.  completed . Social Work referral for coping/grief mechanisms related to new terminal illness diagnosis in the patients husband with recommendations for comfort care. Hospice is coming into the home.   Patient Self Care Activities:  . Patient verbalizes understanding of plan to work with the CCM team to meet her health and wellness needs  . Attends all scheduled provider appointments . Calls provider office for new concerns or questions . Unable to independently manage stress/anxiety/depression  Please see past updates related to this goal by clicking on the "Past Updates" button in the selected goal      .  RNCM: Pt-"I am not checking my blood pressure at home it freaks me out" (pt-stated)        CARE PLAN ENTRY (see longtitudinal plan of care for additional care plan information)  Current Barriers:  . Chronic Disease Management support, education, and care coordination needs related to HTN and HLD  Clinical Goal(s) related to HTN and HLD:  Over the next 120 days, patient will:  . Work with the care management team to address educational, disease management, and care coordination needs  . Begin or continue self health monitoring activities as directed today  adhere to a heart healthy diet and monitor blood pressure if instructed by pcp . Call provider office for new or worsened signs and symptoms New or worsened symptom related to HTN/HLD . Call care management team with questions or concerns . Verbalize basic understanding of patient  centered plan of care established today  Interventions related to HTN and HLD:  . Evaluation of current treatment plans and patient's adherence to plan as established by provider.  Has upcoming appointments to see pcp. Next appointment on 8-27--2021.  . Assessed patient understanding of disease states.  The patient verbalized understanding of her conditions. Discussed taking care of self and needs.  The patient has stressors related to being the caregiver of her elderly husband. The patient had recent cataract surgery on 08-16-2019 and is doing well. 10-09-2019: the patient completed her second cataract surgery on 10-04-2019- she is doing well. Will see the specialist Friday afternoon for follow up post cataract surgery on 10-12-2019.  . Assessed patient's education and care coordination needs.  The patient expressed that they did not qualify for food stamps. The patient is interested on [employer] or food resources in the area. Will do a care guide referral for food resources. 10-09-2019: Has adequate food resources at this time.  . Provided disease specific education to patient.  Education on taking blood pressures and recording. The  patient states her blood pressure was good during her procedure because if it had been high she would not have been able to have to procedure. The patient will try to take it at home and record. 10-09-2019:The patient is not taking her blood pressures at home but states it was good or they would not have done her cataract surgery. The patient states that she feels fine. Education on the benefits of taking blood pressures and recording findings. Will continue to monitor.  Steele Sizer with appropriate clinical care team members regarding patient needs.  The patient knows about the LCSW and Pharm D on the team. The patient denies any other needs.  . Evaluation of upcoming appointments. Sees pcp on 10-12-2019 and will follow up with the specialist on 10-12-2019 post cataract  surgery.   Patient Self Care Activities related to HTN and HLD:  . Patient is unable to independently self-manage chronic health conditions  Please see past updates related to this goal by clicking on the "Past Updates" button in the selected goal          Plan:   Telephone follow up appointment with care management team member scheduled for: 12-11-2019 at 0945 am   Alto Denver RN, MSN, CCM Community Care Coordinator [place]  [facility] Mobile: [phone]

## 2019-10-09 NOTE — Patient Instructions (Signed)
Visit Information  Goals Addressed              This Visit's Progress     RNCM: "Hospice has come in to help [name]" (pt-stated)        CARE PLAN ENTRY (see longitudinal plan of care for additional care plan information)  Current Barriers:   Knowledge Deficits related to stress and depression over the patients husbands  recent diagnosis of cancer with recommendations of comfort care   Care Coordination needs related to nutritional resources and help with utilities  in a patient with HTN/HLD and stress due to husbands terminal illness (disease states)  Lacks caregiver support.   Corporate treasurer.   Nurse Case Manager Clinical Goal(s):   Over the next 120 days, patient will work with RNCM, pcp and CCM team to address needs related to stress reduction and needs in the home due to the recent terminal diagnosis of her husband   Over the next 120 days, patient will attend all scheduled medical appointments: next appointment with pcp on 07/13/2019  Over the next 120 days, patient will work with CM clinical social worker to help with coping and grief of husbands terminal diagnosis and other needs related to financial constraints  Over the next 120 days, patient will work with care guides (community agency) to assist with food resources such as [employer] and resources to help with the high utility bills.   Interventions:   Inter-disciplinary care team collaboration (see longitudinal plan of care)  Evaluation of current treatment plan related to stress/anxiety/depression and patient's adherence to plan as established by provider.  The patient states that she is maintaining her health and well being. The nurse was out today to see Derryl [place] and he was having a good day. Education on the importance of self care when taking care of spouse. 10-09-2019: Evaluation of stress and anxiety. The patient states they are both doing okay and she is taking care of her self. States she is  eating well and sleeping well.   Advised patient to call the pcp and CCM team for questions or concerns  Provided education to patient re: community resources to help with food and other resources and to expect a call from the care guide team. Education on referral to the care guides for help with food resources. 10-09-2019: the patient declined [employer] at this time. Is getting food from a local church and will let the RNCM know if there are changes.   Reviewed scheduled/upcoming provider appointments including: upcoming appointment on 10-09-2019 with pcp  Care Guide referral for help with food resources and high utility bill.  completed  Social Work referral for coping/grief mechanisms related to new terminal illness diagnosis in the patients husband with recommendations for comfort care. Hospice is coming into the home.   Patient Self Care Activities:   Patient verbalizes understanding of plan to work with the CCM team to meet her health and wellness needs   Attends all scheduled provider appointments  Calls provider office for new concerns or questions  Unable to independently manage stress/anxiety/depression  Please see past updates related to this goal by clicking on the "Past Updates" button in the selected goal        RNCM: Pt-"I am not checking my blood pressure at home it freaks me out" (pt-stated)        CARE PLAN ENTRY (see longtitudinal plan of care for additional care plan information)  Current Barriers:   Chronic Disease Management  support, education, and care coordination needs related to HTN and HLD  Clinical Goal(s) related to HTN and HLD:  Over the next 120 days, patient will:   Work with the care management team to address educational, disease management, and care coordination needs   Begin or continue self health monitoring activities as directed today  adhere to a heart healthy diet and monitor blood pressure if instructed by pcp  Call provider  office for new or worsened signs and symptoms New or worsened symptom related to HTN/HLD  Call care management team with questions or concerns  Verbalize basic understanding of patient centered plan of care established today  Interventions related to HTN and HLD:   Evaluation of current treatment plans and patient's adherence to plan as established by provider.  Has upcoming appointments to see pcp. Next appointment on 8-27--2021.   Assessed patient understanding of disease states.  The patient verbalized understanding of her conditions. Discussed taking care of self and needs.  The patient has stressors related to being the caregiver of her elderly husband. The patient had recent cataract surgery on 08-16-2019 and is doing well. 10-09-2019: the patient completed her second cataract surgery on 10-04-2019- she is doing well. Will see the specialist Friday afternoon for follow up post cataract surgery on 10-12-2019.   Assessed patient's education and care coordination needs.  The patient expressed that they did not qualify for food stamps. The patient is interested on [employer] or food resources in the area. Will do a care guide referral for food resources. 10-09-2019: Has adequate food resources at this time.   Provided disease specific education to patient.  Education on taking blood pressures and recording. The patient states her blood pressure was good during her procedure because if it had been high she would not have been able to have to procedure. The patient will try to take it at home and record. 10-09-2019:The patient is not taking her blood pressures at home but states it was good or they would not have done her cataract surgery. The patient states that she feels fine. Education on the benefits of taking blood pressures and recording findings. Will continue to monitor.   Collaborated with appropriate clinical care team members regarding patient needs.  The patient knows about the LCSW  and Pharm D on the team. The patient denies any other needs.   Evaluation of upcoming appointments. Sees pcp on 10-12-2019 and will follow up with the specialist on 10-12-2019 post cataract surgery.   Patient Self Care Activities related to HTN and HLD:   Patient is unable to independently self-manage chronic health conditions  Please see past updates related to this goal by clicking on the "Past Updates" button in the selected goal         Patient verbalizes understanding of instructions provided today.   Telephone follow up appointment with care management team member scheduled for: 12-11-2019 at 9:45 am  Alto Denver RN, MSN, CCM Community Care Coordinator [place]   [facility] Mobile: [phone]

## 2019-10-10 ENCOUNTER — Ambulatory Visit: Payer: Self-pay | Admitting: Licensed Clinical Social Worker

## 2019-10-10 DIAGNOSIS — M1990 Unspecified osteoarthritis, unspecified site: Secondary | ICD-10-CM

## 2019-10-10 DIAGNOSIS — E782 Mixed hyperlipidemia: Secondary | ICD-10-CM | POA: Diagnosis not present

## 2019-10-10 DIAGNOSIS — I10 Essential (primary) hypertension: Secondary | ICD-10-CM | POA: Diagnosis not present

## 2019-10-10 DIAGNOSIS — F419 Anxiety disorder, unspecified: Secondary | ICD-10-CM

## 2019-10-10 DIAGNOSIS — F439 Reaction to severe stress, unspecified: Secondary | ICD-10-CM

## 2019-10-10 NOTE — Chronic Care Management (AMB) (Signed)
Chronic Care Management    Clinical Social Work Follow Up Note  10/10/2019 Name: Carol [name] MRN: [id number] DOB: [birth year]  Carol [name] is a 69 y.o. year old female who is a primary care patient of Cannady, Dorie Rank, NP. The CCM team was consulted for assistance with [employer]  and Mental Health Counseling and Resources.   Review of patient status, including review of consultants reports, other relevant assessments, and collaboration with appropriate care team members and the patient's provider was performed as part of comprehensive patient evaluation and provision of chronic care management services.    SDOH (Social Determinants of Health) assessments performed: Yes    Outpatient Encounter Medications as of 10/10/2019  Medication Sig  . aspirin 81 MG tablet Take 81 mg by mouth daily.  [name] atorvastatin (LIPITOR) 10 MG tablet Take one tablet (10 MG) by mouth every other day.  . fluticasone (FLONASE) 50 MCG/ACT nasal spray SPRAY 2 SPRAYS INTO EACH NOSTRIL ONCE A DAY  . loratadine (CLARITIN) 10 MG tablet Take 10 mg by mouth daily.  [name] losartan (COZAAR) 50 MG tablet Take 0.5 tablets (25 mg total) by mouth daily.  . meclizine (ANTIVERT) 25 MG tablet Take 1 tablet (25 mg total) by mouth 3 (three) times daily as needed for dizziness.  . meloxicam (MOBIC) 15 MG tablet Take 15 mg by mouth as needed for pain.   No facility-administered encounter medications on file as of 10/10/2019.     Goals Addressed    .  SW: "I am managing my stress as best as I can." (pt-stated)        Current Barriers:  . Acute Mental Health needs related to Caregiver strain, self -care and stress management . Financial constraints related to managing health care expenses . Limited social support . Limited access to food . Level of care concerns . ADL IADL limitations . Social Isolation . Limited access to caregiver . Suicidal Ideation/Homicidal Ideation: No  Clinical Social Work Goal(s):   [name] Over  the next 120 days, patient/caregiver will work with SW to address concerns related to caregiver strain/community resource connection. LCSW will assist patient in gaining additional support/resource connection and community resource education in order to maintain health and mental health appropriately  . Over the next 120 days, patient will demonstrate improved adherence to self care as evidenced by implementing healthy self-care into her daily routine such as: attending all medical appointments, deep breathing exercises, taking time for self-reflection, taking medications as prescribed, drinking water and daily exercise to improve mobility and mood.  . Over the next 120 days, patient will work with SW by telephone or in person to reduce or manage symptoms related to stress and anxiety . Over the next 120 days, patient will demonstrate improved health management independence as evidenced by implementing healthy self-care skills and positive support/resources into her daily routine to help cope with stressors and improve overall health and well-being  . Over the next 120 days, patient or caregiver will verbalize basic understanding of depression/stress process and self health management plan as evidenced by her participation in development of long term plan of care and institution of self health management strategies   Interventions: . Patient interviewed and appropriate assessments performed: brief mental health assessment . Patient reports stable transportation to upcoming PCP appointment to monitor her blood pressure. Patient does not use her blood pressure monitor at home to check her readings as this is very stressful for her and causes her great anxiety after  getting the result. Patient physically comes into PCP office every 2-3 weeks to check blood pressure.  . Patient reports having a recent successful cataract surgery.  . Provided counseling with regard to active caregiver strain and stress.  Patient reports that Hospice is still involved which has been a great additional support for family. LCSW discussed coping skills for stress management. SW used empathetic and active and reflective listening, validated patient's feelings/concerns, and provided emotional support. LCSW provided self-care education to help manage her multiple health conditions and improve her mood.  . Provided patient with information about available caregiver support groups and programs within [facility].  . Discussed plans with patient for ongoing care management follow up and provided patient with direct contact information for care management team . Past C3 referral made for food support. Patient declined needing this support when C3 Guide contacted her. Patient reports that she received a $50.00 gift card from Hospice to get groceries which helped family.  . Assisted patient/caregiver with obtaining information about health plan benefits . Provided education and assistance to client regarding Advanced Directives. . Provided education to patient/caregiver regarding level of care options. . Provided education to patient/caregiver about Hospice and/or Palliative Care services . Emotional/Supportive Counseling provided throughout entire session.   Patient Self Care Activities:  . Calls provider office for new concerns or questions . Ability for insight . Independent living . Motivation for treatment . Strong family or social support  Patient Coping Strengths:  . Supportive Relationships . Spirituality . Hopefulness . Self Advocate . Able to Communicate Effectively  Patient Self Care Deficits:  . Lacks social connections  Initial goal documentation       Follow Up Plan: SW will follow up with patient by phone over the next quarter  Dickie La, BSW, MSW, LCSW [facility] Care Management [place]  [facility] [email or web address] Phone:  [phone]

## 2019-10-12 ENCOUNTER — Ambulatory Visit (INDEPENDENT_AMBULATORY_CARE_PROVIDER_SITE_OTHER): Payer: Medicare Other | Admitting: Nurse Practitioner

## 2019-10-12 ENCOUNTER — Other Ambulatory Visit: Payer: Self-pay

## 2019-10-12 ENCOUNTER — Encounter: Payer: Self-pay | Admitting: Nurse Practitioner

## 2019-10-12 VITALS — BP 128/80 | HR 74 | Temp 97.8°F | Wt 156.0 lb

## 2019-10-12 DIAGNOSIS — R7309 Other abnormal glucose: Secondary | ICD-10-CM

## 2019-10-12 DIAGNOSIS — I10 Essential (primary) hypertension: Secondary | ICD-10-CM

## 2019-10-12 DIAGNOSIS — R7989 Other specified abnormal findings of blood chemistry: Secondary | ICD-10-CM | POA: Diagnosis not present

## 2019-10-12 DIAGNOSIS — H9193 Unspecified hearing loss, bilateral: Secondary | ICD-10-CM

## 2019-10-12 NOTE — Assessment & Plan Note (Signed)
Chronic, ongoing. BP in office today below goal.  She does have increased stressors lately and is not checking at home due to this.  Recommend she monitor at least a few times a week at home.  Continue Losartan 25 MG daily and adjust as needed -- she did not tolerate increase in this dose to 50 MG in past and had dizziness.   Focus on DASH diet at home.  Return to office in 6 weeks for BP check and reassurance.

## 2019-10-12 NOTE — Assessment & Plan Note (Signed)
Recheck A1C at next visit in 6 weeks and change plan of care as needed.

## 2019-10-12 NOTE — Progress Notes (Signed)
BP 128/80   Pulse 74   Temp 97.8 F (36.6 C) (Oral)   Wt 156 lb (70.8 kg)   LMP  (LMP Unknown)   SpO2 99%   BMI 27.63 kg/m    Subjective:    Patient ID: Carol [name], female    DOB: [birth year], 69 y.o.   MRN: [id number]  HPI: Carol [name] is a 69 y.o. female  Chief Complaint  Patient presents with  . Hypertension   HYPERTENSION / HYPERLIPIDEMIA Continues on Losartan 25 MG daily.  Continues On Lipitor every other day. Has history of elevation in TSH, but recent levels have remained normal.  History of elevation in A1C, 6% in May, no current symptoms and focused on diet.  Wishes to hold off on labs today. Satisfied with current treatment? yes Duration of hypertension: chronic BP monitoring frequency: not checking BP range:  BP medication side effects: no Duration of hyperlipidemia: chronic Cholesterol medication side effects: no Cholesterol supplements: none Medication compliance: good compliance Aspirin: yes Recent stressors: no Recurrent headaches: no Visual changes: no Palpitations: no Dyspnea: no Chest pain: no Lower extremity edema: no Dizzy/lightheaded: no  HEARING TROUBLE Reports wishes to have hearing test and ENT referral, feels her hearing is not as good over past months.  Trouble with soft sounds.  No tinnitus.  No history of working in loud environments. Duration: months Involved ear(s): bilateral Otorrhea: no Upper respiratory infection symptoms: no Pruritus: no Hearing loss: yes Water immersion no Using Q-tips: sometimes   Relevant past medical, surgical, family and social history reviewed and updated as indicated. Interim medical history since our last visit reviewed. Allergies and medications reviewed and updated.  Review of Systems  Constitutional: Negative for activity change, appetite change, diaphoresis, fatigue and fever.  Respiratory: Negative for cough, chest tightness and shortness of breath.   Cardiovascular: Negative for chest  pain, palpitations and leg swelling.  Gastrointestinal: Negative.   Neurological: Negative.   Psychiatric/Behavioral: Negative.     Per HPI unless specifically indicated above     Objective:    BP 128/80   Pulse 74   Temp 97.8 F (36.6 C) (Oral)   Wt 156 lb (70.8 kg)   LMP  (LMP Unknown)   SpO2 99%   BMI 27.63 kg/m   Wt Readings from Last 3 Encounters:  10/12/19 156 lb (70.8 kg)  10/04/19 156 lb (70.8 kg)  08/31/19 156 lb (70.8 kg)    Physical Exam Vitals and nursing note reviewed.  Constitutional:      General: She is awake. She is not in acute distress.    Appearance: She is well-developed and overweight. She is not ill-appearing.  HENT:     Head: Normocephalic.     Right Ear: Hearing, tympanic membrane, ear canal and external ear normal.     Left Ear: Hearing, tympanic membrane, ear canal and external ear normal.  Eyes:     General: Lids are normal.        Right eye: No discharge.        Left eye: No discharge.     Conjunctiva/sclera: Conjunctivae normal.     Pupils: Pupils are equal, round, and reactive to light.  Neck:     Vascular: No carotid bruit.  Cardiovascular:     Rate and Rhythm: Normal rate and regular rhythm.     Heart sounds: Normal heart sounds. No murmur heard.  No gallop.   Pulmonary:     Effort: Pulmonary effort is normal. No accessory  muscle usage or respiratory distress.     Breath sounds: Normal breath sounds.  Abdominal:     General: Bowel sounds are normal.     Palpations: Abdomen is soft.  Musculoskeletal:     Cervical back: Normal range of motion and neck supple.     Right lower leg: No edema.     Left lower leg: No edema.  Skin:    General: Skin is warm and dry.  Neurological:     Mental Status: She is alert and oriented to person, place, and time.  Psychiatric:        Attention and Perception: Attention normal.        Mood and Affect: Mood normal.        Speech: Speech normal.        Behavior: Behavior normal. Behavior is  cooperative.        Thought Content: Thought content normal.    Results for orders placed or performed during the hospital encounter of 10/02/19  SARS CORONAVIRUS 2 (TAT 6-24 HRS) Nasopharyngeal Nasopharyngeal Swab   Specimen: Nasopharyngeal Swab  Result Value Ref Range   SARS Coronavirus 2 NEGATIVE NEGATIVE      Assessment & Plan:   Problem List Items Addressed This Visit      Cardiovascular and Mediastinum   Hypertension - Primary    Chronic, ongoing. BP in office today below goal.  She does have increased stressors lately and is not checking at home due to this.  Recommend she monitor at least a few times a week at home.  Continue Losartan 25 MG daily and adjust as needed -- she did not tolerate increase in this dose to 50 MG in past and had dizziness.   Focus on DASH diet at home.  Return to office in 6 weeks for BP check and reassurance.          Other   Elevated TSH    Recheck TSH next visit, no current symptoms.      Elevated hemoglobin A1c    Recheck A1C at next visit in 6 weeks and change plan of care as needed.       Other Visit Diagnoses    Bilateral hearing loss, unspecified hearing loss type       Referral to ENT per patient request.   Relevant Orders   Ambulatory referral to ENT       Follow up plan: Return in about 6 weeks (around 11/23/2019) for HTN, A1C check, and HLD.

## 2019-10-12 NOTE — Assessment & Plan Note (Signed)
Recheck TSH next visit, no current symptoms.

## 2019-10-12 NOTE — Patient Instructions (Signed)
DASH Eating Plan DASH stands for "Dietary Approaches to Stop Hypertension." The DASH eating plan is a healthy eating plan that has been shown to reduce high blood pressure (hypertension). It may also reduce your risk for type 2 diabetes, heart disease, and stroke. The DASH eating plan may also help with weight loss. What are tips for following this plan?  General guidelines  Avoid eating more than 2,300 mg (milligrams) of salt (sodium) a day. If you have hypertension, you may need to reduce your sodium intake to 1,500 mg a day.  Limit alcohol intake to no more than 1 drink a day for nonpregnant women and 2 drinks a day for men. One drink equals 12 oz of beer, 5 oz of wine, or 1 oz of hard liquor.  Work with your health care provider to maintain a healthy body weight or to lose weight. Ask what an ideal weight is for you.  Get at least 30 minutes of exercise that causes your heart to beat faster (aerobic exercise) most days of the week. Activities may include walking, swimming, or biking.  Work with your health care provider or diet and nutrition specialist (dietitian) to adjust your eating plan to your individual calorie needs. Reading food labels   Check food labels for the amount of sodium per serving. Choose foods with less than 5 percent of the Daily Value of sodium. Generally, foods with less than 300 mg of sodium per serving fit into this eating plan.  To find whole grains, look for the word "whole" as the first word in the ingredient list. Shopping  Buy products labeled as "low-sodium" or "no salt added."  Buy fresh foods. Avoid canned foods and premade or frozen meals. Cooking  Avoid adding salt when cooking. Use salt-free seasonings or herbs instead of table salt or sea salt. Check with your health care provider or pharmacist before using salt substitutes.  Do not fry foods. Cook foods using healthy methods such as baking, boiling, grilling, and broiling instead.  Cook with  heart-healthy oils, such as olive, canola, soybean, or sunflower oil. Meal planning  Eat a balanced diet that includes: ? 5 or more servings of fruits and vegetables each day. At each meal, try to fill half of your plate with fruits and vegetables. ? Up to 6-8 servings of whole grains each day. ? Less than 6 oz of lean meat, poultry, or fish each day. A 3-oz serving of meat is about the same size as a deck of cards. One egg equals 1 oz. ? 2 servings of low-fat dairy each day. ? A serving of nuts, seeds, or beans 5 times each week. ? Heart-healthy fats. Healthy fats called Omega-3 fatty acids are found in foods such as flaxseeds and coldwater fish, like sardines, salmon, and mackerel.  Limit how much you eat of the following: ? Canned or prepackaged foods. ? Food that is high in trans fat, such as fried foods. ? Food that is high in saturated fat, such as fatty meat. ? Sweets, desserts, sugary drinks, and other foods with added sugar. ? Full-fat dairy products.  Do not salt foods before eating.  Try to eat at least 2 vegetarian meals each week.  Eat more home-cooked food and less restaurant, buffet, and fast food.  When eating at a restaurant, ask that your food be prepared with less salt or no salt, if possible. What foods are recommended? The items listed may not be a complete list. Talk with your dietitian about   what dietary choices are best for you. Grains Whole-grain or whole-wheat bread. Whole-grain or whole-wheat pasta. Brown rice. Oatmeal. Quinoa. Bulgur. Whole-grain and low-sodium cereals. Pita bread. Low-fat, low-sodium crackers. Whole-wheat flour tortillas. Vegetables Fresh or frozen vegetables (raw, steamed, roasted, or grilled). Low-sodium or reduced-sodium tomato and vegetable juice. Low-sodium or reduced-sodium tomato sauce and tomato paste. Low-sodium or reduced-sodium canned vegetables. Fruits All fresh, dried, or frozen fruit. Canned fruit in natural juice (without  added sugar). Meat and other protein foods Skinless chicken or turkey. Ground chicken or turkey. Pork with fat trimmed off. Fish and seafood. Egg whites. Dried beans, peas, or lentils. Unsalted nuts, nut butters, and seeds. Unsalted canned beans. Lean cuts of beef with fat trimmed off. Low-sodium, lean deli meat. Dairy Low-fat (1%) or fat-free (skim) milk. Fat-free, low-fat, or reduced-fat cheeses. Nonfat, low-sodium ricotta or cottage cheese. Low-fat or nonfat yogurt. Low-fat, low-sodium cheese. Fats and oils Soft margarine without trans fats. Vegetable oil. Low-fat, reduced-fat, or light mayonnaise and salad dressings (reduced-sodium). Canola, safflower, olive, soybean, and sunflower oils. Avocado. Seasoning and other foods Herbs. Spices. Seasoning mixes without salt. Unsalted popcorn and pretzels. Fat-free sweets. What foods are not recommended? The items listed may not be a complete list. Talk with your dietitian about what dietary choices are best for you. Grains Baked goods made with fat, such as croissants, muffins, or some breads. Dry pasta or rice meal packs. Vegetables Creamed or fried vegetables. Vegetables in a cheese sauce. Regular canned vegetables (not low-sodium or reduced-sodium). Regular canned tomato sauce and paste (not low-sodium or reduced-sodium). Regular tomato and vegetable juice (not low-sodium or reduced-sodium). Pickles. Olives. Fruits Canned fruit in a light or heavy syrup. Fried fruit. Fruit in cream or butter sauce. Meat and other protein foods Fatty cuts of meat. Ribs. Fried meat. Bacon. Sausage. Bologna and other processed lunch meats. Salami. Fatback. Hotdogs. Bratwurst. Salted nuts and seeds. Canned beans with added salt. Canned or smoked fish. Whole eggs or egg yolks. Chicken or turkey with skin. Dairy Whole or 2% milk, cream, and half-and-half. Whole or full-fat cream cheese. Whole-fat or sweetened yogurt. Full-fat cheese. Nondairy creamers. Whipped toppings.  Processed cheese and cheese spreads. Fats and oils Butter. Stick margarine. Lard. Shortening. Ghee. Bacon fat. Tropical oils, such as coconut, palm kernel, or palm oil. Seasoning and other foods Salted popcorn and pretzels. Onion salt, garlic salt, seasoned salt, table salt, and sea salt. Worcestershire sauce. Tartar sauce. Barbecue sauce. Teriyaki sauce. Soy sauce, including reduced-sodium. Steak sauce. Canned and packaged gravies. Fish sauce. Oyster sauce. Cocktail sauce. Horseradish that you find on the shelf. Ketchup. Mustard. Meat flavorings and tenderizers. Bouillon cubes. Hot sauce and Tabasco sauce. Premade or packaged marinades. Premade or packaged taco seasonings. Relishes. Regular salad dressings. Where to find more information:  National Heart, Lung, and Blood Institute: www.nhlbi.nih.gov  American Heart Association: www.heart.org Summary  The DASH eating plan is a healthy eating plan that has been shown to reduce high blood pressure (hypertension). It may also reduce your risk for type 2 diabetes, heart disease, and stroke.  With the DASH eating plan, you should limit salt (sodium) intake to 2,300 mg a day. If you have hypertension, you may need to reduce your sodium intake to 1,500 mg a day.  When on the DASH eating plan, aim to eat more fresh fruits and vegetables, whole grains, lean proteins, low-fat dairy, and heart-healthy fats.  Work with your health care provider or diet and nutrition specialist (dietitian) to adjust your eating plan to your   individual calorie needs. This information is not intended to replace advice given to you by your health care provider. Make sure you discuss any questions you have with your health care provider. Document Revised: 01/14/2017 Document Reviewed: 01/26/2016 Elsevier Patient Education  2020 Else[employer] Inc.  

## 2019-10-24 ENCOUNTER — Other Ambulatory Visit: Payer: Self-pay

## 2019-10-24 MED ORDER — LOSARTAN POTASSIUM 50 MG PO TABS
25.0000 mg | ORAL_TABLET | Freq: Every day | ORAL | 4 refills | Status: DC
Start: 2019-10-24 — End: 2020-10-27

## 2019-10-24 NOTE — Telephone Encounter (Signed)
Last seen 10/12/19. Last RX sent in did not get to the pharmacy, it said no print. Updated on this RX.

## 2019-10-26 ENCOUNTER — Other Ambulatory Visit: Payer: Self-pay | Admitting: Otolaryngology

## 2019-10-26 DIAGNOSIS — E041 Nontoxic single thyroid nodule: Secondary | ICD-10-CM

## 2019-10-30 ENCOUNTER — Ambulatory Visit
Admission: RE | Admit: 2019-10-30 | Discharge: 2019-10-30 | Disposition: A | Discharge status: Home / Self Care | Payer: Medicare Other | Source: Ambulatory Visit | Attending: Otolaryngology | Admitting: Otolaryngology

## 2019-10-30 ENCOUNTER — Other Ambulatory Visit: Payer: Self-pay

## 2019-10-30 DIAGNOSIS — E041 Nontoxic single thyroid nodule: Secondary | ICD-10-CM | POA: Diagnosis not present

## 2019-10-31 ENCOUNTER — Other Ambulatory Visit: Payer: Self-pay | Admitting: Otolaryngology

## 2019-10-31 DIAGNOSIS — E041 Nontoxic single thyroid nodule: Secondary | ICD-10-CM

## 2019-11-01 ENCOUNTER — Other Ambulatory Visit: Payer: Self-pay | Admitting: Otolaryngology

## 2019-11-01 DIAGNOSIS — E041 Nontoxic single thyroid nodule: Secondary | ICD-10-CM

## 2019-11-27 ENCOUNTER — Encounter: Payer: Self-pay | Admitting: Nurse Practitioner

## 2019-11-27 ENCOUNTER — Other Ambulatory Visit: Payer: Self-pay

## 2019-11-27 ENCOUNTER — Ambulatory Visit (INDEPENDENT_AMBULATORY_CARE_PROVIDER_SITE_OTHER): Payer: Medicare Other | Admitting: Nurse Practitioner

## 2019-11-27 VITALS — BP 118/76 | HR 60 | Temp 98.8°F | Ht 63.0 in | Wt 156.0 lb

## 2019-11-27 DIAGNOSIS — Z23 Encounter for immunization: Secondary | ICD-10-CM | POA: Diagnosis not present

## 2019-11-27 DIAGNOSIS — R7309 Other abnormal glucose: Secondary | ICD-10-CM | POA: Diagnosis not present

## 2019-11-27 DIAGNOSIS — R7989 Other specified abnormal findings of blood chemistry: Secondary | ICD-10-CM | POA: Diagnosis not present

## 2019-11-27 DIAGNOSIS — E782 Mixed hyperlipidemia: Secondary | ICD-10-CM

## 2019-11-27 DIAGNOSIS — I10 Essential (primary) hypertension: Secondary | ICD-10-CM

## 2019-11-27 NOTE — Assessment & Plan Note (Signed)
Chronic, ongoing.  Continues every other day statin which has decreased side effects.  Lipid panel up to date, LDL 82. Adjust dose as needed.

## 2019-11-27 NOTE — Progress Notes (Signed)
BP 118/76 (BP Location: Left Arm, Patient Position: Sitting, Cuff Size: Normal)   Pulse 60 Comment: apical  Temp 98.8 F (37.1 C) (Oral)   Ht 5\' 3"  (1.6 m)   Wt 156 lb (70.8 kg)   LMP  (LMP Unknown)   SpO2 99%   BMI 27.63 kg/m    Subjective:    Patient ID: , female    DOB: March 14, 1950, 69 y.o.   MRN: [id number]  HPI: Carol [name] is a 69 y.o. female  Chief Complaint  Patient presents with  . Diabetes  . Hypertension  . Hyperlipidemia   HYPERTENSION / HYPERLIPIDEMIA Continues on Losartan 25 MG daily.  Continues on Lipitor every other day. Has history of elevation in TSH, but recent levels have remained normal -- TSH in May 3.630.  Had thyroid ultrasound on 10/30/19 -- multinodular goiter noted -- ordered by ENT.  History of elevation in A1C, 6% in May, no current symptoms and focused on diet.  Wishes to hold off on labs today. Satisfied with current treatment? yes Duration of hypertension: chronic BP monitoring frequency: not checking BP range:  BP medication side effects: no Duration of hyperlipidemia: chronic Cholesterol medication side effects: no Cholesterol supplements: none Medication compliance: good compliance Aspirin: yes Recent stressors: no Recurrent headaches: no Visual changes: no Palpitations: no Dyspnea: no Chest pain: no Lower extremity edema: no Dizzy/lightheaded: no  Relevant past medical, surgical, family and social history reviewed and updated as indicated. Interim medical history since our last visit reviewed. Allergies and medications reviewed and updated.  Review of Systems  Constitutional: Negative for activity change, appetite change, diaphoresis, fatigue and fever.  Respiratory: Negative for cough, chest tightness and shortness of breath.   Cardiovascular: Negative for chest pain, palpitations and leg swelling.  Gastrointestinal: Negative.   Neurological: Negative.   Psychiatric/Behavioral: Negative.    Per HPI unless  specifically indicated above     Objective:    BP 118/76 (BP Location: Left Arm, Patient Position: Sitting, Cuff Size: Normal)   Pulse 60 Comment: apical  Temp 98.8 F (37.1 C) (Oral)   Ht 5\' 3"  (1.6 m)   Wt 156 lb (70.8 kg)   LMP  (LMP Unknown)   SpO2 99%   BMI 27.63 kg/m   Wt Readings from Last 3 Encounters:  11/27/19 156 lb (70.8 kg)  10/12/19 156 lb (70.8 kg)  10/04/19 156 lb (70.8 kg)    Physical Exam Vitals and nursing note reviewed.  Constitutional:      General: She is awake. She is not in acute distress.    Appearance: She is well-developed and overweight. She is not ill-appearing.  HENT:     Head: Normocephalic.     Right Ear: Hearing, tympanic membrane, ear canal and external ear normal.     Left Ear: Hearing, tympanic membrane, ear canal and external ear normal.  Eyes:     General: Lids are normal.        Right eye: No discharge.        Left eye: No discharge.     Conjunctiva/sclera: Conjunctivae normal.     Pupils: Pupils are equal, round, and reactive to light.  Neck:     Vascular: No carotid bruit.  Cardiovascular:     Rate and Rhythm: Normal rate and regular rhythm.     Heart sounds: Normal heart sounds. No murmur heard.  No gallop.   Pulmonary:     Effort: Pulmonary effort is normal. No accessory muscle usage  or respiratory distress.     Breath sounds: Normal breath sounds.  Abdominal:     General: Bowel sounds are normal.     Palpations: Abdomen is soft.  Musculoskeletal:     Cervical back: Normal range of motion and neck supple.     Right lower leg: No edema.     Left lower leg: No edema.  Skin:    General: Skin is warm and dry.  Neurological:     Mental Status: She is alert and oriented to person, place, and time.  Psychiatric:        Attention and Perception: Attention normal.        Mood and Affect: Mood normal.        Speech: Speech normal.        Behavior: Behavior normal. Behavior is cooperative.        Thought Content: Thought  content normal.    Results for orders placed or performed during the hospital encounter of 10/02/19  SARS CORONAVIRUS 2 (TAT 6-24 HRS) Nasopharyngeal Nasopharyngeal Swab   Specimen: Nasopharyngeal Swab  Result Value Ref Range   SARS Coronavirus 2 NEGATIVE NEGATIVE      Assessment & Plan:   Problem List Items Addressed This Visit      Cardiovascular and Mediastinum   Hypertension - Primary    Chronic, ongoing. BP in office today below goal.  She does have increased stressors lately and is not checking at home due to this.  Recommend she monitor at least a few times a week at home.  Continue Losartan 25 MG daily and adjust as needed -- she did not tolerate increase in this dose to 50 MG in past and had dizziness.   Focus on DASH diet at home.  Return to office in 2 months for BP check and reassurance.        Relevant Orders   Basic metabolic panel     Other   Hyperlipidemia    Chronic, ongoing.  Continues every other day statin which has decreased side effects.  Lipid panel up to date, LDL 82. Adjust dose as needed.      Relevant Orders   Lipid Panel w/o Chol/HDL Ratio   Elevated TSH    Recheck TSH and Free T4, no current symptoms.  Review biopsy and ENT notes once available.      Relevant Orders   TSH   T4, free   Elevated hemoglobin A1c    Recheck A1C today and change plan of care as needed.      Relevant Orders   HgB A1c    Other Visit Diagnoses    Flu vaccine need       Relevant Orders   Flu Vaccine QUAD High Dose(Fluad)       Follow up plan: Return in about 2 months (around 01/27/2020) for HTN/HLD, Prediabetes.

## 2019-11-27 NOTE — Assessment & Plan Note (Signed)
Chronic, ongoing. BP in office today below goal.  She does have increased stressors lately and is not checking at home due to this.  Recommend she monitor at least a few times a week at home.  Continue Losartan 25 MG daily and adjust as needed -- she did not tolerate increase in this dose to 50 MG in past and had dizziness.   Focus on DASH diet at home.  Return to office in 2 months for BP check and reassurance.

## 2019-11-27 NOTE — Patient Instructions (Signed)
Thyroid Nodule  A thyroid nodule is an isolated growth of thyroid cells that forms a lump in your thyroid gland. The thyroid gland is a butterfly-shaped gland. It is found in the lower front of your neck. This gland sends chemical messengers (hormones) through your blood to all parts of your body. These hormones are important in regulating your body temperature and helping your body to use energy. Thyroid nodules are common. Most are not cancerous (benign). You may have one nodule or several nodules. Different types of thyroid nodules include nodules that:  Grow and fill with fluid (thyroid cysts).  Produce too much thyroid hormone (hot nodules or hyperthyroid).  Produce no thyroid hormone (cold nodules or hypothyroid).  Form from cancer cells (thyroid cancers). What are the causes? In most cases, the cause of this condition is not known. What increases the risk? The following factors may make you more likely to develop this condition.  Age. Thyroid nodules become more common in people who are older than 68 years of age.  Gender. ? Benign thyroid nodules are more common in women. ? Cancerous (malignant) thyroid nodules are more common in men.  A family history that includes: ? Thyroid nodules. ? Pheochromocytoma. ? Thyroid carcinoma. ? Hyperparathyroidism.  Certain kinds of thyroid diseases, such as Hashimoto's thyroiditis.  Lack of iodine in your diet.  A history of head and neck radiation, such as from previous cancer treatment. What are the signs or symptoms? In many cases, there are no symptoms. If you have symptoms, they may include:  A lump in your lower neck.  Feeling a lump or tickle in your throat.  Pain in your neck, jaw, or ear.  Having trouble swallowing. Hot nodules may cause symptoms that include:  Weight loss.  Warm, flushed skin.  Feeling hot.  Feeling nervous.  A racing heartbeat. Cold nodules may cause symptoms that include:  Weight  gain.  Dry skin.  Brittle hair. This may also occur with hair loss.  Feeling cold.  Fatigue. Thyroid cancer nodules may cause symptoms that include:  Hard nodules that feel stuck to the thyroid gland.  Hoarseness.  Lumps in the glands near your thyroid (lymph nodes). How is this diagnosed? A thyroid nodule may be felt by your health care provider during a physical exam. This condition may also be diagnosed based on your symptoms. You may also have tests, including:  An ultrasound. This may be done to confirm the diagnosis.  A biopsy. This involves taking a sample from the nodule and looking at it under a microscope.  Blood tests to make sure that your thyroid is working properly.  A thyroid scan. This test uses a radioactive tracer injected into a vein to create an image of the thyroid gland on a computer screen.  Imaging tests such as MRI or CT scan. These may be done if: ? Your nodule is large. ? Your nodule is blocking your airway. ? Cancer is suspected. How is this treated? Treatment depends on the cause and size of your nodule or nodules. If the nodule is benign, treatment may not be necessary. Your health care provider may monitor the nodule to see if it goes away without treatment. If the nodule continues to grow, is cancerous, or does not go away, treatment may be needed. Treatment may include:  Having a cystic nodule drained with a needle.  Ablation therapy. In this treatment, alcohol is injected into the area of the nodule to destroy the cells. Ablation with heat (  thermal ablation) may also be used.  Radioactive iodine. In this treatment, radioactive iodine is given as a pill or liquid that you drink. This substance causes the thyroid nodule to shrink.  Surgery to remove the nodule. Part or all of your thyroid gland may need to be removed as well.  Medicines. Follow these instructions at home:  Pay attention to any changes in your nodule.  Take  over-the-counter and prescription medicines only as told by your health care provider.  Keep all follow-up visits as told by your health care provider. This is important. Contact a health care provider if:  Your voice changes.  You have trouble swallowing.  You have pain in your neck, ear, or jaw that is getting worse.  Your nodule gets bigger.  Your nodule starts to make it harder for you to breathe.  Your muscles look like they are shrinking (muscle wasting). Get help right away if:  You have chest pain.  There is a loss of consciousness.  You have a sudden fever.  You feel confused.  You are seeing or hearing things that other people do not see or hear (having hallucinations).  You feel very weak.  You have mood swings.  You feel very restless.  You feel suddenly nauseous or throw up.  You suddenly have diarrhea. Summary  A thyroid nodule is an isolated growth of thyroid cells that forms a lump in your thyroid gland.  Thyroid nodules are common. Most are not cancerous (benign). You may have one nodule or several nodules.  Treatment depends on the cause and size of your nodule or nodules. If the nodule is benign, treatment may not be necessary.  Your health care provider may monitor the nodule to see if it goes away without treatment. If the nodule continues to grow, is cancerous, or does not go away, treatment may be needed. This information is not intended to replace advice given to you by your health care provider. Make sure you discuss any questions you have with your health care provider. Document Revised: 09/16/2017 Document Reviewed: 09/19/2017 Elsevier Patient Education  2020 [employer]  

## 2019-11-27 NOTE — Assessment & Plan Note (Signed)
Recheck A1C today and change plan of care as needed.

## 2019-11-27 NOTE — Assessment & Plan Note (Signed)
Recheck TSH and Free T4, no current symptoms.  Review biopsy and ENT notes once available.

## 2019-11-28 LAB — BASIC METABOLIC PANEL
BUN/Creatinine Ratio: 14 (ref 12–28)
BUN: 12 mg/dL (ref 8–27)
CO2: 26 mmol/L (ref 20–29)
Calcium: 9.7 mg/dL (ref 8.7–10.3)
Chloride: 102 mmol/L (ref 96–106)
Creatinine, Ser: 0.84 mg/dL (ref 0.57–1.00)
GFR calc Af Amer: 83 mL/min/{1.73_m2} (ref >59)
GFR calc non Af Amer: 72 mL/min/{1.73_m2} (ref >59)
Glucose: 108 mg/dL — ABNORMAL HIGH (ref 65–99)
Potassium: 4 mmol/L (ref 3.5–5.2)
Sodium: 140 mmol/L (ref 134–144)

## 2019-11-28 LAB — LIPID PANEL W/O CHOL/HDL RATIO
Cholesterol, Total: 156 mg/dL (ref 100–199)
HDL: 41 mg/dL (ref >39)
LDL Chol Calc (NIH): 97 mg/dL (ref 0–99)
Triglycerides: 97 mg/dL (ref 0–149)
VLDL Cholesterol Cal: 18 mg/dL (ref 5–40)

## 2019-11-28 LAB — HEMOGLOBIN A1C
Est. average glucose Bld gHb Est-mCnc: 131 mg/dL
Hgb A1c MFr Bld: 6.2 % — ABNORMAL HIGH (ref 4.8–5.6)

## 2019-11-28 LAB — TSH: TSH: 4.13 u[IU]/mL (ref 0.450–4.500)

## 2019-11-28 LAB — T4, FREE: Free T4: 1.14 ng/dL (ref 0.82–1.77)

## 2019-11-28 NOTE — Progress Notes (Signed)
Please let Carol [name] know her labs have returned, thyroid is normal.  Kidney function normal.  Cholesterol levels continue to be stable with medication.  A1C continues in prediabetic stage, continue focus on diet and exercise.  Have a good day!! Keep being awesome!!  Thank you for allowing me to participate in your care. Kindest regards, Mayah Urquidi

## 2019-11-30 ENCOUNTER — Other Ambulatory Visit: Payer: Self-pay

## 2019-11-30 ENCOUNTER — Ambulatory Visit
Admission: RE | Admit: 2019-11-30 | Discharge: 2019-11-30 | Disposition: A | Discharge status: Home / Self Care | Payer: Medicare Other | Source: Ambulatory Visit | Attending: Otolaryngology | Admitting: Otolaryngology

## 2019-11-30 DIAGNOSIS — E041 Nontoxic single thyroid nodule: Secondary | ICD-10-CM | POA: Insufficient documentation

## 2019-11-30 NOTE — Procedures (Signed)
Technically successful US guided biopsy of dominant right superior lobe thyroid nodule.  No immediate complications.

## 2019-12-04 LAB — CYTOLOGY - NON PAP

## 2019-12-11 ENCOUNTER — Ambulatory Visit: Payer: Self-pay | Admitting: General Practice

## 2019-12-11 ENCOUNTER — Telehealth: Payer: Self-pay | Admitting: General Practice

## 2019-12-11 DIAGNOSIS — E782 Mixed hyperlipidemia: Secondary | ICD-10-CM

## 2019-12-11 DIAGNOSIS — I10 Essential (primary) hypertension: Secondary | ICD-10-CM

## 2019-12-11 DIAGNOSIS — F419 Anxiety disorder, unspecified: Secondary | ICD-10-CM

## 2019-12-11 DIAGNOSIS — F439 Reaction to severe stress, unspecified: Secondary | ICD-10-CM

## 2019-12-11 NOTE — Patient Instructions (Signed)
Visit Information  Goals Addressed              This Visit's Progress   .  RNCM: "Hospice has come in to help [name]" (pt-stated)        CARE PLAN ENTRY (see longitudinal plan of care for additional care plan information)  Current Barriers:  [name] Knowledge Deficits related to stress and depression over the patients husbands  recent diagnosis of cancer with recommendations of comfort care  . Care Coordination needs related to nutritional resources and help with utilities  in a patient with HTN/HLD and stress due to husbands terminal illness (disease states) . Lacks caregiver support.  . Corporate treasurer.   Nurse Case Manager Clinical Goal(s):  [name] Over the next 120 days, patient will work with RNCM, pcp and CCM team to address needs related to stress reduction and needs in the home due to the recent terminal diagnosis of her husband  . Over the next 120 days, patient will attend all scheduled medical appointments: next appointment with pcp on 07/13/2019 . Over the next 120 days, patient will work with CM clinical social worker to help with coping and grief of husbands terminal diagnosis and other needs related to financial constraints . Over the next 120 days, patient will work with care guides (community agency) to assist with food resources such as [employer] and resources to help with the high utility bills.   Interventions:  . Inter-disciplinary care team collaboration (see longitudinal plan of care) . Evaluation of current treatment plan related to stress/anxiety/depression and patient's adherence to plan as established by provider.  The patient states that she is maintaining her health and well being.  Education on the importance of self care when taking care of spouse. 12-11-2019: Evaluation of stress and anxiety. The patient states she is doing okay. She says that yesterday was a bad day for [facility] and the nurse had to come out. She was stressed out about that but he is  better today. She goes at 2 pm to the provider today for follow up results for thyroid biopsy she had on 11-30-2019.  She says this is a yearly follow up as they are watching a nodule on her thyroid.  . Advised patient to call the pcp and CCM team for questions or concerns . Provided education to patient re: community resources to help with food and other resources and to expect a call from the care guide team. Education on referral to the care guides for help with food resources. 12-11-2019: the patient declined [employer] at this time. Is getting food from a local church and will let the RNCM know if there are changes.  . Reviewed scheduled/upcoming provider appointments including: upcoming appointment on 01-28-2020 with pcp, just saw the pcp on 11-27-2019.  Knows she can call sooner if needed for any new concerns or needs.  . Social Work referral for coping/grief mechanisms related to new terminal illness diagnosis in the patients husband with recommendations for comfort care. Hospice is coming into the home.   Patient Self Care Activities:  . Patient verbalizes understanding of plan to work with the CCM team to meet her health and wellness needs  . Attends all scheduled provider appointments . Calls provider office for new concerns or questions . Unable to independently manage stress/anxiety/depression  Please see past updates related to this goal by clicking on the "Past Updates" button in the selected goal      .  RNCM: Pt-"I  am not checking my blood pressure at home it freaks me out" (pt-stated)        CARE PLAN ENTRY (see longtitudinal plan of care for additional care plan information)  Current Barriers:  . Chronic Disease Management support, education, and care coordination needs related to HTN and HLD  Clinical Goal(s) related to HTN and HLD:  Over the next 120 days, patient will:  . Work with the care management team to address educational, disease management, and care  coordination needs  . Begin or continue self health monitoring activities as directed today  adhere to a heart healthy diet and monitor blood pressure if instructed by pcp . Call provider office for new or worsened signs and symptoms New or worsened symptom related to HTN/HLD . Call care management team with questions or concerns . Verbalize basic understanding of patient centered plan of care established today  Interventions related to HTN and HLD:  . Evaluation of current treatment plans and patient's adherence to plan as established by provider.  Has upcoming appointments to see pcp. Next appointment on 01-28-2020.  . Assessed patient understanding of disease states.  The patient verbalized understanding of her conditions. Discussed taking care of self and needs.  The patient has stressors related to being the caregiver of her elderly husband. 12-11-2019: The patient is doing good today. BP was good recently in the office when seeing the pcp. She does not check at home due to the stress of taking care of her "sick" husband. She states she is eating good and resting. Some nights she does not sleep as well as others depends on how things are going with her husband. Review of help in the home. Hospice comes in but she does not have any additional help.  . Assessed patient's education and care coordination needs.  The patient expressed that they did not qualify for food stamps. The patient is interested on [employer] or food resources in the area. Will do a care guide referral for food resources. 12-11-2019: Has adequate food resources at this time.  . Provided disease specific education to patient.  Education on taking blood pressures and recording. The patient states her blood pressure was good during her procedure because if it had been high she would not have been able to have to procedure. The patient will try to take it at home and record. 12-11-2019: The patient is not taking but has it  checked regularly at MD appointments. Sees specialist today for thyroid biopsy and follow up.  Steele Sizer with appropriate clinical care team members regarding patient needs.  The patient knows about the LCSW and Pharm D on the team. The patient denies any other needs.  . Evaluation of upcoming appointments. Sees pcp on 01-28-2020 and will follow up with the specialist on 12-11-2019 for results of thyroid biopsy taken on 11-30-2019.   Patient Self Care Activities related to HTN and HLD:  . Patient is unable to independently self-manage chronic health conditions  Please see past updates related to this goal by clicking on the "Past Updates" button in the selected goal         Patient verbalizes understanding of instructions provided today.   Telephone follow up appointment with care management team member scheduled for: 02-05-2020 at 4:15 pm  Alto Denver RN, MSN, CCM Community Care Coordinator [place]  [facility] Mobile: [phone]

## 2019-12-11 NOTE — Chronic Care Management (AMB) (Signed)
Chronic Care Management   Follow Up Note   12/11/2019 Name: Carol [name] MRN: [id number] DOB: 1950/02/26  Referred by: Marjie Skiff, NP Reason for referral : Chronic Care Management (RNCM Follow up for Chronic Disease Management and Care Coordination Needs)   Carol [name] is a 69 y.o. year old female who is a primary care patient of Cannady, Dorie Rank, NP. The CCM team was consulted for assistance with chronic disease management and care coordination needs.    Review of patient status, including review of consultants reports, relevant laboratory and other test results, and collaboration with appropriate care team members and the patient's provider was performed as part of comprehensive patient evaluation and provision of chronic care management services.    SDOH (Social Determinants of Health) assessments performed: Yes See Care Plan activities for detailed interventions related to [facility])     Outpatient Encounter Medications as of 12/11/2019  Medication Sig  . aspirin 81 MG tablet Take 81 mg by mouth daily.  [name] atorvastatin (LIPITOR) 10 MG tablet Take one tablet (10 MG) by mouth every other day.  . fluticasone (FLONASE) 50 MCG/ACT nasal spray SPRAY 2 SPRAYS INTO EACH NOSTRIL ONCE A DAY  . loratadine (CLARITIN) 10 MG tablet Take 10 mg by mouth daily.  [name] losartan (COZAAR) 50 MG tablet Take 0.5 tablets (25 mg total) by mouth daily.  . meclizine (ANTIVERT) 25 MG tablet Take 1 tablet (25 mg total) by mouth 3 (three) times daily as needed for dizziness.  . meloxicam (MOBIC) 15 MG tablet Take 15 mg by mouth as needed for pain.   No facility-administered encounter medications on file as of 12/11/2019.     Objective:  BP Readings from Last 3 Encounters:  11/27/19 118/76  10/12/19 128/80  10/04/19 (!) 155/85    Goals Addressed              This Visit's Progress   .  RNCM: "Hospice has come in to help [name]" (pt-stated)        CARE PLAN ENTRY (see longitudinal plan of  care for additional care plan information)  Current Barriers:  [name] Knowledge Deficits related to stress and depression over the patients husbands  recent diagnosis of cancer with recommendations of comfort care  . Care Coordination needs related to nutritional resources and help with utilities  in a patient with HTN/HLD and stress due to husbands terminal illness (disease states) . Lacks caregiver support.  . Corporate treasurer.   Nurse Case Manager Clinical Goal(s):  [name] Over the next 120 days, patient will work with RNCM, pcp and CCM team to address needs related to stress reduction and needs in the home due to the recent terminal diagnosis of her husband  . Over the next 120 days, patient will attend all scheduled medical appointments: next appointment with pcp on 07/13/2019 . Over the next 120 days, patient will work with CM clinical social worker to help with coping and grief of husbands terminal diagnosis and other needs related to financial constraints . Over the next 120 days, patient will work with care guides (community agency) to assist with food resources such as [employer] and resources to help with the high utility bills.   Interventions:  . Inter-disciplinary care team collaboration (see longitudinal plan of care) . Evaluation of current treatment plan related to stress/anxiety/depression and patient's adherence to plan as established by provider.  The patient states that she is maintaining her health and well being.  Education on  the importance of self care when taking care of spouse. 12-11-2019: Evaluation of stress and anxiety. The patient states she is doing okay. She says that yesterday was a bad day for [facility] and the nurse had to come out. She was stressed out about that but he is better today. She goes at 2 pm to the provider today for follow up results for thyroid biopsy she had on 11-30-2019.  She says this is a yearly follow up as they are watching a nodule on her  thyroid.  . Advised patient to call the pcp and CCM team for questions or concerns . Provided education to patient re: community resources to help with food and other resources and to expect a call from the care guide team. Education on referral to the care guides for help with food resources. 12-11-2019: the patient declined [employer] at this time. Is getting food from a local church and will let the RNCM know if there are changes.  . Reviewed scheduled/upcoming provider appointments including: upcoming appointment on 01-28-2020 with pcp, just saw the pcp on 11-27-2019.  Knows she can call sooner if needed for any new concerns or needs.  . Social Work referral for coping/grief mechanisms related to new terminal illness diagnosis in the patients husband with recommendations for comfort care. Hospice is coming into the home.   Patient Self Care Activities:  . Patient verbalizes understanding of plan to work with the CCM team to meet her health and wellness needs  . Attends all scheduled provider appointments . Calls provider office for new concerns or questions . Unable to independently manage stress/anxiety/depression  Please see past updates related to this goal by clicking on the "Past Updates" button in the selected goal      .  RNCM: Pt-"I am not checking my blood pressure at home it freaks me out" (pt-stated)        CARE PLAN ENTRY (see longtitudinal plan of care for additional care plan information)  Current Barriers:  . Chronic Disease Management support, education, and care coordination needs related to HTN and HLD  Clinical Goal(s) related to HTN and HLD:  Over the next 120 days, patient will:  . Work with the care management team to address educational, disease management, and care coordination needs  . Begin or continue self health monitoring activities as directed today  adhere to a heart healthy diet and monitor blood pressure if instructed by pcp . Call provider  office for new or worsened signs and symptoms New or worsened symptom related to HTN/HLD . Call care management team with questions or concerns . Verbalize basic understanding of patient centered plan of care established today  Interventions related to HTN and HLD:  . Evaluation of current treatment plans and patient's adherence to plan as established by provider.  Has upcoming appointments to see pcp. Next appointment on 01-28-2020.  . Assessed patient understanding of disease states.  The patient verbalized understanding of her conditions. Discussed taking care of self and needs.  The patient has stressors related to being the caregiver of her elderly husband. 12-11-2019: The patient is doing good today. BP was good recently in the office when seeing the pcp. She does not check at home due to the stress of taking care of her "sick" husband. She states she is eating good and resting. Some nights she does not sleep as well as others depends on how things are going with her husband. Review of help in the home. Hospice  comes in but she does not have any additional help.  . Assessed patient's education and care coordination needs.  The patient expressed that they did not qualify for food stamps. The patient is interested on [employer] or food resources in the area. Will do a care guide referral for food resources. 12-11-2019: Has adequate food resources at this time.  . Provided disease specific education to patient.  Education on taking blood pressures and recording. The patient states her blood pressure was good during her procedure because if it had been high she would not have been able to have to procedure. The patient will try to take it at home and record. 12-11-2019: The patient is not taking but has it checked regularly at MD appointments. Sees specialist today for thyroid biopsy and follow up.  Steele Sizer with appropriate clinical care team members regarding patient needs.  The patient  knows about the LCSW and Pharm D on the team. The patient denies any other needs.  . Evaluation of upcoming appointments. Sees pcp on 01-28-2020 and will follow up with the specialist on 12-11-2019 for results of thyroid biopsy taken on 11-30-2019.   Patient Self Care Activities related to HTN and HLD:  . Patient is unable to independently self-manage chronic health conditions  Please see past updates related to this goal by clicking on the "Past Updates" button in the selected goal          Plan:   Telephone follow up appointment with care management team member scheduled for: 02-05-2020 at 4:15 pm   Alto Denver RN, MSN, CCM Community Care Coordinator [place]  [facility] Mobile: [phone]

## 2019-12-19 ENCOUNTER — Telehealth: Payer: Self-pay

## 2019-12-24 ENCOUNTER — Ambulatory Visit: Payer: Self-pay

## 2019-12-24 ENCOUNTER — Ambulatory Visit (INDEPENDENT_AMBULATORY_CARE_PROVIDER_SITE_OTHER): Payer: Medicare Other | Admitting: Nurse Practitioner

## 2019-12-24 ENCOUNTER — Encounter: Payer: Self-pay | Admitting: Nurse Practitioner

## 2019-12-24 ENCOUNTER — Other Ambulatory Visit: Payer: Self-pay

## 2019-12-24 DIAGNOSIS — R42 Dizziness and giddiness: Secondary | ICD-10-CM | POA: Diagnosis not present

## 2019-12-24 MED ORDER — MECLIZINE HCL 25 MG PO TABS: 25.0000 mg | ORAL_TABLET | Freq: Three times a day (TID) | ORAL | 4 refills | Status: AC | PRN

## 2019-12-24 NOTE — Assessment & Plan Note (Signed)
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  Normal neuro exam today.  Refills sent on Meclizine, which has benefited her in past.  Recommend use of compression hose during day at home and off at night.  Ensure increased fluid intake during daytime.  Return to office as scheduled or for worsening/ongoing symptoms.

## 2019-12-24 NOTE — Progress Notes (Signed)
BP 118/62   Pulse 62   Temp 97.9 F (36.6 C) (Oral)   Ht 5' 2.36" (1.584 m)   Wt 157 lb 6.4 oz (71.4 kg)   LMP  (LMP Unknown)   SpO2 96%   BMI 28.46 kg/m    Subjective:    Patient ID: Carol [name], female    DOB: 04/26/1950, 69 y.o.   MRN: [id number]  HPI: Carol [name] is a 69 y.o. female  Chief Complaint  Patient presents with  . Dizziness    Started today   DIZZINESS Started today when she woke-up.  Has history of similar and has Meclizine for this, but it is outdated 2018.  Needs new refill, she reports this works well for her.  Denies any CP, SOB, or LOC.  Is doing a lot of care for her husband who is on hospice. Duration: days Description of symptoms: ill-defined Duration of episode: seconds Dizziness frequency: recurrent Provoking factors: none Aggravating factors:  none Triggered by rolling over in bed: no Triggered by bending over: yes Aggravated by head movement: no Aggravated by exertion, coughing, loud noises: no Recent head injury: no Recent or current viral symptoms: no History of vasovagal episodes: no Nausea: no Vomiting: no Tinnitus: no Hearing loss: no Aural fullness: no Headache: no Photophobia/phonophobia: no Unsteady gait: no Postural instability: no Diplopia, dysarthria, dysphagia or weakness: no Related to exertion: no Pallor: no Diaphoresis: no Dyspnea: no Chest pain: no  Relevant past medical, surgical, family and social history reviewed and updated as indicated. Interim medical history since our last visit reviewed. Allergies and medications reviewed and updated.  Review of Systems  Constitutional: Negative for activity change, appetite change, diaphoresis, fatigue and fever.  Respiratory: Negative for cough, chest tightness and shortness of breath.   Cardiovascular: Negative for chest pain, palpitations and leg swelling.  Gastrointestinal: Negative.   Neurological: Positive for dizziness. Negative for syncope, facial  asymmetry, speech difficulty, weakness, light-headedness, numbness and headaches.  Psychiatric/Behavioral: Negative.     Per HPI unless specifically indicated above     Objective:    BP 118/62   Pulse 62   Temp 97.9 F (36.6 C) (Oral)   Ht 5' 2.36" (1.584 m)   Wt 157 lb 6.4 oz (71.4 kg)   LMP  (LMP Unknown)   SpO2 96%   BMI 28.46 kg/m   Wt Readings from Last 3 Encounters:  12/24/19 157 lb 6.4 oz (71.4 kg)  11/27/19 156 lb (70.8 kg)  10/12/19 156 lb (70.8 kg)    Physical Exam Vitals and nursing note reviewed.  Constitutional:      General: She is awake. She is not in acute distress.    Appearance: She is well-developed and overweight. She is not ill-appearing.  HENT:     Head: Normocephalic.     Right Ear: Hearing, tympanic membrane, ear canal and external ear normal.     Left Ear: Hearing, tympanic membrane, ear canal and external ear normal.  Eyes:     General: Lids are normal.        Right eye: No discharge.        Left eye: No discharge.     Extraocular Movements: Extraocular movements intact.     Conjunctiva/sclera: Conjunctivae normal.     Pupils: Pupils are equal, round, and reactive to light.     Visual Fields: Right eye visual fields normal and left eye visual fields normal.  Neck:     Vascular: No carotid bruit.  Cardiovascular:  Rate and Rhythm: Normal rate and regular rhythm.     Heart sounds: Normal heart sounds. No murmur heard.  No gallop.   Pulmonary:     Effort: Pulmonary effort is normal. No accessory muscle usage or respiratory distress.     Breath sounds: Normal breath sounds.  Abdominal:     General: Bowel sounds are normal.     Palpations: Abdomen is soft.  Musculoskeletal:     Cervical back: Normal range of motion and neck supple.     Right lower leg: No edema.     Left lower leg: No edema.  Skin:    General: Skin is warm and dry.  Neurological:     Mental Status: She is alert and oriented to person, place, and time.     Cranial  Nerves: Cranial nerves are intact.     Motor: Motor function is intact.     Coordination: Coordination is intact.     Gait: Gait is intact.     Deep Tendon Reflexes: Reflexes are normal and symmetric.     Reflex Scores:      Brachioradialis reflexes are 2+ on the right side and 2+ on the left side.      Patellar reflexes are 2+ on the right side and 2+ on the left side. Psychiatric:        Attention and Perception: Attention normal.        Mood and Affect: Mood normal.        Speech: Speech normal.        Behavior: Behavior normal. Behavior is cooperative.        Thought Content: Thought content normal.     Results for orders placed or performed during the hospital encounter of 11/30/19  Cytology - Non PAP;  Result Value Ref Range   CYTOLOGY - NON GYN      CYTOLOGY - NON PAP CASE: [id number] PATIENT: Carol [name] Non-Gynecological Cytology Report     Specimen Submitted: A. Right thyroid  Clinical History: None provided      DIAGNOSIS: A. RIGHT THYROID, ULTRASOUND-GUIDED FINE NEEDLE ASPIRATION: - BENIGN (BETHESDA DIAGNOSTIC CATEGORY II).  Comment: Smears and ThinPrep demonstrate abundant polymorphic lymphoid cells with only rare follicular epithelial cells.  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  If clinical concern for a thyroid neoplasm persists, additional sampling may be necessary.   GROSS DESCRIPTION: A. Site: Right thyroid Procedure: Ultrasound-guided FNA Cytotechnologist(s): Mercy Riding  Specimen material collected and submitted for: 6 diff Quik stained slides 6 Pap stained slides Material for ThyroSeq if needed, labeled with the patients name, date of birth, specimen site, and collection date ThinPrep: Yes, 1  Specimen description:  Fixative: Cytoloyt Volume: 25 mL Color: Red Transparency: Clear Tissue fragments present: Yes   Final Diagnosis performed by Georgeanna Harrison, MD.    Electronically signed 12/04/2019 4:07:46PM The electronic signature indicates that the named Attending Pathologist has evaluated the specimen Technical component performed at [facility], [address], [place], [place] [address] Lab: [phone] Dir: Alexx Giambra Schimke, MD, MMM  Professional component performed at [facility], [facility], [address] [place], [place], [place] [address] Lab: [phone] Dir: Georgiann Cocker. 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  Normal neuro exam today.  Refills sent  on Meclizine, which has benefited her in past.  Recommend use of compression hose during day at home and off at night.  Ensure increased fluid intake during daytime.  Return to office as scheduled or for worsening/ongoing symptoms.            Follow up plan: Return if symptoms worsen or fail to improve.

## 2019-12-24 NOTE — Patient Instructions (Signed)

## 2019-12-24 NOTE — Telephone Encounter (Signed)
Pt. Reports she woke up feeling "dizzy and lightheaded, especially when I bend over. And I have to be able to take care of my husband." No other symptoms. States she has had this in the past, but her medication is 69 years old. Appointment made for today.  Reason for Disposition . [1] MODERATE dizziness (e.g., interferes with normal activities) AND [2] has NOT been evaluated by physician for this  (Exception: dizziness caused by heat exposure, sudden standing, or poor fluid intake)  Answer Assessment - Initial Assessment Questions 1. DESCRIPTION: "Describe your dizziness."     Dizzy 2. LIGHTHEADED: "Do you feel lightheaded?" (e.g., somewhat faint, woozy, weak upon standing)     Yes 3. VERTIGO: "Do you feel like either you or the room is spinning or tilting?" (i.e. vertigo)     No 4. SEVERITY: "How bad is it?"  "Do you feel like you are going to faint?" "Can you stand and walk?"   - MILD: Feels slightly dizzy, but walking normally.   - MODERATE: Feels very unsteady when walking, but not falling; interferes with normal activities (e.g., school, work) .   - SEVERE: Unable to walk without falling, or requires assistance to walk without falling; feels like passing out now.      Mild 5. ONSET:  "When did the dizziness begin?"     Today 6. AGGRAVATING FACTORS: "Does anything make it worse?" (e.g., standing, change in head position)     Bendinbg 7. HEART RATE: "Can you tell me your heart rate?" "How many beats in 15 seconds?"  (Note: not all patients can do this)       No 8. CAUSE: "What do you think is causing the dizziness?"     No 9. RECURRENT SYMPTOM: "Have you had dizziness before?" If Yes, ask: "When was the last time?" "What happened that time?"     Yes 10. OTHER SYMPTOMS: "Do you have any other symptoms?" (e.g., fever, chest pain, vomiting, diarrhea, bleeding)       No 11. PREGNANCY: "Is there any chance you are pregnant?" "When was your last menstrual period?"       No  Protocols  used: DIZZINESS [facility]

## 2019-12-24 NOTE — Telephone Encounter (Signed)
Noted  

## 2020-01-16 ENCOUNTER — Other Ambulatory Visit: Payer: Self-pay | Admitting: Nurse Practitioner

## 2020-01-16 DIAGNOSIS — Z1231 Encounter for screening mammogram for malignant neoplasm of breast: Secondary | ICD-10-CM

## 2020-01-25 ENCOUNTER — Ambulatory Visit: Payer: Medicare Other | Admitting: Licensed Clinical Social Worker

## 2020-01-25 DIAGNOSIS — F419 Anxiety disorder, unspecified: Secondary | ICD-10-CM

## 2020-01-25 DIAGNOSIS — E782 Mixed hyperlipidemia: Secondary | ICD-10-CM

## 2020-01-25 DIAGNOSIS — R42 Dizziness and giddiness: Secondary | ICD-10-CM

## 2020-01-25 DIAGNOSIS — F439 Reaction to severe stress, unspecified: Secondary | ICD-10-CM

## 2020-01-25 DIAGNOSIS — I10 Essential (primary) hypertension: Secondary | ICD-10-CM

## 2020-01-25 NOTE — Chronic Care Management (AMB) (Signed)
Chronic Care Management    Clinical Social Work Follow Up Note  01/25/2020 Name: Carol [name] MRN: [id number] DOB: April 12, 1950  Carol [name] is a 69 y.o. year old female who is a primary care patient of Cannady, Carol Rank, NP. The CCM team was consulted for assistance with Mental Health Counseling and Resources and Caregiver Stress.   Review of patient status, including review of consultants reports, other relevant assessments, and collaboration with appropriate care team members and the patient's provider was performed as part of comprehensive patient evaluation and provision of chronic care management services.    SDOH (Social Determinants of Health) assessments performed: Yes    Outpatient Encounter Medications as of 01/25/2020  Medication Sig  . aspirin 81 MG tablet Take 81 mg by mouth daily.  [name] atorvastatin (LIPITOR) 10 MG tablet Take one tablet (10 MG) by mouth every other day.  . fluticasone (FLONASE) 50 MCG/ACT nasal spray SPRAY 2 SPRAYS INTO EACH NOSTRIL ONCE A DAY  . loratadine (CLARITIN) 10 MG tablet Take 10 mg by mouth daily.  [name] losartan (COZAAR) 50 MG tablet Take 0.5 tablets (25 mg total) by mouth daily.  . meclizine (ANTIVERT) 25 MG tablet Take 1 tablet (25 mg total) by mouth 3 (three) times daily as needed for dizziness.  . meloxicam (MOBIC) 15 MG tablet Take 15 mg by mouth as needed for pain.   No facility-administered encounter medications on file as of 01/25/2020.     Goals Addressed    . SW-Track and Manage My Symptoms-Stress, Anxiety and Depression       Timeframe:  Long-Range Goal Priority:  Medium Start Date:  01/25/20                           Expected End Date: 04/25/19                    Follow Up Date- 90 days from 01/25/20   - avoid negative self-talk - develop a personal safety plan - develop a plan to deal with triggers like holidays, anniversaries - exercise at least 2 to 3 times per week - have a plan for how to handle bad days - journal  feelings and what helps to feel better or worse - spend time or talk with others at least 2 to 3 times per week - spend time or talk with others every day - watch for early signs of feeling worse - write in journal every day    Why is this important?    Keeping track of your progress will help your treatment team find the right mix of medicine and therapy for you.   Write in your journal every day.   Day-to-day changes in depression symptoms are normal. It may be more helpful to check your progress at the end of each week instead of every day.    Current Barriers:  . Acute Mental Health needs related to Caregiver strain, self -care and stress management . Financial constraints related to managing health care expenses . Limited social support . Limited access to food . Level of care concerns . ADL IADL limitations . Social Isolation . Limited access to caregiver . Suicidal Ideation/Homicidal Ideation: No  Clinical Social Work Goal(s):   [name] Over the next 120 days, patient/caregiver will work with SW to address concerns related to caregiver strain/community resource connection. LCSW will assist patient in gaining additional support/resource connection and community resource education in order  to maintain health and mental health appropriately  . Over the next 120 days, patient will demonstrate improved adherence to self care as evidenced by implementing healthy self-care into her daily routine such as: attending all medical appointments, deep breathing exercises, taking time for self-reflection, taking medications as prescribed, drinking water and daily exercise to improve mobility and mood.  . Over the next 120 days, patient will work with SW by telephone or in person to reduce or manage symptoms related to stress and anxiety . Over the next 120 days, patient will demonstrate improved health management independence as evidenced by implementing healthy self-care skills and positive  support/resources into her daily routine to help cope with stressors and improve overall health and well-being  . Over the next 120 days, patient or caregiver will verbalize basic understanding of depression/stress process and self health management plan as evidenced by her participation in development of long term plan of care and institution of self health management strategies   Interventions: . Patient interviewed and appropriate assessments performed: brief mental health assessment . Patient reports stable transportation to upcoming medical appointments. Patient does not use her blood pressure monitor at home to check her readings as this is very stressful for her and causes her great anxiety after getting the result. Patient physically comes into PCP office every 2-3 weeks to check blood pressure.  . Patient reports having a recent successful cataract surgery.  . Provided counseling with regard to active caregiver strain and stress. Patient reports that [facility]osp[facility] is still involved which has been a great additional support for family. [facility]ospice has offered to give patient a bath but he declines as he only wants spouse to do this. This puts extra work on patient. Patient admits to frequent stress and occasional depressive symptoms but denied the need for mental health support again on 01/25/20. LCSW discussed coping skills for stress management. SW used empathetic and active and reflective listening, validated patient's feelings/concerns, and provided emotional support. LCSW provided self-care education to help manage her multiple health conditions and improve her mood.  . Provided patient with information about available caregiver support groups and programs within [facility].  . Discussed plans with patient for ongoing care management follow up and provided patient with direct contact information for care management team . Past C[facility] referral made for food support. Patient declined needing this  support when C3 Guide contacted her. Patient reports that she received a $50.00 gift card from Hospice to get groceries which helped family.  . Assisted patient/caregiver with obtaining information about health plan benefits . Provided education and assistance to client regarding Advanced Directives. . Provided education to patient/caregiver regarding level of care options. . Provided education to patient/caregiver about Hospice and/or Palliative Care services . Emotional/Supportive Counseling provided throughout entire session.  . Patient reports that Hospice has brought in a pastor for support. She shares that this pastor visited with family yesterday on 01/24/20. [name]. Patient reports that her main support network includes: her sisters, friends and church family.  . Patient denies any recent dizziness (previous concern)  Patient Self Care Activities:  . Calls provider office for new concerns or questions . Ability for insight . Independent living . Motivation for treatment . Strong family or social support  Patient Coping Strengths:  . Supportive Relationships . Spirituality . Hopefulness . Self Advocate . Able to Communicate Effectively  Patient Self Care Deficits:  . Lacks social connections     Patient Care Plan: General Social Work (Adult)  Problem Identified: Caregiver Stress     Goal: Caregiver Coping Optimized   Start Date: 01/25/2020  Priority: Medium  Note:   Evidence-based guidance:   Recognize and validate the complex nature of dementia, as well as the impact on the caregiver and extended family; provide education and support to align with needs and stage of dementia.   Acknowledge feelings of grief associated with dementia such as loss of relationship, recreational activities, future with patient and loss associated with out-of-home placement.   Encourage verbalization of feelings without ruminating.   Discuss services that may help balance the caregiving role  and living the life he/she chooses such as professionally or peer-led support groups, web-based support, counseling and services such as respite care or in-home help.   Refer for or provide psychoeducation activities such as cognitive reframing to improve family/caregiver quality of life, wellbeing, confidence, perception of burden, mental health and self-efficacy.   Encourage use of individualized coping strategies that may include yoga, spiritual support, distraction, exercise, relaxation, music, massage, music therapy and outdoor activities to utilize nature's restorative properties.   Suggest home-monitoring system that may reduce family/caregiver worry and improve sleep.   Provide proactive acknowledgement of potential for depressive symptoms to appear; normalize response to burden of caregiving; refer to primary care provider or for mental health services.   Notes:    Current Barriers:  . Acute Mental Health needs related to Caregiver strain, self -care and stress management . Financial constraints related to managing health care expenses . Limited social support . Limited access to food . Level of care concerns . ADL IADL limitations . Social Isolation . Limited access to caregiver . Suicidal Ideation/Homicidal Ideation: No  Clinical Social Work Goal(s):   [name] Over the next 120 days, patient/caregiver will work with SW to address concerns related to caregiver strain/community resource connection. LCSW will assist patient in gaining additional support/resource connection and community resource education in order to maintain health and mental health appropriately  . Over the next 120 days, patient will demonstrate improved adherence to self care as evidenced by implementing healthy self-care into her daily routine such as: attending all medical appointments, deep breathing exercises, taking time for self-reflection, taking medications as prescribed, drinking water and daily exercise to  improve mobility and mood.  . Over the next 120 days, patient will work with SW by telephone or in person to reduce or manage symptoms related to stress and anxiety . Over the next 120 days, patient will demonstrate improved health management independence as evidenced by implementing healthy self-care skills and positive support/resources into her daily routine to help cope with stressors and improve overall health and well-being  . Over the next 120 days, patient or caregiver will verbalize basic understanding of depression/stress process and self health management plan as evidenced by her participation in development of long term plan of care and institution of self health management strategies   Interventions: . Patient interviewed and appropriate assessments performed: brief mental health assessment . Patient reports stable transportation to upcoming medical appointments. Patient does not use her blood pressure monitor at home to check her readings as this is very stressful for her and causes her great anxiety after getting the result. Patient physically comes into PCP office every 2-3 weeks to check blood pressure.  . Patient reports having a recent successful cataract surgery.  . Provided counseling with regard to active caregiver strain and stress. Patient reports that Hospice is still involved which has been a great additional support  for family. Hospice has offered to give patient a bath but he declines as he only wants spouse to do this. This puts extra work on patient. Patient admits to frequent stress and occasional depressive symptoms but denied the need for mental health support again on 01/25/20. LCSW discussed coping skills for stress management. SW used empathetic and active and reflective listening, validated patient's feelings/concerns, and provided emotional support. LCSW provided self-care education to help manage her multiple health conditions and improve her mood.  . Provided  patient with information about available caregiver support groups and programs within [facility].  . Discussed plans with patient for ongoing care management follow up and provided patient with direct contact information for care management team . Past C3 referral made for food support. Patient declined needing this support when [facility] Guide contacted her. Patient reports that she received a $50.00 gift card from [facility]ospice to get groceries which helped family.  . Assisted patient/caregiver with obtaining information about health plan benefits . Provided education and assistance to client regarding Advanced Directives. . Provided education to patient/caregiver regarding level of care options. . Provided education to patient/caregiver about Hospice and/or Palliative Care services . Emotional/Supportive Counseling provided throughout entire session.  . Patient reports that Hospice has brought in a pastor for support. She shares that this pastor visited with family yesterday on 01/24/20. [name] Patient reports that her main support network includes: her sisters, friends and church family.  . Patient denies any recent dizziness (previous concern)  Patient Self Care Activities:  . Calls provider office for new concerns or questions . Ability for insight . Independent living . Motivation for treatment . Strong family or social support  Patient Coping Strengths:  . Supportive Relationships . Spirituality . Hopefulness . Self Advocate . Able to Communicate Effectively  Patient Self Care Deficits:  . Lacks social connections   Task: Recognize and Manage Caregiver Stress   Note:   Care Management Activities:    - active listening utilized - caregiver stress acknowledged - complementary therapy use encouraged - consideration of in-home help encouraged - decision-making supported - engagement with primary care provider encouraged - healthy lifestyle promoted - mental health treatment  facilitated - participation in support group encouraged - positive reinforcement provided - self-reflection promoted - social and community activities encouraged - spiritual activity use promoted - support group information provided - verbalization of feelings encouraged    Notes:      Follow Up Plan: SW will follow up with patient by phone over the next quarter  Dickie La, BSW, MSW, LCSW [facility] [place]  [facility] [email or web address] Phone: [phone]

## 2020-01-28 ENCOUNTER — Ambulatory Visit (INDEPENDENT_AMBULATORY_CARE_PROVIDER_SITE_OTHER): Payer: Medicare Other | Admitting: Nurse Practitioner

## 2020-01-28 ENCOUNTER — Other Ambulatory Visit: Payer: Self-pay

## 2020-01-28 ENCOUNTER — Encounter: Payer: Self-pay | Admitting: Nurse Practitioner

## 2020-01-28 VITALS — BP 128/62 | HR 75 | Temp 97.5°F | Ht 62.64 in | Wt 156.0 lb

## 2020-01-28 DIAGNOSIS — Z9889 Other specified postprocedural states: Secondary | ICD-10-CM | POA: Insufficient documentation

## 2020-01-28 DIAGNOSIS — R7989 Other specified abnormal findings of blood chemistry: Secondary | ICD-10-CM

## 2020-01-28 DIAGNOSIS — R7309 Other abnormal glucose: Secondary | ICD-10-CM | POA: Diagnosis not present

## 2020-01-28 DIAGNOSIS — E782 Mixed hyperlipidemia: Secondary | ICD-10-CM

## 2020-01-28 DIAGNOSIS — G8929 Other chronic pain: Secondary | ICD-10-CM

## 2020-01-28 DIAGNOSIS — I10 Essential (primary) hypertension: Secondary | ICD-10-CM

## 2020-01-28 DIAGNOSIS — M25561 Pain in right knee: Secondary | ICD-10-CM | POA: Insufficient documentation

## 2020-01-28 NOTE — Assessment & Plan Note (Signed)
Chronic, ongoing.  Continues every other day statin which has decreased side effects.  Lipid panel up to date, recheck next visit. Adjust dose as needed.

## 2020-01-28 NOTE — Assessment & Plan Note (Addendum)
Recheck TSH and Free T4 next visit, no current symptoms -- recent labs stable.  Reviewed biopsy and ENT notes.

## 2020-01-28 NOTE — Assessment & Plan Note (Signed)
Chronic, ongoing. BP in office today below goal.  She does have increased stressors lately and is not checking at home due to this.  Recommend she monitor at least a few times a week at home.  Continue Losartan 25 MG daily and adjust as needed -- she did not tolerate increase in this dose to 50 MG in past and had dizziness.   Focus on DASH diet at home.  Return to office in 3 months for BP check and reassurance.

## 2020-01-28 NOTE — Patient Instructions (Signed)

## 2020-01-28 NOTE — Assessment & Plan Note (Signed)
Recheck A1C next visit, as trending up recent visit, and change plan of care as needed.

## 2020-01-28 NOTE — Assessment & Plan Note (Signed)
Chronic, with current worsening.  Continue at home regimen, APAP and ice + rest.  Referral to Dr. Hooten placed per patient request. 

## 2020-01-28 NOTE — Progress Notes (Signed)
BP 128/62   Pulse 75   Temp (!) 97.5 F (36.4 C) (Oral)   Ht 5' 2.64" (1.591 m)   Wt 156 lb (70.8 kg)   LMP  (LMP Unknown)   SpO2 97%   BMI 27.95 kg/m    Subjective:    Patient ID: Carol [name], female    DOB: 16-Jan-1951, 69 y.o.   MRN: [id number]  HPI: Carol [name] is a 69 y.o. female  Chief Complaint  Patient presents with  . Hyperlipidemia  . Hypertension  . Prediabetes  . Knee Pain    Right knee, would like to have a referral to have it checked   HYPERTENSION / HYPERLIPIDEMIA Continues on Losartan 25 MG daily.  Continues on Lipitor every other day. Has history of elevation in TSH, but recent levels have remained normal -- TSH in October 4.130.  Had thyroid ultrasound on 10/30/19 -- multinodular goiter noted -- ordered by ENT.  History of elevation in A1C, 6.2% in October, no current symptoms and focused on diet.  Wishes to hold off on labs today.  Recent LDL in October was 97. Satisfied with current treatment?yes Duration of hypertension:chronic BP monitoring frequency:not checking BP range:  BP medication side effects:no Duration of hyperlipidemia:chronic Cholesterol medication side effects:no Cholesterol supplements: none Medication compliance:good compliance Aspirin:yes Recent stressors:no Recurrent headaches:no Visual changes:no Palpitations:no Dyspnea:no Chest pain:no Lower extremity edema:no Dizzy/lightheaded:no  KNEE PAIN Would referral back to Dr. Ernest Pine to have right knee looked at, has seen him before -- she reports wishes to return to him after the new year.  He has looked at her knee a couple times and done orthopedic surgeries on her.  She reports her chronic right knee pain is starting to hurt more due to caring for her husband.  Last time imaging done in March 2020 noted moderate and advanced degenerative changes. Duration: chronic Involved knee: right Mechanism of injury: unknown Location:diffuse Onset:  gradual Severity: 5/10  Quality:  dull and aching Frequency: intermittent Radiation: no Aggravating factors: walking and movement  Alleviating factors: APAP  Status: stable Treatments attempted: ice and APAP  Relief with NSAIDs?:  No NSAIDs Taken Weakness with weight bearing or walking: no Sensation of giving way: yes Locking: no Popping: no Bruising: no Swelling: no Redness: no Paresthesias/decreased sensation: no Fevers: no  Relevant past medical, surgical, family and social history reviewed and updated as indicated. Interim medical history since our last visit reviewed. Allergies and medications reviewed and updated.  Review of Systems  Constitutional: Negative for activity change, appetite change, diaphoresis, fatigue and fever.  Respiratory: Negative for cough, chest tightness and shortness of breath.   Cardiovascular: Negative for chest pain, palpitations and leg swelling.  Gastrointestinal: Negative.   Musculoskeletal: Positive for arthralgias.  Neurological: Negative.   Psychiatric/Behavioral: Negative.     Per HPI unless specifically indicated above     Objective:    BP 128/62   Pulse 75   Temp (!) 97.5 F (36.4 C) (Oral)   Ht 5' 2.64" (1.591 m)   Wt 156 lb (70.8 kg)   LMP  (LMP Unknown)   SpO2 97%   BMI 27.95 kg/m   Wt Readings from Last 3 Encounters:  01/28/20 156 lb (70.8 kg)  12/24/19 157 lb 6.4 oz (71.4 kg)  11/27/19 156 lb (70.8 kg)    Physical Exam Vitals and nursing note reviewed.  Constitutional:      General: She is awake. She is not in acute distress.    Appearance:  She is well-developed and overweight. She is not ill-appearing.  HENT:     Head: Normocephalic.     Right Ear: Hearing, tympanic membrane, ear canal and external ear normal.     Left Ear: Hearing, tympanic membrane, ear canal and external ear normal.  Eyes:     General: Lids are normal.        Right eye: No discharge.        Left eye: No discharge.      Conjunctiva/sclera: Conjunctivae normal.     Pupils: Pupils are equal, round, and reactive to light.  Neck:     Vascular: No carotid bruit.  Cardiovascular:     Rate and Rhythm: Normal rate and regular rhythm.     Heart sounds: Normal heart sounds. No murmur heard. No gallop.   Pulmonary:     Effort: Pulmonary effort is normal. No accessory muscle usage or respiratory distress.     Breath sounds: Normal breath sounds.  Abdominal:     General: Bowel sounds are normal.     Palpations: Abdomen is soft.  Musculoskeletal:     Cervical back: Normal range of motion and neck supple.     Right knee: Crepitus present. No swelling or bony tenderness. Normal range of motion. No tenderness. Normal pulse.     Left knee: Normal.     Right lower leg: No edema.     Left lower leg: No edema.  Skin:    General: Skin is warm and dry.  Neurological:     Mental Status: She is alert and oriented to person, place, and time.  Psychiatric:        Attention and Perception: Attention normal.        Mood and Affect: Mood normal.        Speech: Speech normal.        Behavior: Behavior normal. Behavior is cooperative.        Thought Content: Thought content normal.     Results for orders placed or performed during the hospital encounter of 11/30/19  Cytology - Non PAP;  Result Value Ref Range   CYTOLOGY - NON GYN      CYTOLOGY - NON PAP CASE: [id number] PATIENT: Tyrell Kienle Non-Gynecological Cytology Report     Specimen Submitted: A. Right thyroid  Clinical History: None provided      DIAGNOSIS: A. RIGHT THYROID, ULTRASOUND-GUIDED FINE NEEDLE ASPIRATION: - BENIGN (BETHESDA DIAGNOSTIC CATEGORY II).  Comment: Smears and ThinPrep demonstrate abundant polymorphic lymphoid cells with only rare follicular epithelial cells.  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  If clinical concern for a thyroid neoplasm persists, additional sampling  may be necessary.   GROSS DESCRIPTION: A. Site: Right thyroid Procedure: Ultrasound-guided FNA Cytotechnologist(s): Mercy Riding  Specimen material collected and submitted for: 6 diff Quik stained slides 6 Pap stained slides Material for ThyroSeq if needed, labeled with the patients name, date of birth, specimen site, and collection date ThinPrep: Yes, 1  Specimen description:  Fixative: Cytoloyt Volume: 25 mL Color: Red Transparency: Clear Tissue fragments present: Yes   Final Diagnosis performed by Georgeanna Harrison, MD.   Electronically signed 12/04/2019 4:07:46PM The electronic signature indicates that the named Attending Pathologist has evaluated the specimen Technical component performed at [facility], [address] [place], [place] [address] Lab: [phone] Dir: Carlus Stay Schimke, MD, MMM  Professional component performed at [facility], [facility], [address] [place], [place], [place] [address] Lab: [phone] Dir: Georgiann Cocker. Oneita Kras, MD  Assessment & Plan:   Problem List Items Addressed This Visit      Cardiovascular and Mediastinum   Hypertension - Primary    Chronic, ongoing. BP in office today below goal.  She does have increased stressors lately and is not checking at home due to this.  Recommend she monitor at least a few times a week at home.  Continue Losartan 25 MG daily and adjust as needed -- she did not tolerate increase in this dose to 50 MG in past and had dizziness.   Focus on DASH diet at home.  Return to office in 3 months for BP check and reassurance.          Other   Hyperlipidemia    Chronic, ongoing.  Continues every other day statin which has decreased side effects.  Lipid panel up to date, recheck next visit. Adjust dose as needed.      Elevated TSH    Recheck TSH and Free T4 next visit, no current symptoms -- recent labs stable.  Reviewed biopsy and ENT notes.      Elevated hemoglobin A1c    Recheck A1C next visit, as  trending up recent visit, and change plan of care as needed.      Chronic pain of right knee    Chronic, with current worsening.  Continue at home regimen, APAP and ice + rest.  Referral to Dr. Ernest Pine placed per patient request.      Relevant Orders   Ambulatory referral to Orthopedics       Follow up plan: Return in about 3 months (around 04/27/2020) for HTN/HLD, PREDIABETES, TSH CHECK.

## 2020-02-05 ENCOUNTER — Telehealth: Payer: Self-pay

## 2020-02-05 ENCOUNTER — Telehealth: Payer: Self-pay | Admitting: General Practice

## 2020-02-05 NOTE — Telephone Encounter (Signed)
  Chronic Care Management   Outreach Note  02/05/2020 Name: Carol [name] MRN: [id number] DOB: [birth year]  Referred by: Marjie Skiff, NP Reason for referral : Appointment (RNCM Follow up call for Chronic Disease Management and Care Coordination Needs)   [place] is enrolled in a Managed Medicaid Health Plan: No  An unsuccessful telephone outreach was attempted today. The patient was referred to the case management team for assistance with care management and care coordination.   Follow Up Plan: The care management team will reach out to the patient again over the next 30 to 60 days.   Alto Denver RN, MSN, CCM Community Care Coordinator [place]  [facility] Mobile: [phone]

## 2020-02-18 ENCOUNTER — Ambulatory Visit
Admission: RE | Admit: 2020-02-18 | Discharge: 2020-02-18 | Disposition: A | Discharge status: Home / Self Care | Payer: Medicare Other | Source: Ambulatory Visit | Attending: Nurse Practitioner | Admitting: Nurse Practitioner

## 2020-02-18 ENCOUNTER — Other Ambulatory Visit: Payer: Self-pay

## 2020-02-18 DIAGNOSIS — Z1231 Encounter for screening mammogram for malignant neoplasm of breast: Secondary | ICD-10-CM | POA: Insufficient documentation

## 2020-02-21 NOTE — Telephone Encounter (Signed)
Patient has been rescheduled.

## 2020-03-18 ENCOUNTER — Other Ambulatory Visit: Payer: Self-pay

## 2020-03-18 ENCOUNTER — Ambulatory Visit (INDEPENDENT_AMBULATORY_CARE_PROVIDER_SITE_OTHER): Payer: Medicare Other | Admitting: Nurse Practitioner

## 2020-03-18 ENCOUNTER — Encounter: Payer: Self-pay | Admitting: Nurse Practitioner

## 2020-03-18 VITALS — BP 130/68 | HR 87 | Temp 97.7°F | Ht 62.8 in | Wt 152.8 lb

## 2020-03-18 DIAGNOSIS — G8929 Other chronic pain: Secondary | ICD-10-CM

## 2020-03-18 DIAGNOSIS — I10 Essential (primary) hypertension: Secondary | ICD-10-CM | POA: Diagnosis not present

## 2020-03-18 DIAGNOSIS — R7989 Other specified abnormal findings of blood chemistry: Secondary | ICD-10-CM | POA: Diagnosis not present

## 2020-03-18 DIAGNOSIS — R7309 Other abnormal glucose: Secondary | ICD-10-CM | POA: Diagnosis not present

## 2020-03-18 DIAGNOSIS — M25561 Pain in right knee: Secondary | ICD-10-CM | POA: Diagnosis not present

## 2020-03-18 DIAGNOSIS — R809 Proteinuria, unspecified: Secondary | ICD-10-CM

## 2020-03-18 DIAGNOSIS — E782 Mixed hyperlipidemia: Secondary | ICD-10-CM

## 2020-03-18 DIAGNOSIS — F4321 Adjustment disorder with depressed mood: Secondary | ICD-10-CM

## 2020-03-18 LAB — MICROALBUMIN, URINE WAIVED
Creatinine, Urine Waived: 100 mg/dL (ref 10–300)
Microalb, Ur Waived: 80 mg/L — ABNORMAL HIGH (ref 0–19)
Microalb/Creat Ratio: <30 mg/g (ref <30)

## 2020-03-18 LAB — BAYER DCA HB A1C WAIVED: HB A1C (BAYER DCA - WAIVED): 5.4 % (ref <7.0)

## 2020-03-18 NOTE — Assessment & Plan Note (Signed)
Chronic, with current worsening.  Continue at home regimen, APAP and ice + rest.  Referral to Dr. Ernest Pine placed per patient request.

## 2020-03-18 NOTE — Assessment & Plan Note (Signed)
Chronic, ongoing. BP in office today below goal.  She does have increased stressors lately and is grieving loss of husband.  Recommend she monitor at least a few times a week at home.  Continue Losartan 25 MG daily and adjust as needed -- she did not tolerate increase in this dose to 50 MG in past and had dizziness.   Focus on DASH diet at home.  Return to office in 2 months for BP check and reassurance.

## 2020-03-18 NOTE — Assessment & Plan Note (Signed)
Chronic, ongoing.  Continues every other day statin which has decreased side effects.  Lipid panel today. Adjust dose as needed.

## 2020-03-18 NOTE — Progress Notes (Signed)
BP 130/68   Pulse 87   Temp 97.7 F (36.5 C) (Oral)   Ht 5' 2.8" (1.595 m)   Wt 152 lb 12.8 oz (69.3 kg)   LMP  (LMP Unknown)   SpO2 98%   BMI 27.24 kg/m    Subjective:    Patient ID: Carol [name], female    DOB: 24-Oct-1950, 70 y.o.   MRN: [id number]  HPI: Carol [name] is a 70 y.o. female  Chief Complaint  Patient presents with  . Hypertension  . Hyperlipidemia  . Elevated A1C   HYPERTENSION / HYPERLIPIDEMIA Continues on Losartan 25 MG daily.  Continues on Lipitor every other day. Has history of elevation in TSH, but recent levels have remained normal -- TSH in October 4.130.  Had thyroid ultrasound on 10/30/19 -- multinodular goiter noted -- ordered by ENT -- had thyroid biopsy which was normal.  History of elevation in A1C, 6.2% in October, no current symptoms and focused on diet.  Recent LDL in October was 97.  Her husband recently passed away, they were married for 30 years -- is going through grieving process, but reports doing okay. Satisfied with current treatment?yes Duration of hypertension:chronic BP monitoring frequency:not checking BP range:  BP medication side effects:no Duration of hyperlipidemia:chronic Cholesterol medication side effects:no Cholesterol supplements: none Medication compliance:good compliance Aspirin:yes Recent stressors:no Recurrent headaches:no Visual changes:no Palpitations:no Dyspnea:no Chest pain:no Lower extremity edema:no Dizzy/lightheaded:no  She wishes to have a new referral placed to Dr. Ernest Pine for her knee pain, this was placed last visit, but it appears [facility] called patient, she does not want to see them.  Depression screen [facility] 2/9 03/18/2020 08/13/2019 07/09/2019 01/02/2019 12/14/2017  Decreased Interest 0 0 0 0 1  Down, Depressed, Hopeless 3 0 1 0 0  PHQ - 2 Score 3 0 1 0 1  Altered sleeping 3 - - 0 0  Tired, decreased energy 1 - - 0 0  Change in appetite 0 - - 0 0  Feeling bad or failure  about yourself  0 - - 0 0  Trouble concentrating 0 - - 0 0  Moving slowly or fidgety/restless 0 - - 0 0  Suicidal thoughts 0 - - 0 0  PHQ-9 Score 7 - - 0 1    Relevant past medical, surgical, family and social history reviewed and updated as indicated. Interim medical history since our last visit reviewed. Allergies and medications reviewed and updated.  Review of Systems  Constitutional: Negative for activity change, appetite change, diaphoresis, fatigue and fever.  Respiratory: Negative for cough, chest tightness and shortness of breath.   Cardiovascular: Negative for chest pain, palpitations and leg swelling.  Gastrointestinal: Negative.   Musculoskeletal: Positive for arthralgias.  Neurological: Negative.   Psychiatric/Behavioral: Negative.     Per HPI unless specifically indicated above     Objective:    BP 130/68   Pulse 87   Temp 97.7 F (36.5 C) (Oral)   Ht 5' 2.8" (1.595 m)   Wt 152 lb 12.8 oz (69.3 kg)   LMP  (LMP Unknown)   SpO2 98%   BMI 27.24 kg/m   Wt Readings from Last 3 Encounters:  03/18/20 152 lb 12.8 oz (69.3 kg)  01/28/20 156 lb (70.8 kg)  12/24/19 157 lb 6.4 oz (71.4 kg)    Physical Exam Vitals and nursing note reviewed.  Constitutional:      General: She is awake. She is not in acute distress.    Appearance: She is  well-developed and overweight. She is not ill-appearing.  HENT:     Head: Normocephalic.     Right Ear: Hearing, tympanic membrane, ear canal and external ear normal.     Left Ear: Hearing, tympanic membrane, ear canal and external ear normal.  Eyes:     General: Lids are normal.        Right eye: No discharge.        Left eye: No discharge.     Conjunctiva/sclera: Conjunctivae normal.     Pupils: Pupils are equal, round, and reactive to light.  Neck:     Vascular: No carotid bruit.  Cardiovascular:     Rate and Rhythm: Normal rate and regular rhythm.     Heart sounds: Normal heart sounds. No murmur heard. No gallop.    Pulmonary:     Effort: Pulmonary effort is normal. No accessory muscle usage or respiratory distress.     Breath sounds: Normal breath sounds.  Abdominal:     General: Bowel sounds are normal.     Palpations: Abdomen is soft.  Musculoskeletal:     Cervical back: Normal range of motion and neck supple.     Right knee: Crepitus present. No swelling or bony tenderness. Normal range of motion. No tenderness. Normal pulse.     Left knee: Normal.     Right lower leg: No edema.     Left lower leg: No edema.  Skin:    General: Skin is warm and dry.  Neurological:     Mental Status: She is alert and oriented to person, place, and time.  Psychiatric:        Attention and Perception: Attention normal.        Mood and Affect: Mood normal.        Speech: Speech normal.        Behavior: Behavior normal. Behavior is cooperative.        Thought Content: Thought content normal.     Results for orders placed or performed during the hospital encounter of 11/30/19  Cytology - Non PAP;  Result Value Ref Range   CYTOLOGY - NON GYN      CYTOLOGY - NON PAP CASE: [id number] PATIENT: Latisha Tarnowski Non-Gynecological Cytology Report     Specimen Submitted: A. Right thyroid  Clinical History: None provided      DIAGNOSIS: A. RIGHT THYROID, ULTRASOUND-GUIDED FINE NEEDLE ASPIRATION: - BENIGN (BETHESDA DIAGNOSTIC CATEGORY II).  Comment: Smears and ThinPrep demonstrate abundant polymorphic lymphoid cells with only rare follicular epithelial cells.  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  If clinical concern for a thyroid neoplasm persists, additional sampling may be necessary.   GROSS DESCRIPTION: A. Site: Right thyroid Procedure: Ultrasound-guided FNA Cytotechnologist(s): Mercy Riding  Specimen material collected and submitted for: 6 diff Quik stained slides 6 Pap stained slides Material for ThyroSeq if needed, labeled with the  patients name, date of birth, specimen site, and collection date ThinPrep: Yes, 1  Specimen description:  Fixative: Cytoloyt Volume: 25 mL Color: Red Transparency: Clear Tissue fragments present: Yes   Final Diagnosis performed by Georgeanna Harrison, MD.   Electronically signed 12/04/2019 4:07:46PM The electronic signature indicates that the named Attending Pathologist has evaluated the specimen Technical component performed at [facility], [address] [place], [place] [address] Lab: [phone] Dir: Elishua Radford Schimke, MD, MMM  Professional component performed at [facility], [facility], [address] [place], [place], [place] [address] Lab: [phone] Dir: Georgiann Cocker. Oneita Kras, MD  Assessment & Plan:   Problem List Items Addressed This Visit      Cardiovascular and Mediastinum   Hypertension - Primary    Chronic, ongoing. BP in office today below goal.  She does have increased stressors lately and is grieving loss of husband.  Recommend she monitor at least a few times a week at home.  Continue Losartan 25 MG daily and adjust as needed -- she did not tolerate increase in this dose to 50 MG in past and had dizziness.   Focus on DASH diet at home.  Return to office in 2 months for BP check and reassurance.        Relevant Orders   Basic metabolic panel     Other   Hyperlipidemia    Chronic, ongoing.  Continues every other day statin which has decreased side effects.  Lipid panel today. Adjust dose as needed.      Relevant Orders   Lipid Panel w/o Chol/HDL Ratio   Elevated TSH    Recheck TSH and Free T4 today, no current symptoms -- recent labs stable.  Reviewed biopsy and ENT notes.      Relevant Orders   T4, free   TSH   Elevated hemoglobin A1c    Recheck A1C today, as trending up recent visit, and change plan of care as needed.      Relevant Orders   Bayer DCA Hb A1c Waived   Microalbumin, Urine Waived   Proteinuria    Continue Losartan for kidney  protection and recheck urine and BMP today.      Chronic pain of right knee    Chronic, with current worsening.  Continue at home regimen, APAP and ice + rest.  Referral to Dr. Ernest Pine placed per patient request.      Relevant Orders   Ambulatory referral to Orthopedics   Grieving    Currently grieving recent loss of husband, PHQ9 == 7 today.  She declines medication or therapy.  Denies SI/HI.  Has her daughter living at house with her, good support system present.  Recommend she trial Melatonin for sleep.  Consider addition of SSRI or Trazodone if worsening mood.          Follow up plan: Return in about 2 months (around 05/19/2020) for HTN/HLD, Prediabetes.

## 2020-03-18 NOTE — Assessment & Plan Note (Signed)
Currently grieving recent loss of husband, PHQ9 == 7 today.  She declines medication or therapy.  Denies SI/HI.  Has her daughter living at house with her, good support system present.  Recommend she trial Melatonin for sleep.  Consider addition of SSRI or Trazodone if worsening mood.

## 2020-03-18 NOTE — Assessment & Plan Note (Signed)
Recheck A1C today, as trending up recent visit, and change plan of care as needed.

## 2020-03-18 NOTE — Assessment & Plan Note (Signed)
Continue Losartan for kidney protection and recheck urine and BMP today.

## 2020-03-18 NOTE — Assessment & Plan Note (Signed)
Recheck TSH and Free T4 today, no current symptoms -- recent labs stable.  Reviewed biopsy and ENT notes. °

## 2020-03-18 NOTE — Patient Instructions (Signed)
Can take Melatonin up to 10 MG -- it is in the vitamin section of many locations.   Managing Loss, Adult People experience loss in many different ways throughout their lives. Events such as moving, changing jobs, and losing friends can create a sense of loss. The loss may be as serious as a major health change, divorce, death of a pet, or death of a loved one. All of these types of loss are likely to create a physical and emotional reaction known as grief. Grief is the result of a major change or an absence of something or someone that you count on. Grief is a normal reaction to loss. A variety of factors can affect your grieving experience, including:  The nature of your loss.  Your relationship to what or whom you lost.  Your understanding of grief and how to manage it.  Your support system. How to manage lifestyle changes Keep to your normal routine as much as possible.  If you have trouble focusing or doing normal activities, it is acceptable to take some time away from your normal routine.  Spend time with friends and loved ones.  Eat a healthy diet, get plenty of sleep, and rest when you feel tired.   How to recognize changes  The way that you deal with your grief will affect your ability to function as you normally do. When grieving, you may experience these changes:  Numbness, shock, sadness, anxiety, anger, denial, and guilt.  Thoughts about death.  Unexpected crying.  A physical sensation of emptiness in your stomach.  Problems sleeping and eating.  Tiredness (fatigue).  Loss of interest in normal activities.  Dreaming about or imagining seeing the person who died.  A need to remember what or whom you lost.  Difficulty thinking about anything other than your loss for a period of time.  Relief. If you have been expecting the loss for a while, you may feel a sense of relief when it happens. Follow these instructions at home: Activity Express your feelings in  healthy ways, such as:  Talking with others about your loss. It may be helpful to find others who have had a similar loss, such as a support group.  Writing down your feelings in a journal.  Doing physical activities to release stress and emotional energy.  Doing creative activities like painting, sculpting, or playing or listening to music.  Practicing resilience. This is the ability to recover and adjust after facing challenges. Reading some resources that encourage resilience may help you to learn ways to practice those behaviors.   General instructions  Be patient with yourself and others. Allow the grieving process to happen, and remember that grieving takes time. ? It is likely that you may never feel completely done with some grief. You may find a way to move on while still cherishing memories and feelings about your loss. ? Accepting your loss is a process. It can take months or longer to adjust.  Keep all follow-up visits as told by your health care provider. This is important. Where to find support To get support for managing loss:  Ask your health care provider for help and recommendations, such as grief counseling or therapy.  Think about joining a support group for people who are managing a loss. Where to find more information You can find more information about managing loss from:  American Society of Clinical Oncology: www.cancer.net  American Psychological Association: [employer] Contact a health care provider if:  Your grief is  extreme and keeps getting worse.  You have ongoing grief that does not improve.  Your body shows symptoms of grief, such as illness.  You feel depressed, anxious, or lonely. Get help right away if:  You have thoughts about hurting yourself or others. If you ever feel like you may hurt yourself or others, or have thoughts about taking your own life, get help right away. You can go to your nearest emergency department or call:  Your  local emergency services (911 in the [place]  A suicide crisis helpline, such as the National Suicide Prevention Lifeline at (512)289-6506. This is open 24 hours a day. Summary  Grief is the result of a major change or an absence of someone or something that you count on. Grief is a normal reaction to loss.  The depth of grief and the period of recovery depend on the type of loss and your ability to adjust to the change and process your feelings.  Processing grief requires patience and a willingness to accept your feelings and talk about your loss with people who are supportive.  It is important to find resources that work for you and to realize that people experience grief differently. There is not one grieving process that works for everyone in the same way.  Be aware that when grief becomes extreme, it can lead to more severe issues like isolation, depression, anxiety, or suicidal thoughts. Talk with your health care provider if you have any of these issues. This information is not intended to replace advice given to you by your health care provider. Make sure you discuss any questions you have with your health care provider. Document Revised: 07/26/2019 Document Reviewed: 07/26/2019 Elsevier Patient Education  2021 [employer].

## 2020-03-19 LAB — BASIC METABOLIC PANEL
BUN/Creatinine Ratio: 11 — ABNORMAL LOW (ref 12–28)
BUN: 9 mg/dL (ref 8–27)
CO2: 23 mmol/L (ref 20–29)
Calcium: 9.8 mg/dL (ref 8.7–10.3)
Chloride: 100 mmol/L (ref 96–106)
Creatinine, Ser: 0.84 mg/dL (ref 0.57–1.00)
GFR calc Af Amer: 83 mL/min/{1.73_m2} (ref >59)
GFR calc non Af Amer: 72 mL/min/{1.73_m2} (ref >59)
Glucose: 83 mg/dL (ref 65–99)
Potassium: 4.1 mmol/L (ref 3.5–5.2)
Sodium: 137 mmol/L (ref 134–144)

## 2020-03-19 LAB — T4, FREE: Free T4: 1.13 ng/dL (ref 0.82–1.77)

## 2020-03-19 LAB — LIPID PANEL W/O CHOL/HDL RATIO
Cholesterol, Total: 150 mg/dL (ref 100–199)
HDL: 48 mg/dL (ref >39)
LDL Chol Calc (NIH): 85 mg/dL (ref 0–99)
Triglycerides: 91 mg/dL (ref 0–149)
VLDL Cholesterol Cal: 17 mg/dL (ref 5–40)

## 2020-03-19 LAB — TSH: TSH: 3.33 u[IU]/mL (ref 0.450–4.500)

## 2020-03-19 NOTE — Progress Notes (Signed)
Please let Page know her labs have returned and overall look great, no changes needed.  She is doing fantastic and A1c is now below prediabetes level at 5.4% -- bravo!!  Keep up the great work!!  Much love sent your way. Keep being awesome!!  Thank you for allowing me to participate in your care. Kindest regards, Elis Sauber

## 2020-03-25 ENCOUNTER — Telehealth: Payer: Self-pay | Admitting: General Practice

## 2020-03-25 ENCOUNTER — Telehealth: Payer: Medicare Other

## 2020-03-25 LAB — FECAL OCCULT BLOOD, GUAIAC: Fecal Occult Blood: NEGATIVE

## 2020-03-25 NOTE — Telephone Encounter (Signed)
  Chronic Care Management   Outreach Note  03/25/2020 Name: Carol [name] MRN: [id number] DOB: 15-Feb-1951  Referred by: Marjie Skiff, NP Reason for referral : Appointment (RNCM: Follow up for Chronic Disease Management and Care Coordination Needs)   An unsuccessful telephone outreach was attempted today. The patient was referred to the case management team for assistance with care management and care coordination. The patient answered the phone and states that she is taking the grandkids to school due to delay.  She ask for a reschedule appointment .     Follow Up Plan: Telephone follow up appointment with care management team member scheduled for: 05-06-2020 at 0945 am  Alto Denver RN, MSN, CCM Community Care Coordinator [place]  [facility] Mobile: [phone]

## 2020-04-18 ENCOUNTER — Telehealth: Payer: Self-pay

## 2020-04-28 ENCOUNTER — Ambulatory Visit: Payer: Medicare Other | Admitting: Nurse Practitioner

## 2020-05-06 ENCOUNTER — Telehealth: Payer: Medicare Other

## 2020-05-06 DIAGNOSIS — Z96643 Presence of artificial hip joint, bilateral: Secondary | ICD-10-CM | POA: Diagnosis not present

## 2020-05-06 DIAGNOSIS — Z96642 Presence of left artificial hip joint: Secondary | ICD-10-CM | POA: Diagnosis not present

## 2020-05-06 DIAGNOSIS — Z96641 Presence of right artificial hip joint: Secondary | ICD-10-CM | POA: Diagnosis not present

## 2020-05-06 DIAGNOSIS — M1711 Unilateral primary osteoarthritis, right knee: Secondary | ICD-10-CM | POA: Diagnosis not present

## 2020-05-12 ENCOUNTER — Ambulatory Visit (INDEPENDENT_AMBULATORY_CARE_PROVIDER_SITE_OTHER): Payer: Medicare Other | Admitting: Licensed Clinical Social Worker

## 2020-05-12 DIAGNOSIS — F4321 Adjustment disorder with depressed mood: Secondary | ICD-10-CM

## 2020-05-12 DIAGNOSIS — I10 Essential (primary) hypertension: Secondary | ICD-10-CM

## 2020-05-12 DIAGNOSIS — F419 Anxiety disorder, unspecified: Secondary | ICD-10-CM

## 2020-05-12 DIAGNOSIS — E782 Mixed hyperlipidemia: Secondary | ICD-10-CM

## 2020-05-12 DIAGNOSIS — F439 Reaction to severe stress, unspecified: Secondary | ICD-10-CM

## 2020-05-12 NOTE — Patient Instructions (Signed)
Licensed Clinical Social Worker Visit Information  Goals we discussed today:  Goals Addressed            This Visit's Progress   . SW-Track and Manage My Symptoms-Stress, Anxiety and Depression        Timeframe:  Long-Range Goal Priority:  Medium Start Date:   05/12/20                      Expected End Date:  08/12/20                     Follow Up Date- 06/26/20  Current Barriers:  . Acute Mental Health needs related to Caregiver strain, self -care and stress management . Financial constraints related to managing health care expenses . Limited social support . Limited access to food . Level of care concerns . ADL IADL limitations . Social Isolation . Limited access to caregiver . Suicidal Ideation/Homicidal Ideation: No  Clinical Social Work Goal(s):   [name] Over the next 120 days, patient/caregiver will work with SW to address concerns related to caregiver strain/community resource connection. LCSW will assist patient in gaining additional support/resource connection and community resource education in order to maintain health and mental health appropriately  . Over the next 120 days, patient will demonstrate improved adherence to self care as evidenced by implementing healthy self-care into her daily routine such as: attending all medical appointments, deep breathing exercises, taking time for self-reflection, taking medications as prescribed, drinking water and daily exercise to improve mobility and mood.  . Over the next 120 days, patient will work with SW by telephone or in person to reduce or manage symptoms related to stress and anxiety . Over the next 120 days, patient will demonstrate improved health management independence as evidenced by implementing healthy self-care skills and positive support/resources into her daily routine to help cope with stressors and improve overall health and well-being  . Over the next 120 days, patient or caregiver will verbalize basic understanding  of depression/stress process and self health management plan as evidenced by her participation in development of long term plan of care and institution of self health management strategies   Interventions: . Patient interviewed and appropriate assessments performed: brief mental health assessment. Patient's spouse passed away from cancer in 03/08/2020. Patient is agreeable to grief therapy referral at this time through [facility]. CCM LCSW completed call to [facility] but was unable to reach any of the therapist directly. Front desk advised CCM LCSW to leave referral on their voice mail. CCM LCSW successfully left referral on their voice mail box on 05/12/20. CCM LCSW ask that they return call to confirm that they received referral.   . Patient was provided positive reinforcement for decreasing her A1C to 5.4. Patient reports implementing appropriate self-care habits into her daily routine such as: drinking water, staying active around the house, taking her medications as prescribed, combating negative thoughts or emotions and staying connected with her family and friends. Positive reinforcement provided.  . Patient confirms that she will be able to attend PCP appointment on 05/22/20. She confirms stable transportation for this as well.  . Patient reports that her daughters resides with her and is able to assist her with her care giving needs. However, daughter works throughout the day which leaves patient alone in the home from morning to evening.  . Patient reports stable transportation to upcoming medical appointments. Patient does not use her blood pressure monitor at  home to check her readings as this is very stressful for her and causes her great anxiety after getting the result. Patient physically comes into PCP office every 2-3 weeks to check blood pressure.  . Patient reports having a recent successful cataract surgery.  . Provided counseling with regard to grief support. Patient admits to  frequent stress and occasional depressive symptoms but denied the need for mental health support other than grief therapy on 05/12/20. LCSW discussed coping skills for stress management. SW used empathetic and active and reflective listening, validated patient's feelings/concerns, and provided emotional support. LCSW provided self-care education to help manage her multiple health conditions and improve her mood.  . Provided patient with information about available caregiver support groups and programs within [facility].  . Discussed plans with patient for ongoing care management follow up and provided patient with direct contact information for care management team . Past C3 referral made for food support. Patient declined needing this support when C3 Guide contacted her. Patient reports that she received a $50.00 gift card from Hospice to get groceries which helped family.  . Assisted patient/caregiver with obtaining information about health plan benefits . Provided education and assistance to client regarding Advanced Directives. . Provided education to patient/caregiver regarding level of care options. . Patient was informed that current CCM LCSW will be leaving position next month and her next CCM Social Work follow up visit will be with another LCSW. Patient was appreciative of support provided and receptive to news . Provided education to patient/caregiver about Hospice and/or Palliative Care services . Emotional/Supportive Counseling provided throughout entire session.  . Patient reports that her main support network includes: her daughter, sisters, friends and church family.  . Patient denies any recent dizziness (previous concern)  Patient Self Care Activities:  . Calls provider office for new concerns or questions . Ability for insight . Independent living . Motivation for treatment . Strong family or social support  Patient Coping Strengths:  . Supportive  Relationships . Spirituality . Hopefulness . Self Advocate . Able to Communicate Effectively  Patient Self Care Deficits:  . Lacks social connections

## 2020-05-12 NOTE — Chronic Care Management (AMB) (Signed)
Chronic Care Management    Clinical Social Work Note  05/12/2020 Name: GLADIE GRAVETTE MRN: [id number] DOB: Feb 25, 1950  [name] is a 70 y.o. year old female who is a primary care patient of Cannady, Barbaraann Faster, NP. The CCM team was consulted to assist the patient with chronic disease management and/or care coordination needs related to: Mental Health Counseling and Resources and Grief Counseling.   Engaged with patient by telephone for follow up visit in response to provider referral for social work chronic care management and care coordination services.   Consent to Services:  The patient was given the following information about Chronic Care Management services today, agreed to services, and gave verbal consent: 1. CCM service includes personalized support from designated clinical staff supervised by the primary care provider, including individualized plan of care and coordination with other care providers 2. 24/7 contact phone numbers for assistance for urgent and routine care needs. 3. Service will only be billed when office clinical staff spend 20 minutes or more in a month to coordinate care. 4. Only one practitioner may furnish and bill the service in a calendar month. 5.The patient may stop CCM services at any time (effective at the end of the month) by phone call to the office staff. 6. The patient will be responsible for cost sharing (co-pay) of up to 20% of the service fee (after annual deductible is met). Patient agreed to services and consent obtained.  Patient agreed to services and consent obtained.   Assessment: Review of patient past medical history, allergies, medications, and health status, including review of relevant consultants reports was performed today as part of a comprehensive evaluation and provision of chronic care management and care coordination services.     SDOH (Social Determinants of Health) assessments and interventions performed:    Advanced Directives  Status: See Care Plan for related entries.  CCM Care Plan  No Known Allergies  Outpatient Encounter Medications as of 05/12/2020  Medication Sig  . aspirin 81 MG tablet Take 81 mg by mouth daily.  [name] atorvastatin (LIPITOR) 10 MG tablet Take one tablet (10 MG) by mouth every other day.  . fluticasone (FLONASE) 50 MCG/ACT nasal spray SPRAY 2 SPRAYS INTO EACH NOSTRIL ONCE A DAY  . loratadine (CLARITIN) 10 MG tablet Take 10 mg by mouth daily.  [name] losartan (COZAAR) 50 MG tablet Take 0.5 tablets (25 mg total) by mouth daily.  . meclizine (ANTIVERT) 25 MG tablet Take 1 tablet (25 mg total) by mouth 3 (three) times daily as needed for dizziness.  . meloxicam (MOBIC) 15 MG tablet Take 15 mg by mouth as needed for pain.   No facility-administered encounter medications on file as of 05/12/2020.    Patient Active Problem List   Diagnosis Date Noted  . Grieving 03/18/2020  . Chronic pain of right knee 01/28/2020  . Proteinuria 09/26/2018  . Elevated hemoglobin A1c 04/14/2018  . Elevated TSH 12/26/2017  . Ganglion of left wrist 12/14/2017  . Hyperlipidemia 09/13/2017  . Advanced care planning/counseling discussion 02/01/2017  . Hypertension 01/13/2015  . Allergic rhinitis 01/13/2015  . Osteoarthritis 01/13/2015  . Alkaline phosphatase elevation 01/13/2015  . Eczema 01/13/2015  . 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 Acute Mental Health needs related to Caregiver strain, self -care and stress management . Financial constraints related to managing health care expenses . Limited social support . Limited access to food . Level of care concerns . ADL IADL limitations . Social Isolation . Limited access to caregiver . Suicidal Ideation/Homicidal Ideation: No  Clinical Social Work Goal(s):   [name] Over the next 120 days, patient/caregiver will work with SW to address concerns related to caregiver strain/community resource connection. LCSW will assist patient in gaining additional support/resource connection and community resource education in order to maintain health and mental health appropriately  . Over the next 120 days, patient will demonstrate improved adherence to self care as evidenced by implementing healthy self-care into her daily routine such as: attending all medical appointments, deep breathing exercises, taking time for self-reflection, taking medications as prescribed, drinking water and daily exercise to improve mobility and mood.  . Over the next 120 days, patient will work with SW by telephone or in person to reduce or manage symptoms related to stress and anxiety . Over the next 120 days, patient will demonstrate improved health management independence as evidenced by implementing healthy self-care skills and positive support/resources into her daily routine to help cope with stressors and improve overall health and well-being  . Over the next 120 days, patient or caregiver will verbalize basic understanding of depression/stress process and self health management plan as evidenced by her participation in development of long term plan of care and institution of self health management strategies   Interventions: . Patient interviewed and appropriate assessments performed: brief mental health assessment. Patient's spouse passed away from cancer in 02-28-20.  Patient is agreeable to grief therapy referral at this time through [facility]. CCM LCSW completed call to [facility] but was unable to reach any of the therapist directly. Front desk advised CCM LCSW to leave referral on their voice mail. CCM LCSW successfully left referral on their voice mail box on 05/12/20. CCM LCSW ask that they return call to confirm that they received referral.   . Patient was provided positive reinforcement for decreasing her A1C to 5.4. Patient reports implementing appropriate self-care habits into her daily routine such as: drinking water, staying active around the house, taking her medications as prescribed, combating negative thoughts or emotions and staying connected with her family and friends. Positive reinforcement provided.  . Patient confirms that she will be able to attend PCP appointment on 05/22/20. She confirms stable transportation for this as well.  . Patient reports that her daughters resides with her and is able to assist her with her care giving needs. However, daughter works throughout the day which leaves patient alone in the home from morning to evening.  . Patient reports stable transportation to upcoming medical appointments. Patient does not use her blood pressure monitor at home to check her readings as this is very stressful for her and causes her great anxiety after  getting the result. Patient physically comes into PCP office every 2-3 weeks to check blood pressure.  . Patient reports having a recent successful cataract surgery.  . Provided counseling with regard to grief support. Patient admits to frequent stress and occasional depressive symptoms but denied the need for mental health support other than grief therapy on 05/12/20. LCSW discussed coping skills for stress management. SW used empathetic and active and reflective listening, validated patient's feelings/concerns, and provided emotional support. LCSW provided self-care education to help manage her  multiple health conditions and improve her mood.  . Provided patient with information about available caregiver support groups and programs within [facility].  . Discussed plans with patient for ongoing care management follow up and provided patient with direct contact information for care management team . Past C3 referral made for food support. Patient declined needing this support when C3 Guide contacted her. Patient reports that she received a $50.00 gift card from [facility]ospice to get groceries which helped family.  . Assisted patient/caregiver with obtaining information about health plan benefits . Provided education and assistance to client regarding Advanced Directives. . Provided education to patient/caregiver regarding level of care options. . Patient was informed that current CCM LCSW will be leaving position next month and her next CCM Social Work follow up visit will be with another LCSW. Patient was appreciative of support provided and receptive to news . Provided education to patient/caregiver about Hospice and/or Palliative Care services . Emotional/Supportive Counseling provided throughout entire session.  . Patient reports that her main support network includes: her daughter, sisters, friends and church family.  . Patient denies any recent dizziness (previous concern)  Patient Self Care Activities:  . Calls provider office for new concerns or questions . Ability for insight . Independent living . Motivation for treatment . Strong family or social support  Patient Coping Strengths:  . Supportive Relationships . Spirituality . Hopefulness . Self Advocate . Able to Communicate Effectively  Patient Self Care Deficits:  . Lacks social connections   Task: Self care        Follow Up Plan: SW will follow up with patient by phone over the next quarter      Eula Fried, [place], MSW, [email or web address] Phone: [phone]

## 2020-05-16 ENCOUNTER — Ambulatory Visit: Payer: Medicare Other | Admitting: Nurse Practitioner

## 2020-05-22 ENCOUNTER — Encounter: Payer: Self-pay | Admitting: Nurse Practitioner

## 2020-05-22 ENCOUNTER — Ambulatory Visit (INDEPENDENT_AMBULATORY_CARE_PROVIDER_SITE_OTHER): Payer: Medicare Other | Admitting: Nurse Practitioner

## 2020-05-22 ENCOUNTER — Other Ambulatory Visit: Payer: Self-pay

## 2020-05-22 VITALS — BP 128/78 | HR 75 | Temp 97.8°F | Ht 62.8 in | Wt 157.7 lb

## 2020-05-22 DIAGNOSIS — F4321 Adjustment disorder with depressed mood: Secondary | ICD-10-CM

## 2020-05-22 DIAGNOSIS — N898 Other specified noninflammatory disorders of vagina: Secondary | ICD-10-CM | POA: Insufficient documentation

## 2020-05-22 DIAGNOSIS — I10 Essential (primary) hypertension: Secondary | ICD-10-CM

## 2020-05-22 DIAGNOSIS — L2084 Intrinsic (allergic) eczema: Secondary | ICD-10-CM

## 2020-05-22 DIAGNOSIS — E782 Mixed hyperlipidemia: Secondary | ICD-10-CM

## 2020-05-22 LAB — WET PREP FOR TRICH, YEAST, CLUE
Clue Cell Exam: POSITIVE — AB
Trichomonas Exam: NEGATIVE
Yeast Exam: NEGATIVE

## 2020-05-22 MED ORDER — METRONIDAZOLE 0.75 % VA GEL: 1.0000 | Freq: Two times a day (BID) | VAGINAL | 0 refills | Status: AC

## 2020-05-22 MED ORDER — TRIAMCINOLONE ACETONIDE 0.1 % EX CREA: 1.0000 "application " | TOPICAL_CREAM | Freq: Two times a day (BID) | CUTANEOUS | 0 refills | Status: DC

## 2020-05-22 NOTE — Progress Notes (Signed)
BP 128/78 (BP Location: Left Arm)   Pulse 75   Temp 97.8 F (36.6 C) (Oral)   Ht 5' 2.8" (1.595 m)   Wt 157 lb 11.2 oz (71.5 kg)   LMP  (LMP Unknown)   SpO2 96%   BMI 28.12 kg/m    Subjective:    Patient ID: Carol [name], female    DOB: August 12, 1950, 70 y.o.   MRN: [id number]  HPI: Carol [name] is a 70 y.o. female  Chief Complaint  Patient presents with  . Hypertension  . Hyperlipidemia   HYPERTENSION / HYPERLIPIDEMIA Continues on Losartan 25 MG daily.  Continues on Lipitor every other day. Has history of elevation in TSH, but recent levels have remained normal -- TSH in February 3.330.  Had thyroid ultrasound on 10/30/19 -- multinodular goiter noted -- ordered by ENT -- had thyroid biopsy which was normal.  History of elevation in A1C, 6.2% in October 2021, no current symptoms and focused on diet -- recent February 2022 A1c 5.4.  Recent LDL in October was 97.  Her husband recently passed away, they were married for 30 years -- is going through grieving process, but reports doing okay.  Starting counseling on April 19th with hospice. Satisfied with current treatment?yes Duration of hypertension:chronic BP monitoring frequency:not checking BP range:  BP medication side effects:no Duration of hyperlipidemia:chronic Cholesterol medication side effects:no Cholesterol supplements: none Medication compliance:good compliance Aspirin:yes Recent stressors:no Recurrent headaches:no Visual changes:no Palpitations:no Dyspnea:no Chest pain:no Lower extremity edema:no Dizzy/lightheaded:no Depression screen [facility] 2/9 05/22/2020 03/18/2020 08/13/2019 07/09/2019 01/02/2019  Decreased Interest 0 0 0 0 0  Down, Depressed, Hopeless 0 3 0 1 0  PHQ - 2 Score 0 3 0 1 0  Altered sleeping - 3 - - 0  Tired, decreased energy - 1 - - 0  Change in appetite - 0 - - 0  Feeling bad or failure about yourself  - 0 - - 0  Trouble concentrating - 0 - - 0  Moving slowly or  fidgety/restless - 0 - - 0  Suicidal thoughts - 0 - - 0  PHQ-9 Score - 7 - - 0   VAGINAL DISCHARGE Started 2 weeks ago, worse at night while in bed and hot. Duration: weeks Discharge description: clear  Pruritus: yes Dysuria: no Malodorous: no Urinary frequency: no Fevers: no Abdominal pain: no  Sexual activity: not sexually active History of sexually transmitted diseases: no Recent antibiotic use: no Context: stable  Treatments attempted: none   Relevant past medical, surgical, family and social history reviewed and updated as indicated. Interim medical history since our last visit reviewed. Allergies and medications reviewed and updated.  Review of Systems  Constitutional: Negative for activity change, appetite change, diaphoresis, fatigue and fever.  Respiratory: Negative for cough, chest tightness and shortness of breath.   Cardiovascular: Negative for chest pain, palpitations and leg swelling.  Gastrointestinal: Negative.   Musculoskeletal: Positive for arthralgias.  Neurological: Negative.   Psychiatric/Behavioral: Negative.     Per HPI unless specifically indicated above     Objective:    BP 128/78 (BP Location: Left Arm)   Pulse 75   Temp 97.8 F (36.6 C) (Oral)   Ht 5' 2.8" (1.595 m)   Wt 157 lb 11.2 oz (71.5 kg)   LMP  (LMP Unknown)   SpO2 96%   BMI 28.12 kg/m   Wt Readings from Last 3 Encounters:  05/22/20 157 lb 11.2 oz (71.5 kg)  03/18/20 152 lb 12.8 oz (69.3  kg)  01/28/20 156 lb (70.8 kg)    Physical Exam Vitals and nursing note reviewed.  Constitutional:      General: She is awake. She is not in acute distress.    Appearance: She is well-developed and overweight. She is not ill-appearing.  HENT:     Head: Normocephalic.     Right Ear: Hearing and external ear normal. No drainage.     Left Ear: Hearing and external ear normal. No drainage.  Eyes:     General: Lids are normal.        Right eye: No discharge.        Left eye: No discharge.      Conjunctiva/sclera: Conjunctivae normal.     Pupils: Pupils are equal, round, and reactive to light.  Neck:     Vascular: No carotid bruit.  Cardiovascular:     Rate and Rhythm: Normal rate and regular rhythm.     Heart sounds: Normal heart sounds. No murmur heard. No gallop.   Pulmonary:     Effort: Pulmonary effort is normal. No accessory muscle usage or respiratory distress.     Breath sounds: Normal breath sounds.  Abdominal:     General: Bowel sounds are normal.     Palpations: Abdomen is soft.  Musculoskeletal:     Cervical back: Normal range of motion and neck supple.  Skin:    General: Skin is warm and dry.     Findings: Rash present.     Comments: Dry, scaly, flat erythema behind right ear along where mask lies.  Neurological:     Mental Status: She is alert and oriented to person, place, and time.  Psychiatric:        Attention and Perception: Attention normal.        Mood and Affect: Mood normal.        Speech: Speech normal.        Behavior: Behavior normal. Behavior is cooperative.        Thought Content: Thought content normal.     Results for orders placed or performed in visit on 04/18/20  Fecal Occult Blood, Guaiac  Result Value Ref Range   Fecal Occult Blood Negative       Assessment & Plan:   Problem List Items Addressed This Visit      Cardiovascular and Mediastinum   Hypertension - Primary    Chronic, ongoing. BP in office today below goal.  She does have increased stressors lately and is grieving loss of husband.  Recommend she monitor at least a few times a week at home.  Continue Losartan 25 MG daily and adjust as needed -- she did not tolerate increase in this dose to 50 MG in past and had dizziness.   Focus on DASH diet at home.  Return to office in 3 months for BP check and reassurance.          Musculoskeletal and Integument   Eczema    Noted on exam behind right ear, Triamcinolone cream sent.  Has had similar before and treated well  with steroid cream.      Vaginal itching    Acute with wet prep noting negative for yeast and trich, but positive for clue cells.  At this time will treat with Metrogel as does not tolerate pills.  Educated patient on findings. Return to office for worsening or ongoing symptoms.      Relevant Orders   WET PREP FOR TRICH, YEAST, CLUE  Other   Hyperlipidemia    Chronic, ongoing.  Continues every other day statin which has decreased side effects.  Lipid panel next visit.  Adjust dose as needed.      Grieving    Currently grieving recent loss of husband, PHQ9 == 0 today.  She declines medication or therapy.  Denies SI/HI.  Has her daughter living at house with her, good support system present.  Recommend she trial Melatonin for sleep.  Consider addition of SSRI or Trazodone if worsening mood.          Follow up plan: Return in about 3 months (around 09/03/2020) for HTN/HLD, MOOD, TSH, A1c.

## 2020-05-22 NOTE — Assessment & Plan Note (Signed)
Currently grieving recent loss of husband, PHQ9 == 0 today.  She declines medication or therapy.  Denies SI/HI.  Has her daughter living at house with her, good support system present.  Recommend she trial Melatonin for sleep.  Consider addition of SSRI or Trazodone if worsening mood.

## 2020-05-22 NOTE — Assessment & Plan Note (Addendum)
Chronic, ongoing.  Continues every other day statin which has decreased side effects.  Lipid panel next visit.  Adjust dose as needed.

## 2020-05-22 NOTE — Assessment & Plan Note (Signed)
Acute with wet prep noting negative for yeast and trich, but positive for clue cells.  At this time will treat with Metrogel as does not tolerate pills.  Educated patient on findings. Return to office for worsening or ongoing symptoms.

## 2020-05-22 NOTE — Assessment & Plan Note (Signed)
Noted on exam behind right ear, Triamcinolone cream sent.  Has had similar before and treated well with steroid cream.

## 2020-05-22 NOTE — Assessment & Plan Note (Signed)
Chronic, ongoing. BP in office today below goal.  She does have increased stressors lately and is grieving loss of husband.  Recommend she monitor at least a few times a week at home.  Continue Losartan 25 MG daily and adjust as needed -- she did not tolerate increase in this dose to 50 MG in past and had dizziness.   Focus on DASH diet at home.  Return to office in 3 months for BP check and reassurance.

## 2020-05-22 NOTE — Patient Instructions (Addendum)
Vitamin B12 is good for energy, any B12 1000 MCG daily and then a multivitamin daily -- there are gummy and chewable forms of each.   https://www.mata.com/.pdf">  DASH Eating Plan DASH stands for Dietary Approaches to Stop Hypertension. The DASH eating plan is a healthy eating plan that has been shown to:  Reduce high blood pressure (hypertension).  Reduce your risk for type 2 diabetes, heart disease, and stroke.  Help with weight loss. What are tips for following this plan? Reading food labels  Check food labels for the amount of salt (sodium) per serving. Choose foods with less than 5 percent of the Daily Value of sodium. Generally, foods with less than 300 milligrams (mg) of sodium per serving fit into this eating plan.  To find whole grains, look for the word "whole" as the first word in the ingredient list. Shopping  Buy products labeled as "low-sodium" or "no salt added."  Buy fresh foods. Avoid canned foods and pre-made or frozen meals. Cooking  Avoid adding salt when cooking. Use salt-free seasonings or herbs instead of table salt or sea salt. Check with your health care provider or pharmacist before using salt substitutes.  Do not fry foods. Cook foods using healthy methods such as baking, boiling, grilling, roasting, and broiling instead.  Cook with heart-healthy oils, such as olive, canola, avocado, soybean, or sunflower oil. Meal planning  Eat a balanced diet that includes: ? 4 or more servings of fruits and 4 or more servings of vegetables each day. Try to fill one-half of your plate with fruits and vegetables. ? 6-8 servings of whole grains each day. ? Less than 6 oz (170 g) of lean meat, poultry, or fish each day. A 3-oz (85-g) serving of meat is about the same size as a deck of cards. One egg equals 1 oz (28 g). ? 2-3 servings of low-fat dairy each day. One serving is 1 cup (237 mL). ? 1 serving of nuts, seeds, or beans 5  times each week. ? 2-3 servings of heart-healthy fats. Healthy fats called omega-3 fatty acids are found in foods such as walnuts, flaxseeds, fortified milks, and eggs. These fats are also found in cold-water fish, such as sardines, salmon, and mackerel.  Limit how much you eat of: ? Canned or prepackaged foods. ? Food that is high in trans fat, such as some fried foods. ? Food that is high in saturated fat, such as fatty meat. ? Desserts and other sweets, sugary drinks, and other foods with added sugar. ? Full-fat dairy products.  Do not salt foods before eating.  Do not eat more than 4 egg yolks a week.  Try to eat at least 2 vegetarian meals a week.  Eat more home-cooked food and less restaurant, buffet, and fast food.   Lifestyle  When eating at a restaurant, ask that your food be prepared with less salt or no salt, if possible.  If you drink alcohol: ? Limit how much you use to:  0-1 drink a day for women who are not pregnant.  0-2 drinks a day for men. ? Be aware of how much alcohol is in your drink. In the [place] one drink equals one 12 oz bottle of beer (355 mL), one 5 oz glass of wine (148 mL), or one 1 oz glass of hard liquor (44 mL). General information  Avoid eating more than 2,300 mg of salt a day. If you have hypertension, you may need to reduce your sodium intake to 1,500  mg a day.  Work with your health care provider to maintain a healthy body weight or to lose weight. Ask what an ideal weight is for you.  Get at least 30 minutes of exercise that causes your heart to beat faster (aerobic exercise) most days of the week. Activities may include walking, swimming, or biking.  Work with your health care provider or dietitian to adjust your eating plan to your individual calorie needs. What foods should I eat? Fruits All fresh, dried, or frozen fruit. Canned fruit in natural juice (without added sugar). Vegetables Fresh or frozen vegetables (raw, steamed, roasted,  or grilled). Low-sodium or reduced-sodium tomato and vegetable juice. Low-sodium or reduced-sodium tomato sauce and tomato paste. Low-sodium or reduced-sodium canned vegetables. Grains Whole-grain or whole-wheat bread. Whole-grain or whole-wheat pasta. Brown rice. Orpah Cobb. Bulgur. Whole-grain and low-sodium cereals. Pita bread. Low-fat, low-sodium crackers. Whole-wheat flour tortillas. Meats and other proteins Skinless chicken or [place]. Ground chicken or [place]. Pork with fat trimmed off. Fish and seafood. Egg whites. Dried beans, peas, or lentils. Unsalted nuts, nut butters, and seeds. Unsalted canned beans. Lean cuts of beef with fat trimmed off. Low-sodium, lean precooked or cured meat, such as sausages or meat loaves. Dairy Low-fat (1%) or fat-free (skim) milk. Reduced-fat, low-fat, or fat-free cheeses. Nonfat, low-sodium ricotta or cottage cheese. Low-fat or nonfat yogurt. Low-fat, low-sodium cheese. Fats and oils Soft margarine without trans fats. Vegetable oil. Reduced-fat, low-fat, or light mayonnaise and salad dressings (reduced-sodium). Canola, safflower, olive, avocado, soybean, and sunflower oils. Avocado. Seasonings and condiments Herbs. Spices. Seasoning mixes without salt. Other foods Unsalted popcorn and pretzels. Fat-free sweets. The items listed above may not be a complete list of foods and beverages you can eat. Contact a dietitian for more information. What foods should I avoid? Fruits Canned fruit in a light or heavy syrup. Fried fruit. Fruit in cream or butter sauce. Vegetables Creamed or fried vegetables. Vegetables in a cheese sauce. Regular canned vegetables (not low-sodium or reduced-sodium). Regular canned tomato sauce and paste (not low-sodium or reduced-sodium). Regular tomato and vegetable juice (not low-sodium or reduced-sodium). Rosita Fire. Olives. Grains Baked goods made with fat, such as croissants, muffins, or some breads. Dry pasta or rice meal  packs. Meats and other proteins Fatty cuts of meat. Ribs. Fried meat. Tomasa Blase. Bologna, salami, and other precooked or cured meats, such as sausages or meat loaves. Fat from the back of a pig (fatback). Bratwurst. Salted nuts and seeds. Canned beans with added salt. Canned or smoked fish. Whole eggs or egg yolks. Chicken or [place] with skin. Dairy Whole or 2% milk, cream, and half-and-half. Whole or full-fat cream cheese. Whole-fat or sweetened yogurt. Full-fat cheese. Nondairy creamers. Whipped toppings. Processed cheese and cheese spreads. Fats and oils Butter. Stick margarine. Lard. Shortening. Ghee. Bacon fat. Tropical oils, such as coconut, palm kernel, or palm oil. Seasonings and condiments Onion salt, garlic salt, seasoned salt, table salt, and sea salt. Worcestershire sauce. Tartar sauce. Barbecue sauce. Teriyaki sauce. Soy sauce, including reduced-sodium. Steak sauce. Canned and packaged gravies. Fish sauce. Oyster sauce. Cocktail sauce. Store-bought horseradish. Ketchup. Mustard. Meat flavorings and tenderizers. Bouillon cubes. Hot sauces. Pre-made or packaged marinades. Pre-made or packaged taco seasonings. Relishes. Regular salad dressings. Other foods Salted popcorn and pretzels. The items listed above may not be a complete list of foods and beverages you should avoid. Contact a dietitian for more information. Where to find more information  National Heart, Lung, and Blood Institute: PopSteam.is  American Heart Association: www.heart.org  Academy of Nutrition and Dietetics: www.eatright.org  National Kidney Foundation: www.kidney.org Summary  The DASH eating plan is a healthy eating plan that has been shown to reduce high blood pressure (hypertension). It may also reduce your risk for type 2 diabetes, heart disease, and stroke.  When on the DASH eating plan, aim to eat more fresh fruits and vegetables, whole grains, lean proteins, low-fat dairy, and heart-healthy  fats.  With the DASH eating plan, you should limit salt (sodium) intake to 2,300 mg a day. If you have hypertension, you may need to reduce your sodium intake to 1,500 mg a day.  Work with your health care provider or dietitian to adjust your eating plan to your individual calorie needs. This information is not intended to replace advice given to you by your health care provider. Make sure you discuss any questions you have with your health care provider. Document Revised: 01/05/2019 Document Reviewed: 01/05/2019 Elsevier Patient Education  2021 [employer].

## 2020-06-03 ENCOUNTER — Telehealth: Payer: Medicare Other | Admitting: General Practice

## 2020-06-03 ENCOUNTER — Ambulatory Visit (INDEPENDENT_AMBULATORY_CARE_PROVIDER_SITE_OTHER): Payer: Medicare Other | Admitting: General Practice

## 2020-06-03 DIAGNOSIS — F4321 Adjustment disorder with depressed mood: Secondary | ICD-10-CM

## 2020-06-03 DIAGNOSIS — E782 Mixed hyperlipidemia: Secondary | ICD-10-CM

## 2020-06-03 DIAGNOSIS — I10 Essential (primary) hypertension: Secondary | ICD-10-CM | POA: Diagnosis not present

## 2020-06-03 NOTE — Chronic Care Management (AMB) (Signed)
Chronic Care Management   CCM RN Visit Note  06/03/2020 Name: Carol [name] MRN: [id number] DOB: 1950-06-14  Subjective: Carol [name] is a 70 y.o. year old female who is a primary care patient of Cannady, Barbaraann Faster, NP. The care management team was consulted for assistance with disease management and care coordination needs.    Engaged with patient by telephone for follow up visit in response to provider referral for case management and/or care coordination services.   Consent to Services:  The patient was given information about Chronic Care Management services, agreed to services, and gave verbal consent prior to initiation of services.  Please see initial visit note for detailed documentation.   Patient agreed to services and verbal consent obtained.   Assessment: Review of patient past medical history, allergies, medications, health status, including review of consultants reports, laboratory and other test data, was performed as part of comprehensive evaluation and provision of chronic care management services.   SDOH (Social Determinants of Health) assessments and interventions performed:    CCM Care Plan  No Known Allergies  Outpatient Encounter Medications as of 06/03/2020  Medication Sig  . aspirin 81 MG tablet Take 81 mg by mouth daily.  [name] atorvastatin (LIPITOR) 10 MG tablet Take one tablet (10 MG) by mouth every other day.  . fluticasone (FLONASE) 50 MCG/ACT nasal spray SPRAY 2 SPRAYS INTO EACH NOSTRIL ONCE A DAY  . loratadine (CLARITIN) 10 MG tablet Take 10 mg by mouth daily.  [name] losartan (COZAAR) 50 MG tablet Take 0.5 tablets (25 mg total) by mouth daily.  . meclizine (ANTIVERT) 25 MG tablet Take 1 tablet (25 mg total) by mouth 3 (three) times daily as needed for dizziness.  . meloxicam (MOBIC) 15 MG tablet Take 15 mg by mouth as needed for pain.  [name] triamcinolone (KENALOG) 0.1 % Apply 1 application topically 2 (two) times daily.   No facility-administered encounter  medications on file as of 06/03/2020.    Patient Active Problem List   Diagnosis Date Noted  . Vaginal itching 05/22/2020  . Grieving 03/18/2020  . Chronic pain of right knee 01/28/2020  . Proteinuria 09/26/2018  . Elevated hemoglobin A1c 04/14/2018  . Elevated TSH 12/26/2017  . Ganglion of left wrist 12/14/2017  . Hyperlipidemia 09/13/2017  . Advanced care planning/counseling discussion 02/01/2017  . Hypertension 01/13/2015  . Allergic rhinitis 01/13/2015  . Osteoarthritis 01/13/2015  . Alkaline phosphatase elevation 01/13/2015  . Eczema 01/13/2015  . 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 Last practice recorded BP readings:  BP Readings from Last 3 Encounters:  05/22/20 128/78  03/18/20 130/68  01/28/20 128/62 .   [name] Most recent eGFR/CrCl: No results found for: EGFR  No components found for: CRCL Current Barriers:  [name] Knowledge Deficits related to basic understanding of hypertension pathophysiology and self care management . Knowledge Deficits related to understanding of medications prescribed for management of hypertension . Limited Social Support . Lacks social connections . Does not maintain contact with provider office . Does not contact provider office for questions/concerns Case Manager Clinical Goal(s):  . patient will verbalize understanding of plan for hypertension management . patient  will attend all scheduled medical appointments: 09-03-2020 at 10 am . patient will demonstrate improved adherence to prescribed treatment plan for hypertension as evidenced by taking all medications as prescribed, monitoring and recording blood  pressure as directed, adhering to low sodium/DASH diet . patient will demonstrate improved health management independence as evidenced by checking blood pressure as directed and notifying PCP if SBP>160 or DBP > 90, taking all medications as prescribe, and adhering to a low sodium diet as discussed. . patient will verbalize basic understanding of hypertension disease process and self health management plan as evidenced by taking medications as prescribed, adhering to heart healthy diet, and working with CCM team for management of Chronic health conditions Interventions:  . Collaboration with Venita Lick, NP regarding development and update of comprehensive plan of care as evidenced by provider attestation and co-signature . Inter-disciplinary care team collaboration (see longitudinal plan of care) . Evaluation of current treatment plan related to hypertension self management and patient's adherence to plan as established by provider. . Provided education to patient re: stroke prevention, s/s of heart attack and stroke, DASH diet, complications of uncontrolled blood pressure . Reviewed medications with patient and discussed importance of compliance . Discussed plans with patient for ongoing care management follow up and provided patient with direct contact information for care management team . Advised patient, providing education and rationale, to monitor blood pressure daily and record, calling PCP for findings outside established parameters.  . Reviewed scheduled/upcoming provider appointments including: 09-03-2020 at 10 am Self-Care Activities: - Self administers medications as prescribed Attends all scheduled provider appointments Calls provider office for new concerns, questions, or BP outside discussed parameters Checks BP and records as discussed Follows a low sodium diet/DASH diet Patient Goals: - check blood pressure weekly - choose a place to take my blood pressure (home, clinic or  office, retail store) - write blood pressure results in a log or diary - agree on reward when goals are met - agree to work together to make changes - ask questions to understand - have a family meeting to talk about healthy habits - learn about high blood pressure - blood pressure trends reviewed - depression screen reviewed - home or ambulatory blood pressure monitoring encouraged  Follow Up Plan: Telephone follow up appointment with care management team member scheduled for: 08-05-2020 at 345 pm   Task: RNCM: Identify and Monitor Blood Pressure Elevation   Note:   Care Management Activities:    - blood pressure trends reviewed - depression screen reviewed - home or ambulatory blood pressure monitoring encouraged       Care Plan : RNCM: Depression (Adult)  Updates made by Vanita Ingles since 06/03/2020 12:00 AM    Problem: RNCM: Depression Identification (Depression) and Grieving   Priority: Medium    Long-Range Goal: RNCM: Depressive Symptoms Identified   Priority: High  Note:   Current Barriers:  . Chronic Disease Management support and education needs related to depression and grief . Lacks caregiver support.  . Unable to independently manage depression and grief  . Lacks social connections . Does not maintain contact with provider office . Does not contact provider office for questions/concerns  Nurse Case Manager Clinical Goal(s):  . patient will verbalize understanding of plan for effective management of depression and grief  . patient will work with [facility] and pcp to address needs related to effective management of depression and grief  . patient will attend all scheduled medical appointments: 09-03-2020 at 10 am  Interventions:  . 1:1 collaboration with Venita Lick, NP regarding development and update of comprehensive plan of care as evidenced by provider attestation and co-signature . Inter-disciplinary care team collaboration (see longitudinal plan of  care) . Evaluation of current treatment plan related to depression and grief  and patient's adherence to plan as established by provider. . Advised patient to call the office for changes in mood, anxiety, or depression . Provided education to patient re: reaching out to [facility] for support for depression and grief  . Reviewed scheduled/upcoming provider appointments including:  . Discussed plans with patient for ongoing care management follow up and provided patient with direct contact information for care management team  Patient Goals/Self-Care Activities Over the next 120 days, patient will:  - Patient will self administer medications as prescribed Patient will attend all scheduled provider appointments Patient will call pharmacy for medication refills Patient will attend church or other social activities Patient will continue to perform ADL's independently Patient will continue to perform IADL's independently Patient will call provider office for new concerns or questions Patient will work with BSW to address care coordination needs and will continue to work with the clinical team to address health care and disease management related needs.   - anxiety screen reviewed - depression screen reviewed - medication list reviewed  Follow Up Plan: Telephone follow up appointment with care management team member scheduled for: 08-05-2020 at 345 pm       Task: RNCM: Identify Depressive Symptoms and Facilitate Treatment   Note:   Care Management Activities:    - anxiety screen reviewed - depression screen reviewed - medication list reviewed       Care Plan : RNCM: HLD management  Updates made by Vanita Ingles since 06/03/2020 12:00 AM    Problem: RNCM: HLD Management   Priority: Medium    Long-Range Goal: RNCM: HLD Management   Priority: Medium  Note:   Current Barriers:  . Poorly controlled hyperlipidemia, complicated by grief, statin use every other day . Current  antihyperlipidemic regimen: Lipitor 10 mg every other day . Most recent lipid panel:     Component Value Date/Time   CHOL 150 03/18/2020 1352   TRIG 91 03/18/2020 1352   HDL 48 03/18/2020 1352   LDLCALC 85 03/18/2020 1352 .   [name] ASCVD risk enhancing conditions: age >67,  HTN . Lacks social connections . Does not maintain contact with provider office . Does not contact provider office for questions/concerns RN Care Manager Clinical Goal(s):  . patient will work with Consulting civil engineer, providers, and care team towards execution of optimized self-health management plan . patient will verbalize understanding of plan for effective management of HLD . patient will work with [facility] and pcp to address needs related to HLD . patient will attend all scheduled medical appointments: 09-03-2020 at 10 am Interventions: . Collaboration with Venita Lick, NP regarding development and update of comprehensive plan of care as evidenced by provider attestation and co-signature . Inter-disciplinary care team collaboration (see longitudinal plan of care) . Medication review performed; medication list updated in electronic medical record.  Bertram Savin care team collaboration (see longitudinal plan of care) . Referred to pharmacy team for assistance with HLD medication management . Evaluation of current treatment plan related to HLD and patient's adherence to plan as established by provider. . Advised patient to call the office for questions or concerns . Provided education to patient re: heart healthy diet and taking medications as prescribed  .  Reviewed medications with patient and discussed compliance, the patient is takes statins every other day . Provided patient with HLD educational materials related to effective management of HLD . Reviewed scheduled/upcoming provider appointments including: 09-03-2020 at 10 am . Discussed plans with patient for ongoing care management follow up and provided  patient with direct contact information for care management team Patient Goals/Self-Care Activities: - call for medicine refill 2 or 3 days before it runs out - call if I am sick and can't take my medicine - keep a list of all the medicines I take; vitamins and herbals too - learn to read medicine labels - use a pillbox to sort medicine - use an alarm clock or phone to remind me to take my medicine - change to whole grain breads, cereal, pasta - drink 6 to 8 glasses of water each day - eat 3 to 5 servings of fruits and vegetables each day - eat 5 or 6 small meals each day - fill half the plate with nonstarchy vegetables - limit fast food meals to no more than 1 per week - manage portion size - prepare main meal at home 3 to 5 days each week - read food labels for fat, fiber, carbohydrates and portion size - switch to low-fat or skim milk - switch to sugar-free drinks - be open to making changes - I can manage, know and watch for signs of a heart attack - if I have chest pain, call for help - learn about small changes that will make a big difference - learn my personal risk factors - barriers to meeting goals identified - change-talk evoked - choices provided - collaboration with team encouraged - decision-making supported - difficulty of making life-long changes acknowledged - health risks reviewed - problem-solving facilitated - questions answered - readiness for change evaluated - reassurance provided - self-reflection promoted - self-reliance encouraged  Follow Up Plan: Telephone follow up appointment with care management team member scheduled for: 08-05-2020 at 345 pm     Task: RNCM: Mutually Develop and Royce Macadamia Achievement of Patient Goals   Note:   Care Management Activities:    - barriers to meeting goals identified - change-talk evoked - choices provided - collaboration with team encouraged - decision-making supported - difficulty of making life-long changes  acknowledged - health risks reviewed - problem-solving facilitated - questions answered - readiness for change evaluated - reassurance provided - self-reflection promoted - self-reliance encouraged         Plan:Telephone follow up appointment with care management team member scheduled for:  08-05-2020 at 345 pm  [name] RN, MSN, [facility] Mobile: [phone]

## 2020-06-03 NOTE — Patient Instructions (Signed)
Visit Information  PATIENT GOALS: Goals Addressed              This Visit's Progress   .  COMPLETED: RNCM: "Hospice has come in to help [name]" (pt-stated)        CARE PLAN ENTRY (see longitudinal plan of care for additional care plan information)  Current Barriers: Closing this goal and opening in new ELS . Knowledge Deficits related to stress and depression over the patients husbands  recent diagnosis of cancer with recommendations of comfort care  . Care Coordination needs related to nutritional resources and help with utilities  in a patient with HTN/HLD and stress due to husbands terminal illness (disease states) . Lacks caregiver support.  . Film/video editor.   Nurse Case Manager Clinical Goal(s):  [name] Over the next 120 days, patient will work with [facility], pcp and CCM team to address needs related to stress reduction and needs in the home due to the recent terminal diagnosis of her husband  . Over the next 120 days, patient will attend all scheduled medical appointments: next appointment with pcp on 07/13/2019 . Over the next 120 days, patient will work with CM clinical social worker to help with coping and grief of husbands terminal diagnosis and other needs related to financial constraints . Over the next 120 days, patient will work with care guides (community agency) to assist with food resources such as [employer] and resources to help with the high utility bills.   Interventions:  . Inter-disciplinary care team collaboration (see longitudinal plan of care) . Evaluation of current treatment plan related to stress/anxiety/depression and patient's adherence to plan as established by provider.  The patient states that she is maintaining her health and well being.  Education on the importance of self care when taking care of spouse. 12-11-2019: Evaluation of stress and anxiety. The patient states she is doing okay. She says that yesterday was a bad day for [facility] and the  nurse had to come out. She was stressed out about that but he is better today. She goes at 2 pm to the provider today for follow up results for thyroid biopsy she had on 11-30-2019.  She says this is a yearly follow up as they are watching a nodule on her thyroid.  . Advised patient to call the pcp and CCM team for questions or concerns . Provided education to patient re: community resources to help with food and other resources and to expect a call from the care guide team. Education on referral to the care guides for help with food resources. 12-11-2019: the patient declined [employer] at this time. Is getting food from a local church and will let the RNCM know if there are changes.  . Reviewed scheduled/upcoming provider appointments including: upcoming appointment on 01-28-2020 with pcp, just saw the pcp on 11-27-2019.  Knows she can call sooner if needed for any new concerns or needs.  . Social Work referral for coping/grief mechanisms related to new terminal illness diagnosis in the patients husband with recommendations for comfort care. Hospice is coming into the home.   Patient Self Care Activities:  . Patient verbalizes understanding of plan to work with the CCM team to meet her health and wellness needs  . Attends all scheduled provider appointments . Calls provider office for new concerns or questions . Unable to independently manage stress/anxiety/depression  Please see past updates related to this goal by clicking on the "Past Updates" button in the selected  goal      .  COMPLETED: RNCM: Pt-"I am not checking my blood pressure at home it freaks me out" (pt-stated)        CARE PLAN ENTRY (see longtitudinal plan of care for additional care plan information)  Current Barriers: Closing this goal and opening in new ELS . Chronic Disease Management support, education, and care coordination needs related to HTN and HLD  Clinical Goal(s) related to HTN and HLD:  Over the next  120 days, patient will:  . Work with the care management team to address educational, disease management, and care coordination needs  . Begin or continue self health monitoring activities as directed today  adhere to a heart healthy diet and monitor blood pressure if instructed by pcp . Call provider office for new or worsened signs and symptoms New or worsened symptom related to HTN/HLD . Call care management team with questions or concerns . Verbalize basic understanding of patient centered plan of care established today  Interventions related to HTN and HLD:  . Evaluation of current treatment plans and patient's adherence to plan as established by provider.  Has upcoming appointments to see pcp. Next appointment on 01-28-2020.  . Assessed patient understanding of disease states.  The patient verbalized understanding of her conditions. Discussed taking care of self and needs.  The patient has stressors related to being the caregiver of her elderly husband. 12-11-2019: The patient is doing good today. BP was good recently in the office when seeing the pcp. She does not check at home due to the stress of taking care of her "sick" husband. She states she is eating good and resting. Some nights she does not sleep as well as others depends on how things are going with her husband. Review of help in the home. Hospice comes in but she does not have any additional help.  . Assessed patient's education and care coordination needs.  The patient expressed that they did not qualify for food stamps. The patient is interested on [employer] or food resources in the area. Will do a care guide referral for food resources. 12-11-2019: Has adequate food resources at this time.  . Provided disease specific education to patient.  Education on taking blood pressures and recording. The patient states her blood pressure was good during her procedure because if it had been high she would not have been able to have  to procedure. The patient will try to take it at home and record. 12-11-2019: The patient is not taking but has it checked regularly at MD appointments. Sees specialist today for thyroid biopsy and follow up.  Nash Dimmer with appropriate clinical care team members regarding patient needs.  The patient knows about the LCSW and Pharm D on the team. The patient denies any other needs.  . Evaluation of upcoming appointments. Sees pcp on 01-28-2020 and will follow up with the specialist on 12-11-2019 for results of thyroid biopsy taken on 11-30-2019.   Patient Self Care Activities related to HTN and HLD:  . Patient is unable to independently self-manage chronic health conditions  Please see past updates related to this goal by clicking on the "Past Updates" button in the selected goal        Patient Care Plan: General Social Work (Adult)    Problem Identified: Caregiver Stress     Long-Range Goal: Increasing self-care and coping skills   Start Date: 05/12/2020  Priority: Medium  Note:    Timeframe:  Long-Range Goal Priority:  Medium Start Date:   05/12/20                      Expected End Date:  08/12/20                     Follow Up Date- 06/26/20  Current Barriers:  . Acute Mental Health needs related to Caregiver strain, self -care and stress management . Financial constraints related to managing health care expenses . Limited social support . Limited access to food . Level of care concerns . ADL IADL limitations . Social Isolation . Limited access to caregiver . Suicidal Ideation/Homicidal Ideation: No  Clinical Social Work Goal(s):   [name] Over the next 120 days, patient/caregiver will work with SW to address concerns related to caregiver strain/community resource connection. LCSW will assist patient in gaining additional support/resource connection and community resource education in order to maintain health and mental health appropriately  . Over the next 120 days, patient will  demonstrate improved adherence to self care as evidenced by implementing healthy self-care into her daily routine such as: attending all medical appointments, deep breathing exercises, taking time for self-reflection, taking medications as prescribed, drinking water and daily exercise to improve mobility and mood.  . Over the next 120 days, patient will work with SW by telephone or in person to reduce or manage symptoms related to stress and anxiety . Over the next 120 days, patient will demonstrate improved health management independence as evidenced by implementing healthy self-care skills and positive support/resources into her daily routine to help cope with stressors and improve overall health and well-being  . Over the next 120 days, patient or caregiver will verbalize basic understanding of depression/stress process and self health management plan as evidenced by her participation in development of long term plan of care and institution of self health management strategies   Interventions: . Patient interviewed and appropriate assessments performed: brief mental health assessment. Patient's spouse passed away from cancer in 29-Feb-2020. Patient is agreeable to grief therapy referral at this time through [facility]. CCM LCSW completed call to [facility] but was unable to reach any of the therapist directly. Front desk advised CCM LCSW to leave referral on their voice mail. CCM LCSW successfully left referral on their voice mail box on 05/12/20. CCM LCSW ask that they return call to confirm that they received referral.   . Patient was provided positive reinforcement for decreasing her A1C to 5.4. Patient reports implementing appropriate self-care habits into her daily routine such as: drinking water, staying active around the house, taking her medications as prescribed, combating negative thoughts or emotions and staying connected with her family and friends. Positive reinforcement provided.   . Patient confirms that she will be able to attend PCP appointment on 05/22/20. She confirms stable transportation for this as well.  . Patient reports that her daughters resides with her and is able to assist her with her care giving needs. However, daughter works throughout the day which leaves patient alone in the home from morning to evening.  . Patient reports stable transportation to upcoming medical appointments. Patient does not use her blood pressure monitor at home to check her readings as this is very stressful for her and causes her great anxiety after getting the result. Patient physically comes into PCP office every 2-3 weeks to check blood pressure.  . Patient reports having a recent successful cataract surgery.  . Provided counseling with regard to  grief support. Patient admits to frequent stress and occasional depressive symptoms but denied the need for mental health support other than grief therapy on 05/12/20. LCSW discussed coping skills for stress management. SW used empathetic and active and reflective listening, validated patient's feelings/concerns, and provided emotional support. LCSW provided self-care education to help manage her multiple health conditions and improve her mood.  . Provided patient with information about available caregiver support groups and programs within [facility].  . Discussed plans with patient for ongoing care management follow up and provided patient with direct contact information for care management team . Past [facility]3 referral made for food support. Patient declined needing this support when C3 Guide contacted her. Patient reports that she received a $50.00 gift card from Hospice to get groceries which helped family.  . Assisted patient/caregiver with obtaining information about health plan benefits . Provided education and assistance to client regarding Advanced Directives. . Provided education to patient/caregiver regarding level of care  options. . Patient was informed that current CCM LCSW will be leaving position next month and her next CCM Social Work follow up visit will be with another LCSW. Patient was appreciative of support provided and receptive to news . Provided education to patient/caregiver about Hospice and/or Palliative Care services . Emotional/Supportive Counseling provided throughout entire session.  . Patient reports that her main support network includes: her daughter, sisters, friends and church family.  . Patient denies any recent dizziness (previous concern)  Patient Self Care Activities:  . Calls provider office for new concerns or questions . Ability for insight . Independent living . Motivation for treatment . Strong family or social support  Patient Coping Strengths:  . Supportive Relationships . Spirituality . Hopefulness . Self Advocate . Able to Communicate Effectively  Patient Self Care Deficits:  . Lacks social connections   Task: Self care     Patient Care Plan: RNCM: Hypertension (Adult)    Problem Identified: RNCM: Hypertension (Hypertension)   Priority: Medium    Long-Range Goal: RNCM: Hypertension Monitored   Priority: Medium  Note:   Objective:  . Last practice recorded BP readings:  BP Readings from Last 3 Encounters:  05/22/20 128/78  03/18/20 130/68  01/28/20 128/62 .   [name] Most recent eGFR/CrCl: No results found for: EGFR  No components found for: CRCL Current Barriers:  [name] Knowledge Deficits related to basic understanding of hypertension pathophysiology and self care management . Knowledge Deficits related to understanding of medications prescribed for management of hypertension . Limited Social Support . Lacks social connections . Does not maintain contact with provider office . Does not contact provider office for questions/concerns Case Manager Clinical Goal(s):  . patient will verbalize understanding of plan for hypertension management . patient will  attend all scheduled medical appointments: 09-03-2020 at 10 am . patient will demonstrate improved adherence to prescribed treatment plan for hypertension as evidenced by taking all medications as prescribed, monitoring and recording blood pressure as directed, adhering to low sodium/DASH diet . patient will demonstrate improved health management independence as evidenced by checking blood pressure as directed and notifying PCP if SBP>160 or DBP > 90, taking all medications as prescribe, and adhering to a low sodium diet as discussed. . patient will verbalize basic understanding of hypertension disease process and self health management plan as evidenced by taking medications as prescribed, adhering to heart healthy diet, and working with CCM team for management of Chronic health conditions Interventions:  . Collaboration with Venita Lick, NP regarding development and  update of comprehensive plan of care as evidenced by provider attestation and co-signature . Inter-disciplinary care team collaboration (see longitudinal plan of care) . Evaluation of current treatment plan related to hypertension self management and patient's adherence to plan as established by provider. . Provided education to patient re: stroke prevention, s/s of heart attack and stroke, DASH diet, complications of uncontrolled blood pressure . Reviewed medications with patient and discussed importance of compliance . Discussed plans with patient for ongoing care management follow up and provided patient with direct contact information for care management team . Advised patient, providing education and rationale, to monitor blood pressure daily and record, calling PCP for findings outside established parameters.  . Reviewed scheduled/upcoming provider appointments including: 09-03-2020 at 10 am Self-Care Activities: - Self administers medications as prescribed Attends all scheduled provider appointments Calls provider office for  new concerns, questions, or BP outside discussed parameters Checks BP and records as discussed Follows a low sodium diet/DASH diet Patient Goals: - check blood pressure weekly - choose a place to take my blood pressure (home, clinic or office, retail store) - write blood pressure results in a log or diary - agree on reward when goals are met - agree to work together to make changes - ask questions to understand - have a family meeting to talk about healthy habits - learn about high blood pressure - blood pressure trends reviewed - depression screen reviewed - home or ambulatory blood pressure monitoring encouraged  Follow Up Plan: Telephone follow up appointment with care management team member scheduled for: 08-05-2020 at 345 pm   Task: RNCM: Identify and Monitor Blood Pressure Elevation   Note:   Care Management Activities:    - blood pressure trends reviewed - depression screen reviewed - home or ambulatory blood pressure monitoring encouraged       Patient Care Plan: RNCM: Depression (Adult)    Problem Identified: RNCM: Depression Identification (Depression) and Grieving   Priority: Medium    Long-Range Goal: RNCM: Depressive Symptoms Identified   Priority: High  Note:   Current Barriers:  . Chronic Disease Management support and education needs related to depression and grief . Lacks caregiver support.  . Unable to independently manage depression and grief  . Lacks social connections . Does not maintain contact with provider office . Does not contact provider office for questions/concerns  Nurse Case Manager Clinical Goal(s):  . patient will verbalize understanding of plan for effective management of depression and grief  . patient will work with [facility] and pcp to address needs related to effective management of depression and grief  . patient will attend all scheduled medical appointments: 09-03-2020 at 10 am  Interventions:  . 1:1 collaboration with Venita Lick, NP regarding development and update of comprehensive plan of care as evidenced by provider attestation and co-signature . Inter-disciplinary care team collaboration (see longitudinal plan of care) . Evaluation of current treatment plan related to depression and grief  and patient's adherence to plan as established by provider. . Advised patient to call the office for changes in mood, anxiety, or depression . Provided education to patient re: reaching out to [facility] for support for depression and grief  . Reviewed scheduled/upcoming provider appointments including:  . Discussed plans with patient for ongoing care management follow up and provided patient with direct contact information for care management team  Patient Goals/Self-Care Activities Over the next 120 days, patient will:  - Patient will self administer medications as prescribed Patient will attend all  scheduled provider appointments Patient will call pharmacy for medication refills Patient will attend church or other social activities Patient will continue to perform ADL's independently Patient will continue to perform IADL's independently Patient will call provider office for new concerns or questions Patient will work with BSW to address care coordination needs and will continue to work with the clinical team to address health care and disease management related needs.   - anxiety screen reviewed - depression screen reviewed - medication list reviewed  Follow Up Plan: Telephone follow up appointment with care management team member scheduled for: 08-05-2020 at 345 pm       Task: RNCM: Identify Depressive Symptoms and Facilitate Treatment   Note:   Care Management Activities:    - anxiety screen reviewed - depression screen reviewed - medication list reviewed       Patient Care Plan: RNCM: HLD management    Problem Identified: RNCM: HLD Management   Priority: Medium    Long-Range Goal: RNCM: HLD Management    Priority: Medium  Note:   Current Barriers:  . Poorly controlled hyperlipidemia, complicated by grief, statin use every other day . Current antihyperlipidemic regimen: Lipitor 10 mg every other day . Most recent lipid panel:     Component Value Date/Time   CHOL 150 03/18/2020 1352   TRIG 91 03/18/2020 1352   HDL 48 03/18/2020 1352   LDLCALC 85 03/18/2020 1352 .   [name] ASCVD risk enhancing conditions: age >88,  HTN . Lacks social connections . Does not maintain contact with provider office . Does not contact provider office for questions/concerns RN Care Manager Clinical Goal(s):  . patient will work with Consulting civil engineer, providers, and care team towards execution of optimized self-health management plan . patient will verbalize understanding of plan for effective management of HLD . patient will work with [facility] and pcp to address needs related to HLD . patient will attend all scheduled medical appointments: 09-03-2020 at 10 am Interventions: . Collaboration with Venita Lick, NP regarding development and update of comprehensive plan of care as evidenced by provider attestation and co-signature . Inter-disciplinary care team collaboration (see longitudinal plan of care) . Medication review performed; medication list updated in electronic medical record.  Bertram Savin care team collaboration (see longitudinal plan of care) . Referred to pharmacy team for assistance with HLD medication management . Evaluation of current treatment plan related to HLD and patient's adherence to plan as established by provider. . Advised patient to call the office for questions or concerns . Provided education to patient re: heart healthy diet and taking medications as prescribed  . Reviewed medications with patient and discussed compliance, the patient is takes statins every other day . Provided patient with HLD educational materials related to effective management of HLD . Reviewed  scheduled/upcoming provider appointments including: 09-03-2020 at 10 am . Discussed plans with patient for ongoing care management follow up and provided patient with direct contact information for care management team Patient Goals/Self-Care Activities: - call for medicine refill 2 or 3 days before it runs out - call if I am sick and can't take my medicine - keep a list of all the medicines I take; vitamins and herbals too - learn to read medicine labels - use a pillbox to sort medicine - use an alarm clock or phone to remind me to take my medicine - change to whole grain breads, cereal, pasta - drink 6 to 8 glasses of water each day - eat 3 to  5 servings of fruits and vegetables each day - eat 5 or 6 small meals each day - fill half the plate with nonstarchy vegetables - limit fast food meals to no more than 1 per week - manage portion size - prepare main meal at home 3 to 5 days each week - read food labels for fat, fiber, carbohydrates and portion size - switch to low-fat or skim milk - switch to sugar-free drinks - be open to making changes - I can manage, know and watch for signs of a heart attack - if I have chest pain, call for help - learn about small changes that will make a big difference - learn my personal risk factors - barriers to meeting goals identified - change-talk evoked - choices provided - collaboration with team encouraged - decision-making supported - difficulty of making life-long changes acknowledged - health risks reviewed - problem-solving facilitated - questions answered - readiness for change evaluated - reassurance provided - self-reflection promoted - self-reliance encouraged  Follow Up Plan: Telephone follow up appointment with care management team member scheduled for: 08-05-2020 at 345 pm     Task: RNCM: Mutually Develop and Royce Macadamia Achievement of Patient Goals   Note:   Care Management Activities:    - barriers to meeting goals  identified - change-talk evoked - choices provided - collaboration with team encouraged - decision-making supported - difficulty of making life-long changes acknowledged - health risks reviewed - problem-solving facilitated - questions answered - readiness for change evaluated - reassurance provided - self-reflection promoted - self-reliance encouraged         The patient verbalized understanding of instructions, educational materials, and care plan provided today and declined offer to receive copy of patient instructions, educational materials, and care plan.   Telephone follow up appointment with care management team member scheduled for: 08-05-2020 at 345 pm  [name] RN, MSN, [facility] Mobile: [phone]

## 2020-06-12 ENCOUNTER — Ambulatory Visit (INDEPENDENT_AMBULATORY_CARE_PROVIDER_SITE_OTHER): Payer: Medicare Other | Admitting: Nurse Practitioner

## 2020-06-12 ENCOUNTER — Encounter: Payer: Self-pay | Admitting: Nurse Practitioner

## 2020-06-12 DIAGNOSIS — J069 Acute upper respiratory infection, unspecified: Secondary | ICD-10-CM

## 2020-06-12 MED ORDER — GUAIFENESIN-CODEINE 100-10 MG/5ML PO SOLN: 5.0000 mL | Freq: Two times a day (BID) | ORAL | 0 refills | Status: DC | PRN

## 2020-06-12 NOTE — Progress Notes (Signed)
   LMP  (LMP Unknown)    Subjective:    Patient ID: Carol [name], female    DOB: 1950/08/17, 70 y.o.   MRN: [id number]  HPI: Carol [name] is a 70 y.o. female  Chief Complaint  Patient presents with  . Cough    Nasal congestin loosing voice.    UPPER RESPIRATORY TRACT INFECTION Worst symptom: Cough, runny nose and lost voice Fever: no Cough: yes Shortness of breath: no Wheezing: no Chest pain: no Chest tightness: no Chest congestion: no Nasal congestion: yes Runny nose: yes Post nasal drip: no Sneezing: no Sore throat: yes Swollen glands: no Sinus pressure: no Headache: no Face pain: no Toothache: no Ear pain: no bilateral Ear pressure: no bilateral Eyes red/itching:no Eye drainage/crusting: no  Vomiting: no Rash: no Fatigue: no Sick contacts: no Strep contacts: no  Context: stable Recurrent sinusitis: no Relief with OTC cold/cough medications: no  Treatments attempted: cough syrup   Relevant past medical, surgical, family and social history reviewed and updated as indicated. Interim medical history since our last visit reviewed. Allergies and medications reviewed and updated.  Review of Systems  Constitutional: Negative for fatigue and fever.  HENT: Positive for congestion, sneezing and sore throat. Negative for dental problem, ear pain, postnasal drip, rhinorrhea, sinus pressure and sinus pain.   Respiratory: Positive for cough. Negative for shortness of breath and wheezing.   Cardiovascular: Negative for chest pain.  Gastrointestinal: Negative for vomiting.  Skin: Negative for rash.  Neurological: Negative for headaches.    Per HPI unless specifically indicated above     Objective:    LMP  (LMP Unknown)   Wt Readings from Last 3 Encounters:  05/22/20 157 lb 11.2 oz (71.5 kg)  03/18/20 152 lb 12.8 oz (69.3 kg)  01/28/20 156 lb (70.8 kg)    Physical Exam  Results for orders placed or performed in visit on 05/22/20  WET PREP FOR TRICH,  YEAST, CLUE   Specimen: Sterile Swab   Sterile Swab  Result Value Ref Range   Trichomonas Exam Negative Negative   Yeast Exam Negative Negative   Clue Cell Exam Positive (A) Negative      Assessment & Plan:   Problem List Items Addressed This Visit   None   Visit Diagnoses    Viral upper respiratory tract infection    -  Primary   Recommend using OTC cough medicine. Continue with claritin and flonase for symptom relief. Return to clinic if symptoms worsen or fail to improve.       Follow up plan: Return if symptoms worsen or fail to improve.       This visit was completed via MyChart due to the restrictions of the COVID-19 pandemic. All issues as above were discussed and addressed. Physical exam was done as above through visual confirmation on MyChart. If it was felt that the patient should be evaluated in the office, they were directed there. The patient verbally consented to this visit. 1. Location of the patient: Home 2. Location of the provider: Office 3. Those involved with this call:  ? Provider: Larae Grooms, NP ? CMA: Tiffany Reel, CMA ? Front Desk/Registration: Harriet Pho 4. Time spent on call: 12 minutes with patient phone conference. More than 50% of this time was spent in counseling and coordination of care. 20 minutes total spent in review of patient's record and preparation of their chart.

## 2020-06-16 ENCOUNTER — Telehealth: Payer: Self-pay

## 2020-06-16 MED ORDER — AZITHROMYCIN 250 MG PO TABS: ORAL_TABLET | ORAL | 0 refills | Status: AC

## 2020-06-16 MED ORDER — GUAIFENESIN 100 MG/5ML PO SOLN: 5.0000 mL | ORAL | 0 refills | Status: DC | PRN

## 2020-06-16 NOTE — Telephone Encounter (Signed)
Called and spoke with pt. She had an appt 4/28 with Clydie Braun and states that the cough syrup that she was prescribed makes her really drowsy. Pt is wondering if something else like tussin with codeine can be sent in or if she can take regular tussin during the day and the codeine syrup at night. Pt is also wanting to see about getting an antibiotic sent in. Please advise.  Copied from CRM [id number]. Topic: General - Other >> Jun 16, 2020  8:52 AM Jaquita Rector A wrote: Reason for CRM: Patient called in to speak to [facility] states that she still have the cold and cough since last week asking for a call back today please. Say that she only want to speak to [facility] please call Ph# [phone] cell  H# [phone]

## 2020-06-16 NOTE — Telephone Encounter (Signed)
Patient notified

## 2020-06-16 NOTE — Telephone Encounter (Signed)
I sent in regular cough medicine to the pharmacy for patient. Please let her know that she cannot take the two cough medicines at that the same time they need to be taken four hours apart.   I also sent in azithromycin (antibiotic) for her.  If she is having any SOB we should do a chest xray to rule out pneumonia.   Please let me know if she has further questions.

## 2020-06-26 ENCOUNTER — Telehealth: Payer: Self-pay

## 2020-06-30 ENCOUNTER — Other Ambulatory Visit: Payer: Self-pay | Admitting: Nurse Practitioner

## 2020-08-01 ENCOUNTER — Telehealth: Payer: Self-pay

## 2020-08-01 DIAGNOSIS — Z961 Presence of intraocular lens: Secondary | ICD-10-CM | POA: Diagnosis not present

## 2020-08-04 ENCOUNTER — Telehealth: Payer: Self-pay | Admitting: Licensed Clinical Social Worker

## 2020-08-04 ENCOUNTER — Telehealth: Payer: Self-pay

## 2020-08-04 NOTE — Telephone Encounter (Signed)
    Clinical Social Work  Care Management   Phone Outreach    08/04/2020 Name: ALVEDA VANHORNE MRN: [id number] DOB: 11-29-1950  [name] is a 70 y.o. year old female who is a primary care patient of Cannady, Dorie Rank, NP .   CCM LCSW reached out to patient today by phone to introduce self, assess needs and offer Care Management services and interventions.    Telephone outreach was unsuccessful  Plan:CCM LCSW will wait for return call. If no return call is received, Will route chart to Care Guide to see if patient would like to reschedule phone appointment   Review of patient status, including review of consultants reports, relevant laboratory and other test results, and collaboration with appropriate care team members and the patient's provider was performed as part of comprehensive patient evaluation and provision of care management services.    Jenel Lucks, MSW, LCSW [facility] Care Management [place]  [facility] [email or web address] Phone [phone] 4:21 PM

## 2020-08-05 ENCOUNTER — Telehealth: Payer: Self-pay

## 2020-08-05 ENCOUNTER — Telehealth: Payer: Self-pay | Admitting: General Practice

## 2020-08-05 NOTE — Telephone Encounter (Signed)
  Care Management   Follow Up Note   08/05/2020 Name: Carol [name] MRN: [id number] DOB: November 12, 1950   Referred by: Marjie Skiff, NP Reason for referral : Chronic Care Management (RNCM: Follow up for Chronic Disease Management and Care Coordination Needs- Attempt)   An unsuccessful telephone outreach was attempted today. The patient was referred to the case management team for assistance with care management and care coordination.   Follow Up Plan: The care management team will reach out to the patient again over the next 30 to 60 days.   Alto Denver RN, MSN, CCM Community Care Coordinator [place]  [facility] Mobile: [phone]

## 2020-08-20 ENCOUNTER — Ambulatory Visit: Payer: Medicare Other

## 2020-08-25 ENCOUNTER — Ambulatory Visit (INDEPENDENT_AMBULATORY_CARE_PROVIDER_SITE_OTHER): Payer: Medicare Other

## 2020-08-25 VITALS — Ht 63.0 in | Wt 152.0 lb

## 2020-08-25 DIAGNOSIS — Z0000 Encounter for general adult medical examination without abnormal findings: Secondary | ICD-10-CM | POA: Diagnosis not present

## 2020-08-25 NOTE — Patient Instructions (Signed)
Carol [name] , Thank you for taking time to come for your Medicare Wellness Visit. I appreciate your ongoing commitment to your health goals. Please review the following plan we discussed and let me know if I can assist you in the future.   Screening recommendations/referrals: Colonoscopy: FOBT 03/25/2020 Mammogram: completed 02/18/2020 Bone Density: completed 05/02/2018 Recommended yearly ophthalmology/optometry visit for glaucoma screening and checkup Recommended yearly dental visit for hygiene and checkup  Vaccinations: Influenza vaccine: completed 11/27/2019, due 09/15/2020 Pneumococcal vaccine: decline Tdap vaccine: decline Shingles vaccine: discussed   Covid-19: 05/04/2019  Advanced directives: Advance directive discussed with you today.    Conditions/risks identified: none  Next appointment: Follow up in one year for your annual wellness visit    Preventive Care 65 Years and Older, Female Preventive care refers to lifestyle choices and visits with your health care provider that can promote health and wellness. What does preventive care include? A yearly physical exam. This is also called an annual well check. Dental exams once or twice a year. Routine eye exams. Ask your health care provider how often you should have your eyes checked. Personal lifestyle choices, including: Daily care of your teeth and gums. Regular physical activity. Eating a healthy diet. Avoiding tobacco and drug use. Limiting alcohol use. Practicing safe sex. Taking low-dose aspirin every day. Taking vitamin and mineral supplements as recommended by your health care provider. What happens during an annual well check? The services and screenings done by your health care provider during your annual well check will depend on your age, overall health, lifestyle risk factors, and family history of disease. Counseling  Your health care provider may ask you questions about your: Alcohol use. Tobacco use. Drug  use. Emotional well-being. Home and relationship well-being. Sexual activity. Eating habits. History of falls. Memory and ability to understand (cognition). Work and work Astronomer. Reproductive health. Screening  You may have the following tests or measurements: Height, weight, and BMI. Blood pressure. Lipid and cholesterol levels. These may be checked every 5 years, or more frequently if you are over 66 years old. Skin check. Lung cancer screening. You may have this screening every year starting at age 53 if you have a 30-pack-year history of smoking and currently smoke or have quit within the past 15 years. Fecal occult blood test (FOBT) of the stool. You may have this test every year starting at age 42. Flexible sigmoidoscopy or colonoscopy. You may have a sigmoidoscopy every 5 years or a colonoscopy every 10 years starting at age 59. Hepatitis C blood test. Hepatitis B blood test. Sexually transmitted disease (STD) testing. Diabetes screening. This is done by checking your blood sugar (glucose) after you have not eaten for a while (fasting). You may have this done every 1-3 years. Bone density scan. This is done to screen for osteoporosis. You may have this done starting at age 87. Mammogram. This may be done every 1-2 years. Talk to your health care provider about how often you should have regular mammograms. Talk with your health care provider about your test results, treatment options, and if necessary, the need for more tests. Vaccines  Your health care provider may recommend certain vaccines, such as: Influenza vaccine. This is recommended every year. Tetanus, diphtheria, and acellular pertussis (Tdap, Td) vaccine. You may need a Td booster every 10 years. Zoster vaccine. You may need this after age 68. Pneumococcal 13-valent conjugate (PCV13) vaccine. One dose is recommended after age 31. Pneumococcal polysaccharide (PPSV23) vaccine. One dose is recommended after  age  47. Talk to your health care provider about which screenings and vaccines you need and how often you need them. This information is not intended to replace advice given to you by your health care provider. Make sure you discuss any questions you have with your health care provider. Document Released: 02/28/2015 Document Revised: 10/22/2015 Document Reviewed: 12/03/2014 Elsevier Interactive Patient Education  2017 [employer] Prevention in the Home Falls can cause injuries. They can happen to people of all ages. There are many things you can do to make your home safe and to help prevent falls. What can I do on the outside of my home? Regularly fix the edges of walkways and driveways and fix any cracks. Remove anything that might make you trip as you walk through a door, such as a raised step or threshold. Trim any bushes or trees on the path to your home. Use bright outdoor lighting. Clear any walking paths of anything that might make someone trip, such as rocks or tools. Regularly check to see if handrails are loose or broken. Make sure that both sides of any steps have handrails. Any raised decks and porches should have guardrails on the edges. Have any leaves, snow, or ice cleared regularly. Use sand or salt on walking paths during winter. Clean up any spills in your garage right away. This includes oil or grease spills. What can I do in the bathroom? Use night lights. Install grab bars by the toilet and in the tub and shower. Do not use towel bars as grab bars. Use non-skid mats or decals in the tub or shower. If you need to sit down in the shower, use a plastic, non-slip stool. Keep the floor dry. Clean up any water that spills on the floor as soon as it happens. Remove soap buildup in the tub or shower regularly. Attach bath mats securely with double-sided non-slip rug tape. Do not have throw rugs and other things on the floor that can make you trip. What can I do in the  bedroom? Use night lights. Make sure that you have a light by your bed that is easy to reach. Do not use any sheets or blankets that are too big for your bed. They should not hang down onto the floor. Have a firm chair that has side arms. You can use this for support while you get dressed. Do not have throw rugs and other things on the floor that can make you trip. What can I do in the kitchen? Clean up any spills right away. Avoid walking on wet floors. Keep items that you use a lot in easy-to-reach places. If you need to reach something above you, use a strong step stool that has a grab bar. Keep electrical cords out of the way. Do not use floor polish or wax that makes floors slippery. If you must use wax, use non-skid floor wax. Do not have throw rugs and other things on the floor that can make you trip. What can I do with my stairs? Do not leave any items on the stairs. Make sure that there are handrails on both sides of the stairs and use them. Fix handrails that are broken or loose. Make sure that handrails are as long as the stairways. Check any carpeting to make sure that it is firmly attached to the stairs. Fix any carpet that is loose or worn. Avoid having throw rugs at the top or bottom of the stairs. If you do  have throw rugs, attach them to the floor with carpet tape. Make sure that you have a light switch at the top of the stairs and the bottom of the stairs. If you do not have them, ask someone to add them for you. What else can I do to help prevent falls? Wear shoes that: Do not have high heels. Have rubber bottoms. Are comfortable and fit you well. Are closed at the toe. Do not wear sandals. If you use a stepladder: Make sure that it is fully opened. Do not climb a closed stepladder. Make sure that both sides of the stepladder are locked into place. Ask someone to hold it for you, if possible. Clearly mark and make sure that you can see: Any grab bars or  handrails. First and last steps. Where the edge of each step is. Use tools that help you move around (mobility aids) if they are needed. These include: Canes. Walkers. Scooters. Crutches. Turn on the lights when you go into a dark area. Replace any light bulbs as soon as they burn out. Set up your furniture so you have a clear path. Avoid moving your furniture around. If any of your floors are uneven, fix them. If there are any pets around you, be aware of where they are. Review your medicines with your doctor. Some medicines can make you feel dizzy. This can increase your chance of falling. Ask your doctor what other things that you can do to help prevent falls. This information is not intended to replace advice given to you by your health care provider. Make sure you discuss any questions you have with your health care provider. Document Released: 11/28/2008 Document Revised: 07/10/2015 Document Reviewed: 03/08/2014 Elsevier Interactive Patient Education  2017 [employer].

## 2020-08-25 NOTE — Progress Notes (Signed)
I connected with Carol [name] today by telephone and verified that I am speaking with the correct person using two identifiers. Location patient: home Location provider: work Persons participating in the virtual visit: Carol [name], Elisha Ponder LPN.   I discussed the limitations, risks, security and privacy concerns of performing an evaluation and management service by telephone and the availability of in person appointments. I also discussed with the patient that there may be a patient responsible charge related to this service. The patient expressed understanding and verbally consented to this telephonic visit.    Interactive audio and video telecommunications were attempted between this provider and patient, however failed, due to patient having technical difficulties OR patient did not have access to video capability.  We continued and completed visit with audio only.     Vital signs may be patient reported or missing.  Subjective:   Carol [name] is a 70 y.o. female who presents for Medicare Annual (Subsequent) preventive examination.  Review of Systems     Cardiac Risk Factors include: advanced age (>71men, >52 women);hypertension;dyslipidemia;sedentary lifestyle     Objective:    Today's Vitals   08/25/20 1111  Weight: 152 lb (68.9 kg)  Height: 5\' 3"  (1.6 m)   Body mass index is 26.93 kg/m.  Advanced Directives 08/25/2020 08/16/2019 08/13/2019 01/31/2017 01/30/2016 01/27/2015  Does Patient Have a Medical Advance Directive? No No No Yes No No  Does patient want to make changes to medical advance directive? - - - Yes (MAU/Ambulatory/Procedural Areas - Information given) - -  Would patient like information on creating a medical advance directive? - Yes (MAU/Ambulatory/Procedural Areas - Information given) - - - -    Current Medications (verified) Outpatient Encounter Medications as of 08/25/2020  Medication Sig   aspirin 81 MG tablet Take 81 mg by mouth daily.    atorvastatin (LIPITOR) 10 MG tablet TAKE 1 TABLET BY MOUTH EVERY OTHER DAY   fluticasone (FLONASE) 50 MCG/ACT nasal spray SPRAY 2 SPRAYS INTO EACH NOSTRIL ONCE A DAY   guaiFENesin (ROBITUSSIN) 100 MG/5ML SOLN Take 5 mLs (100 mg total) by mouth every 4 (four) hours as needed for cough or to loosen phlegm.   loratadine (CLARITIN) 10 MG tablet Take 10 mg by mouth daily.   losartan (COZAAR) 50 MG tablet Take 0.5 tablets (25 mg total) by mouth daily.   meclizine (ANTIVERT) 25 MG tablet Take 1 tablet (25 mg total) by mouth 3 (three) times daily as needed for dizziness.   meloxicam (MOBIC) 15 MG tablet Take 15 mg by mouth as needed for pain.   triamcinolone (KENALOG) 0.1 % Apply 1 application topically 2 (two) times daily.   guaiFENesin-codeine 100-10 MG/5ML syrup Take 5 mLs by mouth 2 (two) times daily as needed for cough. (Patient not taking: Reported on 08/25/2020)   No facility-administered encounter medications on file as of 08/25/2020.    Allergies (verified) Patient has no known allergies.   History: Past Medical History:  Diagnosis Date   Alkaline phosphatase elevation    Allergy    Eczema    GERD (gastroesophageal reflux disease)    Heart murmur    Hypertension    Muscle cramps    Osteoarthritis    Osteoporosis    Past Surgical History:  Procedure Laterality Date   CATARACT EXTRACTION W/PHACO Right 08/16/2019   Procedure: CATARACT EXTRACTION PHACO AND INTRAOCULAR LENS PLACEMENT (IOC) RIGHT VISION BLUE 5.18  00:36.0;  Surgeon: 10/17/2019, MD;  Location: [facility];  Service: Ophthalmology;  Laterality: Right;   CATARACT EXTRACTION W/PHACO Left 10/04/2019   Procedure: CATARACT EXTRACTION PHACO AND INTRAOCULAR LENS PLACEMENT (IOC) LEFT VISION BLUE;  Surgeon: Elliot Cousin, MD;  Location: [facility];  Service: Ophthalmology;  Laterality: Left;  6.47 0:39.8   JOINT REPLACEMENT Right 2013   hip   Family History  Problem Relation Age of Onset   Cancer Mother     CAD Mother    Hypertension Mother    Cancer Father        throat and lung   Hypertension Brother    Hypertension Daughter    Hypertension Son    Hypertension Brother    Hypertension Brother    Breast cancer Neg Hx    Social History   Socioeconomic History   Marital status: Married    Spouse name: Not on file   Number of children: Not on file   Years of education: 12   Highest education level: 12th grade  Occupational History   Occupation: retired  Tobacco Use   Smoking status: Never   Smokeless tobacco: Never  Building services engineer Use: Never used  Substance and Sexual Activity   Alcohol use: No    Alcohol/week: 0.0 standard drinks   Drug use: No   Sexual activity: Yes  Other Topics Concern   Not on file  Social History Narrative   Not on file   Social Determinants of Health   Financial Resource Strain: Low Risk    Difficulty of Paying Living Expenses: Not hard at all  Food Insecurity: No Food Insecurity   Worried About Programme researcher, broadcasting/film/video in the Last Year: Never true   Ran Out of Food in the Last Year: Never true  Transportation Needs: No Transportation Needs   Lack of Transportation (Medical): No   Lack of Transportation (Non-Medical): No  Physical Activity: Inactive   Days of Exercise per Week: 0 days   Minutes of Exercise per Session: 0 min  Stress: No Stress Concern Present   Feeling of Stress : Only a little  Social Connections: Not on file    Tobacco Counseling Counseling given: Not Answered   Clinical Intake:  Pre-visit preparation completed: Yes  Pain : No/denies pain     Nutritional Status: BMI 25 -29 Overweight Nutritional Risks: None Diabetes: No  How often do you need to have someone help you when you read instructions, pamphlets, or other written materials from your doctor or pharmacy?: 1 - Never What is the last grade level you completed in school?: 12th grade  Diabetic? no  Interpreter Needed?: No  Information entered by ::  NAllen LPN   Activities of Daily Living In your present state of health, do you have any difficulty performing the following activities: 08/25/2020 10/04/2019  Hearing? N N  Vision? N N  Difficulty concentrating or making decisions? N N  Walking or climbing stairs? N N  Dressing or bathing? N N  Doing errands, shopping? N -  Preparing Food and eating ? N -  Using the Toilet? N -  In the past six months, have you accidently leaked urine? Y -  Comment just one time -  Do you have problems with loss of bowel control? N -  Managing your Medications? N -  Managing your Finances? N -  Housekeeping or managing your Housekeeping? N -  Some recent data might be hidden    Patient Care Team: Marjie Skiff, NP as PCP - General (Nurse Practitioner) Francesco Sor  Demetrius Charity, MD (Orthopedic Surgery) Marlowe Sax, RN as Case Manager (General Practice) Gustavus Bryant, LCSW as Social Worker (Licensed Clinical Social Worker)  Indicate any recent [employer] you may have received from other than [facility] providers in the past year (date may be approximate).     Assessment:   This is a routine wellness examination for Carol [name].  Hearing/Vision screen No results found.  Dietary issues and exercise activities discussed: Current Exercise Habits: The patient does not participate in regular exercise at present   Goals Addressed             This Visit's Progress    Patient Stated       08/25/2020, hope to be able to do new job        Depression Screen PHQ 2/9 Scores 08/25/2020 05/22/2020 03/18/2020 08/13/2019 07/09/2019 01/02/2019 12/14/2017  PHQ - 2 Score 0 0 3 0 1 0 1  PHQ- 9 Score - - 7 - - 0 1    Fall Risk Fall Risk  08/25/2020 05/22/2020 03/18/2020 01/28/2020 08/13/2019  Falls in the past year? 0 0 0 0 0  Number falls in past yr: - - - 0 -  Injury with Fall? - - - 0 -  Risk for fall due to : Medication side effect - - - Medication side effect  Follow up Falls evaluation completed;Education  provided;Falls prevention discussed - - - Falls evaluation completed;Education provided;Falls prevention discussed    FALL RISK PREVENTION PERTAINING TO THE HOME:  Any stairs in or around the home? Yes  If so, are there any without handrails? No  Home free of loose throw rugs in walkways, pet beds, electrical cords, etc? Yes  Adequate lighting in your home to reduce risk of falls? Yes   ASSISTIVE DEVICES UTILIZED TO PREVENT FALLS:  Life alert? No  Use of a cane, walker or w/c? No  Grab bars in the bathroom? No  Shower chair or bench in shower? No  Elevated toilet seat or a handicapped toilet? Yes   TIMED UP AND GO:  Was the test performed? No .       Cognitive Function:     6CIT Screen 08/25/2020 08/13/2019 01/31/2017  What Year? 0 points 0 points 0 points  What month? 0 points 0 points 0 points  What time? 0 points 0 points 0 points  Count back from 20 0 points 0 points 0 points  Months in reverse 0 points 4 points 0 points  Repeat phrase 0 points 0 points 0 points  Total Score 0 4 0    Immunizations Immunization History  Administered Date(s) Administered   Fluad Quad(high Dose 65+) 01/02/2019, 11/27/2019   Influenza, High Dose Seasonal PF 12/21/2017   Influenza,inj,Quad PF,6+ Mos 01/27/2015, 02/13/2016   Janssen (J&J) SARS-COV-2 Vaccination 05/04/2019   Td 11/17/2004    TDAP status: Due, Education has been provided regarding the importance of this vaccine. Advised may receive this vaccine at local pharmacy or Health Dept. Aware to provide a copy of the vaccination record if obtained from local pharmacy or Health Dept. Verbalized acceptance and understanding.  Flu Vaccine status: Up to date  Pneumococcal vaccine status: Declined,  Education has been provided regarding the importance of this vaccine but patient still declined. Advised may receive this vaccine at local pharmacy or Health Dept. Aware to provide a copy of the vaccination record if obtained from local  pharmacy or Health Dept. Verbalized acceptance and understanding.   Covid-19 vaccine status: Declined, Education  has been provided regarding the importance of this vaccine but patient still declined. Advised may receive this vaccine at local pharmacy or Health Dept.or vaccine clinic. Aware to provide a copy of the vaccination record if obtained from local pharmacy or Health Dept. Verbalized acceptance and understanding.  Qualifies for Shingles Vaccine? Yes   Zostavax completed No   Shingrix Completed?: No.    Education has been provided regarding the importance of this vaccine. Patient has been advised to call insurance company to determine out of pocket expense if they have not yet received this vaccine. Advised may also receive vaccine at local pharmacy or Health Dept. Verbalized acceptance and understanding.  Screening Tests Health Maintenance  Topic Date Due   Zoster Vaccines- Shingrix (1 of 2) Never done   COVID-19 Vaccine (2 - Booster for [employer] series) 06/29/2019   PNA vac Low Risk Adult (1 of 2 - PCV13) 10/11/2020 (Originally 05/24/2016)   COLONOSCOPY (Pts [phone] Insurance coverage will need to be confirmed)  03/18/2021 (Originally 05/24/1996)   TETANUS/TDAP  03/18/2021 (Originally 11/18/2014)   INFLUENZA VACCINE  09/15/2020   MAMMOGRAM  02/17/2022   DEXA SCAN  Completed   Hepatitis C Screening  Completed   HPV VACCINES  Aged Out    Health Maintenance  Health Maintenance Due  Topic Date Due   Zoster Vaccines- Shingrix (1 of 2) Never done   COVID-19 Vaccine (2 - Booster for Janssen series) 06/29/2019    Colorectal cancer screening: Type of screening: FOBT/FIT. Completed 03/25/2020. Repeat every 1 years  Mammogram status: Completed 02/18/2020. Repeat every year  Bone Density status: Completed 05/02/2018.   Lung Cancer Screening: (Low Dose CT Chest recommended if Age 45-80 years, 30 pack-year currently smoking OR have quit w/in 15years.) does not qualify.   Lung Cancer Screening  Referral: no  Additional Screening:  Hepatitis C Screening: does qualify; Completed 01/27/2015  Vision Screening: Recommended annual ophthalmology exams for early detection of glaucoma and other disorders of the eye. Is the patient up to date with their annual eye exam?  Yes  Who is the provider or what is the name of the office in which the patient attends annual eye exams? [facility] If pt is not established with a provider, would they like to be referred to a provider to establish care? No .   Dental Screening: Recommended annual dental exams for proper oral hygiene  Community Resource Referral / Chronic Care Management: CRR required this visit?  No   CCM required this visit?  No      Plan:     I have personally reviewed and noted the following in the patient's chart:   Medical and social history Use of alcohol, tobacco or illicit drugs  Current medications and supplements including opioid prescriptions.  Functional ability and status Nutritional status Physical activity Advanced directives List of other physicians Hospitalizations, surgeries, and ER visits in previous 12 months Vitals Screenings to include cognitive, depression, and falls Referrals and appointments  In addition, I have reviewed and discussed with patient certain preventive protocols, quality metrics, and best practice recommendations. A written personalized care plan for preventive services as well as general preventive health recommendations were provided to patient.     Barb Merinoickeah E Serafin [place], LPN   1/61/09607/12/2020   Nurse Notes:

## 2020-09-03 ENCOUNTER — Other Ambulatory Visit: Payer: Self-pay

## 2020-09-03 ENCOUNTER — Ambulatory Visit (INDEPENDENT_AMBULATORY_CARE_PROVIDER_SITE_OTHER): Payer: Medicare Other | Admitting: Nurse Practitioner

## 2020-09-03 ENCOUNTER — Encounter: Payer: Self-pay | Admitting: Nurse Practitioner

## 2020-09-03 VITALS — BP 122/72 | HR 73 | Temp 97.7°F | Ht 62.6 in | Wt 160.4 lb

## 2020-09-03 DIAGNOSIS — I10 Essential (primary) hypertension: Secondary | ICD-10-CM

## 2020-09-03 DIAGNOSIS — R7989 Other specified abnormal findings of blood chemistry: Secondary | ICD-10-CM

## 2020-09-03 DIAGNOSIS — B351 Tinea unguium: Secondary | ICD-10-CM

## 2020-09-03 DIAGNOSIS — R7309 Other abnormal glucose: Secondary | ICD-10-CM | POA: Diagnosis not present

## 2020-09-03 DIAGNOSIS — R809 Proteinuria, unspecified: Secondary | ICD-10-CM

## 2020-09-03 DIAGNOSIS — E782 Mixed hyperlipidemia: Secondary | ICD-10-CM

## 2020-09-03 MED ORDER — CICLOPIROX 8 % EX SOLN: Freq: Every day | CUTANEOUS | 0 refills | Status: DC

## 2020-09-03 NOTE — Progress Notes (Signed)
BP 122/72 (BP Location: Left Arm, Patient Position: Sitting, Cuff Size: Normal)   Pulse 73   Temp 97.7 F (36.5 C) (Oral)   Ht 5' 2.6" (1.59 m)   Wt 160 lb 6.4 oz (72.8 kg)   LMP  (LMP Unknown)   SpO2 97%   BMI 28.78 kg/m    Subjective:    Patient ID: Carol [name], female    DOB: [birth year], 70 y.o.   MRN: [id number]  HPI: Carol [name] is a 70 y.o. female  Chief Complaint  Patient presents with   Hypertension    3 month f/up   HYPERTENSION / HYPERLIPIDEMIA Continues on Losartan 25 MG daily for HTN and proteinuria and Lipitor every other day.   Has history of elevation in TSH, but recent levels have remained normal -- TSH in February 3.330.  Thyroid ultrasound on 10/30/19 -- multinodular goiter noted -- ordered by ENT -- had thyroid biopsy which was normal.    History of elevation in A1C, no current symptoms and focused on diet -- recent February 2022 A1c 5.4.  Satisfied with current treatment? yes Duration of hypertension: chronic BP monitoring frequency: not checking BP range: BP medication side effects: no Duration of hyperlipidemia: chronic Cholesterol medication side effects: no Cholesterol supplements: none Medication compliance: good compliance Aspirin: yes Recent stressors: no Recurrent headaches: no Visual changes: no Palpitations: no Dyspnea: no Chest pain: no Lower extremity edema: no Dizzy/lightheaded: no  Relevant past medical, surgical, family and social history reviewed and updated as indicated. Interim medical history since our last visit reviewed. Allergies and medications reviewed and updated.  Review of Systems  Constitutional:  Negative for activity change, appetite change, diaphoresis, fatigue and fever.  Respiratory:  Negative for cough, chest tightness and shortness of breath.   Cardiovascular:  Negative for chest pain, palpitations and leg swelling.  Gastrointestinal: Negative.   Neurological: Negative.   Psychiatric/Behavioral:  Negative.     Per HPI unless specifically indicated above     Objective:    BP 122/72 (BP Location: Left Arm, Patient Position: Sitting, Cuff Size: Normal)   Pulse 73   Temp 97.7 F (36.5 C) (Oral)   Ht 5' 2.6" (1.59 m)   Wt 160 lb 6.4 oz (72.8 kg)   LMP  (LMP Unknown)   SpO2 97%   BMI 28.78 kg/m   Wt Readings from Last 3 Encounters:  09/03/20 160 lb 6.4 oz (72.8 kg)  08/25/20 152 lb (68.9 kg)  05/22/20 157 lb 11.2 oz (71.5 kg)    Physical Exam Vitals and nursing note reviewed.  Constitutional:      General: She is awake. She is not in acute distress.    Appearance: She is well-developed and overweight. She is not ill-appearing.  HENT:     Head: Normocephalic.     Right Ear: Hearing, tympanic membrane, ear canal and external ear normal.     Left Ear: Hearing, tympanic membrane, ear canal and external ear normal.  Eyes:     General: Lids are normal.        Right eye: No discharge.        Left eye: No discharge.     Conjunctiva/sclera: Conjunctivae normal.     Pupils: Pupils are equal, round, and reactive to light.  Neck:     Vascular: No carotid bruit.  Cardiovascular:     Rate and Rhythm: Normal rate and regular rhythm.     Pulses:  Dorsalis pedis pulses are 2+ on the right side and 2+ on the left side.       Posterior tibial pulses are 2+ on the right side.     Heart sounds: Normal heart sounds. No murmur heard.   No gallop.  Pulmonary:     Effort: Pulmonary effort is normal. No accessory muscle usage or respiratory distress.     Breath sounds: Normal breath sounds.  Abdominal:     General: Bowel sounds are normal.     Palpations: Abdomen is soft.  Musculoskeletal:     Cervical back: Normal range of motion and neck supple.     Right lower leg: No edema.     Left lower leg: No edema.     Right foot: Normal range of motion.     Left foot: Normal range of motion.  Feet:     Right foot:     Protective Sensation: 10 sites tested.  10 sites sensed.      Skin integrity: Skin integrity normal.     Toenail Condition: Right toenails are normal.     Left foot:     Protective Sensation: 10 sites tested.  10 sites sensed.     Skin integrity: Skin integrity normal.     Toenail Condition: Fungal disease present.    Comments: Fungal infection left great toe. Skin:    General: Skin is warm and dry.  Neurological:     Mental Status: She is alert and oriented to person, place, and time.  Psychiatric:        Attention and Perception: Attention normal.        Mood and Affect: Mood normal.        Speech: Speech normal.        Behavior: Behavior normal. Behavior is cooperative.        Thought Content: Thought content normal.    Results for orders placed or performed in visit on 05/22/20  WET PREP FOR TRICH, YEAST, CLUE   Specimen: Sterile Swab   Sterile Swab  Result Value Ref Range   Trichomonas Exam Negative Negative   Yeast Exam Negative Negative   Clue Cell Exam Positive (A) Negative      Assessment & Plan:   Problem List Items Addressed This Visit       Cardiovascular and Mediastinum   Hypertension - Primary    Chronic, ongoing. BP in office today below goal.  Recommend she monitor at least a few times a week at home.  Continue Losartan 25 MG daily and adjust as needed -- she did not tolerate increase in this dose to 50 MG in past and had dizziness.   Focus on DASH diet at home.  Return to office in 3 months for BP check and reassurance + labs.  Unable to obtain labs today.         Other   Hyperlipidemia    Chronic, ongoing.  Continues every other day statin which has decreased side effects.  Lipid panel next visit.  Adjust dose as needed.       Elevated TSH    Recheck TSH and Free T4 today, no current symptoms -- recent labs stable.  Reviewed biopsy and ENT notes.       Elevated hemoglobin A1c    Recheck A1C next, as trending up recent visit, and change plan of care as needed.       Proteinuria    Continue Losartan for  kidney protection and recheck urine and CMP  next visit.       Other Visit Diagnoses     Onychomycosis of great toe       Left side, Penlac sent in.   Relevant Medications   ciclopirox (PENLAC) 8 % solution        Follow up plan: Return in about 5 months (around 01/27/2021) for HTN/HLD, THYROID, A1c check.

## 2020-09-03 NOTE — Assessment & Plan Note (Signed)
Recheck A1C next, as trending up recent visit, and change plan of care as needed.

## 2020-09-03 NOTE — Assessment & Plan Note (Signed)
Chronic, ongoing.  Continues every other day statin which has decreased side effects.  Lipid panel next visit.  Adjust dose as needed.

## 2020-09-03 NOTE — Assessment & Plan Note (Signed)
Recheck TSH and Free T4 today, no current symptoms -- recent labs stable.  Reviewed biopsy and ENT notes. °

## 2020-09-03 NOTE — Patient Instructions (Signed)
https://www.nhlbi.nih.gov/files/docs/public/heart/dash_brief.pdf">  DASH Eating Plan DASH stands for Dietary Approaches to Stop Hypertension. The DASH eating plan is a healthy eating plan that has been shown to: Reduce high blood pressure (hypertension). Reduce your risk for type 2 diabetes, heart disease, and stroke. Help with weight loss. What are tips for following this plan? Reading food labels Check food labels for the amount of salt (sodium) per serving. Choose foods with less than 5 percent of the Daily Value of sodium. Generally, foods with less than 300 milligrams (mg) of sodium per serving fit into this eating plan. To find whole grains, look for the word "whole" as the first word in the ingredient list. Shopping Buy products labeled as "low-sodium" or "no salt added." Buy fresh foods. Avoid canned foods and pre-made or frozen meals. Cooking Avoid adding salt when cooking. Use salt-free seasonings or herbs instead of table salt or sea salt. Check with your health care provider or pharmacist before using salt substitutes. Do not fry foods. Cook foods using healthy methods such as baking, boiling, grilling, roasting, and broiling instead. Cook with heart-healthy oils, such as olive, canola, avocado, soybean, or sunflower oil. Meal planning  Eat a balanced diet that includes: 4 or more servings of fruits and 4 or more servings of vegetables each day. Try to fill one-half of your plate with fruits and vegetables. 6-8 servings of whole grains each day. Less than 6 oz (170 g) of lean meat, poultry, or fish each day. A 3-oz (85-g) serving of meat is about the same size as a deck of cards. One egg equals 1 oz (28 g). 2-3 servings of low-fat dairy each day. One serving is 1 cup (237 mL). 1 serving of nuts, seeds, or beans 5 times each week. 2-3 servings of heart-healthy fats. Healthy fats called omega-3 fatty acids are found in foods such as walnuts, flaxseeds, fortified milks, and eggs.  These fats are also found in cold-water fish, such as sardines, salmon, and mackerel. Limit how much you eat of: Canned or prepackaged foods. Food that is high in trans fat, such as some fried foods. Food that is high in saturated fat, such as fatty meat. Desserts and other sweets, sugary drinks, and other foods with added sugar. Full-fat dairy products. Do not salt foods before eating. Do not eat more than 4 egg yolks a week. Try to eat at least 2 vegetarian meals a week. Eat more home-cooked food and less restaurant, buffet, and fast food.  Lifestyle When eating at a restaurant, ask that your food be prepared with less salt or no salt, if possible. If you drink alcohol: Limit how much you use to: 0-1 drink a day for women who are not pregnant. 0-2 drinks a day for men. Be aware of how much alcohol is in your drink. In the [place] one drink equals one 12 oz bottle of beer (355 mL), one 5 oz glass of wine (148 mL), or one 1 oz glass of hard liquor (44 mL). General information Avoid eating more than 2,300 mg of salt a day. If you have hypertension, you may need to reduce your sodium intake to 1,500 mg a day. Work with your health care provider to maintain a healthy body weight or to lose weight. Ask what an ideal weight is for you. Get at least 30 minutes of exercise that causes your heart to beat faster (aerobic exercise) most days of the week. Activities may include walking, swimming, or biking. Work with your health care provider   or dietitian to adjust your eating plan to your individual calorie needs. What foods should I eat? Fruits All fresh, dried, or frozen fruit. Canned fruit in natural juice (without addedsugar). Vegetables Fresh or frozen vegetables (raw, steamed, roasted, or grilled). Low-sodium or reduced-sodium tomato and vegetable juice. Low-sodium or reduced-sodium tomatosauce and tomato paste. Low-sodium or reduced-sodium canned vegetables. Grains Whole-grain or  whole-wheat bread. Whole-grain or whole-wheat pasta. Brown rice. Oatmeal. Quinoa. Bulgur. Whole-grain and low-sodium cereals. Pita bread.Low-fat, low-sodium crackers. Whole-wheat flour tortillas. Meats and other proteins Skinless chicken or turkey. Ground chicken or turkey. Pork with fat trimmed off. Fish and seafood. Egg whites. Dried beans, peas, or lentils. Unsalted nuts, nut butters, and seeds. Unsalted canned beans. Lean cuts of beef with fat trimmed off. Low-sodium, lean precooked or cured meat, such as sausages or meatloaves. Dairy Low-fat (1%) or fat-free (skim) milk. Reduced-fat, low-fat, or fat-free cheeses. Nonfat, low-sodium ricotta or cottage cheese. Low-fat or nonfatyogurt. Low-fat, low-sodium cheese. Fats and oils Soft margarine without trans fats. Vegetable oil. Reduced-fat, low-fat, or light mayonnaise and salad dressings (reduced-sodium). Canola, safflower, olive, avocado, soybean, andsunflower oils. Avocado. Seasonings and condiments Herbs. Spices. Seasoning mixes without salt. Other foods Unsalted popcorn and pretzels. Fat-free sweets. The items listed above may not be a complete list of foods and beverages you can eat. Contact a dietitian for more information. What foods should I avoid? Fruits Canned fruit in a light or heavy syrup. Fried fruit. Fruit in cream or buttersauce. Vegetables Creamed or fried vegetables. Vegetables in a cheese sauce. Regular canned vegetables (not low-sodium or reduced-sodium). Regular canned tomato sauce and paste (not low-sodium or reduced-sodium). Regular tomato and vegetable juice(not low-sodium or reduced-sodium). Pickles. Olives. Grains Baked goods made with fat, such as croissants, muffins, or some breads. Drypasta or rice meal packs. Meats and other proteins Fatty cuts of meat. Ribs. Fried meat. Bacon. Bologna, salami, and other precooked or cured meats, such as sausages or meat loaves. Fat from the back of a pig (fatback). Bratwurst.  Salted nuts and seeds. Canned beans with added salt. Canned orsmoked fish. Whole eggs or egg yolks. Chicken or turkey with skin. Dairy Whole or 2% milk, cream, and half-and-half. Whole or full-fat cream cheese. Whole-fat or sweetened yogurt. Full-fat cheese. Nondairy creamers. Whippedtoppings. Processed cheese and cheese spreads. Fats and oils Butter. Stick margarine. Lard. Shortening. Ghee. Bacon fat. Tropical oils, suchas coconut, palm kernel, or palm oil. Seasonings and condiments Onion salt, garlic salt, seasoned salt, table salt, and sea salt. Worcestershire sauce. Tartar sauce. Barbecue sauce. Teriyaki sauce. Soy sauce, including reduced-sodium. Steak sauce. Canned and packaged gravies. Fish sauce. Oyster sauce. Cocktail sauce. Store-bought horseradish. Ketchup. Mustard. Meat flavorings and tenderizers. Bouillon cubes. Hot sauces. Pre-made or packaged marinades. Pre-made or packaged taco seasonings. Relishes. Regular saladdressings. Other foods Salted popcorn and pretzels. The items listed above may not be a complete list of foods and beverages you should avoid. Contact a dietitian for more information. Where to find more information National Heart, Lung, and Blood Institute: www.nhlbi.nih.gov American Heart Association: www.heart.org Academy of Nutrition and Dietetics: www.eatright.org National Kidney Foundation: www.kidney.org Summary The DASH eating plan is a healthy eating plan that has been shown to reduce high blood pressure (hypertension). It may also reduce your risk for type 2 diabetes, heart disease, and stroke. When on the DASH eating plan, aim to eat more fresh fruits and vegetables, whole grains, lean proteins, low-fat dairy, and heart-healthy fats. With the DASH eating plan, you should limit salt (sodium) intake to 2,300   mg a day. If you have hypertension, you may need to reduce your sodium intake to 1,500 mg a day. Work with your health care provider or dietitian to adjust  your eating plan to your individual calorie needs. This information is not intended to replace advice given to you by your health care provider. Make sure you discuss any questions you have with your healthcare provider. Document Revised: 01/05/2019 Document Reviewed: 01/05/2019 [employer]vier Patient Education  2022 [employer]  

## 2020-09-03 NOTE — Assessment & Plan Note (Signed)
Continue Losartan for kidney protection and recheck urine and CMP next visit.

## 2020-09-03 NOTE — Assessment & Plan Note (Signed)
Chronic, ongoing. BP in office today below goal.  Recommend she monitor at least a few times a week at home.  Continue Losartan 25 MG daily and adjust as needed -- she did not tolerate increase in this dose to 50 MG in past and had dizziness.   Focus on DASH diet at home.  Return to office in 3 months for BP check and reassurance + labs.  Unable to obtain labs today.

## 2020-09-19 ENCOUNTER — Telehealth: Payer: Medicare Other | Admitting: General Practice

## 2020-09-19 ENCOUNTER — Ambulatory Visit (INDEPENDENT_AMBULATORY_CARE_PROVIDER_SITE_OTHER): Payer: Medicare Other | Admitting: General Practice

## 2020-09-19 DIAGNOSIS — F4321 Adjustment disorder with depressed mood: Secondary | ICD-10-CM | POA: Diagnosis not present

## 2020-09-19 DIAGNOSIS — F324 Major depressive disorder, single episode, in partial remission: Secondary | ICD-10-CM | POA: Diagnosis not present

## 2020-09-19 DIAGNOSIS — I10 Essential (primary) hypertension: Secondary | ICD-10-CM | POA: Diagnosis not present

## 2020-09-19 DIAGNOSIS — E782 Mixed hyperlipidemia: Secondary | ICD-10-CM | POA: Diagnosis not present

## 2020-09-19 NOTE — Chronic Care Management (AMB) (Signed)
Chronic Care Management   CCM RN Visit Note  09/19/2020 Name: Carol [name] MRN: [id number] DOB: [birth year]  Subjective: Carol [name] is a 70 y.o. year old female who is a primary care patient of Cannady, Barbaraann Faster, NP. The care management team was consulted for assistance with disease management and care coordination needs.    Engaged with patient by telephone for follow up visit in response to provider referral for case management and/or care coordination services.   Consent to Services:  The patient was given information about Chronic Care Management services, agreed to services, and gave verbal consent prior to initiation of services.  Please see initial visit note for detailed documentation.   Patient agreed to services and verbal consent obtained.   Assessment: Review of patient past medical history, allergies, medications, health status, including review of consultants reports, laboratory and other test data, was performed as part of comprehensive evaluation and provision of chronic care management services.   SDOH (Social Determinants of Health) assessments and interventions performed:    CCM Care Plan  No Known Allergies  Outpatient Encounter Medications as of 09/19/2020  Medication Sig   aspirin 81 MG tablet Take 81 mg by mouth daily.   atorvastatin (LIPITOR) 10 MG tablet TAKE 1 TABLET BY MOUTH EVERY OTHER DAY   ciclopirox (PENLAC) 8 % solution Apply topically at bedtime. Apply over nail and surrounding skin. Apply daily over previous coat. After seven (7) days, may remove with alcohol and continue cycle.   fluticasone (FLONASE) 50 MCG/ACT nasal spray SPRAY 2 SPRAYS INTO EACH NOSTRIL ONCE A DAY   guaiFENesin (ROBITUSSIN) 100 MG/5ML SOLN Take 5 mLs (100 mg total) by mouth every 4 (four) hours as needed for cough or to loosen phlegm.   loratadine (CLARITIN) 10 MG tablet Take 10 mg by mouth daily.   losartan (COZAAR) 50 MG tablet Take 0.5 tablets (25 mg total) by mouth daily.    meclizine (ANTIVERT) 25 MG tablet Take 1 tablet (25 mg total) by mouth 3 (three) times daily as needed for dizziness.   meloxicam (MOBIC) 15 MG tablet Take 15 mg by mouth as needed for pain.   triamcinolone (KENALOG) 0.1 % Apply 1 application topically 2 (two) times daily.   No facility-administered encounter medications on file as of 09/19/2020.    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    Conditions to be addressed/monitored:HTN, HLD, and Depression  Care Plan : RNCM: Hypertension (Adult)  Updates made by Vanita Ingles since 09/19/2020 12:00 AM     Problem: RNCM: Hypertension (Hypertension)   Priority: Medium     Long-Range Goal: RNCM: Hypertension Monitored   Start Date: 06/03/2020  Expected End Date: 06/03/2021  This Visit's Progress: On track  Priority: Medium  Note:   Objective:  Last practice recorded BP readings:  BP Readings from Last 3 Encounters:  09/03/20 122/72  05/22/20 128/78  03/18/20 130/68    Most recent eGFR/CrCl: No results found for: EGFR  No components found for: CRCL Current Barriers:  Knowledge Deficits related to basic understanding of hypertension pathophysiology and self care management Knowledge Deficits related to understanding of medications prescribed for management of hypertension Limited Social Support Lacks social connections Does not maintain contact with provider office Does not contact provider office for questions/concerns Case Manager  Clinical Goal(s):  patient will verbalize understanding of plan for hypertension management patient will attend all scheduled medical appointments: 02-03-2021 at 1040 am patient will demonstrate improved adherence to prescribed treatment plan for hypertension as evidenced by taking all medications as prescribed, monitoring and recording blood pressure as directed, adhering to low sodium/DASH diet patient will demonstrate improved health management independence as evidenced by checking blood pressure as directed and notifying PCP if SBP>160 or DBP > 90, taking all medications as prescribe, and adhering to a low sodium diet as discussed. patient will verbalize basic understanding of hypertension disease process and self health management plan as evidenced by taking medications as prescribed, adhering to heart healthy diet, and working with CCM team for management of Chronic health conditions Interventions:  Collaboration with Cannady, Jolene T, NP regarding development and update of comprehensive plan of care as evidenced by provider attestation and co-signature Inter-disciplinary care team collaboration (see longitudinal plan of care) Evaluation of current treatment plan related to hypertension self management and patient's adherence to plan as established by provider. 09-19-2020: The patient  is doing better at managing her blood pressues. She is coping fairly well with the loss of her husband. She says things are harder now going from 2 incomes to one but she is going to start a part time job soon. She states that she is eating well and sleeping well. Denies any acute issues today. Will continue to monitor.  Provided education to patient re: stroke prevention, s/s of heart attack and stroke, DASH diet, complications of uncontrolled blood pressure. 09-19-2020: Is compliant with heart healthy diet.  Reviewed medications with patient and discussed importance of compliance. 09-19-2020: Is compliant with  medications. Denies any issues with medications  Discussed plans with patient for ongoing care management follow up and provided patient with direct contact information for care management team Advised patient, providing education and rationale, to monitor blood pressure daily and record, calling PCP for findings outside established parameters.  Reviewed scheduled/upcoming provider appointments including: 02-03-2021 at 1040 am Self-Care Activities: - Self administers medications as prescribed Attends all scheduled provider appointments Calls provider office for new concerns, questions, or BP outside discussed parameters Checks BP and records as discussed Follows a low sodium diet/DASH diet Patient Goals: - check blood pressure weekly - choose a place to take my blood pressure (home, clinic or office, retail store) - write blood pressure results in a log or diary - agree on reward when goals are met - agree to work together to make changes - ask questions to understand - have a family meeting to talk about healthy habits - learn about high blood pressure - blood pressure trends reviewed - depression screen reviewed - home or ambulatory blood pressure monitoring encouraged  Follow Up Plan: Telephone follow up appointment with care management team member scheduled for: 12-05-2020 at 0900 am    Task: RNCM: Identify and Monitor Blood Pressure Elevation Completed 09/19/2020  Outcome: Positive  Note:   Care Management Activities:    - blood pressure trends reviewed - depression screen reviewed - home or ambulatory blood pressure monitoring encouraged        Care Plan : RNCM: Depression (Adult)  Updates made by Tate, Pamela J   since 09/19/2020 12:00 AM     Problem: RNCM: Depression Identification (Depression) and Grieving   Priority: Medium     Long-Range Goal: RNCM: Depressive Symptoms Identified   Start Date: 06/03/2020  Expected End Date: 09/14/2021  This Visit's Progress: On  track  Priority: High  Note:   Current Barriers:  Chronic Disease Management support and education needs related to depression and grief Lacks caregiver support.  Unable to independently manage depression and grief  Lacks social connections Does not maintain contact with provider office Does not contact provider office for questions/concerns  Nurse Case Manager Clinical Goal(s):  patient will verbalize understanding of plan for effective management of depression and grief  patient will work with [facility] and pcp to address needs related to effective management of depression and grief  patient will attend all scheduled medical appointments: 02-03-2021 at 1040 am  Interventions:  1:1 collaboration with Venita Lick, NP regarding development and update of comprehensive plan of care as evidenced by provider attestation and co-signature Inter-disciplinary care team collaboration (see longitudinal plan of care) Evaluation of current treatment plan related to depression and grief  and patient's adherence to plan as established by provider. 09-19-2020: The patient states she is doing well. 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 She states going from two incomes to one has been hard. She gets some food stamps and that is helpful. Review of SDOH and the patient denies any acute needs. Review of care guides and helping with community resources, the patient verbalized understanding. Will continue to monitor for changes.  Advised patient to call the office for changes in mood, anxiety, or depression Provided education to patient re: reaching out to [facility] for support for depression and grief  Reviewed scheduled/upcoming provider appointments including:  Discussed plans with patient for ongoing care management follow up and provided patient with direct contact information for care management team  Patient Goals/Self-Care Activities Over the next 120 days, patient  will:  - Patient will self administer medications as prescribed Patient will attend all scheduled provider appointments Patient will call pharmacy for medication refills Patient will attend church or other social activities Patient will continue to perform ADL's independently Patient will continue to perform IADL's independently Patient will call provider office for new concerns or questions Patient will work with BSW to address care coordination needs and will continue to work with the clinical team to address health care and disease management related needs.   - anxiety screen reviewed - depression screen reviewed - medication list reviewed  Follow Up Plan: Telephone follow up appointment with care management team member scheduled for: 12-05-2020 at 0900 am        Task: RNCM: Identify Depressive Symptoms and Facilitate Treatment Completed 09/19/2020  Outcome: Positive  Note:   Care Management Activities:    - anxiety screen reviewed - depression screen reviewed - medication list reviewed        Care Plan : RNCM: HLD management  Updates made by Vanita Ingles since 09/19/2020 12:00 AM     Problem: RNCM: HLD Management   Priority: Medium     Long-Range Goal: RNCM: HLD Management   Start Date: 06/03/2020  Expected End Date: 06/03/2021  This Visit's Progress: On track  Priority: Medium  Note:   Current Barriers:  Poorly controlled hyperlipidemia, complicated by grief, statin use every other day Current antihyperlipidemic regimen: Lipitor 10 mg every other day Most recent lipid panel:  Component Value Date/Time   CHOL 150 03/18/2020 1352   TRIG 91 03/18/2020 1352   HDL 48 03/18/2020 1352   LDLCALC 85 03/18/2020 1352   ASCVD risk enhancing conditions: age >65,  HTN Lacks social connections Does not maintain contact with provider office Does not contact provider office for questions/concerns RN Care Manager Clinical Goal(s):  patient will work with RN Care  Manager, providers, and care team towards execution of optimized self-health management plan patient will verbalize understanding of plan for effective management of HLD patient will work with RNCM and pcp to address needs related to HLD patient will attend all scheduled medical appointments: 02-03-2021 at 1040am Interventions: Collaboration with Cannady, Jolene T, NP regarding development and update of comprehensive plan of care as evidenced by provider attestation and co-signature Inter-disciplinary care team collaboration (see longitudinal plan of care) Medication review performed; medication list updated in electronic medical record.  Inter-disciplinary care team collaboration (see longitudinal plan of care) Referred to pharmacy team for assistance with HLD medication management Evaluation of current treatment plan related to HLD and patient's adherence to plan as established by provider. 09-19-2020: The patient saw the pcp in July and got a good report. They were unable to get her blood work because her veins were rolling. The patient states that they will get it when she goes back in December. She denies any acute issues related to her health. Is managing well at this time. Will continue to monitor.  Advised patient to call the office for questions or concerns Provided education to patient re: heart healthy diet and taking medications as prescribed  Reviewed medications with patient and discussed compliance, the patient is takes statins every other day. 09-19-2020: The patient is compliant with medications.  Provided patient with HLD educational materials related to effective management of HLD Reviewed scheduled/upcoming provider appointments including: 02-03-2021 at 1040am Discussed plans with patient for ongoing care management follow up and provided patient with direct contact information for care management team Patient Goals/Self-Care Activities: - call for medicine refill 2 or 3 days before  it runs out - call if I am sick and can't take my medicine - keep a list of all the medicines I take; vitamins and herbals too - learn to read medicine labels - use a pillbox to sort medicine - use an alarm clock or phone to remind me to take my medicine - change to whole grain breads, cereal, pasta - drink 6 to 8 glasses of water each day - eat 3 to 5 servings of fruits and vegetables each day - eat 5 or 6 small meals each day - fill half the plate with nonstarchy vegetables - limit fast food meals to no more than 1 per week - manage portion size - prepare main meal at home 3 to 5 days each week - read food labels for fat, fiber, carbohydrates and portion size - switch to low-fat or skim milk - switch to sugar-free drinks - be open to making changes - I can manage, know and watch for signs of a heart attack - if I have chest pain, call for help - learn about small changes that will make a big difference - learn my personal risk factors - barriers to meeting goals identified - change-talk evoked - choices provided - collaboration with team encouraged - decision-making supported - difficulty of making life-long changes acknowledged - health risks reviewed - problem-solving facilitated - questions answered - readiness for change evaluated - reassurance provided - self-reflection promoted -   self-reliance encouraged  Follow Up Plan: Telephone follow up appointment with care management team member scheduled for: 12-05-2020 at 0900 am      Task: RNCM: Mutually Develop and Royce Macadamia Achievement of Patient Goals Completed 09/19/2020  Outcome: Positive  Note:   Care Management Activities:    - barriers to meeting goals identified - change-talk evoked - choices provided - collaboration with team encouraged - decision-making supported - difficulty of making life-long changes acknowledged - health risks reviewed - problem-solving facilitated - questions answered - readiness for  change evaluated - reassurance provided - self-reflection promoted - self-reliance encouraged         Plan:Telephone follow up appointment with care management team member scheduled for:  12-05-2020 at 0900 am  [place], MSN, [facility] Mobile: [phone]

## 2020-09-19 NOTE — Patient Instructions (Signed)
Visit Information  PATIENT GOALS:  Goals Addressed             This Visit's Progress    RNCM: Track emoition from grief and depresion       Timeframe:  Long-Range Goal Priority:  Medium Start Date:     09-19-2020                        Expected End Date:        09-19-2021               Follow Up Date 12/05/2020    - check out counseling -do a hobby that you enjoy doing -spend time with family and friends -talk opening about your feelings  -seek help for changes in anxiety, mood, or depression    Why is this important?   Beating depression may take some time.  If you don't feel better right away, don't give up on your treatment plan.    Notes: 09-19-2020: The patient lost her husband on 02-16-2020.  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 She states she is doing fairly well and her blood pressures have stabilized out.         The patient verbalized understanding of instructions, educational materials, and care plan provided today and declined offer to receive copy of patient instructions, educational materials, and care plan.   Telephone follow up appointment with care management team member scheduled for: 12-05-2020 at 0900 am  Alto Denver RN, MSN, CCM Community Care Coordinator [place]  [facility] Mobile: [phone]

## 2020-09-22 ENCOUNTER — Other Ambulatory Visit: Payer: Self-pay | Admitting: Nurse Practitioner

## 2020-09-22 NOTE — Telephone Encounter (Signed)
   Notes to clinic:  medication filled by a historical provider  Review for continued use and refill    Requested Prescriptions  Pending Prescriptions Disp Refills   meloxicam (MOBIC) 15 MG tablet [Pharmacy Med Name: MELOXICAM 15 MG TAB] 90 tablet     Sig: TAKE 1 TABLET BY MOUTH DAILY      Analgesics:  COX2 Inhibitors Failed - 09/22/2020 12:57 PM      Failed - HGB in normal range and within 360 days    Hemoglobin  Date Value Ref Range Status  12/14/2017 12.6 11.1 - 15.9 g/dL Final          Passed - Cr in normal range and within 360 days    Creatinine  Date Value Ref Range Status  12/20/2013 0.72 0.60 - 1.30 mg/dL Final   Creatinine, Ser  Date Value Ref Range Status  03/18/2020 0.84 0.57 - 1.00 mg/dL Final          Passed - Patient is not pregnant      Passed - Valid encounter within last 12 months    Recent Outpatient Visits           2 weeks ago Primary hypertension   [facility] [place], Lind T, NP   3 months ago Viral upper respiratory tract infection   [facility] Larae Grooms, NP   4 months ago Primary hypertension   [facility] [place], Bedford T, NP   6 months ago Primary hypertension   [facility] [place], Lloyd T, NP   7 months ago Primary hypertension   [facility] [place], Dorie Rank, NP       Future Appointments             In 4 months Cannady, Dorie Rank, NP [facility]   In 11 months  [facility]

## 2020-10-27 ENCOUNTER — Other Ambulatory Visit: Payer: Self-pay | Admitting: Nurse Practitioner

## 2020-10-29 ENCOUNTER — Ambulatory Visit: Payer: Medicare Other | Attending: Otolaryngology

## 2020-11-10 ENCOUNTER — Telehealth: Payer: Self-pay

## 2020-11-10 ENCOUNTER — Encounter: Payer: Self-pay | Admitting: Nurse Practitioner

## 2020-11-10 ENCOUNTER — Other Ambulatory Visit: Payer: Self-pay

## 2020-11-10 ENCOUNTER — Ambulatory Visit (INDEPENDENT_AMBULATORY_CARE_PROVIDER_SITE_OTHER): Payer: Medicare Other | Admitting: Nurse Practitioner

## 2020-11-10 DIAGNOSIS — J069 Acute upper respiratory infection, unspecified: Secondary | ICD-10-CM

## 2020-11-10 MED ORDER — PREDNISONE 20 MG PO TABS: 40.0000 mg | ORAL_TABLET | Freq: Every day | ORAL | 0 refills | Status: AC

## 2020-11-10 NOTE — Progress Notes (Signed)
BP 132/80 (BP Location: Left Arm, Patient Position: Sitting, Cuff Size: Normal)   Temp 98.3 F (36.8 C) (Oral)   Wt 159 lb 3.2 oz (72.2 kg)   LMP  (LMP Unknown)   SpO2 97%   BMI 28.56 kg/m    Subjective:    Patient ID: Carol [name], female    DOB: [birth year], 70 y.o.   MRN: [id number]  HPI: Carol [name] is a 70 y.o. female  Chief Complaint  Patient presents with   Cough   Nasal Congestion    Patient states she became symptomatic on Saturday and states she took an at-home test yesterday and states the test was negative. Patient denies being around anyone who may have tested positive. Patient states she has drainage coming up when she coughs and states it is clear. Patient denies having any fever or chills.    UPPER RESPIRATORY TRACT INFECTION Started feeling bad on Saturday -- runny nose and sneezing.  Tested negative at home for Covid.  Has had one Covid vaccine on 05/04/19.  Continues  on Claritin and Flonase.   Fever: no Cough: yes Shortness of breath: no Wheezing: no Chest pain: no Chest tightness: no Chest congestion: no Nasal congestion: yes Runny nose: no Post nasal drip: yes Sneezing: yes Sore throat: no Swollen glands: no Sinus pressure: yes Headache: yes Face pain: no Toothache: no Ear pain: none Ear pressure: none Eyes red/itching:no Eye drainage/crusting: no  Vomiting: no Rash: no Fatigue: no Sick contacts: no Strep contacts: no  Context: stable Recurrent sinusitis: no Relief with OTC cold/cough medications: no  Treatments attempted: cough syrup    Relevant past medical, surgical, family and social history reviewed and updated as indicated. Interim medical history since our last visit reviewed. Allergies and medications reviewed and updated.  Review of Systems  Constitutional:  Negative for activity change, appetite change, chills, fatigue and fever.  HENT:  Positive for congestion, postnasal drip, rhinorrhea and sinus pressure. Negative  for ear discharge, ear pain, facial swelling, sinus pain, sneezing, sore throat and voice change.   Eyes:  Negative for pain and visual disturbance.  Respiratory:  Positive for cough and chest tightness. Negative for shortness of breath and wheezing.   Cardiovascular:  Negative for chest pain, palpitations and leg swelling.  Gastrointestinal: Negative.   Musculoskeletal:  Negative for myalgias.  Neurological:  Positive for headaches. Negative for dizziness and numbness.  Psychiatric/Behavioral: Negative.     Per HPI unless specifically indicated above     Objective:    BP 132/80 (BP Location: Left Arm, Patient Position: Sitting, Cuff Size: Normal)   Temp 98.3 F (36.8 C) (Oral)   Wt 159 lb 3.2 oz (72.2 kg)   LMP  (LMP Unknown)   SpO2 97%   BMI 28.56 kg/m   Wt Readings from Last 3 Encounters:  11/10/20 159 lb 3.2 oz (72.2 kg)  09/03/20 160 lb 6.4 oz (72.8 kg)  08/25/20 152 lb (68.9 kg)    Physical Exam Vitals and nursing note reviewed.  Constitutional:      General: She is awake. She is not in acute distress.    Appearance: She is well-developed, well-groomed and overweight. She is not ill-appearing or toxic-appearing.  HENT:     Head: Normocephalic.     Right Ear: Hearing, tympanic membrane, ear canal and external ear normal.     Left Ear: Hearing, tympanic membrane, ear canal and external ear normal.     Nose: Rhinorrhea present. Rhinorrhea is clear.  Right Sinus: No maxillary sinus tenderness or frontal sinus tenderness.     Left Sinus: No maxillary sinus tenderness or frontal sinus tenderness.     Mouth/Throat:     Mouth: Mucous membranes are moist.     Pharynx: No pharyngeal swelling, oropharyngeal exudate or posterior oropharyngeal erythema.     Tonsils: No tonsillar exudate. 2+ on the right. 2+ on the left.  Eyes:     General: Lids are normal.        Right eye: No discharge.        Left eye: No discharge.     Conjunctiva/sclera: Conjunctivae normal.      Pupils: Pupils are equal, round, and reactive to light.  Neck:     Vascular: No carotid bruit.  Cardiovascular:     Rate and Rhythm: Normal rate and regular rhythm.     Heart sounds: Normal heart sounds. No murmur heard.   No gallop.  Pulmonary:     Effort: Pulmonary effort is normal. No accessory muscle usage or respiratory distress.     Breath sounds: Normal breath sounds.  Abdominal:     General: Bowel sounds are normal.     Palpations: Abdomen is soft.  Musculoskeletal:     Cervical back: Normal range of motion and neck supple.     Right lower leg: No edema.     Left lower leg: No edema.  Skin:    General: Skin is warm and dry.  Neurological:     Mental Status: She is alert and oriented to person, place, and time.  Psychiatric:        Attention and Perception: Attention normal.        Mood and Affect: Mood normal.        Speech: Speech normal.        Behavior: Behavior normal. Behavior is cooperative.        Thought Content: Thought content normal.    Results for orders placed or performed in visit on 05/22/20  WET PREP FOR TRICH, YEAST, CLUE   Specimen: Sterile Swab   Sterile Swab  Result Value Ref Range   Trichomonas Exam Negative Negative   Yeast Exam Negative Negative   Clue Cell Exam Positive (A) Negative      Assessment & Plan:   Problem List Items Addressed This Visit       Respiratory   URI (upper respiratory infection)    Acute for 3 days -- Covid test at home negative -- she refuses PCR testing in office today and refuses Flu testing.  At this time will send in Prednisone 40 MG x 5 days for inflammation and sinus irritation.  No abx at this time as on day 3, if in 4 days symptoms continue will consider abx therapy.  Recommend: - Increased rest - Increasing Fluids - Acetaminophen for fever/pain.  - Mucinex.  - Saline sinus flushes or a neti pot.  - Humidifying the air Return to office for worsening or ongoing symptoms.          Follow up  plan: Return if symptoms worsen or fail to improve.

## 2020-11-10 NOTE — Telephone Encounter (Signed)
Noted  

## 2020-11-10 NOTE — Patient Instructions (Signed)
Upper Respiratory Infection, Adult  An upper respiratory infection (URI) affects the nose, throat, and upper air passages. URIs are caused by germs (viruses). The most common type of URI is often called "the common cold."  Medicines cannot cure URIs, but you can do things at home to relieve your symptoms. URIs usually get better within 7-10 days.  Follow these instructions at home:  Activity  Rest as needed.  If you have a fever, stay home from work or school until your fever is gone, or until your doctor says you may return to work or school.  You should stay home until you cannot spread the infection anymore (you are not contagious).  Your doctor may have you wear a face mask so you have less risk of spreading the infection.  Relieving symptoms  Gargle with a salt-water mixture 3-4 times a day or as needed. To make a salt-water mixture, completely dissolve -1 tsp of salt in 1 cup of warm water.  Use a cool-mist humidifier to add moisture to the air. This can help you breathe more easily.  Eating and drinking    Drink enough fluid to keep your pee (urine) pale yellow.  Eat soups and other clear broths.  General instructions    Take over-the-counter and prescription medicines only as told by your doctor. These include cold medicines, fever reducers, and cough suppressants.  Do not use any products that contain nicotine or tobacco. These include cigarettes and e-cigarettes. If you need help quitting, ask your doctor.  Avoid being where people are smoking (avoid secondhand smoke).  Make sure you get regular shots and get the flu shot every year.  Keep all follow-up visits as told by your doctor. This is important.  How to avoid spreading infection to others    Wash your hands often with soap and water. If you do not have soap and water, use hand sanitizer.  Avoid touching your mouth, face, eyes, or nose.  Cough or sneeze into a tissue or your sleeve or elbow. Do not cough or sneeze into your hand or into the  air.  Contact a doctor if:  You are getting worse, not better.  You have any of these:  A fever.  Chills.  Brown or red mucus in your nose.  Yellow or brown fluid (discharge)coming from your nose.  Pain in your face, especially when you bend forward.  Swollen neck glands.  Pain with swallowing.  White areas in the back of your throat.  Get help right away if:  You have shortness of breath that gets worse.  You have very bad or constant:  Headache.  Ear pain.  Pain in your forehead, behind your eyes, and over your cheekbones (sinus pain).  Chest pain.  You have long-lasting (chronic) lung disease along with any of these:  Wheezing.  Long-lasting cough.  Coughing up blood.  A change in your usual mucus.  You have a stiff neck.  You have changes in your:  Vision.  Hearing.  Thinking.  Mood.  Summary  An upper respiratory infection (URI) is caused by a germ called a virus. The most common type of URI is often called "the common cold."  URIs usually get better within 7-10 days.  Take over-the-counter and prescription medicines only as told by your doctor.  This information is not intended to replace advice given to you by your health care provider. Make sure you discuss any questions you have with your health care provider.  Document   Revised: 10/11/2019 Document Reviewed: 10/11/2019  Elsevier Patient Education  2022 [employer]

## 2020-11-10 NOTE — Assessment & Plan Note (Signed)
Acute for 3 days -- Covid test at home negative -- she refuses PCR testing in office today and refuses Flu testing.  At this time will send in Prednisone 40 MG x 5 days for inflammation and sinus irritation.  No abx at this time as on day 3, if in 4 days symptoms continue will consider abx therapy.  Recommend: - Increased rest - Increasing Fluids - Acetaminophen for fever/pain.  - Mucinex.  - Saline sinus flushes or a neti pot.  - Humidifying the air Return to office for worsening or ongoing symptoms.

## 2020-11-10 NOTE — Telephone Encounter (Signed)
Copied from CRM [id number]. Topic: General - Inquiry >> Nov 10, 2020  8:08 AM Aretta Nip wrote: Reason for CRM: Pt has called in with a bad cold, stopped up, coughing, runny nose no fever. States took a covid test negative, pt wants an antibiotic, last seen in July. Offered pt a virtual appt but states she wants me to ask Jolene b/c this happens to her every year and Jolene knows it is just her sinus. Informed pt may have to have appt but still wants to let Jolene know first FU with pt at [phone]

## 2020-11-18 ENCOUNTER — Telehealth: Payer: Self-pay | Admitting: Nurse Practitioner

## 2020-11-18 MED ORDER — AMOXICILLIN-POT CLAVULANATE 875-125 MG PO TABS: 1.0000 | ORAL_TABLET | Freq: Two times a day (BID) | ORAL | 0 refills | Status: AC

## 2020-11-18 NOTE — Addendum Note (Signed)
Addended by: Aura Dials T on: 11/18/2020 12:12 PM   Modules accepted: Orders

## 2020-11-18 NOTE — Telephone Encounter (Signed)
Copied from CRM [id number]. Topic: General - Other >> Nov 17, 2020  8:30 AM Jaquita Rector A wrote: Reason for CRM: Patient called in to inform Aura Dials that she is ready for the antibiotic to be called in to the pharmacy this morning. Please call when done at Ph# [phone] >> Nov 18, 2020 11:08 AM Jaquita Rector A wrote: Patient called in again to inform Aura Dials that she is still waiting for the Rx to be sent to the pharmacy for the antibiotics. Please advise

## 2020-11-21 NOTE — Telephone Encounter (Signed)
Informed patient prescription was sent to pharmacy. 

## 2020-12-05 ENCOUNTER — Telehealth: Payer: Self-pay

## 2020-12-05 NOTE — Telephone Encounter (Signed)
Patient returned call, not sure what this is about. Is this about an appt in December. Please call back ot confirm

## 2020-12-05 NOTE — Telephone Encounter (Signed)
  Care Management   Follow Up Note   12/05/2020 Name: [name] MRN: [id number] DOB: 03/14/1950   Referred by: Marjie Skiff, NP Reason for referral : Chronic Care Management (RNCM: Follow up for Chronic Disease Management and Care Coordination Needs )   An unsuccessful telephone outreach was attempted today. The patient was referred to the case management team for assistance with care management and care coordination.   Follow Up Plan: A HIPPA compliant phone message was left for the patient providing contact information and requesting a return call.   Alto Denver RN, MSN, CCM Community Care Coordinator [place]  [facility] Mobile: [phone]

## 2020-12-31 ENCOUNTER — Ambulatory Visit (INDEPENDENT_AMBULATORY_CARE_PROVIDER_SITE_OTHER): Payer: Medicare Other

## 2020-12-31 ENCOUNTER — Telehealth: Payer: Medicare Other | Admitting: General Practice

## 2020-12-31 DIAGNOSIS — M1990 Unspecified osteoarthritis, unspecified site: Secondary | ICD-10-CM

## 2020-12-31 DIAGNOSIS — M25561 Pain in right knee: Secondary | ICD-10-CM

## 2020-12-31 DIAGNOSIS — G8929 Other chronic pain: Secondary | ICD-10-CM

## 2020-12-31 DIAGNOSIS — I10 Essential (primary) hypertension: Secondary | ICD-10-CM

## 2020-12-31 DIAGNOSIS — E782 Mixed hyperlipidemia: Secondary | ICD-10-CM

## 2020-12-31 NOTE — Patient Instructions (Signed)
Visit Information  Care Plan : RNCM: Hypertension (Adult)  Updates made by Vanita Ingles, RN since 12/31/2020 12:00 AM  Completed 12/31/2020   Problem: RNCM: Hypertension (Hypertension) Resolved 12/31/2020  Priority: Medium     Long-Range Goal: RNCM: Hypertension Monitored Completed 12/31/2020  Start Date: 06/03/2020  Expected End Date: 06/03/2021  Recent Progress: On track  Priority: Medium  Note:   Objective: Resolving, duplicate goal  Last practice recorded BP readings:  BP Readings from Last 3 Encounters:  09/03/20 122/72  05/22/20 128/78  03/18/20 130/68    Most recent eGFR/CrCl: No results found for: EGFR  No components found for: CRCL Current Barriers:  Knowledge Deficits related to basic understanding of hypertension pathophysiology and self care management Knowledge Deficits related to understanding of medications prescribed for management of hypertension Limited Social Support Lacks social connections Does not maintain contact with provider office Does not contact provider office for questions/concerns Case Manager Clinical Goal(s):  patient will verbalize understanding of plan for hypertension management patient will attend all scheduled medical appointments: 02-03-2021 at 1040 am patient will demonstrate improved adherence to prescribed treatment plan for hypertension as evidenced by taking all medications as prescribed, monitoring and recording blood pressure as directed, adhering to low sodium/DASH diet patient will demonstrate improved health management independence as evidenced by checking blood pressure as directed and notifying PCP if SBP>160 or DBP > 90, taking all medications as prescribe, and adhering to a low sodium diet as discussed. patient will verbalize basic understanding of hypertension disease process and self health management plan as evidenced by taking medications as prescribed, adhering to heart healthy diet, and working with CCM team for management  of Chronic health conditions Interventions:  Collaboration with Venita Lick, NP regarding development and update of comprehensive plan of care as evidenced by provider attestation and co-signature Inter-disciplinary care team collaboration (see longitudinal plan of care) Evaluation of current treatment plan related to hypertension self management and patient's adherence to plan as established by provider. 09-19-2020: The patient  is doing better at managing her blood pressues. She is coping fairly well with the loss of her husband. She says things are harder now going from 2 incomes to one but she is going to start a part time job soon. She states that she is eating well and sleeping well. Denies any acute issues today. Will continue to monitor.  Provided education to patient re: stroke prevention, s/s of heart attack and stroke, DASH diet, complications of uncontrolled blood pressure. 09-19-2020: Is compliant with heart healthy diet.  Reviewed medications with patient and discussed importance of compliance. 09-19-2020: Is compliant with medications. Denies any issues with medications  Discussed plans with patient for ongoing care management follow up and provided patient with direct contact information for care management team Advised patient, providing education and rationale, to monitor blood pressure daily and record, calling PCP for findings outside established parameters.  Reviewed scheduled/upcoming provider appointments including: 02-03-2021 at 1040 am Self-Care Activities: - Self administers medications as prescribed Attends all scheduled provider appointments Calls provider office for new concerns, questions, or BP outside discussed parameters Checks BP and records as discussed Follows a low sodium diet/DASH diet Patient Goals: - check blood pressure weekly - choose a place to take my blood pressure (home, clinic or office, retail store) - write blood pressure results in a log or  diary - agree on reward when goals are met - agree to work together to make changes - ask questions to understand - have  a family meeting to talk about healthy habits - learn about high blood pressure - blood pressure trends reviewed - depression screen reviewed - home or ambulatory blood pressure monitoring encouraged  Follow Up Plan: Telephone follow up appointment with care management team member scheduled for: 12-05-2020 at 0900 am    Care Plan : RNCM: Depression (Adult)  Updates made by Vanita Ingles, RN since 12/31/2020 12:00 AM  Completed 12/31/2020   Problem: RNCM: Depression Identification (Depression) and Grieving Resolved 12/31/2020  Priority: Medium     Long-Range Goal: RNCM: Depressive Symptoms Identified Completed 12/31/2020  Start Date: 06/03/2020  Expected End Date: 09/14/2021  Recent Progress: On track  Priority: High  Note:   Current Barriers: goal resolved. The patient is doing well with management of her depression. Has good support from her family Chronic Disease Management support and education needs related to depression and grief Lacks caregiver support.  Unable to independently manage depression and grief  Lacks social connections Does not maintain contact with provider office Does not contact provider office for questions/concerns  Nurse Case Manager Clinical Goal(s):  patient will verbalize understanding of plan for effective management of depression and grief  patient will work with [facility] and pcp to address needs related to effective management of depression and grief  patient will attend all scheduled medical appointments: 02-03-2021 at 1040 am  Interventions:  1:1 collaboration with Venita Lick, NP regarding development and update of comprehensive plan of care as evidenced by provider attestation and co-signature Inter-disciplinary care team collaboration (see longitudinal plan of care) Evaluation of current treatment plan related to  depression and grief  and patient's adherence to plan as established by provider. 09-19-2020: The patient states she is doing well. 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 She states going from two incomes to one has been hard. She gets some food stamps and that is helpful. Review of SDOH and the patient denies any acute needs. Review of care guides and helping with community resources, the patient verbalized understanding. Will continue to monitor for changes.  Advised patient to call the office for changes in mood, anxiety, or depression Provided education to patient re: reaching out to [facility] for support for depression and grief  Reviewed scheduled/upcoming provider appointments including:  Discussed plans with patient for ongoing care management follow up and provided patient with direct contact information for care management team  Patient Goals/Self-Care Activities Over the next 120 days, patient will:  - Patient will self administer medications as prescribed Patient will attend all scheduled provider appointments Patient will call pharmacy for medication refills Patient will attend church or other social activities Patient will continue to perform ADL's independently Patient will continue to perform IADL's independently Patient will call provider office for new concerns or questions Patient will work with BSW to address care coordination needs and will continue to work with the clinical team to address health care and disease management related needs.   - anxiety screen reviewed - depression screen reviewed - medication list reviewed  Follow Up Plan: Telephone follow up appointment with care management team member scheduled for: 12-05-2020 at 0900 am        Care Plan : RNCM: HLD management  Updates made by Vanita Ingles, RN since 12/31/2020 12:00 AM  Completed 12/31/2020   Problem: RNCM: HLD Management Resolved 12/31/2020  Priority: Medium      Long-Range Goal: RNCM: HLD Management Completed 12/31/2020  Start Date:  06/03/2020  Expected End Date: 06/03/2021  Recent Progress: On track  Priority: Medium  Note:   Current Barriers:  Poorly controlled hyperlipidemia, complicated by grief, statin use every other day Current antihyperlipidemic regimen: Lipitor 10 mg every other day Most recent lipid panel:     Component Value Date/Time   CHOL 150 03/18/2020 1352   TRIG 91 03/18/2020 1352   HDL 48 03/18/2020 1352   [place] 85 03/18/2020 1352   ASCVD risk enhancing conditions: age >30,  HTN Lacks social connections Does not maintain contact with provider office Does not contact provider office for [place]):  patient will work with Consulting civil engineer, providers, and care team towards execution of optimized self-health management plan patient will verbalize understanding of plan for effective management of HLD patient will work with [facility] and pcp to address needs related to HLD patient will attend all scheduled medical appointments: 02-03-2021 at 1040am Interventions: Collaboration with Venita Lick, NP regarding development and update of comprehensive plan of care as evidenced by provider attestation and co-signature Inter-disciplinary care team collaboration (see longitudinal plan of care) Medication review performed; medication list updated in electronic medical record.  Inter-disciplinary care team collaboration (see longitudinal plan of care) Referred to pharmacy team for assistance with HLD medication management Evaluation of current treatment plan related to HLD and patient's adherence to plan as established by provider. 09-19-2020: The patient saw the pcp in July and got a good report. They were unable to get her blood work because her veins were rolling. The patient states that they will get it when she goes back in December. She denies any acute issues related to her health. Is  managing well at this time. Will continue to monitor.  Advised patient to call the office for questions or concerns Provided education to patient re: heart healthy diet and taking medications as prescribed  Reviewed medications with patient and discussed compliance, the patient is takes statins every other day. 09-19-2020: The patient is compliant with medications.  Provided patient with HLD educational materials related to effective management of HLD Reviewed scheduled/upcoming provider appointments including: 02-03-2021 at 1040am Discussed plans with patient for ongoing care management follow up and provided patient with direct contact information for care management team Patient Goals/Self-Care Activities: - call for medicine refill 2 or 3 days before it runs out - call if I am sick and can't take my medicine - keep a list of all the medicines I take; vitamins and herbals too - learn to read medicine labels - use a pillbox to sort medicine - use an alarm clock or phone to remind me to take my medicine - change to whole grain breads, cereal, pasta - drink 6 to 8 glasses of water each day - eat 3 to 5 servings of fruits and vegetables each day - eat 5 or 6 small meals each day - fill half the plate with nonstarchy vegetables - limit fast food meals to no more than 1 per week - manage portion size - prepare main meal at home 3 to 5 days each week - read food labels for fat, fiber, carbohydrates and portion size - switch to low-fat or skim milk - switch to sugar-free drinks - be open to making changes - I can manage, know and watch for signs of a heart attack - if I have chest pain, call for help - learn about small changes that will make a big difference - learn my personal risk factors -  barriers to meeting goals identified - change-talk evoked - choices provided - collaboration with team encouraged - decision-making supported - difficulty of making life-long changes acknowledged -  health risks reviewed - problem-solving facilitated - questions answered - readiness for change evaluated - reassurance provided - self-reflection promoted - self-reliance encouraged  Follow Up Plan: Telephone follow up appointment with care management team member scheduled for: 12-05-2020 at 0900 am      Care Plan : RNCM: General Plan of Care (Adult) for Chronic Disease Management and Care Coordination Needs  Updates made by Vanita Ingles, RN since 12/31/2020 12:00 AM     Problem: RNCM: Development of Plan of Care for Chronic Disease (HTN, HLD, OA)   Priority: High     Long-Range Goal: RNCM: Effective Management  of Plan of Care for Chronic Disease (HTN, HLD, OA)   Start Date: 12/31/2020  Expected End Date: 12/31/2021  Note:   Current Barriers:  Knowledge Deficits related to plan of care for management of HTN, HLD, and chronic pain   Chronic Disease Management support and education needs related to HTN, HLD, and chronic pain   RNCM Clinical Goal(s):  Patient will verbalize understanding of plan for management of HTN, HLD, and Osteoarthritis as evidenced by following the plan of care and working with the CCM team and pcp to optimize health and well being  take all medications exactly as prescribed and will call provider for medication related questions as evidenced by taking medications as directed and calling for refills before running out of medications     attend all scheduled medical appointments: 02-03-2021 at 50 am with pcp  as evidenced by keeping appointments and calling for schedule changes        demonstrate improved and ongoing health management independence as evidenced by taking medications as directed, following a heart healthy diet, and preventive care and maintaining of health and well being        demonstrate a decrease in HTN, HLD, and Osteoarthritis exacerbations  as evidenced by working with the CCM team to optimize health and well being and effectively manage  chronic condtions  demonstrate ongoing self health care management ability for effective management of chronic conditions  as evidenced by working with the CCM team through collaboration with Consulting civil engineer, provider, and care team.   Interventions: 1:1 collaboration with primary care provider regarding development and update of comprehensive plan of care as evidenced by provider attestation and co-signature Inter-disciplinary care team collaboration (see longitudinal plan of care) Evaluation of current treatment plan related to  self management and patient's adherence to plan as established by provider   Hyperlipidemia:  (Status: Goal on Track (progressing): YES.) Long Term Goal  Lab Results  Component Value Date   CHOL 150 03/18/2020   HDL 48 03/18/2020   [place] 85 03/18/2020   TRIG 91 03/18/2020     Medication review performed; medication list updated in electronic medical record.  Provider established cholesterol goals reviewed; Counseled on importance of regular laboratory monitoring as prescribed; Provided HLD educational materials; Reviewed role and benefits of statin for ASCVD risk reduction; Discussed strategies to manage statin-induced myalgias; Reviewed importance of limiting foods high in cholesterol; Screening for signs and symptoms of depression related to chronic disease state;  Assessed social determinant of health barriers;   Hypertension: (Status: Goal on Track (progressing): YES.) Last practice recorded BP readings:  BP Readings from Last 3 Encounters:  11/10/20 132/80  09/03/20 122/72  05/22/20 128/78  Most recent eGFR/CrCl:  No results found for: EGFR  No components found for: CRCL  Evaluation of current treatment plan related to hypertension self management and patient's adherence to plan as established by provider;   Provided education to patient re: stroke prevention, s/s of heart attack and stroke; Reviewed prescribed diet heart healthy  Reviewed  medications with patient and discussed importance of compliance. 12-31-2020: Denies any issues with medications compliance, states she takes her medications as ordered;  Discussed plans with patient for ongoing care management follow up and provided patient with direct contact information for care management team; Advised patient, providing education and rationale, to monitor blood pressure daily and record, calling PCP for findings outside established parameters. 12-31-2020: The patient does not like to take her blood pressures at home, she states that it makes her nervous and upset especially if it is up. The patient denies headaches, states she is sleeping well. Her daughter and granddaughter live with her and she is thankful for this. She is working a part time job at [employer] and is happy about the income to help pay her bills. She denies any acute findings today.   Advised patient to discuss blood pressure trends with provider; Provided education on prescribed diet heart healthy;  Discussed complications of poorly controlled blood pressure such as heart disease, stroke, circulatory complications, vision complications, kidney impairment, sexual dysfunction;   Pain:  (Status: Goal on Track (progressing): YES.) Long Term Goal  Pain assessment performed. 12-31-2020: The patient states that she has some arthritis pain but she rest when she needs to. States she is only working part time and will continue to do so for now. She denies any pain today but knows her limits. She is excited about next week cooking and being with her family.  Medications reviewed Reviewed provider established plan for pain management; Discussed importance of adherence to all scheduled medical appointments. 12-31-2020: Review of upcoming appointment with the pcp on 02-03-2021 at 1040 am; Counseled on the importance of reporting any/all new or changed pain symptoms or management strategies to pain management provider; Advised  patient to report to care team affect of pain on daily activities; Discussed use of relaxation techniques and/or diversional activities to assist with pain reduction (distraction, imagery, relaxation, massage, acupressure, TENS, heat, and cold application; Reviewed with patient prescribed pharmacological and nonpharmacological pain relief strategies; Advised patient to discuss unresolved pain, changes in level or intensity of pain with provider;  Patient Goals/Self-Care Activities: Patient will self administer medications as prescribed as evidenced by self report/primary caregiver report  Patient will attend all scheduled provider appointments as evidenced by clinician review of documented attendance to scheduled appointments and patient/caregiver report Patient will call pharmacy for medication refills as evidenced by patient report and review of pharmacy fill history as appropriate Patient will attend church or other social activities as evidenced by patient report Patient will continue to perform ADL's independently as evidenced by patient/caregiver report Patient will continue to perform IADL's independently as evidenced by patient/caregiver report Patient will call provider office for new concerns or questions as evidenced by review of documented incoming telephone call notes and patient report Patient will work with BSW to address care coordination needs and will continue to work with the clinical team to address health care and disease management related needs as evidenced by documented adherence to scheduled care management/care coordination appointments - check blood pressure weekly - choose a place to take my blood pressure (home, clinic or office, retail store) - write blood pressure results  in a log or diary - learn about high blood pressure - keep a blood pressure log - take blood pressure log to all doctor appointments - call doctor for signs and symptoms of high blood pressure -  develop an action plan for high blood pressure - keep all doctor appointments - take medications for blood pressure exactly as prescribed - begin an exercise program - report new symptoms to your doctor - eat more whole grains, fruits and vegetables, lean meats and healthy fats - call for medicine refill 2 or 3 days before it runs out - take all medications exactly as prescribed - call doctor with any symptoms you believe are related to your medicine - call doctor when you experience any new symptoms - go to all doctor appointments as scheduled - adhere to prescribed diet: heart healthy diet        The patient verbalized understanding of instructions, educational materials, and care plan provided today and declined offer to receive copy of patient instructions, educational materials, and care plan.   Telephone follow up appointment with care management team member scheduled for: 03-03-2021 at 0900 am  [name] RN, MSN, [facility] Mobile: [phone]

## 2020-12-31 NOTE — Chronic Care Management (AMB) (Signed)
Chronic Care Management   CCM RN Visit Note  12/31/2020 Name: Carol [name] MRN: [id number] DOB: 04-Nov-1950  Subjective: Carol [name] is a 70 y.o. year old female who is a primary care patient of Cannady, Barbaraann Faster, NP. The care management team was consulted for assistance with disease management and care coordination needs.    Engaged with patient by telephone for follow up visit in response to provider referral for case management and/or care coordination services.   Consent to Services:  The patient was given information about Chronic Care Management services, agreed to services, and gave verbal consent prior to initiation of services.  Please see initial visit note for detailed documentation.   Patient agreed to services and verbal consent obtained.   Assessment: Review of patient past medical history, allergies, medications, health status, including review of consultants reports, laboratory and other test data, was performed as part of comprehensive evaluation and provision of chronic care management services.   SDOH (Social Determinants of Health) assessments and interventions performed:  SDOH Interventions    Flowsheet Row Most Recent Value  SDOH Interventions   Food Insecurity Interventions Intervention Not Indicated  Financial Strain Interventions Intervention Not Indicated  Housing Interventions Intervention Not Indicated  Intimate Partner Violence Interventions Intervention Not Indicated  Physical Activity Interventions Other (Comments)  [works a part time job, no structured activity]  Stress Interventions Intervention Not Indicated  Social Connections Interventions Intervention Not Indicated  Transportation Interventions Intervention Not Indicated        CCM Care Plan  No Known Allergies  Outpatient Encounter Medications as of 12/31/2020  Medication Sig   aspirin 81 MG tablet Take 81 mg by mouth daily.   atorvastatin (LIPITOR) 10 MG tablet TAKE 1 TABLET BY  MOUTH EVERY OTHER DAY   ciclopirox (PENLAC) 8 % solution Apply topically at bedtime. Apply over nail and surrounding skin. Apply daily over previous coat. After seven (7) days, may remove with alcohol and continue cycle.   fluticasone (FLONASE) 50 MCG/ACT nasal spray SPRAY 2 SPRAYS INTO EACH NOSTRIL ONCE A DAY   guaiFENesin (ROBITUSSIN) 100 MG/5ML SOLN Take 5 mLs (100 mg total) by mouth every 4 (four) hours as needed for cough or to loosen phlegm.   loratadine (CLARITIN) 10 MG tablet Take 10 mg by mouth daily.   losartan (COZAAR) 50 MG tablet TAKE 1/2 TABLET (25 MG TOTAL) BY MOUTH DAILY   meclizine (ANTIVERT) 25 MG tablet Take 1 tablet (25 mg total) by mouth 3 (three) times daily as needed for dizziness.   meloxicam (MOBIC) 15 MG tablet TAKE 1 TABLET BY MOUTH DAILY   triamcinolone (KENALOG) 0.1 % Apply 1 application topically 2 (two) times daily.   No facility-administered encounter medications on file as of 12/31/2020.    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    Conditions to be addressed/monitored:HTN, HLD, and Osteoarthritis  Care Plan : RNCM: Hypertension (Adult)  Updates made by Vanita Ingles, RN since 12/31/2020 12:00 AM  Completed 12/31/2020   Problem: RNCM: Hypertension (Hypertension) Resolved 12/31/2020  Priority: Medium     Long-Range Goal: RNCM: Hypertension Monitored Completed 12/31/2020  Start Date: 06/03/2020  Expected End  Date: 06/03/2021  Recent Progress: On track  Priority: Medium  Note:   Objective: Resolving, duplicate goal  Last practice recorded BP readings:  BP Readings from Last 3 Encounters:  09/03/20 122/72  05/22/20 128/78  03/18/20 130/68    Most recent eGFR/CrCl: No results found for: EGFR  No components found for: CRCL Current Barriers:  Knowledge Deficits related to basic understanding of hypertension pathophysiology and self care management Knowledge Deficits related to understanding of medications prescribed for management of hypertension Limited Social Support Lacks social connections Does not maintain contact with provider office Does not contact provider office for questions/concerns Case Manager Clinical Goal(s):  patient will verbalize understanding of plan for hypertension management patient will attend all scheduled medical appointments: 02-03-2021 at 1040 am patient will demonstrate improved adherence to prescribed treatment plan for hypertension as evidenced by taking all medications as prescribed, monitoring and recording blood pressure as directed, adhering to low sodium/DASH diet patient will demonstrate improved health management independence as evidenced by checking blood pressure as directed and notifying PCP if SBP>160 or DBP > 90, taking all medications as prescribe, and adhering to a low sodium diet as discussed. patient will verbalize basic understanding of hypertension disease process and self health management plan as evidenced by taking medications as prescribed, adhering to heart healthy diet, and working with CCM team for management of Chronic health conditions Interventions:  Collaboration with Venita Lick, NP regarding development and update of comprehensive plan of care as evidenced by provider attestation and co-signature Inter-disciplinary care team collaboration (see longitudinal plan of care) Evaluation of current treatment plan related to hypertension  self management and patient's adherence to plan as established by provider. 09-19-2020: The patient  is doing better at managing her blood pressues. She is coping fairly well with the loss of her husband. She says things are harder now going from 2 incomes to one but she is going to start a part time job soon. She states that she is eating well and sleeping well. Denies any acute issues today. Will continue to monitor.  Provided education to patient re: stroke prevention, s/s of heart attack and stroke, DASH diet, complications of uncontrolled blood pressure. 09-19-2020: Is compliant with heart healthy diet.  Reviewed medications with patient and discussed importance of compliance. 09-19-2020: Is compliant with medications. Denies any issues with medications  Discussed plans with patient for ongoing care management follow up and provided patient with direct contact information for care management team Advised patient, providing education and rationale, to monitor blood pressure daily and record, calling PCP for findings outside established parameters.  Reviewed scheduled/upcoming provider appointments including: 02-03-2021 at 1040 am Self-Care Activities: - Self administers medications as prescribed Attends all scheduled provider appointments Calls provider office for new concerns, questions, or BP outside discussed parameters Checks BP and records as discussed Follows a low sodium diet/DASH diet Patient Goals: - check blood pressure weekly - choose a place to take my blood pressure (home, clinic or office, retail store) - write blood pressure results in a log or diary - agree on reward when goals are met - agree to work together to make changes - ask questions to understand - have a family meeting to talk about healthy habits - learn about high blood pressure - blood pressure trends reviewed - depression screen  reviewed - home or ambulatory blood pressure monitoring encouraged  Follow Up Plan:  Telephone follow up appointment with care management team member scheduled for: 12-05-2020 at 0900 am    Care Plan : RNCM: Depression (Adult)  Updates made by Vanita Ingles, RN since 12/31/2020 12:00 AM  Completed 12/31/2020   Problem: RNCM: Depression Identification (Depression) and Grieving Resolved 12/31/2020  Priority: Medium     Long-Range Goal: RNCM: Depressive Symptoms Identified Completed 12/31/2020  Start Date: 06/03/2020  Expected End Date: 09/14/2021  Recent Progress: On track  Priority: High  Note:   Current Barriers: goal resolved. The patient is doing well with management of her depression. Has good support from her family Chronic Disease Management support and education needs related to depression and grief Lacks caregiver support.  Unable to independently manage depression and grief  Lacks social connections Does not maintain contact with provider office Does not contact provider office for questions/concerns  Nurse Case Manager Clinical Goal(s):  patient will verbalize understanding of plan for effective management of depression and grief  patient will work with [facility] and pcp to address needs related to effective management of depression and grief  patient will attend all scheduled medical appointments: 02-03-2021 at 1040 am  Interventions:  1:1 collaboration with Venita Lick, NP regarding development and update of comprehensive plan of care as evidenced by provider attestation and co-signature Inter-disciplinary care team collaboration (see longitudinal plan of care) Evaluation of current treatment plan related to depression and grief  and patient's adherence to plan as established by provider. 09-19-2020: The patient states she is doing well. 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 She states going from two incomes to one has been hard. She gets some food stamps and that is helpful. Review of SDOH and the patient denies  any acute needs. Review of care guides and helping with community resources, the patient verbalized understanding. Will continue to monitor for changes.  Advised patient to call the office for changes in mood, anxiety, or depression Provided education to patient re: reaching out to [facility] for support for depression and grief  Reviewed scheduled/upcoming provider appointments including:  Discussed plans with patient for ongoing care management follow up and provided patient with direct contact information for care management team  Patient Goals/Self-Care Activities Over the next 120 days, patient will:  - Patient will self administer medications as prescribed Patient will attend all scheduled provider appointments Patient will call pharmacy for medication refills Patient will attend church or other social activities Patient will continue to perform ADL's independently Patient will continue to perform IADL's independently Patient will call provider office for new concerns or questions Patient will work with BSW to address care coordination needs and will continue to work with the clinical team to address health care and disease management related needs.   - anxiety screen reviewed - depression screen reviewed - medication list reviewed  Follow Up Plan: Telephone follow up appointment with care management team member scheduled for: 12-05-2020 at 0900 am        Care Plan : RNCM: HLD management  Updates made by Vanita Ingles, RN since 12/31/2020 12:00 AM  Completed 12/31/2020   Problem: RNCM: HLD Management Resolved 12/31/2020  Priority: Medium     Long-Range Goal: RNCM: HLD Management Completed 12/31/2020  Start Date: 06/03/2020  Expected End Date: 06/03/2021  Recent Progress: On track  Priority: Medium  Note:   Current Barriers:  Poorly  controlled hyperlipidemia, complicated by grief, statin use every other day Current antihyperlipidemic regimen: Lipitor 10 mg every other  day Most recent lipid panel:     Component Value Date/Time   CHOL 150 03/18/2020 1352   TRIG 91 03/18/2020 1352   HDL 48 03/18/2020 1352   [place] 85 03/18/2020 1352   ASCVD risk enhancing conditions: age >89,  HTN Lacks social connections Does not maintain contact with provider office Does not contact provider office for questions/concerns RN Care Manager Clinical Goal(s):  patient will work with Consulting civil engineer, providers, and care team towards execution of optimized self-health management plan patient will verbalize understanding of plan for effective management of HLD patient will work with [facility] and pcp to address needs related to HLD patient will attend all scheduled medical appointments: 02-03-2021 at 1040am Interventions: Collaboration with Venita Lick, NP regarding development and update of comprehensive plan of care as evidenced by provider attestation and co-signature Inter-disciplinary care team collaboration (see longitudinal plan of care) Medication review performed; medication list updated in electronic medical record.  Inter-disciplinary care team collaboration (see longitudinal plan of care) Referred to pharmacy team for assistance with HLD medication management Evaluation of current treatment plan related to HLD and patient's adherence to plan as established by provider. 09-19-2020: The patient saw the pcp in July and got a good report. They were unable to get her blood work because her veins were rolling. The patient states that they will get it when she goes back in December. She denies any acute issues related to her health. Is managing well at this time. Will continue to monitor.  Advised patient to call the office for questions or concerns Provided education to patient re: heart healthy diet and taking medications as prescribed  Reviewed medications with patient and discussed compliance, the patient is takes statins every other day. 09-19-2020: The patient is  compliant with medications.  Provided patient with HLD educational materials related to effective management of HLD Reviewed scheduled/upcoming provider appointments including: 02-03-2021 at 1040am Discussed plans with patient for ongoing care management follow up and provided patient with direct contact information for care management team Patient Goals/Self-Care Activities: - call for medicine refill 2 or 3 days before it runs out - call if I am sick and can't take my medicine - keep a list of all the medicines I take; vitamins and herbals too - learn to read medicine labels - use a pillbox to sort medicine - use an alarm clock or phone to remind me to take my medicine - change to whole grain breads, cereal, pasta - drink 6 to 8 glasses of water each day - eat 3 to 5 servings of fruits and vegetables each day - eat 5 or 6 small meals each day - fill half the plate with nonstarchy vegetables - limit fast food meals to no more than 1 per week - manage portion size - prepare main meal at home 3 to 5 days each week - read food labels for fat, fiber, carbohydrates and portion size - switch to low-fat or skim milk - switch to sugar-free drinks - be open to making changes - I can manage, know and watch for signs of a heart attack - if I have chest pain, call for help - learn about small changes that will make a big difference - learn my personal risk factors - barriers to meeting goals identified - change-talk evoked - choices provided - collaboration with team encouraged - decision-making supported -  difficulty of making life-long changes acknowledged - health risks reviewed - problem-solving facilitated - questions answered - readiness for change evaluated - reassurance provided - self-reflection promoted - self-reliance encouraged  Follow Up Plan: Telephone follow up appointment with care management team member scheduled for: 12-05-2020 at 0900 am      Care Plan : RNCM:  General Plan of Care (Adult) for Chronic Disease Management and Care Coordination Needs  Updates made by Vanita Ingles, RN since 12/31/2020 12:00 AM     Problem: RNCM: Development of Plan of Care for Chronic Disease (HTN, HLD, OA)   Priority: High     Long-Range Goal: RNCM: Effective Management  of Plan of Care for Chronic Disease (HTN, HLD, OA)   Start Date: 12/31/2020  Expected End Date: 12/31/2021  Note:   Current Barriers:  Knowledge Deficits related to plan of care for management of HTN, HLD, and chronic pain   Chronic Disease Management support and education needs related to HTN, HLD, and chronic pain   RNCM Clinical Goal(s):  Patient will verbalize understanding of plan for management of HTN, HLD, and Osteoarthritis as evidenced by following the plan of care and working with the CCM team and pcp to optimize health and well being  take all medications exactly as prescribed and will call provider for medication related questions as evidenced by taking medications as directed and calling for refills before running out of medications     attend all scheduled medical appointments: 02-03-2021 at 1040 am with pcp  as evidenced by keeping appointments and calling for schedule changes        demonstrate improved and ongoing health management independence as evidenced by taking medications as directed, following a heart healthy diet, and preventive care and maintaining of health and well being        demonstrate a decrease in HTN, HLD, and Osteoarthritis exacerbations  as evidenced by working with the CCM team to optimize health and well being and effectively manage chronic condtions  demonstrate ongoing self health care management ability for effective management of chronic conditions  as evidenced by working with the CCM team through collaboration with Consulting civil engineer, provider, and care team.   Interventions: 1:1 collaboration with primary care provider regarding development and update of  comprehensive plan of care as evidenced by provider attestation and co-signature Inter-disciplinary care team collaboration (see longitudinal plan of care) Evaluation of current treatment plan related to  self management and patient's adherence to plan as established by provider   Hyperlipidemia:  (Status: Goal on Track (progressing): YES.) Long Term Goal  Lab Results  Component Value Date   CHOL 150 03/18/2020   HDL 48 03/18/2020   [place] 85 03/18/2020   TRIG 91 03/18/2020     Medication review performed; medication list updated in electronic medical record.  Provider established cholesterol goals reviewed; Counseled on importance of regular laboratory monitoring as prescribed; Provided HLD educational materials; Reviewed role and benefits of statin for ASCVD risk reduction; Discussed strategies to manage statin-induced myalgias; Reviewed importance of limiting foods high in cholesterol; Screening for signs and symptoms of depression related to chronic disease state;  Assessed social determinant of health barriers;   Hypertension: (Status: Goal on Track (progressing): YES.) Last practice recorded BP readings:  BP Readings from Last 3 Encounters:  11/10/20 132/80  09/03/20 122/72  05/22/20 128/78  Most recent eGFR/CrCl: No results found for: EGFR  No components found for: CRCL  Evaluation of current treatment plan related to hypertension  self management and patient's adherence to plan as established by provider;   Provided education to patient re: stroke prevention, s/s of heart attack and stroke; Reviewed prescribed diet heart healthy  Reviewed medications with patient and discussed importance of compliance. 12-31-2020: Denies any issues with medications compliance, states she takes her medications as ordered;  Discussed plans with patient for ongoing care management follow up and provided patient with direct contact information for care management team; Advised patient,  providing education and rationale, to monitor blood pressure daily and record, calling PCP for findings outside established parameters. 12-31-2020: The patient does not like to take her blood pressures at home, she states that it makes her nervous and upset especially if it is up. The patient denies headaches, states she is sleeping well. Her daughter and granddaughter live with her and she is thankful for this. She is working a part time job at [employer] and is happy about the income to help pay her bills. She denies any acute findings today.   Advised patient to discuss blood pressure trends with provider; Provided education on prescribed diet heart healthy;  Discussed complications of poorly controlled blood pressure such as heart disease, stroke, circulatory complications, vision complications, kidney impairment, sexual dysfunction;   Pain:  (Status: Goal on Track (progressing): YES.) Long Term Goal  Pain assessment performed. 12-31-2020: The patient states that she has some arthritis pain but she rest when she needs to. States she is only working part time and will continue to do so for now. She denies any pain today but knows her limits. She is excited about next week cooking and being with her family.  Medications reviewed Reviewed provider established plan for pain management; Discussed importance of adherence to all scheduled medical appointments. 12-31-2020: Review of upcoming appointment with the pcp on 02-03-2021 at 1040 am; Counseled on the importance of reporting any/all new or changed pain symptoms or management strategies to pain management provider; Advised patient to report to care team affect of pain on daily activities; Discussed use of relaxation techniques and/or diversional activities to assist with pain reduction (distraction, imagery, relaxation, massage, acupressure, TENS, heat, and cold application; Reviewed with patient prescribed pharmacological and nonpharmacological pain  relief strategies; Advised patient to discuss unresolved pain, changes in level or intensity of pain with provider;  Patient Goals/Self-Care Activities: Patient will self administer medications as prescribed as evidenced by self report/primary caregiver report  Patient will attend all scheduled provider appointments as evidenced by clinician review of documented attendance to scheduled appointments and patient/caregiver report Patient will call pharmacy for medication refills as evidenced by patient report and review of pharmacy fill history as appropriate Patient will attend church or other social activities as evidenced by patient report Patient will continue to perform ADL's independently as evidenced by patient/caregiver report Patient will continue to perform IADL's independently as evidenced by patient/caregiver report Patient will call provider office for new concerns or questions as evidenced by review of documented incoming telephone call notes and patient report Patient will work with BSW to address care coordination needs and will continue to work with the clinical team to address health care and disease management related needs as evidenced by documented adherence to scheduled care management/care coordination appointments - check blood pressure weekly - choose a place to take my blood pressure (home, clinic or office, retail store) - write blood pressure results in a log or diary - learn about high blood pressure - keep a blood pressure log - take blood  pressure log to all doctor appointments - call doctor for signs and symptoms of high blood pressure - develop an action plan for high blood pressure - keep all doctor appointments - take medications for blood pressure exactly as prescribed - begin an exercise program - report new symptoms to your doctor - eat more whole grains, fruits and vegetables, lean meats and healthy fats - call for medicine refill 2 or 3 days before it  runs out - take all medications exactly as prescribed - call doctor with any symptoms you believe are related to your medicine - call doctor when you experience any new symptoms - go to all doctor appointments as scheduled - adhere to prescribed diet: heart healthy diet        Plan:Telephone follow up appointment with care management team member scheduled for:  03-03-2021 at 0900 am  [place], MSN, [facility] Mobile: [phone]

## 2021-01-12 ENCOUNTER — Other Ambulatory Visit: Payer: Self-pay | Admitting: Nurse Practitioner

## 2021-01-12 DIAGNOSIS — Z1231 Encounter for screening mammogram for malignant neoplasm of breast: Secondary | ICD-10-CM

## 2021-01-14 DIAGNOSIS — M1990 Unspecified osteoarthritis, unspecified site: Secondary | ICD-10-CM

## 2021-01-14 DIAGNOSIS — I10 Essential (primary) hypertension: Secondary | ICD-10-CM | POA: Diagnosis not present

## 2021-01-14 DIAGNOSIS — E782 Mixed hyperlipidemia: Secondary | ICD-10-CM | POA: Diagnosis not present

## 2021-01-26 ENCOUNTER — Other Ambulatory Visit: Payer: Self-pay | Admitting: Nurse Practitioner

## 2021-01-26 NOTE — Telephone Encounter (Signed)
Pt called to check status of refill request for Flonase /please advise

## 2021-01-26 NOTE — Telephone Encounter (Signed)
Requested Prescriptions  Pending Prescriptions Disp Refills  . fluticasone (FLONASE) 50 MCG/ACT nasal spray [Pharmacy Med Name: FLUTICASONE PROPIONATE 50 MCG/ACT N] 16 g 1    Sig: SPRAY 2 SPRAYS INTO EACH NOSTRIL ONCE A DAY     Ear, Nose, and Throat: Nasal Preparations - Corticosteroids Passed - 01/26/2021  9:49 AM      Passed - Valid encounter within last 12 months    Recent Outpatient Visits          2 months ago Viral upper respiratory tract infection   [facility] [place], Dorie Rank, NP   4 months ago Primary hypertension   [facility] [place], Kirvin T, NP   7 months ago Viral upper respiratory tract infection   [facility] Larae Grooms, NP   8 months ago Primary hypertension   [facility] Cann[place], Corrie Dandy T, NP   10 months ago Primary hypertension   [facility] [place], Dorie Rank, NP      Future Appointments            In 1 week [place], Dorie Rank, NP [facility]   In 7 months  [facility]

## 2021-02-03 ENCOUNTER — Ambulatory Visit (INDEPENDENT_AMBULATORY_CARE_PROVIDER_SITE_OTHER): Payer: Medicare Other | Admitting: Nurse Practitioner

## 2021-02-03 ENCOUNTER — Other Ambulatory Visit: Payer: Self-pay

## 2021-02-03 ENCOUNTER — Encounter: Payer: Self-pay | Admitting: Nurse Practitioner

## 2021-02-03 VITALS — BP 128/78 | HR 78 | Temp 98.0°F | Resp 16 | Ht 63.0 in | Wt 153.6 lb

## 2021-02-03 DIAGNOSIS — R748 Abnormal levels of other serum enzymes: Secondary | ICD-10-CM

## 2021-02-03 DIAGNOSIS — R7989 Other specified abnormal findings of blood chemistry: Secondary | ICD-10-CM

## 2021-02-03 DIAGNOSIS — R7309 Other abnormal glucose: Secondary | ICD-10-CM | POA: Diagnosis not present

## 2021-02-03 DIAGNOSIS — B351 Tinea unguium: Secondary | ICD-10-CM

## 2021-02-03 DIAGNOSIS — Z23 Encounter for immunization: Secondary | ICD-10-CM

## 2021-02-03 DIAGNOSIS — I10 Essential (primary) hypertension: Secondary | ICD-10-CM | POA: Diagnosis not present

## 2021-02-03 DIAGNOSIS — E782 Mixed hyperlipidemia: Secondary | ICD-10-CM | POA: Diagnosis not present

## 2021-02-03 DIAGNOSIS — R801 Persistent proteinuria, unspecified: Secondary | ICD-10-CM | POA: Diagnosis not present

## 2021-02-03 LAB — MICROALBUMIN, URINE WAIVED
Creatinine, Urine Waived: 50 mg/dL (ref 10–300)
Microalb, Ur Waived: 30 mg/L — ABNORMAL HIGH (ref 0–19)

## 2021-02-03 MED ORDER — LOSARTAN POTASSIUM 50 MG PO TABS: ORAL_TABLET | ORAL | 4 refills | Status: DC

## 2021-02-03 MED ORDER — ATORVASTATIN CALCIUM 10 MG PO TABS: ORAL_TABLET | ORAL | 4 refills | Status: DC

## 2021-02-03 MED ORDER — MELOXICAM 15 MG PO TABS: 15.0000 mg | ORAL_TABLET | Freq: Every day | ORAL | 4 refills | Status: DC

## 2021-02-03 NOTE — Assessment & Plan Note (Signed)
Continue Losartan for kidney protection and recheck urine and CMP today.

## 2021-02-03 NOTE — Assessment & Plan Note (Signed)
Recheck A1C today, as trending up recent visit, and change plan of care as needed.  Consider medication if needed in future.

## 2021-02-03 NOTE — Progress Notes (Signed)
BP 128/78 (BP Location: Left Arm, Patient Position: Sitting, Cuff Size: Normal)    Pulse 78    Temp 98 F (36.7 C)    Resp 16    Ht 5\' 3"  (1.6 m)    Wt 153 lb 9.6 oz (69.7 kg)    LMP  (LMP Unknown)    SpO2 97%    BMI 27.21 kg/m    Subjective:    Patient ID: , female    DOB: June 13, 1950, 70 y.o.   MRN: [id number]  HPI: Carol [name] is a 70 y.o. female  Chief Complaint  Patient presents with   Hyperlipidemia   Hypertension   Nail Problem    Patient states half of her toe nail has came off since using the medication prescribed by provider for Fungus. Patient states she would like the provider to take a look at it today's visit.    Medication Refill    Patient is requesting refills on her medications at today's visit.    HYPERTENSION / HYPERLIPIDEMIA Continues on Losartan 25 MG daily for HTN and proteinuria and Lipitor every other day.   History of elevation in A1C, no current symptoms and focused on diet -- recent February 2022 A1c 5.4.  Satisfied with current treatment? yes Duration of hypertension: chronic BP monitoring frequency: not checking BP range: BP medication side effects: no Duration of hyperlipidemia: chronic Cholesterol medication side effects: no Cholesterol supplements: none Medication compliance: good compliance Aspirin: yes Recent stressors: no Recurrent headaches: no Visual changes: no Palpitations: no Dyspnea: no Chest pain: no Lower extremity edema: no Dizzy/lightheaded: no  THYROID ISSUES Has history of elevation in TSH, but recent levels have remained normal -- TSH in February 3.330.  Thyroid ultrasound on 10/30/19 -- multinodular goiter noted -- ordered by ENT -- thyroid biopsy which was normal.   Fatigue: no Cold intolerance: no Heat intolerance: no Weight gain: no Weight loss: no Constipation: no Diarrhea/loose stools: no Palpitations: no Lower extremity edema: no Anxiety/depressed mood: no   TOENAIL FUNGUS Ongoing issues  with fungal disease to left great toenail.  Has used in Penlac in past, which offered benefit.  She reports now that 1/2 of her great toenail is coming off.    Relevant past medical, surgical, family and social history reviewed and updated as indicated. Interim medical history since our last visit reviewed. Allergies and medications reviewed and updated.  Review of Systems  Constitutional:  Negative for activity change, appetite change, diaphoresis, fatigue and fever.  Respiratory:  Negative for cough, chest tightness and shortness of breath.   Cardiovascular:  Negative for chest pain, palpitations and leg swelling.  Gastrointestinal: Negative.   Neurological: Negative.   Psychiatric/Behavioral: Negative.     Per HPI unless specifically indicated above     Objective:    BP 128/78 (BP Location: Left Arm, Patient Position: Sitting, Cuff Size: Normal)    Pulse 78    Temp 98 F (36.7 C)    Resp 16    Ht 5\' 3"  (1.6 m)    Wt 153 lb 9.6 oz (69.7 kg)    LMP  (LMP Unknown)    SpO2 97%    BMI 27.21 kg/m   Wt Readings from Last 3 Encounters:  02/03/21 153 lb 9.6 oz (69.7 kg)  11/10/20 159 lb 3.2 oz (72.2 kg)  09/03/20 160 lb 6.4 oz (72.8 kg)    Physical Exam Vitals and nursing note reviewed.  Constitutional:      General: She is  awake. She is not in acute distress.    Appearance: She is well-developed and overweight. She is not ill-appearing.  HENT:     Head: Normocephalic.     Right Ear: Hearing, tympanic membrane, ear canal and external ear normal.     Left Ear: Hearing, tympanic membrane, ear canal and external ear normal.  Eyes:     General: Lids are normal.        Right eye: No discharge.        Left eye: No discharge.     Conjunctiva/sclera: Conjunctivae normal.     Pupils: Pupils are equal, round, and reactive to light.  Neck:     Vascular: No carotid bruit.  Cardiovascular:     Rate and Rhythm: Normal rate and regular rhythm.     Pulses:          Dorsalis pedis pulses are  2+ on the right side and 2+ on the left side.       Posterior tibial pulses are 2+ on the right side.     Heart sounds: Normal heart sounds. No murmur heard.   No gallop.  Pulmonary:     Effort: Pulmonary effort is normal. No accessory muscle usage or respiratory distress.     Breath sounds: Normal breath sounds.  Abdominal:     General: Bowel sounds are normal.     Palpations: Abdomen is soft.  Musculoskeletal:     Cervical back: Normal range of motion and neck supple.     Right lower leg: No edema.     Left lower leg: No edema.     Right foot: Normal range of motion.     Left foot: Normal range of motion.  Feet:     Right foot:     Protective Sensation: 10 sites tested.  10 sites sensed.     Skin integrity: Skin integrity normal.     Toenail Condition: Right toenails are normal.     Left foot:     Protective Sensation: 10 sites tested.  10 sites sensed.     Skin integrity: Skin integrity normal.     Toenail Condition: Fungal disease present.    Comments: Fungal infection left great toe with toenail hanging off, removed with scissors -- tolerated well. Lymphadenopathy:     Head:     Right side of head: No submental, submandibular, tonsillar, preauricular or posterior auricular adenopathy.     Left side of head: No submental, submandibular, tonsillar, preauricular or posterior auricular adenopathy.  Skin:    General: Skin is warm and dry.  Neurological:     Mental Status: She is alert and oriented to person, place, and time.  Psychiatric:        Attention and Perception: Attention normal.        Mood and Affect: Mood normal.        Speech: Speech normal.        Behavior: Behavior normal. Behavior is cooperative.        Thought Content: Thought content normal.    Results for orders placed or performed in visit on 05/22/20  WET PREP FOR [place], YEAST, CLUE   Specimen: Sterile Swab   Sterile Swab  Result Value Ref Range   Trichomonas Exam Negative Negative   Yeast Exam  Negative Negative   Clue Cell Exam Positive (A) Negative      Assessment & Plan:   Problem List Items Addressed This Visit       Cardiovascular and Mediastinum  Hypertension - Primary    Chronic, ongoing. BP in office today below goal.  Recommend she monitor at least a few times a week at home.  Continue Losartan 25 MG daily and adjust as needed -- she did not tolerate increase in this dose to 50 MG in past and had dizziness.   Focus on DASH diet at home.  Return to office in 3 months for BP check and reassurance.  Labs: CMP, TSH, Lipid.      Relevant Medications   atorvastatin (LIPITOR) 10 MG tablet   losartan (COZAAR) 50 MG tablet     Musculoskeletal and Integument   Onychomycosis    Treated with Penlac in past with improvement, toenail hanging off today and removed successfully with base remaining intact with ongoing fungal disease.        Other   Alkaline phosphatase elevation    Recheck CMP and GGT today -- monitor.      Relevant Orders   Gamma GT   Elevated hemoglobin A1c    Recheck A1C today, as trending up recent visit, and change plan of care as needed.  Consider medication if needed in future.      Relevant Orders   HgB A1c   Microalbumin, Urine Waived   Elevated TSH    Recheck TSH and Free T4 today, no current symptoms -- recent labs stable.  Reviewed biopsy and ENT notes.      Relevant Orders   TSH   T4, free   Hyperlipidemia    Chronic, ongoing.  Continues every other day statin which has decreased side effects.  Lipid panel and CMP today.  Adjust dose as needed.      Relevant Medications   atorvastatin (LIPITOR) 10 MG tablet   losartan (COZAAR) 50 MG tablet   Other Relevant Orders   Comprehensive metabolic panel   Lipid Panel w/o Chol/HDL Ratio   Proteinuria    Continue Losartan for kidney protection and recheck urine and CMP today.      Other Visit Diagnoses     Flu vaccine need       Flu vaccine today   Relevant Orders   Flu Vaccine  QUAD High Dose(Fluad) (Completed)        Follow up plan: Return in about 3 months (around 05/04/2021) for HTN/HLD, A1c check, TSH check.

## 2021-02-03 NOTE — Assessment & Plan Note (Signed)
Recheck CMP and GGT today -- monitor. 

## 2021-02-03 NOTE — Patient Instructions (Signed)
Dr. Margart Sickles WORK Massaging Gel Advanced Insoles  Fungal Nail Infection A fungal nail infection is a common infection of the toenails or fingernails. This condition affects toenails more often than fingernails. It often affects the great, or big, toes. More than one nail may be infected. The condition can be passed from person to person (is contagious). What are the causes? This condition is caused by a fungus, such as yeast or molds. Several types of fungi can cause the infection. These fungi are common in moist and warm areas. If your hands or feet come into contact with the fungus, it may get into a crack in your fingernail or toenail or in the surrounding skin, and cause an infection. What increases the risk? The following factors may make you more likely to develop this condition: Being of older age. Having certain medical conditions, such as: Athlete's foot. Diabetes. Poor circulation. A weak body defense system (immune system). Walking barefoot in areas where the fungus thrives, such as showers or locker rooms. Wearing shoes and socks that cause your feet to sweat. Having a nail injury or a recent nail surgery. What are the signs or symptoms? Symptoms of this condition include: A pale spot on the nail. Thickening of the nail. A nail that becomes yellow, brown, or white. A brittle or ragged nail edge. A nail that has lifted away from the nail bed. How is this diagnosed? This condition is diagnosed with a physical exam. Your health care provider may take a scraping or clipping from your nail to test for the fungus. How is this treated? Treatment is not needed for mild infections. If you have significant nail changes, treatment may include: Antifungal medicines taken by mouth (orally). You may need to take the medicine for several weeks or several months, and you may not see the results for a long time. These medicines can cause side effects. Ask your health care provider what  problems to watch for. Antifungal nail polish or nail cream. These may be used along with oral antifungal medicines. Laser treatment of the nail. Surgery to remove the nail. This may be needed for the most severe infections. It can take a long time, usually up to a year, for the infection to go away. The infection may also come back. Follow these instructions at home: Medicines Take or apply over-the-counter and prescription medicines only as told by your health care provider. Ask your health care provider about using over-the-counter mentholated ointment on your nails. Nail care Trim your nails often. Wash and dry your hands and feet every day. Keep your feet dry. To do this: Wear absorbent socks, and change your socks frequently. Wear shoes that allow air to circulate, such as sandals or canvas tennis shoes. Throw out old shoes. If you go to a nail salon, make sure you choose one that uses clean instruments. Use antifungal foot powder on your feet and in your shoes. General instructions Do not share personal items, such as towels or nail clippers. Do not walk barefoot in shower rooms or locker rooms. Wear rubber gloves if you are working with your hands in wet areas. Keep all follow-up visits. This is important. Contact a health care provider if: You have redness, pain, or pus near the toenail or fingernail. Your infection is not getting better, or it is getting worse after several months. You have more circulation problems near the toenail or fingernail. You have brown or black discoloration of the nail that spreads to the surrounding skin.  Summary A fungal nail infection is a common infection of the toenails or fingernails. Treatment is not needed for mild infections. If you have significant nail changes, treatment may include taking medicine orally and applying medicine to your nails. It can take a long time, usually up to a year, for the infection to go away. The infection may  also come back. Take or apply over-the-counter and prescription medicines only as told by your health care provider. This information is not intended to replace advice given to you by your health care provider. Make sure you discuss any questions you have with your health care provider. Document Revised: 05/05/2020 Document Reviewed: 05/05/2020 Elsevier Patient Education  2022 [employer].

## 2021-02-03 NOTE — Assessment & Plan Note (Signed)
Treated with Penlac in past with improvement, toenail hanging off today and removed successfully with base remaining intact with ongoing fungal disease.

## 2021-02-03 NOTE — Assessment & Plan Note (Signed)
Chronic, ongoing.  Continues every other day statin which has decreased side effects.  Lipid panel and CMP today.  Adjust dose as needed.

## 2021-02-03 NOTE — Assessment & Plan Note (Signed)
Recheck TSH and Free T4 today, no current symptoms -- recent labs stable.  Reviewed biopsy and ENT notes.

## 2021-02-03 NOTE — Assessment & Plan Note (Signed)
Chronic, ongoing. BP in office today below goal.  Recommend she monitor at least a few times a week at home.  Continue Losartan 25 MG daily and adjust as needed -- she did not tolerate increase in this dose to 50 MG in past and had dizziness.   Focus on DASH diet at home.  Return to office in 3 months for BP check and reassurance.  Labs: CMP, TSH, Lipid.

## 2021-02-04 LAB — COMPREHENSIVE METABOLIC PANEL
ALT: 15 IU/L (ref 0–32)
AST: 21 IU/L (ref 0–40)
Albumin/Globulin Ratio: 1.3 (ref 1.2–2.2)
Albumin: 4.2 g/dL (ref 3.8–4.8)
Alkaline Phosphatase: 145 IU/L — ABNORMAL HIGH (ref 44–121)
BUN/Creatinine Ratio: 17 (ref 12–28)
BUN: 14 mg/dL (ref 8–27)
Bilirubin Total: 0.3 mg/dL (ref 0.0–1.2)
CO2: 24 mmol/L (ref 20–29)
Calcium: 9.4 mg/dL (ref 8.7–10.3)
Chloride: 98 mmol/L (ref 96–106)
Creatinine, Ser: 0.83 mg/dL (ref 0.57–1.00)
Globulin, Total: 3.3 g/dL (ref 1.5–4.5)
Glucose: 96 mg/dL (ref 70–99)
Potassium: 4.4 mmol/L (ref 3.5–5.2)
Sodium: 136 mmol/L (ref 134–144)
Total Protein: 7.5 g/dL (ref 6.0–8.5)
eGFR: 76 mL/min/{1.73_m2} (ref >59)

## 2021-02-04 LAB — LIPID PANEL W/O CHOL/HDL RATIO
Cholesterol, Total: 168 mg/dL (ref 100–199)
HDL: 47 mg/dL (ref >39)
LDL Chol Calc (NIH): 104 mg/dL — ABNORMAL HIGH (ref 0–99)
Triglycerides: 91 mg/dL (ref 0–149)
VLDL Cholesterol Cal: 17 mg/dL (ref 5–40)

## 2021-02-04 LAB — HEMOGLOBIN A1C
Est. average glucose Bld gHb Est-mCnc: 123 mg/dL
Hgb A1c MFr Bld: 5.9 % — ABNORMAL HIGH (ref 4.8–5.6)

## 2021-02-04 LAB — TSH: TSH: 3.99 u[IU]/mL (ref 0.450–4.500)

## 2021-02-04 LAB — T4, FREE: Free T4: 1.02 ng/dL (ref 0.82–1.77)

## 2021-02-04 LAB — GAMMA GT: GGT: 20 IU/L (ref 0–60)

## 2021-02-04 NOTE — Progress Notes (Signed)
Good morning, please let Carol [name] know her labs have returned: - A1c remains in prediabetic range, but has come down some.  Continue focus on diet and regular activity at home. - Kidney and liver function remain stable. - Thyroid labs normal. - Cholesterol labs show LDL, bad cholesterol, slightly above goal.  Please ensure you are taking Atorvastatin as ordered and we may change this next visit if ongoing above goal.  Any questions? Keep being amazing!!  Thank you for allowing me to participate in your care.  I appreciate you. Kindest regards, Raevyn Sokol

## 2021-02-25 ENCOUNTER — Ambulatory Visit
Admission: RE | Admit: 2021-02-25 | Discharge: 2021-02-25 | Disposition: A | Discharge status: Home / Self Care | Payer: Medicare Other | Source: Ambulatory Visit | Attending: Nurse Practitioner | Admitting: Nurse Practitioner

## 2021-02-25 ENCOUNTER — Other Ambulatory Visit: Payer: Self-pay

## 2021-02-25 DIAGNOSIS — Z1231 Encounter for screening mammogram for malignant neoplasm of breast: Secondary | ICD-10-CM | POA: Insufficient documentation

## 2021-02-25 NOTE — Progress Notes (Signed)
Please let Catrena know mammogram is normal and can repeat in one year:)

## 2021-03-03 ENCOUNTER — Telehealth: Payer: Medicare Other

## 2021-03-03 ENCOUNTER — Ambulatory Visit (INDEPENDENT_AMBULATORY_CARE_PROVIDER_SITE_OTHER): Payer: Medicare Other

## 2021-03-03 ENCOUNTER — Telehealth: Payer: Self-pay

## 2021-03-03 DIAGNOSIS — E782 Mixed hyperlipidemia: Secondary | ICD-10-CM

## 2021-03-03 DIAGNOSIS — G8929 Other chronic pain: Secondary | ICD-10-CM

## 2021-03-03 DIAGNOSIS — I10 Essential (primary) hypertension: Secondary | ICD-10-CM

## 2021-03-03 DIAGNOSIS — M1990 Unspecified osteoarthritis, unspecified site: Secondary | ICD-10-CM

## 2021-03-03 DIAGNOSIS — M25561 Pain in right knee: Secondary | ICD-10-CM

## 2021-03-03 NOTE — Patient Instructions (Signed)
Visit Information  Thank you for taking time to visit with me today. Please don't hesitate to contact me if I can be of assistance to you before our next scheduled telephone appointment.  Following are the goals we discussed today:  RNCM Clinical Goal(s):  Patient will verbalize understanding of plan for management of HTN, HLD, and Osteoarthritis as evidenced by following the plan of care and working with the CCM team and pcp to optimize health and well being  take all medications exactly as prescribed and will call provider for medication related questions as evidenced by taking medications as directed and calling for refills before running out of medications     attend all scheduled medical appointments: 05-04-2021 at 1020 am with pcp  as evidenced by keeping appointments and calling for schedule changes        demonstrate improved and ongoing health management independence as evidenced by taking medications as directed, following a heart healthy diet, and preventive care and maintaining of health and well being        demonstrate a decrease in HTN, HLD, and Osteoarthritis exacerbations  as evidenced by working with the CCM team to optimize health and well being and effectively manage chronic condtions  demonstrate ongoing self health care management ability for effective management of chronic conditions  as evidenced by working with the CCM team through collaboration with Consulting civil engineer, provider, and care team.    Interventions: 1:1 collaboration with primary care provider regarding development and update of comprehensive plan of care as evidenced by provider attestation and co-signature Inter-disciplinary care team collaboration (see longitudinal plan of care) Evaluation of current treatment plan related to  self management and patient's adherence to plan as established by provider     Hyperlipidemia:  (Status: Goal on Track (progressing): YES.) Long Term Goal       Lab Results  Component  Value Date    CHOL 168 02/03/2021    HDL 47 02/03/2021    [place] 104 (H) 02/03/2021    TRIG 91 02/03/2021      Medication review performed; medication list updated in electronic medical record. 03-03-2021: The patient is compliant with medications. The patient denies any issues with medications compliance.  Provider established cholesterol goals reviewed. 03-03-2021: Review of goal of LDL. The patient is just over goal for LDL range. Education on watching for dietary restrictions.  Counseled on importance of regular laboratory monitoring as prescribed. 03-03-2021: Has regular labwork and testing; Provided HLD educational materials; Reviewed role and benefits of statin for ASCVD risk reduction; Discussed strategies to manage statin-induced myalgias; Reviewed importance of limiting foods high in cholesterol. 03-03-2021: Education on monitoring foods high in cholesterol.  Screening for signs and symptoms of depression related to chronic disease state;  Assessed social determinant of health barriers;    Hypertension: (Status: Goal on Track (progressing): YES.) Last practice recorded BP readings:     BP Readings from Last 3 Encounters:  02/03/21 128/78  11/10/20 132/80  09/03/20 122/72  Most recent eGFR/CrCl: No results found for: EGFR  No components found for: CRCL   Evaluation of current treatment plan related to hypertension self management and patient's adherence to plan as established by provider. 03-03-2021: The patient is in goal for blood pressures. Denies any issues with HTN or heart health. Will continue to monitor. ;   Provided education to patient re: stroke prevention, s/s of heart attack and stroke; Reviewed prescribed diet heart healthy  Reviewed medications with patient and discussed importance of  compliance. 03-03-2021: Denies any issues with medications compliance, states she takes her medications as ordered;  Discussed plans with patient for ongoing care management follow up  and provided patient with direct contact information for care management team; Advised patient, providing education and rationale, to monitor blood pressure daily and record, calling PCP for findings outside established parameters. 12-31-2020: The patient does not like to take her blood pressures at home, she states that it makes her nervous and upset especially if it is up. The patient denies headaches, states she is sleeping well. Her daughter and granddaughter live with her and she is thankful for this. She is working a part time job at [employer] and is happy about the income to help pay her bills. She denies any acute findings today.  03-03-2021: The patient states she gets upset when she checks her blood pressures at home. She is having some hip and knee pain but cannot get in to see the specialist until March. The patient is working part time and is still very active. Will continue to monitor.  Advised patient to discuss blood pressure trends with provider; Provided education on prescribed diet heart healthy;  Discussed complications of poorly controlled blood pressure such as heart disease, stroke, circulatory complications, vision complications, kidney impairment, sexual dysfunction;    Pain:  (Status: Goal on Track (progressing): YES.) Long Term Goal  Pain assessment performed. 12-31-2020: The patient states that she has some arthritis pain but she rest when she needs to. States she is only working part time and will continue to do so for now. She denies any pain today but knows her limits. She is excited about next week cooking and being with her family. 03-03-2021: The patient is having pain in her right knee and hip. Rates it at a 6 today on a scale of 0-10. The patient states she cannot get into see Dr. Jalene Mullet until March. He is the one who did her surgeries. She states that she is being careful and denies any acute issues. Discussed being seen at [facility] for unresolved pain or intense changes  in pain. The patient states she feels like she can wait until March to see the specialist. Will continue to monitor for changes.  Medications reviewed Reviewed provider established plan for pain management; Discussed importance of adherence to all scheduled medical appointments. March to see specialist and 05-04-2021 at 1020 am to see pcp Counseled on the importance of reporting any/all new or changed pain symptoms or management strategies to pain management provider; Advised patient to report to care team affect of pain on daily activities; Discussed use of relaxation techniques and/or diversional activities to assist with pain reduction (distraction, imagery, relaxation, massage, acupressure, TENS, heat, and cold application; Reviewed with patient prescribed pharmacological and nonpharmacological pain relief strategies; Advised patient to discuss unresolved pain, changes in level or intensity of pain with provider;   Patient Goals/Self-Care Activities: Patient will self administer medications as prescribed as evidenced by self report/primary caregiver report  Patient will attend all scheduled provider appointments as evidenced by clinician review of documented attendance to scheduled appointments and patient/caregiver report Patient will call pharmacy for medication refills as evidenced by patient report and review of pharmacy fill history as appropriate Patient will attend church or other social activities as evidenced by patient report Patient will continue to perform ADL's independently as evidenced by patient/caregiver report Patient will continue to perform IADL's independently as evidenced by patient/caregiver report Patient will call provider office for new concerns  or questions as evidenced by review of documented incoming telephone call notes and patient report Patient will work with BSW to address care coordination needs and will continue to work with the clinical team to address health  care and disease management related needs as evidenced by documented adherence to scheduled care management/care coordination appointments - check blood pressure weekly - choose a place to take my blood pressure (home, clinic or office, retail store) - write blood pressure results in a log or diary - learn about high blood pressure - keep a blood pressure log - take blood pressure log to all doctor appointments - call doctor for signs and symptoms of high blood pressure - develop an action plan for high blood pressure - keep all doctor appointments - take medications for blood pressure exactly as prescribed - begin an exercise program - report new symptoms to your doctor - eat more whole grains, fruits and vegetables, lean meats and healthy fats - call for medicine refill 2 or 3 days before it runs out - take all medications exactly as prescribed - call doctor with any symptoms you believe are related to your medicine - call doctor when you experience any new symptoms - go to all doctor appointments as scheduled - adhere to prescribed diet: heart healthy diet           Our next appointment is by telephone on 05-12-2021 at 0900 am  Please call the care guide team at [phone] if you need to cancel or reschedule your appointment.   If you are experiencing a Mental Health or [place] or need someone to talk to, please call the Suicide and Crisis Lifeline: 988 call the [place] National Suicide Prevention Lifeline: 9132056290 or TTY: 270-533-0461 TTY 320-129-4045) to talk to a trained counselor call 1-800-273-TALK (toll free, 24 hour hotline)   The patient verbalized understanding of instructions, educational materials, and care plan provided today and declined offer to receive copy of patient instructions, educational materials, and care plan.   [name] RN, MSN, [facility] Mobile: [phone]

## 2021-03-03 NOTE — Telephone Encounter (Signed)
°  Care Management   Follow Up Note   03/03/2021 Name: Carol [name] MRN: [id number] DOB: 10-Jun-1950   Referred by: Marjie Skiff, NP Reason for referral : Chronic Care Management (RNCM: Follow up for Chronic Disease Management and Care Coordination Needs )   RNCM made a call back to the patient and completed the call. See new encounter.   Follow Up Plan: Telephone follow up appointment with care management team member scheduled for: 05-12-2021 at 9 am  Alto Denver RN, MSN, CCM Community Care Coordinator [place]   [facility] Mobile: [phone]

## 2021-03-03 NOTE — Chronic Care Management (AMB) (Signed)
Chronic Care Management   CCM RN Visit Note  03/03/2021 Name: Carol [name] MRN: [id number] DOB: 1950/09/23  Subjective: Carol [name] is a 71 y.o. year old female who is a primary care patient of Cannady, Barbaraann Faster, NP. The care management team was consulted for assistance with disease management and care coordination needs.    Engaged with patient by telephone for follow up visit in response to provider referral for case management and/or care coordination services.   Consent to Services:  The patient was given information about Chronic Care Management services, agreed to services, and gave verbal consent prior to initiation of services.  Please see initial visit note for detailed documentation.   Patient agreed to services and verbal consent obtained.   Assessment: Review of patient past medical history, allergies, medications, health status, including review of consultants reports, laboratory and other test data, was performed as part of comprehensive evaluation and provision of chronic care management services.   SDOH (Social Determinants of Health) assessments and interventions performed:    CCM Care Plan  No Known Allergies  Outpatient Encounter Medications as of 03/03/2021  Medication Sig   aspirin 81 MG tablet Take 81 mg by mouth daily.   atorvastatin (LIPITOR) 10 MG tablet TAKE 1 TABLET BY MOUTH EVERY OTHER DAY   ciclopirox (PENLAC) 8 % solution Apply topically at bedtime. Apply over nail and surrounding skin. Apply daily over previous coat. After seven (7) days, may remove with alcohol and continue cycle.   fluticasone (FLONASE) 50 MCG/ACT nasal spray SPRAY 2 SPRAYS INTO EACH NOSTRIL ONCE A DAY   guaiFENesin (ROBITUSSIN) 100 MG/5ML SOLN Take 5 mLs (100 mg total) by mouth every 4 (four) hours as needed for cough or to loosen phlegm.   loratadine (CLARITIN) 10 MG tablet Take 10 mg by mouth daily.   losartan (COZAAR) 50 MG tablet TAKE 1/2 TABLET (25 MG TOTAL) BY MOUTH DAILY    meclizine (ANTIVERT) 25 MG tablet Take 1 tablet (25 mg total) by mouth 3 (three) times daily as needed for dizziness.   meloxicam (MOBIC) 15 MG tablet Take 1 tablet (15 mg total) by mouth daily.   triamcinolone (KENALOG) 0.1 % Apply 1 application topically 2 (two) times daily.   No facility-administered encounter medications on file as of 03/03/2021.    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    Conditions to be addressed/monitored:HTN, HLD, and Osteoarthritis  Care Plan : RNCM: General Plan of Care (Adult) for Chronic Disease Management and Care Coordination Needs  Updates made by Vanita Ingles, RN since 03/03/2021 12:00 AM     Problem: RNCM: Development of Plan of Care for Chronic Disease (HTN, HLD, OA)   Priority: High     Long-Range Goal: RNCM: Effective Management  of Plan of Care for Chronic Disease (HTN, HLD, OA)   Start Date: 12/31/2020  Expected End Date: 12/31/2021  Note:   Current Barriers:  Knowledge Deficits related to plan of care for management of HTN, HLD,  and chronic pain   Chronic Disease Management support and education needs related to HTN, HLD, and chronic pain   RNCM Clinical Goal(s):  Patient will verbalize understanding of plan for management of HTN, HLD, and Osteoarthritis as evidenced by following the plan of care and working with the CCM team and pcp to optimize health and well being  take all medications exactly as prescribed and  will call provider for medication related questions as evidenced by taking medications as directed and calling for refills before running out of medications     attend all scheduled medical appointments: 05-04-2021 at 1020 am with pcp  as evidenced by keeping appointments and calling for schedule changes        demonstrate improved and ongoing health management independence as evidenced by taking medications as directed, following a heart healthy diet, and preventive care and maintaining of health and well being        demonstrate a decrease in HTN, HLD, and Osteoarthritis exacerbations  as evidenced by working with the CCM team to optimize health and well being and effectively manage chronic condtions  demonstrate ongoing self health care management ability for effective management of chronic conditions  as evidenced by working with the CCM team through collaboration with Consulting civil engineer, provider, and care team.   Interventions: 1:1 collaboration with primary care provider regarding development and update of comprehensive plan of care as evidenced by provider attestation and co-signature Inter-disciplinary care team collaboration (see longitudinal plan of care) Evaluation of current treatment plan related to  self management and patient's adherence to plan as established by provider   Hyperlipidemia:  (Status: Goal on Track (progressing): YES.) Long Term Goal  Lab Results  Component Value Date   CHOL 168 02/03/2021   HDL 47 02/03/2021   [place] 104 (H) 02/03/2021   TRIG 91 02/03/2021     Medication review performed; medication list updated in electronic medical record. 03-03-2021: The patient is compliant with medications. The patient denies any issues with medications compliance.  Provider established cholesterol goals reviewed. 03-03-2021: Review of goal of LDL. The patient is just over goal for LDL range. Education on watching for dietary restrictions.  Counseled on importance of regular  laboratory monitoring as prescribed. 03-03-2021: Has regular labwork and testing; Provided HLD educational materials; Reviewed role and benefits of statin for ASCVD risk reduction; Discussed strategies to manage statin-induced myalgias; Reviewed importance of limiting foods high in cholesterol. 03-03-2021: Education on monitoring foods high in cholesterol.  Screening for signs and symptoms of depression related to chronic disease state;  Assessed social determinant of health barriers;   Hypertension: (Status: Goal on Track (progressing): YES.) Last practice recorded BP readings:  BP Readings from Last 3 Encounters:  02/03/21 128/78  11/10/20 132/80  09/03/20 122/72  Most recent eGFR/CrCl: No results found for: EGFR  No components found for: CRCL  Evaluation of current treatment plan related to hypertension self management and patient's adherence to plan as established by provider. 03-03-2021: The patient is in goal for blood pressures. Denies any issues with HTN or heart health. Will continue to monitor. ;   Provided education to patient re: stroke prevention, s/s of heart attack and stroke; Reviewed prescribed diet heart healthy  Reviewed medications with patient and discussed importance of compliance. 03-03-2021: Denies any issues with medications compliance, states she takes her medications as ordered;  Discussed plans with patient for ongoing care management follow up and provided patient with direct contact information for care management team; Advised  patient, providing education and rationale, to monitor blood pressure daily and record, calling PCP for findings outside established parameters. 12-31-2020: The patient does not like to take her blood pressures at home, she states that it makes her nervous and upset especially if it is up. The patient denies headaches, states she is sleeping well. Her daughter and granddaughter live with her and she is thankful for this. She is working a part  time job at [employer] and is happy about the income to help pay her bills. She denies any acute findings today.  03-03-2021: The patient states she gets upset when she checks her blood pressures at home. She is having some hip and knee pain but cannot get in to see the specialist until March. The patient is working part time and is still very active. Will continue to monitor.  Advised patient to discuss blood pressure trends with provider; Provided education on prescribed diet heart healthy;  Discussed complications of poorly controlled blood pressure such as heart disease, stroke, circulatory complications, vision complications, kidney impairment, sexual dysfunction;   Pain:  (Status: Goal on Track (progressing): YES.) Long Term Goal  Pain assessment performed. 12-31-2020: The patient states that she has some arthritis pain but she rest when she needs to. States she is only working part time and will continue to do so for now. She denies any pain today but knows her limits. She is excited about next week cooking and being with her family. 03-03-2021: The patient is having pain in her right knee and hip. Rates it at a 6 today on a scale of 0-10. The patient states she cannot get into see Dr. Jalene Mullet until March. He is the one who did her surgeries. She states that she is being careful and denies any acute issues. Discussed being seen at [facility] for unresolved pain or intense changes in pain. The patient states she feels like she can wait until March to see the specialist. Will continue to monitor for changes.  Medications reviewed Reviewed provider established plan for pain management; Discussed importance of adherence to all scheduled medical appointments. March to see specialist and 05-04-2021 at 1020 am to see pcp Counseled on the importance of reporting any/all new or changed pain symptoms or management strategies to pain management provider; Advised patient to report to care team affect of pain on  daily activities; Discussed use of relaxation techniques and/or diversional activities to assist with pain reduction (distraction, imagery, relaxation, massage, acupressure, TENS, heat, and cold application; Reviewed with patient prescribed pharmacological and nonpharmacological pain relief strategies; Advised patient to discuss unresolved pain, changes in level or intensity of pain with provider;  Patient Goals/Self-Care Activities: Patient will self administer medications as prescribed as evidenced by self report/primary caregiver report  Patient will attend all scheduled provider appointments as evidenced by clinician review of documented attendance to scheduled appointments and patient/caregiver report Patient will call pharmacy for medication refills as evidenced by patient report and review of pharmacy fill history as appropriate Patient will attend church or other social activities as evidenced by patient report Patient will continue to perform ADL's independently as evidenced by patient/caregiver report Patient will continue to perform IADL's independently as evidenced by patient/caregiver report Patient will call provider office for new concerns or questions as evidenced by review of documented incoming telephone call notes and patient report Patient will work with BSW to address care coordination needs and will continue to work with the clinical team to address health care and disease  management related needs as evidenced by documented adherence to scheduled care management/care coordination appointments - check blood pressure weekly - choose a place to take my blood pressure (home, clinic or office, retail store) - write blood pressure results in a log or diary - learn about high blood pressure - keep a blood pressure log - take blood pressure log to all doctor appointments - call doctor for signs and symptoms of high blood pressure - develop an action plan for high blood pressure -  keep all doctor appointments - take medications for blood pressure exactly as prescribed - begin an exercise program - report new symptoms to your doctor - eat more whole grains, fruits and vegetables, lean meats and healthy fats - call for medicine refill 2 or 3 days before it runs out - take all medications exactly as prescribed - call doctor with any symptoms you believe are related to your medicine - call doctor when you experience any new symptoms - go to all doctor appointments as scheduled - adhere to prescribed diet: heart healthy diet        Plan:Telephone follow up appointment with care management team member scheduled for:  05-12-2021 at 0900 am  [place], MSN, [facility] Mobile: [phone]

## 2021-03-14 IMAGING — US US THYROID
1 series · 13 of 25 positions shown · non-contrast
Comparison: None.

CLINICAL DATA: Goiter.  69-year-old female with thyroid nodule.

EXAM:
THYROID ULTRASOUND
TECHNIQUE: Ultrasound examination of the thyroid gland and adjacent soft
tissues was performed.

[Series 1: us thyroid · 0.07mm/px · 13 of 40 slices shown]
[im 1/40]
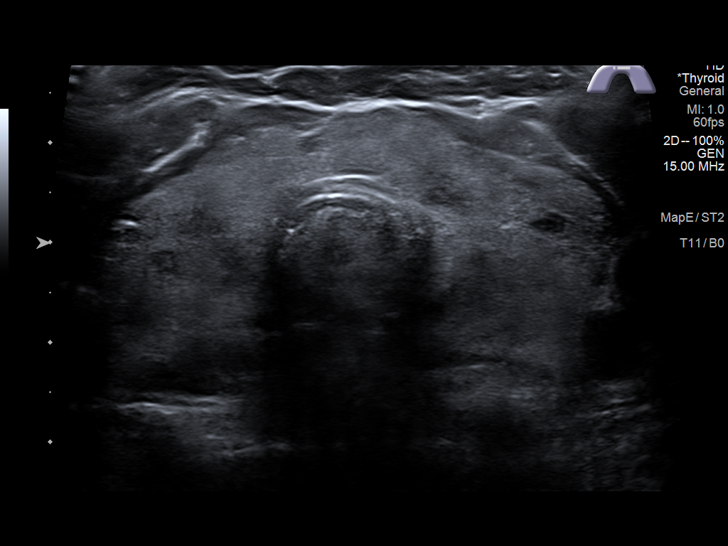
[im 4/40]
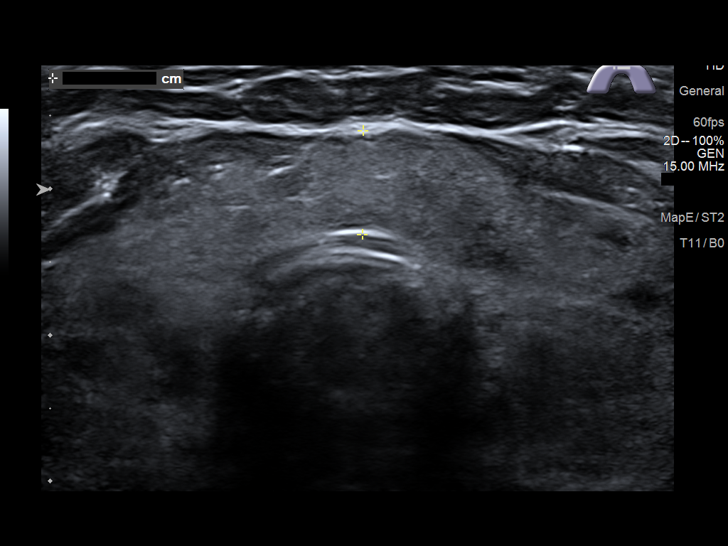
[im 7/40]
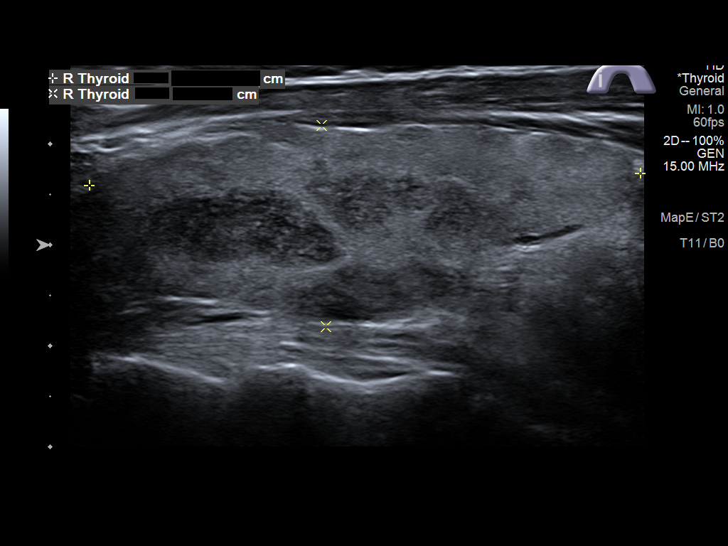
[im 10/40]
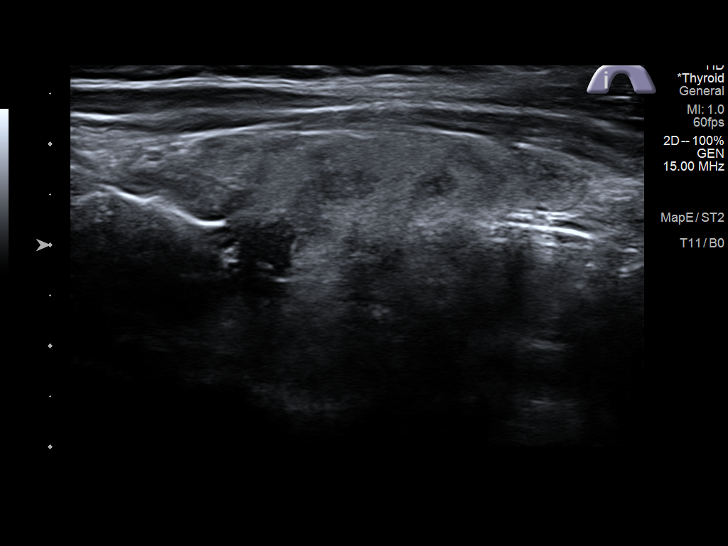
[im 14/40]
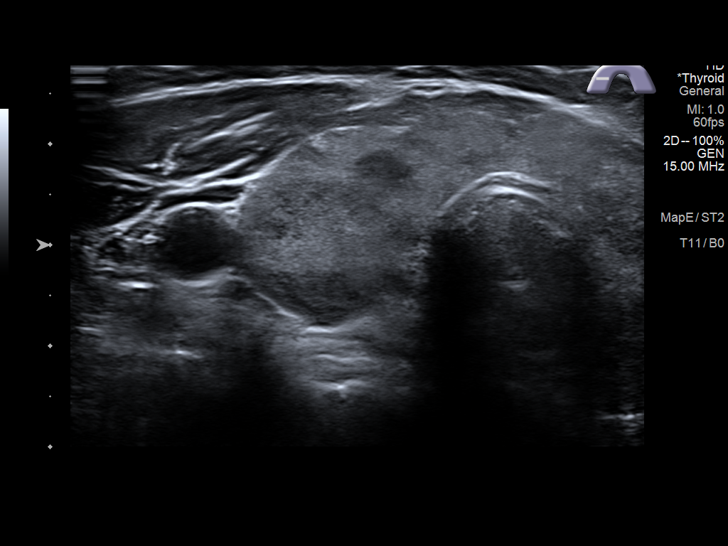
[im 17/40]
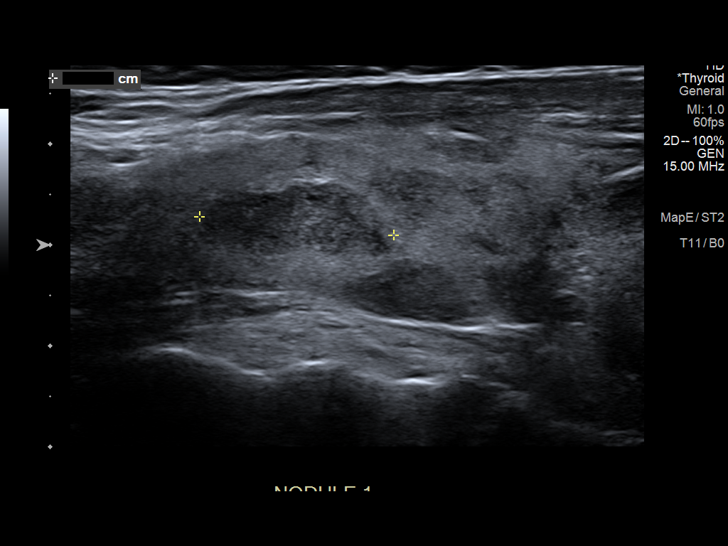
[im 20/40]
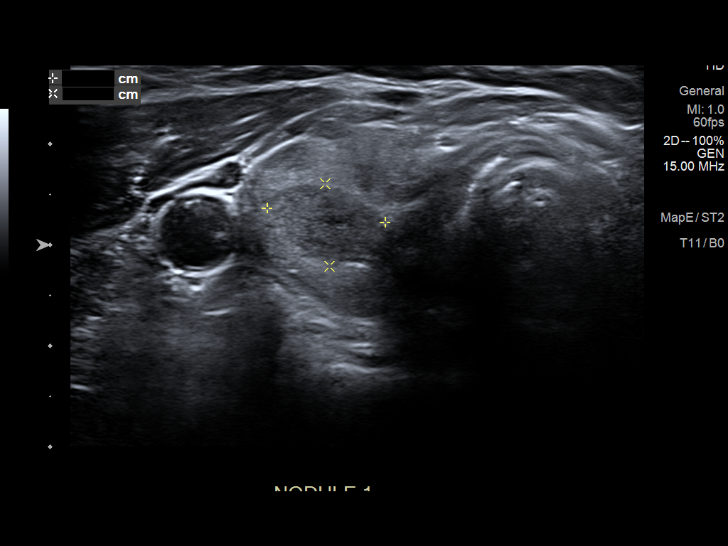
[im 23/40]
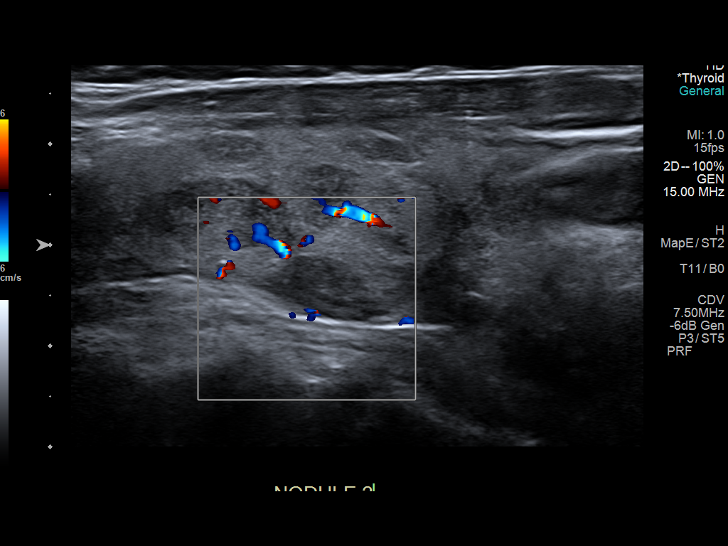
[im 27/40]
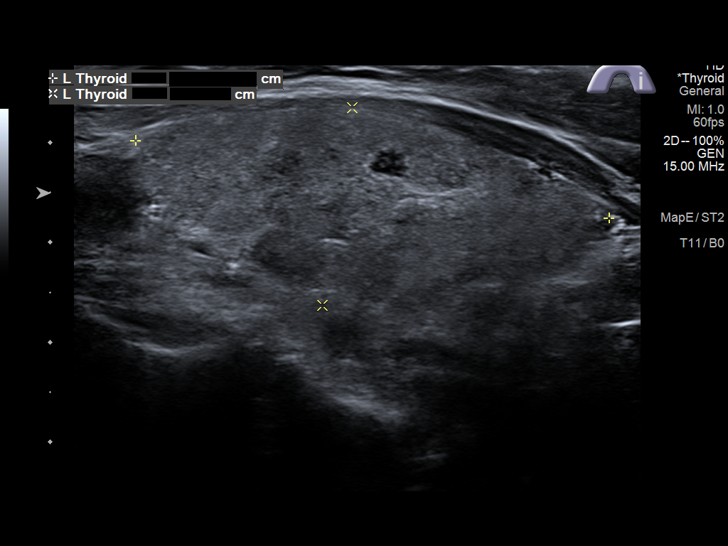
[im 30/40]
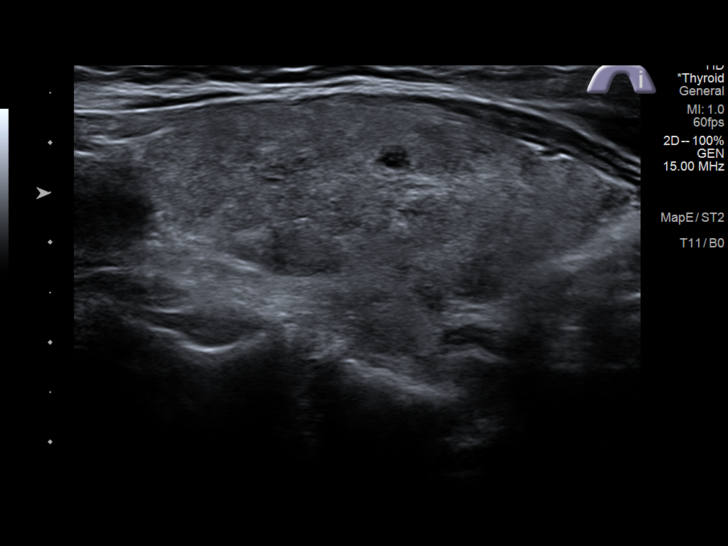
[im 33/40]
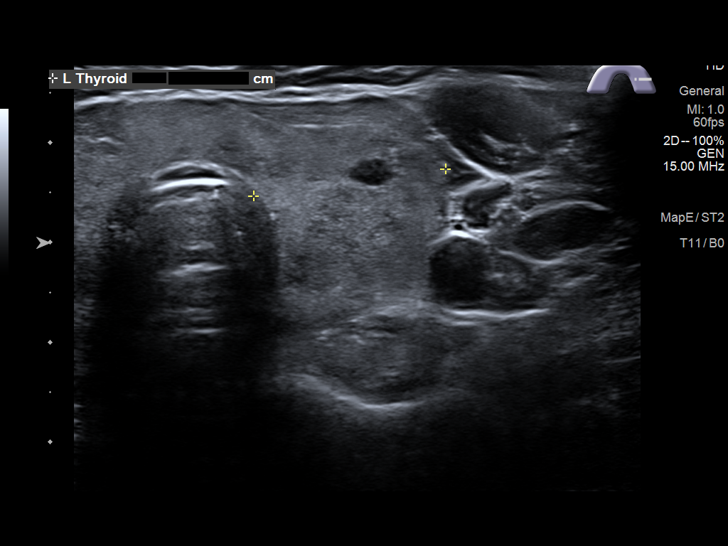
[im 36/40]
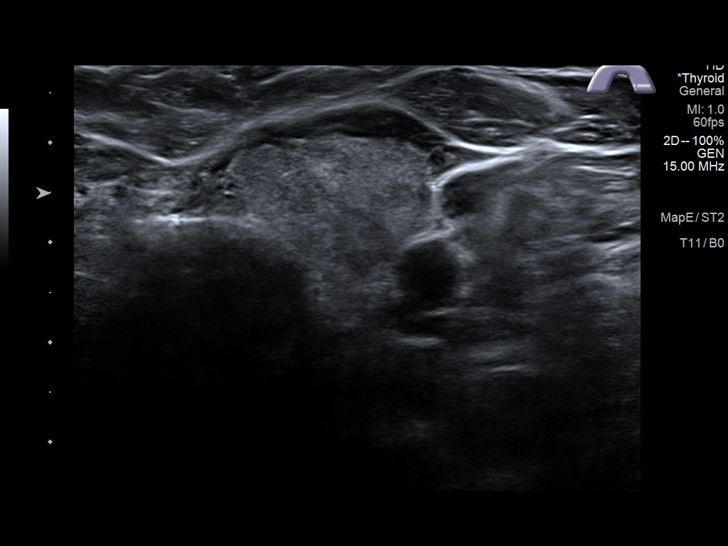
[im 40/40]
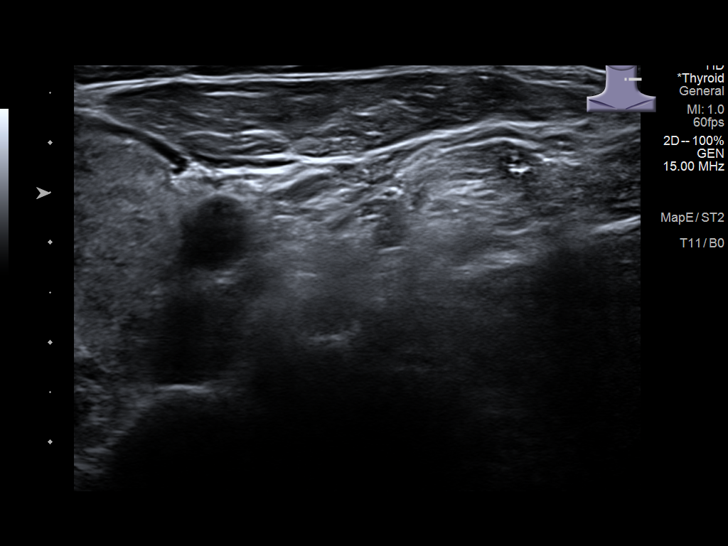

[13 of 25 positions shown; findings below may reference images not displayed]

FINDINGS: Parenchymal Echotexture: Moderately heterogenous

Isthmus: 0.7 cm

Right lobe: 5.5 x 2.0 x 2.2 cm

Left lobe: 4.8 x 2.0 x 1.9 cm

_________________________________________________________

Estimated total number of nodules >/= 1 cm: 2

Number of spongiform nodules >/=  2 cm not described below (TR1): 0

Number of mixed cystic and solid nodules >/= 1.5 cm not described
below (TR2): 0

_________________________________________________________

Nodule # 1:

Location: Right; Superior

Maximum size: 1.9 cm; Other 2 dimensions: 1.2 x 0.8 cm

Composition: solid/almost completely solid (2)

Echogenicity: hypoechoic (2)

Shape: not taller-than-wide (0)

Margins: ill-defined (0)

Echogenic foci: none (0)

ACR TI-RADS total points: 4.

ACR TI-RADS risk category: TR4 (4-6 points).

ACR TI-RADS recommendations:

**Given size (>/= 1.5 cm) and appearance, fine needle aspiration of
this moderately suspicious nodule should be considered based on
TI-RADS criteria.

_________________________________________________________

Nodule # 2:

Location: Right; Inferior

Maximum size: 1.3 cm; Other 2 dimensions: 1.1 x 0.6 cm

Composition: solid/almost completely solid (2)

Echogenicity: hypoechoic (2)

Shape: not taller-than-wide (0)

Margins: ill-defined (0)

Echogenic foci: none (0)

ACR TI-RADS total points: 4.

ACR TI-RADS risk category: TR4 (4-6 points).

ACR TI-RADS recommendations:

*Given size (>/= 1 - 1.4 cm) and appearance, a follow-up ultrasound
in 1 year should be considered based on TI-RADS criteria.

_________________________________________________________
IMPRESSION: 1. Multinodular goiter.
2. Right superior thyroid nodule (#1) Meets criteria for
ultrasound-guided tissue sampling.
3. Right inferior thyroid nodule (#2) meets criteria for 1 year
ultrasound follow-up.

The above is in keeping with the ACR TI-RADS recommendations - [HOSPITAL] 3043;[DATE].

## 2021-03-17 ENCOUNTER — Ambulatory Visit (INDEPENDENT_AMBULATORY_CARE_PROVIDER_SITE_OTHER): Payer: Medicare Other | Admitting: Internal Medicine

## 2021-03-17 ENCOUNTER — Encounter: Payer: Self-pay | Admitting: Internal Medicine

## 2021-03-17 ENCOUNTER — Other Ambulatory Visit: Payer: Self-pay

## 2021-03-17 VITALS — BP 136/60 | HR 89 | Temp 98.0°F | Ht 62.99 in | Wt 152.8 lb

## 2021-03-17 DIAGNOSIS — M25532 Pain in left wrist: Secondary | ICD-10-CM | POA: Insufficient documentation

## 2021-03-17 DIAGNOSIS — M79602 Pain in left arm: Secondary | ICD-10-CM | POA: Diagnosis not present

## 2021-03-17 DIAGNOSIS — I10 Essential (primary) hypertension: Secondary | ICD-10-CM

## 2021-03-17 DIAGNOSIS — M1990 Unspecified osteoarthritis, unspecified site: Secondary | ICD-10-CM

## 2021-03-17 DIAGNOSIS — E782 Mixed hyperlipidemia: Secondary | ICD-10-CM

## 2021-03-17 MED ORDER — LIDOCAINE 5 % EX PTCH: 1.0000 | MEDICATED_PATCH | CUTANEOUS | 0 refills | Status: DC

## 2021-03-17 MED ORDER — DICLOFENAC SODIUM 1 % EX GEL: 2.0000 g | Freq: Four times a day (QID) | CUTANEOUS | 2 refills | Status: DC

## 2021-03-17 NOTE — Progress Notes (Signed)
BP 136/60    Pulse 89    Temp 98 F (36.7 C) (Oral)    Ht 5' 2.99" (1.6 m)    Wt 152 lb 12.8 oz (69.3 kg)    LMP  (LMP Unknown)    SpO2 94%    BMI 27.07 kg/m    Subjective:    Patient ID: Carol [name], female    DOB: 13-Oct-1950, 71 y.o.   MRN: [id number]  Chief Complaint  Patient presents with   Arm Pain    Left arm pain since this weekend, maybe Friday,Saturday, or Sunday. Patient states that Left arm is not painful at this time.     HPI: Carol [name] is a 71 y.o. female  Arm Pain  The incident occurred more than 1 week ago (mobic helped per pt. has a ho arthritis to see ortho in march , feels like she has the same in her upper arm.). 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 The injury mechanism was repetitive motion. The pain is present in the left shoulder. The quality of the pain is described as aching. The pain is at a severity of 6/10. The pain is moderate. The pain has been Fluctuating since the incident. Pertinent negatives include no chest pain, muscle weakness, numbness or tingling. The symptoms are aggravated by movement. She has tried ice and heat for the symptoms. The treatment provided mild relief.   Chief Complaint  Patient presents with   Arm Pain    Left arm pain since this weekend, maybe Friday,Saturday, or Sunday. Patient states that Left arm is not painful at this time.     Relevant past medical, surgical, family and social history reviewed and updated as indicated. Interim medical history since our last visit reviewed. Allergies and medications reviewed and updated.  Review of Systems  Cardiovascular:  Negative for chest pain.  Neurological:  Negative for tingling and numbness.   Per HPI unless specifically indicated above     Objective:    BP 136/60    Pulse 89    Temp 98 F (36.7 C) (Oral)    Ht 5' 2.99" (1.6 m)    Wt 152 lb 12.8 oz (69.3 kg)    LMP  (LMP Unknown)    SpO2  94%    BMI 27.07 kg/m   Wt Readings from Last 3 Encounters:  03/17/21 152 lb 12.8 oz (69.3 kg)  02/03/21 153 lb 9.6 oz (69.7 kg)  11/10/20 159 lb 3.2 oz (72.2 kg)    Physical Exam Constitutional:      Appearance: Normal appearance. She is normal weight.  Musculoskeletal:        General: No swelling, tenderness, deformity or signs of injury.     Right lower leg: No edema.     Left lower leg: No edema.  Skin:    General: Skin is warm.  Neurological:     Mental Status: She is alert.  Psychiatric:        Mood and Affect: Mood normal.        Behavior: Behavior normal.        Thought Content: Thought content normal.        Judgment: Judgment normal.    Results for orders placed or performed in visit on 02/03/21  HgB A1c  Result Value Ref Range   Hgb A1c MFr Bld 5.9 (H) 4.8 - 5.6 %   Est. average  glucose Bld gHb Est-mCnc 123 mg/dL  Comprehensive metabolic panel  Result Value Ref Range   Glucose 96 70 - 99 mg/dL   BUN 14 8 - 27 mg/dL   Creatinine, Ser 0.83 0.57 - 1.00 mg/dL   eGFR 76 >59 mL/min/1.73   BUN/Creatinine Ratio 17 12 - 28   Sodium 136 134 - 144 mmol/L   Potassium 4.4 3.5 - 5.2 mmol/L   Chloride 98 96 - 106 mmol/L   CO2 24 20 - 29 mmol/L   Calcium 9.4 8.7 - 10.3 mg/dL   Total Protein 7.5 6.0 - 8.5 g/dL   Albumin 4.2 3.8 - 4.8 g/dL   Globulin, Total 3.3 1.5 - 4.5 g/dL   Albumin/Globulin Ratio 1.3 1.2 - 2.2   Bilirubin Total 0.3 0.0 - 1.2 mg/dL   Alkaline Phosphatase 145 (H) 44 - 121 IU/L   AST 21 0 - 40 IU/L   ALT 15 0 - 32 IU/L  Lipid Panel w/o Chol/HDL Ratio  Result Value Ref Range   Cholesterol, Total 168 100 - 199 mg/dL   Triglycerides 91 0 - 149 mg/dL   HDL 47 >39 mg/dL   VLDL Cholesterol Cal 17 5 - 40 mg/dL   LDL Chol Calc (NIH) 104 (H) 0 - 99 mg/dL  TSH  Result Value Ref Range   TSH 3.990 0.450 - 4.500 uIU/mL  T4, free  Result Value Ref Range   Free T4 1.02 0.82 - 1.77 ng/dL  Microalbumin, Urine Waived  Result Value Ref Range   Microalb, Ur  Waived 30 (H) 0 - 19 mg/L   Creatinine, Urine Waived 50 10 - 300 mg/dL   Microalb/Creat Ratio 30-300 (H) <30 mg/g  Gamma GT  Result Value Ref Range   GGT 20 0 - 60 IU/L        Current Outpatient Medications:    aspirin 81 MG tablet, Take 81 mg by mouth daily., Disp: , Rfl:    atorvastatin (LIPITOR) 10 MG tablet, TAKE 1 TABLET BY MOUTH EVERY OTHER DAY, Disp: 45 tablet, Rfl: 4   diclofenac Sodium (VOLTAREN) 1 % GEL, Apply 2 g topically 4 (four) times daily., Disp: 2 g, Rfl: 2   fluticasone (FLONASE) 50 MCG/ACT nasal spray, SPRAY 2 SPRAYS INTO EACH NOSTRIL ONCE A DAY, Disp: 16 g, Rfl: 1   guaiFENesin (ROBITUSSIN) 100 MG/5ML SOLN, Take 5 mLs (100 mg total) by mouth every 4 (four) hours as needed for cough or to loosen phlegm., Disp: 236 mL, Rfl: 0   lidocaine (LIDODERM) 5 %, Place 1 patch onto the skin daily. Remove & Discard patch within 12 hours or as directed by MD, Disp: 30 patch, Rfl: 0   loratadine (CLARITIN) 10 MG tablet, Take 10 mg by mouth daily., Disp: , Rfl:    losartan (COZAAR) 50 MG tablet, TAKE 1/2 TABLET (25 MG TOTAL) BY MOUTH DAILY, Disp: 45 tablet, Rfl: 4   meclizine (ANTIVERT) 25 MG tablet, Take 1 tablet (25 mg total) by mouth 3 (three) times daily as needed for dizziness., Disp: 60 tablet, Rfl: 4   meloxicam (MOBIC) 15 MG tablet, Take 1 tablet (15 mg total) by mouth daily., Disp: 90 tablet, Rfl: 4   triamcinolone (KENALOG) 0.1 %, Apply 1 application topically 2 (two) times daily., Disp: 30 g, Rfl: 0   ciclopirox (PENLAC) 8 % solution, Apply topically at bedtime. Apply over nail and surrounding skin. Apply daily over previous coat. After seven (7) days, may remove with alcohol and continue cycle. (Patient not taking:  Reported on 03/17/2021), Disp: 6.6 mL, Rfl: 0    Assessment & Plan:  Left arm pain ?musculoskeletal pain  Will need to take movbic as needed advcied to take 7.5 mg  Will try lidoderm  Declines xrays at this point to fu with pcp if worsens.   Problem List Items  Addressed This Visit   None   No orders of the defined types were placed in this encounter.    Meds ordered this encounter  Medications   lidocaine (LIDODERM) 5 %    Sig: Place 1 patch onto the skin daily. Remove & Discard patch within 12 hours or as directed by MD    Dispense:  30 patch    Refill:  0   diclofenac Sodium (VOLTAREN) 1 % GEL    Sig: Apply 2 g topically 4 (four) times daily.    Dispense:  2 g    Refill:  2     Follow up plan: No follow-ups on file.

## 2021-03-27 ENCOUNTER — Telehealth: Payer: Self-pay | Admitting: Nurse Practitioner

## 2021-03-27 NOTE — Telephone Encounter (Signed)
Copied from [facility] [id number]. Topic: General - Other >> Mar 27, 2021  8:09 AM Leward Quan A wrote: Reason for CRM: Patient called in to inform Marnee Guarneri that she want to get an Xray on her left arm that she was seen for last week that is still bothering her . She ask that this is done ASAP please can be reached at Ph# [phone]

## 2021-03-27 NOTE — Telephone Encounter (Addendum)
Call to patient - patient states the medication helps with the pain- but patient still has pain when raising arm. Patient would like to know what next step would be. Would imaging help to decide best course of treatment?

## 2021-03-27 NOTE — Telephone Encounter (Signed)
Pt scheduled 2/15 °

## 2021-03-27 NOTE — Telephone Encounter (Signed)
Noted  

## 2021-04-01 ENCOUNTER — Other Ambulatory Visit: Payer: Self-pay

## 2021-04-01 ENCOUNTER — Ambulatory Visit (INDEPENDENT_AMBULATORY_CARE_PROVIDER_SITE_OTHER): Payer: Medicare Other | Admitting: Nurse Practitioner

## 2021-04-01 ENCOUNTER — Ambulatory Visit
Admission: RE | Admit: 2021-04-01 | Discharge: 2021-04-01 | Disposition: A | Discharge status: Home / Self Care | Payer: Medicare Other | Attending: Nurse Practitioner | Admitting: Nurse Practitioner

## 2021-04-01 ENCOUNTER — Ambulatory Visit
Admission: RE | Admit: 2021-04-01 | Discharge: 2021-04-01 | Disposition: A | Discharge status: Home / Self Care | Payer: Medicare Other | Source: Ambulatory Visit | Attending: Nurse Practitioner | Admitting: Nurse Practitioner

## 2021-04-01 ENCOUNTER — Encounter: Payer: Self-pay | Admitting: Nurse Practitioner

## 2021-04-01 VITALS — BP 130/70 | HR 88 | Temp 98.1°F | Ht 62.99 in | Wt 153.8 lb

## 2021-04-01 DIAGNOSIS — G8929 Other chronic pain: Secondary | ICD-10-CM | POA: Diagnosis present

## 2021-04-01 DIAGNOSIS — M25512 Pain in left shoulder: Secondary | ICD-10-CM | POA: Insufficient documentation

## 2021-04-01 NOTE — Assessment & Plan Note (Signed)
Ongoing now for several weeks, suspect some underlying arthritic changes + possible shoulder impingement.  Will obtain imaging to further assess shoulder.  Continue Mobic as needed + Lidocaine patches and cream OTC.  Recommend continued use of ice and heat as needed.  Return to office in 3 weeks for follow-up, sooner if worsening. Will consider PT or ortho referral if ongoing.

## 2021-04-01 NOTE — Progress Notes (Signed)
Good morning crew, please let Carol [name] know her imaging has returned, does noticed some mild arthritic changes.  There also have been noted to be a possible cyst to joint area or rotator cuff issues, I would recommend referral to ortho for further assessment of area.  Would you be okay with this?  If so let me know and I will place referral.  Thank you. Keep being amazing!!  Thank you for allowing me to participate in your care.  I appreciate you. Kindest regards, Harlie Ragle

## 2021-04-01 NOTE — Patient Instructions (Signed)

## 2021-04-01 NOTE — Progress Notes (Signed)
BP 130/70    Pulse 88    Temp 98.1 F (36.7 C) (Oral)    Ht 5' 2.99" (1.6 m)    Wt 153 lb 12.8 oz (69.8 kg)    LMP  (LMP Unknown)    SpO2 97%    BMI 27.25 kg/m    Subjective:    Patient ID: Carol [name], female    DOB: August 07, 1950, 71 y.o.   MRN: [id number]  HPI: Carol [name] is a 71 y.o. female  Chief Complaint  Patient presents with   Arm Pain    Left upper arm pain has gotten just a little bit better.   ARM PAIN Seen in office by Dr. Neomia Dear on 03/17/21 for left arm pain.  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  Every now and then it hurts to lift arm up above head.  Hurts more after working.  She is taking Mobic as needed, which offers benefit. Duration: weeks Location: left lateral bicep area Mechanism of injury: unknown Onset: sudden Severity: 5/10  Quality:  dull, aching, and throbbing Frequency: intermittent Radiation: no Aggravating factors: movement -- lifting items Alleviating factors:  Mobic, Lidocaine patches, heat, and ice   Status: stable Treatments attempted: ice and heat , Mobic, Lidocaine patches, cream Relief with NSAIDs?:  moderate Swelling: no Redness: no  Warmth: no Trauma: no Chest pain: no  Shortness of breath: no  Fever: no Decreased sensation: no Paresthesias: no Weakness: no   Relevant past medical, surgical, family and social history reviewed and updated as indicated. Interim medical history since our last visit reviewed. Allergies and medications reviewed and updated.  Review of Systems  Constitutional:  Negative for activity change, appetite change, diaphoresis, fatigue and fever.  Respiratory:  Negative for cough, chest tightness and shortness of breath.   Cardiovascular:  Negative for chest pain, palpitations and leg swelling.  Gastrointestinal: Negative.   Musculoskeletal:  Positive for arthralgias.  Neurological: Negative.   Psychiatric/Behavioral: Negative.     Per HPI unless specifically indicated above      Objective:    BP 130/70    Pulse 88    Temp 98.1 F (36.7 C) (Oral)    Ht 5' 2.99" (1.6 m)    Wt 153 lb 12.8 oz (69.8 kg)    LMP  (LMP Unknown)    SpO2 97%    BMI 27.25 kg/m   Wt Readings from Last 3 Encounters:  04/01/21 153 lb 12.8 oz (69.8 kg)  03/17/21 152 lb 12.8 oz (69.3 kg)  02/03/21 153 lb 9.6 oz (69.7 kg)    Physical Exam Vitals and nursing note reviewed.  Constitutional:      General: She is awake. She is not in acute distress.    Appearance: She is well-developed and well-groomed. She is not ill-appearing or toxic-appearing.  HENT:     Head: Normocephalic.     Right Ear: Hearing normal.     Left Ear: Hearing normal.     Nose: Nose normal.  Eyes:     General: Lids are normal.        Right eye: No discharge.        Left eye: No discharge.     Conjunctiva/sclera: Conjunctivae normal.     Pupils: Pupils are equal, round, and reactive to light.  Neck:     Thyroid: No thyromegaly.     Vascular: No carotid bruit.  Cardiovascular:     Rate and Rhythm: Normal rate and  regular rhythm.     Heart sounds: Normal heart sounds. No murmur heard.   No gallop.  Pulmonary:     Effort: Pulmonary effort is normal. No accessory muscle usage or respiratory distress.     Breath sounds: Normal breath sounds.  Abdominal:     General: Bowel sounds are normal.     Palpations: Abdomen is soft. There is no hepatomegaly or splenomegaly.  Musculoskeletal:     Right shoulder: Normal.     Left shoulder: Tenderness present. No swelling, laceration, bony tenderness or crepitus. Normal range of motion. Decreased strength (with Neer and Hawkins testing).     Cervical back: Normal range of motion and neck supple.     Right lower leg: No edema.     Left lower leg: No edema.  Lymphadenopathy:     Cervical: No cervical adenopathy.  Skin:    General: Skin is warm and dry.  Neurological:     Mental Status: She is alert and oriented to person, place, and time.  Psychiatric:        Attention and  Perception: Attention normal.        Mood and Affect: Mood normal.        Speech: Speech normal.        Behavior: Behavior normal. Behavior is cooperative.        Thought Content: Thought content normal.      Shoulder: left    Inspection:  no swelling, ecchymosis, erythema or step off deformity.      Tenderness to Palpation:    Acromion: no    AC joint:no    Clavicle: no    Bicipital groove: yes    Scapular spine: no    Coracoid process: no    Humeral head: no    Supraspinatus tendon: no     Range of Motion:  Full Range of Motion bilaterally     Muscle Strength: 5/5 bilaterally    Flexion: Normal8    Extension: Normal    Abduction: Normal    Adduction: Normal    External rotation: Normal    Internal rotation: Normal     Neuro: Sensation WNL. and Upper extremity reflexes WNL.     Special Tests:     Neer sign: Positive    Hawkins sign: Positive    Cross arm adduction: Negative    Yergason sign: Negative    O'brien sign: Negative     Speed sign: Negative   Results for orders placed or performed in visit on 02/03/21  HgB A1c  Result Value Ref Range   Hgb A1c MFr Bld 5.9 (H) 4.8 - 5.6 %   Est. average glucose Bld gHb Est-mCnc 123 mg/dL  Comprehensive metabolic panel  Result Value Ref Range   Glucose 96 70 - 99 mg/dL   BUN 14 8 - 27 mg/dL   Creatinine, Ser 0.83 0.57 - 1.00 mg/dL   eGFR 76 >59 mL/min/1.73   BUN/Creatinine Ratio 17 12 - 28   Sodium 136 134 - 144 mmol/L   Potassium 4.4 3.5 - 5.2 mmol/L   Chloride 98 96 - 106 mmol/L   CO2 24 20 - 29 mmol/L   Calcium 9.4 8.7 - 10.3 mg/dL   Total Protein 7.5 6.0 - 8.5 g/dL   Albumin 4.2 3.8 - 4.8 g/dL   Globulin, Total 3.3 1.5 - 4.5 g/dL   Albumin/Globulin Ratio 1.3 1.2 - 2.2   Bilirubin Total 0.3 0.0 - 1.2 mg/dL   Alkaline Phosphatase 145 (H) 44 -  121 IU/L   AST 21 0 - 40 IU/L   ALT 15 0 - 32 IU/L  Lipid Panel w/o Chol/HDL Ratio  Result Value Ref Range   Cholesterol, Total 168 100 - 199 mg/dL   Triglycerides 91  0 - 149 mg/dL   HDL 47 >39 mg/dL   VLDL Cholesterol Cal 17 5 - 40 mg/dL   LDL Chol Calc (NIH) 104 (H) 0 - 99 mg/dL  TSH  Result Value Ref Range   TSH 3.990 0.450 - 4.500 uIU/mL  T4, free  Result Value Ref Range   Free T4 1.02 0.82 - 1.77 ng/dL  Microalbumin, Urine Waived  Result Value Ref Range   Microalb, Ur Waived 30 (H) 0 - 19 mg/L   Creatinine, Urine Waived 50 10 - 300 mg/dL   Microalb/Creat Ratio 30-300 (H) <30 mg/g  Gamma GT  Result Value Ref Range   GGT 20 0 - 60 IU/L      Assessment & Plan:   Problem List Items Addressed This Visit       Other   Chronic left shoulder pain - Primary    Ongoing now for several weeks, suspect some underlying arthritic changes + possible shoulder impingement.  Will obtain imaging to further assess shoulder.  Continue Mobic as needed + Lidocaine patches and cream OTC.  Recommend continued use of ice and heat as needed.  Return to office in 3 weeks for follow-up, sooner if worsening. Will consider PT or ortho referral if ongoing.      Relevant Orders   DG Shoulder Left     Follow up plan: Return in about 3 weeks (around 04/22/2021) for Shoulder pain.

## 2021-04-02 ENCOUNTER — Telehealth: Payer: Self-pay

## 2021-04-02 DIAGNOSIS — G8929 Other chronic pain: Secondary | ICD-10-CM

## 2021-04-02 DIAGNOSIS — M25512 Pain in left shoulder: Secondary | ICD-10-CM

## 2021-04-02 NOTE — Telephone Encounter (Signed)
-----   Message from Marjie Skiff, NP sent at 04/01/2021  7:26 PM EST ----- Good morning crew, please let Ludmilla know her imaging has returned, does noticed some mild arthritic changes.  There also have been noted to be a possible cyst to joint area or rotator cuff issues, I would recommend referral to ortho for further assessment of area.  Would you be okay with this?  If so let me know and I will place referral.  Thank you. Keep being amazing!!  Thank you for allowing me to participate in your care.  I appreciate you. Kindest regards, Jolene

## 2021-04-02 NOTE — Telephone Encounter (Signed)
Patient made aware of results and verbalized understanding.   Patient would like to know if you recommend that she continue to work.   Referral t'd up

## 2021-04-28 ENCOUNTER — Ambulatory Visit: Payer: Medicare Other | Admitting: Nurse Practitioner

## 2021-04-28 DIAGNOSIS — M1711 Unilateral primary osteoarthritis, right knee: Secondary | ICD-10-CM | POA: Diagnosis not present

## 2021-04-28 DIAGNOSIS — Z96641 Presence of right artificial hip joint: Secondary | ICD-10-CM | POA: Diagnosis not present

## 2021-04-28 DIAGNOSIS — M7542 Impingement syndrome of left shoulder: Secondary | ICD-10-CM | POA: Diagnosis not present

## 2021-04-28 DIAGNOSIS — Z96642 Presence of left artificial hip joint: Secondary | ICD-10-CM | POA: Diagnosis not present

## 2021-04-28 DIAGNOSIS — Z96643 Presence of artificial hip joint, bilateral: Secondary | ICD-10-CM | POA: Diagnosis not present

## 2021-05-01 NOTE — Patient Instructions (Incomplete)

## 2021-05-04 ENCOUNTER — Ambulatory Visit: Payer: Medicare Other | Admitting: Nurse Practitioner

## 2021-05-07 ENCOUNTER — Encounter: Payer: Self-pay | Admitting: Nurse Practitioner

## 2021-05-07 ENCOUNTER — Other Ambulatory Visit: Payer: Self-pay

## 2021-05-07 ENCOUNTER — Ambulatory Visit (INDEPENDENT_AMBULATORY_CARE_PROVIDER_SITE_OTHER): Payer: Medicare Other | Admitting: Nurse Practitioner

## 2021-05-07 VITALS — BP 128/80 | HR 76 | Temp 97.6°F | Resp 18 | Ht 62.99 in | Wt 153.0 lb

## 2021-05-07 DIAGNOSIS — R801 Persistent proteinuria, unspecified: Secondary | ICD-10-CM

## 2021-05-07 DIAGNOSIS — R7309 Other abnormal glucose: Secondary | ICD-10-CM | POA: Diagnosis not present

## 2021-05-07 DIAGNOSIS — Z1211 Encounter for screening for malignant neoplasm of colon: Secondary | ICD-10-CM

## 2021-05-07 DIAGNOSIS — I10 Essential (primary) hypertension: Secondary | ICD-10-CM

## 2021-05-07 DIAGNOSIS — E782 Mixed hyperlipidemia: Secondary | ICD-10-CM

## 2021-05-07 DIAGNOSIS — R748 Abnormal levels of other serum enzymes: Secondary | ICD-10-CM | POA: Diagnosis not present

## 2021-05-07 DIAGNOSIS — R7989 Other specified abnormal findings of blood chemistry: Secondary | ICD-10-CM | POA: Diagnosis not present

## 2021-05-07 NOTE — Assessment & Plan Note (Signed)
Chronic, ongoing.  Continues every other day statin which has decreased side effects.  Lipid panel and CMP next visit.  Adjust dose as needed. ?

## 2021-05-07 NOTE — Assessment & Plan Note (Signed)
Recheck A1C next visit and change plan of care as needed.  Consider medication if needed in future. ?

## 2021-05-07 NOTE — Progress Notes (Signed)
? ?BP 128/80 (BP Location: Left Arm, Patient Position: Sitting, Cuff Size: Normal) Comment: manual  Pulse 76 Comment: apical  Temp 97.6 ?F (36.4 ?C) (Oral)   Resp 18   Ht 5' 2.99" (1.6 m)   Wt 153 lb (69.4 kg)   LMP  (LMP Unknown)   SpO2 97%   BMI 27.11 kg/m?   ? ?Subjective:  ? ? Patient ID: Carol [name], female    DOB: [birth year], 71 y.o.   MRN: [id number] ? ?HPI: ?Carol [name] is a 71 y.o. female ? ?Chief Complaint  ?Patient presents with  ? Hyperlipidemia  ? Hypertension  ? Elevated TSH  ? A1c Check  ? ?HYPERTENSION / HYPERLIPIDEMIA ?Continues on Losartan 25 MG daily for HTN and proteinuria and Lipitor every other day. Remains on ARB due to proteinuria.  Noted to have elevation in alkaline phosphatase past labs, recent 145, and normal GGT.  Still has gall bladder. ? ?History of elevation in A1C, no current symptoms and focused on diet -- recent A1c in December 2022 = 5.9%. ?Satisfied with current treatment? yes ?Duration of hypertension: chronic ?BP monitoring frequency: not checking ?BP range: ?BP medication side effects: no ?Duration of hyperlipidemia: chronic ?Cholesterol medication side effects: no ?Cholesterol supplements: none ?Medication compliance: good compliance ?Aspirin: yes ?Recent stressors: no ?Recurrent headaches: no ?Visual changes: no ?Palpitations: no ?Dyspnea: no ?Chest pain: no ?Lower extremity edema: no ?Dizzy/lightheaded: no ? ?THYROID ISSUES ?Has history of elevation in TSH, but recent levels have remained normal.  Thyroid ultrasound on 10/30/19 -- multinodular goiter noted -- ordered by ENT -- thyroid biopsy which was normal.   ?Fatigue: no ?Cold intolerance: no ?Heat intolerance: no ?Weight gain: no ?Weight loss: no ?Constipation: no ?Diarrhea/loose stools: no ?Palpitations: no ?Lower extremity edema: no ?Anxiety/depressed mood: no  ? ?Relevant past medical, surgical, family and social history reviewed and updated as indicated. Interim medical history since our last visit  reviewed. ?Allergies and medications reviewed and updated. ? ?Review of Systems  ?Constitutional:  Negative for activity change, appetite change, diaphoresis, fatigue and fever.  ?Respiratory:  Negative for cough, chest tightness and shortness of breath.   ?Cardiovascular:  Negative for chest pain, palpitations and leg swelling.  ?Gastrointestinal: Negative.   ?Neurological: Negative.   ?Psychiatric/Behavioral: Negative.    ? ?Per HPI unless specifically indicated above ? ?   ?Objective:  ?  ?BP 128/80 (BP Location: Left Arm, Patient Position: Sitting, Cuff Size: Normal) Comment: manual  Pulse 76 Comment: apical  Temp 97.6 ?F (36.4 ?C) (Oral)   Resp 18   Ht 5' 2.99" (1.6 m)   Wt 153 lb (69.4 kg)   LMP  (LMP Unknown)   SpO2 97%   BMI 27.11 kg/m?   ?Wt Readings from Last 3 Encounters:  ?05/07/21 153 lb (69.4 kg)  ?04/01/21 153 lb 12.8 oz (69.8 kg)  ?03/17/21 152 lb 12.8 oz (69.3 kg)  ?  ?Physical Exam ?Vitals and nursing note reviewed.  ?Constitutional:   ?   General: She is awake. She is not in acute distress. ?   Appearance: She is well-developed and overweight. She is not ill-appearing.  ?HENT:  ?   Head: Normocephalic.  ?   Right Ear: Hearing, tympanic membrane, ear canal and external ear normal.  ?   Left Ear: Hearing, tympanic membrane, ear canal and external ear normal.  ?Eyes:  ?   General: Lids are normal.     ?   Right eye: No discharge.     ?  Left eye: No discharge.  ?   Conjunctiva/sclera: Conjunctivae normal.  ?   Pupils: Pupils are equal, round, and reactive to light.  ?Neck:  ?   Vascular: No carotid bruit.  ?Cardiovascular:  ?   Rate and Rhythm: Normal rate and regular rhythm.  ?   Heart sounds: Normal heart sounds. No murmur heard. ?  No gallop.  ?Pulmonary:  ?   Effort: Pulmonary effort is normal. No accessory muscle usage or respiratory distress.  ?   Breath sounds: Normal breath sounds.  ?Abdominal:  ?   General: Bowel sounds are normal.  ?   Palpations: Abdomen is soft.   ?Musculoskeletal:  ?   Cervical back: Normal range of motion and neck supple.  ?   Right lower leg: No edema.  ?   Left lower leg: No edema.  ?Lymphadenopathy:  ?   Head:  ?   Right side of head: No submental, submandibular, tonsillar, preauricular or posterior auricular adenopathy.  ?   Left side of head: No submental, submandibular, tonsillar, preauricular or posterior auricular adenopathy.  ?Skin: ?   General: Skin is warm and dry.  ?Neurological:  ?   Mental Status: She is alert and oriented to person, place, and time.  ?Psychiatric:     ?   Attention and Perception: Attention normal.     ?   Mood and Affect: Mood normal.     ?   Speech: Speech normal.     ?   Behavior: Behavior normal. Behavior is cooperative.     ?   Thought Content: Thought content normal.  ? ? ?Results for orders placed or performed in visit on 02/03/21  ?HgB A1c  ?Result Value Ref Range  ? Hgb A1c MFr Bld 5.9 (H) 4.8 - 5.6 %  ? Est. average glucose Bld gHb Est-mCnc 123 mg/dL  ?Comprehensive metabolic panel  ?Result Value Ref Range  ? Glucose 96 70 - 99 mg/dL  ? BUN 14 8 - 27 mg/dL  ? Creatinine, Ser 0.83 0.57 - 1.00 mg/dL  ? eGFR 76 >59 mL/min/1.73  ? BUN/Creatinine Ratio 17 12 - 28  ? Sodium 136 134 - 144 mmol/L  ? Potassium 4.4 3.5 - 5.2 mmol/L  ? Chloride 98 96 - 106 mmol/L  ? CO2 24 20 - 29 mmol/L  ? Calcium 9.4 8.7 - 10.3 mg/dL  ? Total Protein 7.5 6.0 - 8.5 g/dL  ? Albumin 4.2 3.8 - 4.8 g/dL  ? Globulin, Total 3.3 1.5 - 4.5 g/dL  ? Albumin/Globulin Ratio 1.3 1.2 - 2.2  ? Bilirubin Total 0.3 0.0 - 1.2 mg/dL  ? Alkaline Phosphatase 145 (H) 44 - 121 IU/L  ? AST 21 0 - 40 IU/L  ? ALT 15 0 - 32 IU/L  ?Lipid Panel w/o Chol/HDL Ratio  ?Result Value Ref Range  ? Cholesterol, Total 168 100 - 199 mg/dL  ? Triglycerides 91 0 - 149 mg/dL  ? HDL 47 >39 mg/dL  ? VLDL Cholesterol Cal 17 5 - 40 mg/dL  ? LDL Chol Calc (NIH) 104 (H) 0 - 99 mg/dL  ?TSH  ?Result Value Ref Range  ? TSH 3.990 0.450 - 4.500 uIU/mL  ?T4, free  ?Result Value Ref Range  ?  Free T4 1.02 0.82 - 1.77 ng/dL  ?Microalbumin, Urine Waived  ?Result Value Ref Range  ? Microalb, Ur Waived 30 (H) 0 - 19 mg/L  ? Creatinine, Urine Waived 50 10 - 300 mg/dL  ? Microalb/Creat Ratio 30-300 (H) <  30 mg/g  ?Gamma GT  ?Result Value Ref Range  ? GGT 20 0 - 60 IU/L  ? ?   ?Assessment & Plan:  ? ?Problem List Items Addressed This Visit   ? ?  ? Cardiovascular and Mediastinum  ? Hypertension - Primary  ?  Chronic, ongoing. BP in office today below goal.  Recommend she monitor at least a few times a week at home.  Continue Losartan 25 MG daily and adjust as needed -- she did not tolerate increase in this dose to 50 MG in past and had dizziness.   Focus on DASH diet at home.  Return to office in 3 months for BP check and reassurance.  Labs next visit: CMP, TSH, Lipid, urine ALB. ?  ?  ?  ? Other  ? Alkaline phosphatase elevation  ?  Recheck CMP and GGT next visit -- monitor. ?  ?  ? Elevated hemoglobin A1c  ?  Recheck A1C next visit and change plan of care as needed.  Consider medication if needed in future. ?  ?  ? Elevated TSH  ?  Recheck TSH and Free T4 next visit, no current symptoms -- recent labs stable.  Reviewed biopsy and ENT notes. ?  ?  ? Hyperlipidemia  ?  Chronic, ongoing.  Continues every other day statin which has decreased side effects.  Lipid panel and CMP next visit.  Adjust dose as needed. ?  ?  ? Proteinuria  ?  Continue Losartan for kidney protection and recheck urine and CMP next visit. ?  ?  ? ?Other Visit Diagnoses   ? ? Colon cancer screening      ? FOBT ordered.  ? Relevant Orders  ? Fecal occult blood, imunochemical  ? ?  ?  ? ?Follow up plan: ?Return in about 3 months (around 08/07/2021) for Prediabetes, HTN/HLD, THYROID, PROTEINURIA --- labs.  ?

## 2021-05-07 NOTE — Assessment & Plan Note (Signed)
Chronic, ongoing. BP in office today below goal.  Recommend she monitor at least a few times a week at home.  Continue Losartan 25 MG daily and adjust as needed -- she did not tolerate increase in this dose to 50 MG in past and had dizziness.   Focus on DASH diet at home.  Return to office in 3 months for BP check and reassurance.  Labs next visit: CMP, TSH, Lipid, urine ALB. ?

## 2021-05-07 NOTE — Assessment & Plan Note (Signed)
Recheck TSH and Free T4 next visit, no current symptoms -- recent labs stable.  Reviewed biopsy and ENT notes. °

## 2021-05-07 NOTE — Patient Instructions (Signed)
Prediabetes Eating Plan °Prediabetes is a condition that causes blood sugar (glucose) levels to be higher than normal. This increases the risk for developing type 2 diabetes (type 2 diabetes mellitus). Working with a health care provider or nutrition specialist (dietitian) to make diet and lifestyle changes can help prevent the onset of diabetes. These changes may help you: °Control your blood glucose levels. °Improve your cholesterol levels. °Manage your blood pressure. °What are tips for following this plan? °Reading food labels °Read food labels to check the amount of fat, salt (sodium), and sugar in prepackaged foods. Avoid foods that have: °Saturated fats. °Trans fats. °Added sugars. °Avoid foods that have more than 300 milligrams (mg) of sodium per serving. Limit your sodium intake to less than 2,300 mg each day. °Shopping °Avoid buying pre-made and processed foods. °Avoid buying drinks with added sugar. °Cooking °Cook with olive oil. Do not use butter, lard, or ghee. °Bake, broil, grill, steam, or boil foods. Avoid frying. °Meal planning ° °Work with your dietitian to create an eating plan that is right for you. This may include tracking how many calories you take in each day. Use a food diary, notebook, or mobile application to track what you eat at each meal. °Consider following a Mediterranean diet. This includes: °Eating several servings of fresh fruits and vegetables each day. °Eating fish at least twice a week. °Eating one serving each day of whole grains, beans, nuts, and seeds. °Using olive oil instead of other fats. °Limiting alcohol. °Limiting red meat. °Using nonfat or low-fat dairy products. °Consider following a plant-based diet. This includes dietary choices that focus on eating mostly vegetables and fruit, grains, beans, nuts, and seeds. °If you have high blood pressure, you may need to limit your sodium intake or follow a diet such as the DASH (Dietary Approaches to Stop Hypertension) eating  plan. The DASH diet aims to lower high blood pressure. °Lifestyle °Set weight loss goals with help from your health care team. It is recommended that most people with prediabetes lose 7% of their body weight. °Exercise for at least 30 minutes 5 or more days a week. °Attend a support group or seek support from a mental health counselor. °Take over-the-counter and prescription medicines only as told by your health care provider. °What foods are recommended? °Fruits °Berries. Bananas. Apples. Oranges. Grapes. Papaya. Mango. Pomegranate. Kiwi. Grapefruit. Cherries. °Vegetables °Lettuce. Spinach. Peas. Beets. Cauliflower. Cabbage. Broccoli. Carrots. Tomatoes. Squash. Eggplant. Herbs. Peppers. Onions. Cucumbers. Brussels sprouts. °Grains °Whole grains, such as whole-wheat or whole-grain breads, crackers, cereals, and pasta. Unsweetened oatmeal. Bulgur. Barley. Quinoa. Brown rice. Corn or whole-wheat flour tortillas or taco shells. °Meats and other proteins °Seafood. Poultry without skin. Lean cuts of pork and beef. Tofu. Eggs. Nuts. Beans. °Dairy °Low-fat or fat-free dairy products, such as yogurt, cottage cheese, and cheese. °Beverages °Water. Tea. Coffee. Sugar-free or diet soda. Seltzer water. Low-fat or nonfat milk. Milk alternatives, such as soy or almond milk. °Fats and oils °Olive oil. Canola oil. Sunflower oil. Grapeseed oil. Avocado. Walnuts. °Sweets and desserts °Sugar-free or low-fat pudding. Sugar-free or low-fat ice cream and other frozen treats. °Seasonings and condiments °Herbs. Sodium-free spices. Mustard. Relish. Low-salt, low-sugar ketchup. Low-salt, low-sugar barbecue sauce. Low-fat or fat-free mayonnaise. °The items listed above may not be a complete list of recommended foods and beverages. Contact a dietitian for more information. °What foods are not recommended? °Fruits °Fruits canned with syrup. °Vegetables °Canned vegetables. Frozen vegetables with butter or cream sauce. °Grains °Refined white  flour and flour   products, such as bread, pasta, snack foods, and cereals. °Meats and other proteins °Fatty cuts of meat. Poultry with skin. Breaded or fried meat. Processed meats. °Dairy °Full-fat yogurt, cheese, or milk. °Beverages °Sweetened drinks, such as iced tea and soda. °Fats and oils °Butter. Lard. Ghee. °Sweets and desserts °Baked goods, such as cake, cupcakes, pastries, cookies, and cheesecake. °Seasonings and condiments °Spice mixes with added salt. Ketchup. Barbecue sauce. Mayonnaise. °The items listed above may not be a complete list of foods and beverages that are not recommended. Contact a dietitian for more information. °Where to find more information °American Diabetes Association: www.diabetes.org °Summary °You may need to make diet and lifestyle changes to help prevent the onset of diabetes. These changes can help you control blood sugar, improve cholesterol levels, and manage blood pressure. °Set weight loss goals with help from your health care team. It is recommended that most people with prediabetes lose 7% of their body weight. °Consider following a Mediterranean diet. This includes eating plenty of fresh fruits and vegetables, whole grains, beans, nuts, seeds, fish, and low-fat dairy, and using olive oil instead of other fats. °This information is not intended to replace advice given to you by your health care provider. Make sure you discuss any questions you have with your health care provider. °Document Revised: 05/03/2019 Document Reviewed: 05/03/2019 °Elsevier Patient Education © 2022 [employer] ° °

## 2021-05-07 NOTE — Assessment & Plan Note (Signed)
Recheck CMP and GGT next visit -- monitor. ?

## 2021-05-07 NOTE — Assessment & Plan Note (Signed)
Continue Losartan for kidney protection and recheck urine and CMP next visit. 

## 2021-05-12 ENCOUNTER — Ambulatory Visit (INDEPENDENT_AMBULATORY_CARE_PROVIDER_SITE_OTHER): Payer: Medicare Other

## 2021-05-12 ENCOUNTER — Telehealth: Payer: Medicare Other

## 2021-05-12 DIAGNOSIS — I10 Essential (primary) hypertension: Secondary | ICD-10-CM

## 2021-05-12 DIAGNOSIS — E782 Mixed hyperlipidemia: Secondary | ICD-10-CM

## 2021-05-12 DIAGNOSIS — M1990 Unspecified osteoarthritis, unspecified site: Secondary | ICD-10-CM

## 2021-05-12 DIAGNOSIS — G8929 Other chronic pain: Secondary | ICD-10-CM

## 2021-05-12 NOTE — Patient Instructions (Signed)
Visit Information ? ?Thank you for taking time to visit with me today. Please don't hesitate to contact me if I can be of assistance to you before our next scheduled telephone appointment. ? ?Following are the goals we discussed today:  ?RNCM Clinical Goal(s):  ?Patient will verbalize understanding of plan for management of HTN, HLD, and Osteoarthritis as evidenced by following the plan of care and working with the CCM team and pcp to optimize health and well being  ?take all medications exactly as prescribed and will call provider for medication related questions as evidenced by taking medications as directed and calling for refills before running out of medications     ?attend all scheduled medical appointments: 08-07-2021 at 0940 am with pcp  as evidenced by keeping appointments and calling for schedule changes        ?demonstrate improved and ongoing health management independence as evidenced by taking medications as directed, following a heart healthy diet, and preventive care and maintaining of health and well being        ?demonstrate a decrease in HTN, HLD, and Osteoarthritis exacerbations  as evidenced by working with the CCM team to optimize health and well being and effectively manage chronic condtions  ?demonstrate ongoing self health care management ability for effective management of chronic conditions  as evidenced by working with the CCM team through collaboration with Consulting civil engineer, provider, and care team.  ?  ?Interventions: ?1:1 collaboration with primary care provider regarding development and update of comprehensive plan of care as evidenced by provider attestation and co-signature ?Inter-disciplinary care team collaboration (see longitudinal plan of care) ?Evaluation of current treatment plan related to  self management and patient's adherence to plan as established by provider ?  ?  ?Hyperlipidemia:  (Status: Goal on Track (progressing): YES.) Long Term Goal  ?     ?Lab Results  ?Component  Value Date  ?  CHOL 168 02/03/2021  ?  HDL 47 02/03/2021  ?  [place] 104 (H) 02/03/2021  ?  TRIG 91 02/03/2021  ?  ?  ?Medication review performed; medication list updated in electronic medical record. 05-12-2021: The patient is compliant with medications. The patient denies any issues with medications compliance. The patient takes Lipitor 10 mg QD ?Provider established cholesterol goals reviewed. 03-03-2021: Review of goal of LDL. The patient is just over goal for LDL range. Education on watching for dietary restrictions.  ?Counseled on importance of regular laboratory monitoring as prescribed. 05-12-2021: Has regular labwork and testing; ?Provided HLD educational materials; ?Reviewed role and benefits of statin for ASCVD risk reduction; ?Discussed strategies to manage statin-induced myalgias; ?Reviewed importance of limiting foods high in cholesterol. 05-12-2021: Education on monitoring foods high in cholesterol. Patient states she is eating well.  ?Screening for signs and symptoms of depression related to chronic disease state;  ?Assessed social determinant of health barriers;  ?  ?Hypertension: (Status: Goal on Track (progressing): YES.) ?Last practice recorded BP readings:  ?   ?BP Readings from Last 3 Encounters:  ?05/07/21 128/80  ?04/01/21 130/70  ?03/17/21 136/60  ?Most recent eGFR/CrCl:  ?     ?Lab Results  ?Component Value Date  ?  EGFR 76 02/03/2021  ?   ?  ?Evaluation of current treatment plan related to hypertension self management and patient's adherence to plan as established by provider. 05-12-2021: The patient is at goal for blood pressures. Denies any issues with HTN or heart health. Will continue to monitor. Feels she is doing well on the  current regimen she is on. Denies any light headedness or dizziness;   ?Provided education to patient re: stroke prevention, s/s of heart attack and stroke; ?Reviewed prescribed diet heart healthy  ?Reviewed medications with patient and discussed importance of  compliance. 05-12-2021: Denies any issues with medications compliance, states she takes her medications as ordered;  ?Discussed plans with patient for ongoing care management follow up and provided patient with direct contact information for care management team; ?Advised patient, providing education and rationale, to monitor blood pressure daily and record, calling PCP for findings outside established parameters. 12-31-2020: The patient does not like to take her blood pressures at home, she states that it makes her nervous and upset especially if it is up. The patient denies headaches, states she is sleeping well. Her daughter and granddaughter live with her and she is thankful for this. She is working a part time job at [employer] and is happy about the income to help pay her bills. She denies any acute findings today.  03-03-2021: The patient states she gets upset when she checks her blood pressures at home. She is having some hip and knee pain but cannot get in to see the specialist until March. The patient is working part time and is still very active. Will continue to monitor. 05-12-2021: The patient feels she does not need to take her blood pressures at home. 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  ?Advised patient to discuss blood pressure trends with provider; ?Provided education on prescribed diet heart healthy;  ?Discussed complications of poorly controlled blood pressure such as heart disease, stroke, circulatory complications, vision complications, kidney impairment, sexual dysfunction;  ?  ?Pain:  (Status: Goal on Track (progressing): YES.) Long Term Goal  ?Pain assessment performed. 12-31-2020: The patient states that she has some arthritis pain but she rest when she needs to. States she is only working part time and will continue to do so for now. She denies any pain today but  knows her limits. She is excited about next week cooking and being with her family. 03-03-2021: The patient is having pain in her right knee and hip. Rates it at a 6 today on a scale of 0-10. The patient states she cannot get into see Dr. Jalene Mullet until March. He is the one who did her surgeries. She states that she is being careful and denies any acute issues. Discussed being seen at [facility] for unresolved pain or intense changes in pain. The patient states she feels like she can wait until March to see the specialist. Will continue to monitor for changes. 05-12-2021: The patient denies any pain in her shoulder or knee today. They mainly hurt when she is working. She will follow up with the specialist who is monitoring her shoulder and her knee on 05-26-2021 for further evaluation and treatment options.  ?Medications reviewed. 05-12-2021: Is compliant with medications and recently had an injection in her left shoulder about 2.5 weeks ago. She states this has been helpful but when she is working it aggravates the area. 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  ?Reviewed provider established plan for pain management. 05-12-2021: Seeing specialist for management of pain. She  states he gave her some exercises to do last week and this was helpful. He also is discussing her working with PT in the future. She follows up with the specialist on 05-26-2021. ?Discussed importance of adherence to all scheduled medical appointments.Specialist on 05-26-2021 and pcp on 08-07-2021 ?Counseled on the importance of reporting any/all new or changed pain symptoms or management strategies to pain management provider; ?Advised patient to report to care team affect of pain on daily activities; ?Discussed use of relaxation techniques and/or diversional activities to assist with pain reduction (distraction,  imagery, relaxation, massage, acupressure, TENS, heat, and cold application; ?Reviewed with patient prescribed pharmacological and nonpharmacological pain relief strategies; ?Advised patient to discuss unresolv

## 2021-05-12 NOTE — Chronic Care Management (AMB) (Signed)
?Chronic Care Management  ? ?CCM RN Visit Note ? ?05/12/2021 ?Name: Carol [name] MRN: [id number] DOB: [birth year] ? ?Subjective: ?Carol [name] is a 71 y.o. year old female who is a primary care patient of Cannady, Barbaraann Faster, NP. The care management team was consulted for assistance with disease management and care coordination needs.   ? ?Engaged with patient by telephone for follow up visit in response to provider referral for case management and/or care coordination services.  ? ?Consent to Services:  ?The patient was given information about Chronic Care Management services, agreed to services, and gave verbal consent prior to initiation of services.  Please see initial visit note for detailed documentation.  ? ?Patient agreed to services and verbal consent obtained.  ? ?Assessment: Review of patient past medical history, allergies, medications, health status, including review of consultants reports, laboratory and other test data, was performed as part of comprehensive evaluation and provision of chronic care management services.  ? ?SDOH (Social Determinants of Health) assessments and interventions performed:   ? ?CCM Care Plan ? ?No Known Allergies ? ?Outpatient Encounter Medications as of 05/12/2021  ?Medication Sig  ? aspirin 81 MG tablet Take 81 mg by mouth daily.  ? atorvastatin (LIPITOR) 10 MG tablet TAKE 1 TABLET BY MOUTH EVERY OTHER DAY  ? diclofenac Sodium (VOLTAREN) 1 % GEL Apply 2 g topically 4 (four) times daily.  ? fluticasone (FLONASE) 50 MCG/ACT nasal spray SPRAY 2 SPRAYS INTO EACH NOSTRIL ONCE A DAY  ? lidocaine (LIDODERM) 5 % Place 1 patch onto the skin daily. Remove & Discard patch within 12 hours or as directed by MD  ? loratadine (CLARITIN) 10 MG tablet Take 10 mg by mouth daily.  ? losartan (COZAAR) 50 MG tablet TAKE 1/2 TABLET (25 MG TOTAL) BY MOUTH DAILY  ? meclizine (ANTIVERT) 25 MG tablet Take 1 tablet (25 mg total) by mouth 3 (three) times daily as needed for dizziness.  ? meloxicam  (MOBIC) 15 MG tablet Take 1 tablet (15 mg total) by mouth daily.  ? triamcinolone (KENALOG) 0.1 % Apply 1 application topically 2 (two) times daily.  ? ?No facility-administered encounter medications on file as of 05/12/2021.  ? ? ?Patient Active Problem List  ? Diagnosis Date Noted  ? Chronic left shoulder pain 04/01/2021  ? Chronic pain of right knee 01/28/2020  ? Proteinuria 09/26/2018  ? Elevated hemoglobin A1c 04/14/2018  ? Elevated TSH 12/26/2017  ? Ganglion of left wrist 12/14/2017  ? Hyperlipidemia 09/13/2017  ? Onychomycosis 02/01/2017  ? Advanced care planning/counseling discussion 02/01/2017  ? Hypertension 01/13/2015  ? Allergic rhinitis 01/13/2015  ? Osteoarthritis 01/13/2015  ? Alkaline phosphatase elevation 01/13/2015  ? Eczema 01/13/2015  ? 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 ? ?Conditions to be addressed/monitored:HTN, HLD, and Osteoarthritis ? ?Care Plan : RNCM: General Plan of Care (Adult) for Chronic Disease Management and Care Coordination Needs  ?Updates made by Vanita Ingles, RN since 05/12/2021 12:00 AM  ?  ? ?Problem: RNCM: Development of Plan of Care for Chronic Disease (HTN, HLD, OA)   ?Priority: High  ?  ? ?Long-Range Goal: RNCM: Effective Management  of Plan of Care for Chronic Disease (HTN, HLD, OA)   ?Start Date: 12/31/2020  ?Expected End Date: 12/31/2021  ?Priority: High  ?Note:   ?Current Barriers:  ?Knowledge Deficits related to plan of care for management of HTN, HLD, and chronic pain   ?Chronic Disease Management support and education needs related to  HTN, HLD, and chronic pain  ? ?RNCM Clinical Goal(s):  ?Patient will verbalize understanding of plan for management of HTN, HLD, and Osteoarthritis as evidenced by following the plan of care and working with the CCM team and pcp to optimize health and well being  ?take all medications exactly as prescribed and will call provider for medication related questions as evidenced by taking medications as directed  and calling for refills before running out of medications     ?attend all scheduled medical appointments: 08-07-2021 at 0940 am with pcp  as evidenced by keeping appointments and calling for schedule changes        ?demonstrate improved and ongoing health management independence as evidenced by taking medications as directed, following a heart healthy diet, and preventive care and maintaining of health and well being        ?demonstrate a decrease in HTN, HLD, and Osteoarthritis exacerbations  as evidenced by working with the CCM team to optimize health and well being and effectively manage chronic condtions  ?demonstrate ongoing self health care management ability for effective management of chronic conditions  as evidenced by working with the CCM team through collaboration with RN Care manager, provider, and care team.  ? ?Interventions: ?1:1 collaboration with primary care provider regarding development and update of comprehensive plan of care as evidenced by provider attestation and co-signature ?Inter-disciplinary care team collaboration (see longitudinal plan of care) ?Evaluation of current treatment plan related to  self management and patient's adherence to plan as established by provider ? ? ?Hyperlipidemia:  (Status: Goal on Track (progressing): YES.) Long Term Goal  ?Lab Results  ?Component Value Date  ? CHOL 168 02/03/2021  ? HDL 47 02/03/2021  ? LDLCALC 104 (H) 02/03/2021  ? TRIG 91 02/03/2021  ?  ? ?Medication review performed; medication list updated in electronic medical record. 05-12-2021: The patient is compliant with medications. The patient denies any issues with medications compliance. The patient takes Lipitor 10 mg QD ?Provider established cholesterol goals reviewed. 03-03-2021: Review of goal of LDL. The patient is just over goal for LDL range. Education on watching for dietary restrictions.  ?Counseled on importance of regular laboratory monitoring as prescribed. 05-12-2021: Has regular labwork  and testing; ?Provided HLD educational materials; ?Reviewed role and benefits of statin for ASCVD risk reduction; ?Discussed strategies to manage statin-induced myalgias; ?Reviewed importance of limiting foods high in cholesterol. 05-12-2021: Education on monitoring foods high in cholesterol. Patient states she is eating well.  ?Screening for signs and symptoms of depression related to chronic disease state;  ?Assessed social determinant of health barriers;  ? ?Hypertension: (Status: Goal on Track (progressing): YES.) ?Last practice recorded BP readings:  ?BP Readings from Last 3 Encounters:  ?05/07/21 128/80  ?04/01/21 130/70  ?03/17/21 136/60  ?Most recent eGFR/CrCl:  ?Lab Results  ?Component Value Date  ? EGFR 76 02/03/2021  ?   ? ?Evaluation of current treatment plan related to hypertension self management and patient's adherence to plan as established by provider. 05-12-2021: The patient is at goal for blood pressures. Denies any issues with HTN or heart health. Will continue to monitor. Feels she is doing well on the current regimen she is on. Denies any light headedness or dizziness;   ?Provided education to patient re: stroke prevention, s/s of heart attack and stroke; ?Reviewed prescribed diet heart healthy  ?Reviewed medications with patient and discussed importance of compliance. 05-12-2021: Denies any issues with medications compliance, states she takes her medications as ordered;  ?Discussed plans   with patient for ongoing care management follow up and provided patient with direct contact information for care management team; ?Advised patient, providing education and rationale, to monitor blood pressure daily and record, calling PCP for findings outside established parameters. 12-31-2020: The patient does not like to take her blood pressures at home, she states that it makes her nervous and upset especially if it is up. The patient denies headaches, states she is sleeping well. Her daughter and  granddaughter live with her and she is thankful for this. 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 She denies any acute findings today.  03-03-2021: The patient

## 2021-05-15 DIAGNOSIS — E785 Hyperlipidemia, unspecified: Secondary | ICD-10-CM

## 2021-05-15 DIAGNOSIS — I10 Essential (primary) hypertension: Secondary | ICD-10-CM

## 2021-05-20 DIAGNOSIS — Z1211 Encounter for screening for malignant neoplasm of colon: Secondary | ICD-10-CM | POA: Diagnosis not present

## 2021-05-22 LAB — FECAL OCCULT BLOOD, IMMUNOCHEMICAL: Fecal Occult Bld: NEGATIVE

## 2021-05-22 NOTE — Progress Notes (Signed)
Please let Carol [name] know her stool testing returned negative for blood, great news.  We will repeat in one year:)  Have a great day!!

## 2021-05-26 DIAGNOSIS — M7542 Impingement syndrome of left shoulder: Secondary | ICD-10-CM | POA: Diagnosis not present

## 2021-07-14 ENCOUNTER — Ambulatory Visit (INDEPENDENT_AMBULATORY_CARE_PROVIDER_SITE_OTHER): Payer: Medicare Other

## 2021-07-14 ENCOUNTER — Telehealth: Payer: Medicare Other

## 2021-07-14 DIAGNOSIS — M1990 Unspecified osteoarthritis, unspecified site: Secondary | ICD-10-CM

## 2021-07-14 DIAGNOSIS — I10 Essential (primary) hypertension: Secondary | ICD-10-CM

## 2021-07-14 DIAGNOSIS — E782 Mixed hyperlipidemia: Secondary | ICD-10-CM

## 2021-07-14 DIAGNOSIS — G8929 Other chronic pain: Secondary | ICD-10-CM

## 2021-07-14 NOTE — Patient Instructions (Signed)
Visit Information  Thank you for taking time to visit with me today. Please don't hesitate to contact me if I can be of assistance to you before our next scheduled telephone appointment.  Following are the goals we discussed today:  Hyperlipidemia:  (Status: Goal on Track (progressing): YES.) Long Term Goal       Lab Results  Component Value Date    CHOL 168 02/03/2021    HDL 47 02/03/2021    LDLCALC 104 (H) 02/03/2021    TRIG 91 02/03/2021  07-14-2021: Scheduled to have new blood work in June 2023    Medication review performed; medication list updated in electronic medical record. 07-14-2021: The patient is compliant with medications. The patient denies any issues with medications compliance. The patient takes Lipitor 10 mg QD. Denies any medication needs at this time. States she is taking as directed.  Provider established cholesterol goals reviewed. 03-03-2021: Review of goal of LDL. The patient is just over goal for LDL range. Education on watching for dietary restrictions.  Counseled on importance of regular laboratory monitoring as prescribed. 07-14-2021: Has regular labwork and testing. Likely having new lab work at visit in June; Provided HLD educational materials; Reviewed role and benefits of statin for ASCVD risk reduction; Discussed strategies to manage statin-induced myalgias; Reviewed importance of limiting foods high in cholesterol. 07-14-2021: Education on monitoring foods high in cholesterol. Patient states she is eating well.  Screening for signs and symptoms of depression related to chronic disease state;  Assessed social determinant of health barriers;    Hypertension: (Status: Goal on Track (progressing): YES.) Last practice recorded BP readings:     BP Readings from Last 3 Encounters:  05/07/21 128/80  04/01/21 130/70  03/17/21 136/60  Most recent eGFR/CrCl:       Lab Results  Component Value Date    EGFR 76 02/03/2021       Evaluation of current treatment  plan related to hypertension self management and patient's adherence to plan as established by provider. 05-12-2021: The patient is at goal for blood pressures. Denies any issues with HTN or heart health. Will continue to monitor. Feels she is doing well on the current regimen she is on. Denies any light headedness or dizziness. 07-14-2021: The patient is having more normal blood pressure readings. The patient states that she does not like to take her blood pressures at home as it makes her upset. She is doing well and is looking forward to retiring from her part time job she has been working for a while now. The patient states that she is doing good.;   Provided education to patient re: stroke prevention, s/s of heart attack and stroke; Reviewed prescribed diet heart healthy. 07-14-2021: the patient is compliant with heart healthy diet. Denies any new concerns at this time. Will continue to monitor.  Reviewed medications with patient and discussed importance of compliance. 07-14-2021: Denies any issues with medications compliance, states she takes her medications as ordered;  Discussed plans with patient for ongoing care management follow up and provided patient with direct contact information for care management team; Advised patient, providing education and rationale, to monitor blood pressure daily and record, calling PCP for findings outside established parameters. 12-31-2020: The patient does not like to take her blood pressures at home, she states that it makes her nervous and upset especially if it is up. The patient denies headaches, states she is sleeping well. Her daughter and granddaughter live with her and she is thankful for this.  She is working a part time job at [employer] and is happy about the income to help pay her bills. She denies any acute findings today.  03-03-2021: The patient states she gets upset when she checks her blood pressures at home. She is having some hip and knee pain but cannot  get in to see the specialist until March. The patient is working part time and is still very active. Will continue to monitor. 05-12-2021: The patient feels she does not need to take her blood pressures at home. 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 07-14-2021: The patient is doing well and denies any acute changes in her health and well being. Next week she will retire from her part time job at [employer]. She states she is excited about this. She denies any acute distress. Knows to call the office for changes, questions, or concerns.  Advised patient to discuss blood pressure trends with provider; Provided education on prescribed diet heart healthy;  Discussed complications of poorly controlled blood pressure such as heart disease, stroke, circulatory complications, vision complications, kidney impairment, sexual dysfunction;    Pain:  (Status: Goal on Track (progressing): YES.) Long Term Goal  Pain assessment performed. 12-31-2020: The patient states that she has some arthritis pain but she rest when she needs to. States she is only working part time and will continue to do so for now. She denies any pain today but knows her limits. She is excited about next week cooking and being with her family. 03-03-2021: The patient is having pain in her right knee and hip. Rates it at a 6 today on a scale of 0-10. The patient states she cannot get into see Dr. Jalene Mullet until March. He is the one who did her surgeries. She states that she is being careful and denies any acute issues. Discussed being seen at [facility] for unresolved pain or intense changes in pain. The patient states she feels like she can wait until March to see the specialist. Will continue to monitor for changes. 05-12-2021: The patient denies any pain in her shoulder or knee today. They mainly hurt when she is working.  She will follow up with the specialist who is monitoring her shoulder and her knee on 05-26-2021 for further evaluation and treatment options. 07-14-2021: The patient states that she is still having pain but it is not as bad as it has been. She rates her pain level at a 4 today on a scale of 0-10. 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 Education and support given. Will continue to monitor. Medications reviewed. 05-12-2021: Is compliant with medications and recently had an injection in her left shoulder about 2.5 weeks ago. She states this has been helpful but when she is working it aggravates the area. 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 07-14-2021: Takes medications as prescribed.  Reviewed provider established plan for pain management. 05-12-2021: Seeing specialist for management of pain. She states he gave her some exercises to do last week and this was helpful. He also is discussing her working with PT in the future. She follows  up with the specialist on 05-26-2021. Discussed importance of adherence to all scheduled medical appointments.pcp on 08-07-2021 Counseled on the importance of reporting any/all new or changed pain symptoms or management strategies to pain management provider; Advised patient to report to care team affect of pain on daily activities; Discussed use of relaxation techniques and/or diversional activities to assist with pain reduction (distraction, imagery, relaxation, massage, acupressure, TENS, heat, and cold application; Reviewed with patient prescribed pharmacological and nonpharmacological pain relief strategies; Advised patient to discuss unresolved pain, changes in level or intensity of pain with provider;    Our next appointment is by telephone on 10-13-2021 at 0900  am  Please call the care guide team at [phone] if you need to cancel or reschedule your appointment.   If you are experiencing a Mental Health or [place] or need someone to talk to, please call the Suicide and Crisis Lifeline: 988 call the [place] National Suicide Prevention Lifeline: (671) 314-6290 or TTY: 8505321660 TTY 7574542039) to talk to a trained counselor call 1-800-273-TALK (toll free, 24 hour hotline)   The patient verbalized understanding of instructions, educational materials, and care plan provided today and DECLINED offer to receive copy of patient instructions, educational materials, and care plan.  [name] RN, MSN, [facility] Mobile: [phone]

## 2021-07-14 NOTE — Chronic Care Management (AMB) (Signed)
Chronic Care Management   CCM RN Visit Note  07/14/2021 Name: Carol [name] MRN: [id number] DOB: Apr 21, 1950  Subjective: Carol [name] is a 71 y.o. year old female who is a primary care patient of Cannady, Barbaraann Faster, NP. The care management team was consulted for assistance with disease management and care coordination needs.    Engaged with patient by telephone for follow up visit in response to provider referral for case management and/or care coordination services.   Consent to Services:  The patient was given information about Chronic Care Management services, agreed to services, and gave verbal consent prior to initiation of services.  Please see initial visit note for detailed documentation.   Patient agreed to services and verbal consent obtained.   Assessment: Review of patient past medical history, allergies, medications, health status, including review of consultants reports, laboratory and other test data, was performed as part of comprehensive evaluation and provision of chronic care management services.   SDOH (Social Determinants of Health) assessments and interventions performed:    CCM Care Plan  No Known Allergies  Outpatient Encounter Medications as of 07/14/2021  Medication Sig   aspirin 81 MG tablet Take 81 mg by mouth daily.   atorvastatin (LIPITOR) 10 MG tablet TAKE 1 TABLET BY MOUTH EVERY OTHER DAY   diclofenac Sodium (VOLTAREN) 1 % GEL Apply 2 g topically 4 (four) times daily.   fluticasone (FLONASE) 50 MCG/ACT nasal spray SPRAY 2 SPRAYS INTO EACH NOSTRIL ONCE A DAY   lidocaine (LIDODERM) 5 % Place 1 patch onto the skin daily. Remove & Discard patch within 12 hours or as directed by MD   loratadine (CLARITIN) 10 MG tablet Take 10 mg by mouth daily.   losartan (COZAAR) 50 MG tablet TAKE 1/2 TABLET (25 MG TOTAL) BY MOUTH DAILY   meclizine (ANTIVERT) 25 MG tablet Take 1 tablet (25 mg total) by mouth 3 (three) times daily as needed for dizziness.   meloxicam  (MOBIC) 15 MG tablet Take 1 tablet (15 mg total) by mouth daily.   triamcinolone (KENALOG) 0.1 % Apply 1 application topically 2 (two) times daily.   No facility-administered encounter medications on file as of 07/14/2021.    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 12/02/2014    Conditions to be addressed/monitored:HTN, HLD, and Osteoarthritis  Care Plan : RNCM: General Plan of Care (Adult) for Chronic Disease Management and Care Coordination Needs  Updates made by Vanita Ingles, RN since 07/14/2021 12:00 AM     Problem: RNCM: Development of Plan of Care for Chronic Disease (HTN, HLD, OA)   Priority: High     Long-Range Goal: RNCM: Effective Management  of Plan of Care for Chronic Disease (HTN, HLD, OA)   Start Date: 12/31/2020  Expected End Date: 12/31/2021  Priority: High  Note:   Current Barriers:  Knowledge Deficits related to plan of care for management of HTN, HLD, and chronic pain   Chronic Disease Management support and education needs related to  HTN, HLD, and chronic pain   RNCM Clinical Goal(s):  Patient will verbalize understanding of plan for management of HTN, HLD, and Osteoarthritis as evidenced by following the plan of care and working with the CCM team and pcp to optimize health and well being  take all medications exactly as prescribed and will call provider for medication related questions as evidenced by taking medications as directed  and calling for refills before running out of medications     attend all scheduled medical appointments: 08-07-2021 at 0940 am with pcp  as evidenced by keeping appointments and calling for schedule changes        demonstrate improved and ongoing health management independence as evidenced by taking medications as directed, following a heart healthy diet, and preventive care and maintaining of health and well being        demonstrate a decrease in HTN, HLD, and Osteoarthritis exacerbations  as evidenced by working with the CCM team to optimize health and well being and effectively manage chronic condtions  demonstrate ongoing self health care management ability for effective management of chronic conditions  as evidenced by working with the CCM team through collaboration with Consulting civil engineer, provider, and care team.   Interventions: 1:1 collaboration with primary care provider regarding development and update of comprehensive plan of care as evidenced by provider attestation and co-signature Inter-disciplinary care team collaboration (see longitudinal plan of care) Evaluation of current treatment plan related to  self management and patient's adherence to plan as established by provider   Hyperlipidemia:  (Status: Goal on Track (progressing): YES.) Long Term Goal  Lab Results  Component Value Date   CHOL 168 02/03/2021   HDL 47 02/03/2021   [place] 104 (H) 02/03/2021   TRIG 91 02/03/2021  07-14-2021: Scheduled to have new blood work in June 2023   Medication review performed; medication list updated in electronic medical record. 07-14-2021: The patient is compliant with medications. The patient denies any issues with medications compliance. The patient takes Lipitor 10 mg QD. Denies any medication needs at this time. States she is taking as directed.  Provider established cholesterol goals reviewed. 03-03-2021: Review of goal of LDL. The patient is just over goal for LDL range. Education on  watching for dietary restrictions.  Counseled on importance of regular laboratory monitoring as prescribed. 07-14-2021: Has regular labwork and testing. Likely having new lab work at visit in June; Provided HLD educational materials; Reviewed role and benefits of statin for ASCVD risk reduction; Discussed strategies to manage statin-induced myalgias; Reviewed importance of limiting foods high in cholesterol. 07-14-2021: Education on monitoring foods high in cholesterol. Patient states she is eating well.  Screening for signs and symptoms of depression related to chronic disease state;  Assessed social determinant of health barriers;   Hypertension: (Status: Goal on Track (progressing): YES.) Last practice recorded BP readings:  BP Readings from Last 3 Encounters:  05/07/21 128/80  04/01/21 130/70  03/17/21 136/60  Most recent eGFR/CrCl:  Lab Results  Component Value Date   EGFR 76 02/03/2021      Evaluation of current treatment plan related to hypertension self management and patient's adherence to plan as established by provider. 05-12-2021: The patient is at goal for blood pressures. Denies any issues with HTN or heart health. Will continue to monitor. Feels she is doing well on the current regimen she is on. Denies any light headedness or dizziness. 07-14-2021: The patient is having more normal blood pressure readings. The patient states that she  does not like to take her blood pressures at home as it makes her upset. She is doing well and is looking forward to retiring from her part time job she has been working for a while now. The patient states that she is doing good.;   Provided education to patient re: stroke prevention, s/s of heart attack and stroke; Reviewed prescribed diet heart healthy. 07-14-2021: the patient is compliant with heart healthy diet. Denies any new concerns at this time. Will continue to monitor.  Reviewed medications with patient and discussed importance of compliance.  07-14-2021: Denies any issues with medications compliance, states she takes her medications as ordered;  Discussed plans with patient for ongoing care management follow up and provided patient with direct contact information for care management team; Advised patient, providing education and rationale, to monitor blood pressure daily and record, calling PCP for findings outside established parameters. 12-31-2020: The patient does not like to take her blood pressures at home, she states that it makes her nervous and upset especially if it is up. The patient denies headaches, states she is sleeping well. Her daughter and granddaughter live with her and she is thankful for this. She is working a part time job at [employer] and is happy about the income to help pay her bills. She denies any acute findings today.  03-03-2021: The patient states she gets upset when she checks her blood pressures at home. She is having some hip and knee pain but cannot get in to see the specialist until March. The patient is working part time and is still very active. Will continue to monitor. 05-12-2021: The patient feels she does not need to take her blood pressures at home. 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 07-14-2021: The patient is doing well and denies any acute changes in her health and well being. Next week she will retire from her part time job at [employer]. She states she is excited about this. She denies any acute distress. Knows to call the office for changes, questions, or concerns.  Advised patient to discuss blood pressure trends with provider; Provided education on prescribed diet heart healthy;  Discussed complications of poorly controlled blood pressure such as heart disease, stroke, circulatory complications, vision complications, kidney impairment, sexual dysfunction;   Pain:   (Status: Goal on Track (progressing): YES.) Long Term Goal  Pain assessment performed. 12-31-2020: The patient states that she has some arthritis pain but she rest when she needs to. States she is only working part time and will continue to do so for now. She denies any pain today but knows her limits. She is excited about next week cooking and being with her family. 03-03-2021: The patient is having pain in her right knee and hip. Rates it at a 6 today on a scale of 0-10. The patient states she cannot get into see Dr. Jalene Mullet until March. He is the one who did her surgeries. She states that she is being careful and denies any acute issues. Discussed being seen at [facility] for unresolved pain or intense changes in pain. The patient states she feels like she can wait until March to see the specialist. Will continue to monitor for changes. 05-12-2021: The patient denies any pain in her shoulder or knee today. They mainly hurt when she is working. She will follow  up with the specialist who is monitoring her shoulder and her knee on 05-26-2021 for further evaluation and treatment options. 07-14-2021: The patient states that she is still having pain but it is not as bad as it has been. She rates her pain level at a 4 today on a scale of 0-10. 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 Education and support given. Will continue to monitor. Medications reviewed. 05-12-2021: Is compliant with medications and recently had an injection in her left shoulder about 2.5 weeks ago. She states this has been helpful but when she is working it aggravates the area. 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 07-14-2021: Takes medications as prescribed.  Reviewed provider established plan for pain management. 05-12-2021: Seeing specialist  for management of pain. She states he gave her some exercises to do last week and this was helpful. He also is discussing her working with PT in the future. She follows up with the specialist on 05-26-2021. Discussed importance of adherence to all scheduled medical appointments.pcp on 08-07-2021 Counseled on the importance of reporting any/all new or changed pain symptoms or management strategies to pain management provider; Advised patient to report to care team affect of pain on daily activities; Discussed use of relaxation techniques and/or diversional activities to assist with pain reduction (distraction, imagery, relaxation, massage, acupressure, TENS, heat, and cold application; Reviewed with patient prescribed pharmacological and nonpharmacological pain relief strategies; Advised patient to discuss unresolved pain, changes in level or intensity of pain with provider;  Patient Goals/Self-Care Activities: Patient will self administer medications as prescribed as evidenced by self report/primary caregiver report  Patient will attend all scheduled provider appointments as evidenced by clinician review of documented attendance to scheduled appointments and patient/caregiver report Patient will call pharmacy for medication refills as evidenced by patient report and review of pharmacy fill history as appropriate Patient will attend church or other social activities as evidenced by patient report Patient will continue to perform ADL's independently as evidenced by patient/caregiver report Patient will continue to perform IADL's independently as evidenced by patient/caregiver report Patient will call provider office for new concerns or questions as evidenced by review of documented incoming telephone call notes and patient report Patient will work with BSW to address care coordination needs and will continue to work with the clinical team to address health care and disease management related needs as  evidenced by documented adherence to scheduled care management/care coordination appointments - check blood pressure weekly - choose a place to take my blood pressure (home, clinic or office, retail store) - write blood pressure results in a log or diary - learn about high blood pressure - keep a blood pressure log - take blood pressure log to all doctor appointments - call doctor for signs and symptoms of high blood pressure - develop an action plan for high blood pressure - keep all doctor appointments - take medications for blood pressure exactly as prescribed - begin an exercise program - report new symptoms to your doctor - eat more whole grains, fruits and vegetables, lean meats and healthy fats - call for medicine refill 2 or 3 days before it runs out - take all medications exactly as prescribed - call doctor with any symptoms you believe are related to your  medicine - call doctor when you experience any new symptoms - go to all doctor appointments as scheduled - adhere to prescribed diet: heart healthy diet        Plan:Telephone follow up appointment with care management team member scheduled for:  10-13-2021 at 0900 am  [place], MSN, [facility] Mobile: [phone]

## 2021-07-15 DIAGNOSIS — E785 Hyperlipidemia, unspecified: Secondary | ICD-10-CM | POA: Diagnosis not present

## 2021-07-15 DIAGNOSIS — I10 Essential (primary) hypertension: Secondary | ICD-10-CM

## 2021-08-02 NOTE — Patient Instructions (Incomplete)

## 2021-08-07 ENCOUNTER — Encounter: Payer: Self-pay | Admitting: Nurse Practitioner

## 2021-08-07 ENCOUNTER — Ambulatory Visit (INDEPENDENT_AMBULATORY_CARE_PROVIDER_SITE_OTHER): Payer: Medicare Other | Admitting: Nurse Practitioner

## 2021-08-07 VITALS — BP 128/78 | HR 86 | Temp 97.9°F | Resp 18 | Ht 62.0 in | Wt 152.6 lb

## 2021-08-07 DIAGNOSIS — E782 Mixed hyperlipidemia: Secondary | ICD-10-CM

## 2021-08-07 DIAGNOSIS — R7309 Other abnormal glucose: Secondary | ICD-10-CM | POA: Diagnosis not present

## 2021-08-07 DIAGNOSIS — R7989 Other specified abnormal findings of blood chemistry: Secondary | ICD-10-CM | POA: Diagnosis not present

## 2021-08-07 DIAGNOSIS — Z2821 Immunization not carried out because of patient refusal: Secondary | ICD-10-CM | POA: Diagnosis not present

## 2021-08-07 DIAGNOSIS — R801 Persistent proteinuria, unspecified: Secondary | ICD-10-CM

## 2021-08-07 DIAGNOSIS — I10 Essential (primary) hypertension: Secondary | ICD-10-CM

## 2021-08-07 LAB — MICROALBUMIN, URINE WAIVED
Creatinine, Urine Waived: 200 mg/dL (ref 10–300)
Microalb, Ur Waived: 80 mg/L — ABNORMAL HIGH (ref 0–19)

## 2021-08-07 LAB — BAYER DCA HB A1C WAIVED: HB A1C (BAYER DCA - WAIVED): 5.4 % (ref 4.8–5.6)

## 2021-08-07 NOTE — Assessment & Plan Note (Signed)
A1c with trend down today to 5.4%.  Praised her for this.  Continue diet focus at home.

## 2021-08-08 LAB — CBC WITH DIFFERENTIAL/PLATELET
Basophils Absolute: 0.1 10*3/uL (ref 0.0–0.2)
Basos: 1 %
EOS (ABSOLUTE): 0.1 10*3/uL (ref 0.0–0.4)
Eos: 3 %
Hematocrit: 37.6 % (ref 34.0–46.6)
Hemoglobin: 12.3 g/dL (ref 11.1–15.9)
Immature Grans (Abs): 0 10*3/uL (ref 0.0–0.1)
Immature Granulocytes: 0 %
Lymphocytes Absolute: 1.5 10*3/uL (ref 0.7–3.1)
Lymphs: 37 %
MCH: 29.6 pg (ref 26.6–33.0)
MCHC: 32.7 g/dL (ref 31.5–35.7)
MCV: 91 fL (ref 79–97)
Monocytes Absolute: 0.6 10*3/uL (ref 0.1–0.9)
Monocytes: 14 %
Neutrophils Absolute: 1.8 10*3/uL (ref 1.4–7.0)
Neutrophils: 45 %
Platelets: 544 10*3/uL — ABNORMAL HIGH (ref 150–450)
RBC: 4.15 x10E6/uL (ref 3.77–5.28)
RDW: 12.2 % (ref 11.7–15.4)
WBC: 4 10*3/uL (ref 3.4–10.8)

## 2021-08-08 LAB — LIPID PANEL W/O CHOL/HDL RATIO
Cholesterol, Total: 152 mg/dL (ref 100–199)
HDL: 52 mg/dL (ref >39)
LDL Chol Calc (NIH): 84 mg/dL (ref 0–99)
Triglycerides: 83 mg/dL (ref 0–149)
VLDL Cholesterol Cal: 16 mg/dL (ref 5–40)

## 2021-08-08 LAB — BASIC METABOLIC PANEL
BUN/Creatinine Ratio: 15 (ref 12–28)
BUN: 12 mg/dL (ref 8–27)
CO2: 24 mmol/L (ref 20–29)
Calcium: 9.7 mg/dL (ref 8.7–10.3)
Chloride: 102 mmol/L (ref 96–106)
Creatinine, Ser: 0.82 mg/dL (ref 0.57–1.00)
Glucose: 102 mg/dL — ABNORMAL HIGH (ref 70–99)
Potassium: 4 mmol/L (ref 3.5–5.2)
Sodium: 141 mmol/L (ref 134–144)
eGFR: 77 mL/min/{1.73_m2} (ref >59)

## 2021-08-08 LAB — THYROID PEROXIDASE ANTIBODY: Thyroperoxidase Ab SerPl-aCnc: 70 IU/mL — ABNORMAL HIGH (ref 0–34)

## 2021-08-08 LAB — TSH: TSH: 5.63 u[IU]/mL — ABNORMAL HIGH (ref 0.450–4.500)

## 2021-08-08 LAB — T4, FREE: Free T4: 1.05 ng/dL (ref 0.82–1.77)

## 2021-08-25 DIAGNOSIS — H40003 Preglaucoma, unspecified, bilateral: Secondary | ICD-10-CM | POA: Diagnosis not present

## 2021-08-25 DIAGNOSIS — H47393 Other disorders of optic disc, bilateral: Secondary | ICD-10-CM | POA: Diagnosis not present

## 2021-08-27 DIAGNOSIS — M1711 Unilateral primary osteoarthritis, right knee: Secondary | ICD-10-CM | POA: Diagnosis not present

## 2021-08-28 ENCOUNTER — Ambulatory Visit: Payer: Medicare Other

## 2021-09-01 ENCOUNTER — Ambulatory Visit (INDEPENDENT_AMBULATORY_CARE_PROVIDER_SITE_OTHER): Payer: Medicare Other | Admitting: *Deleted

## 2021-09-01 DIAGNOSIS — Z0000 Encounter for general adult medical examination without abnormal findings: Secondary | ICD-10-CM

## 2021-09-01 NOTE — Patient Instructions (Signed)
Ms. Carol [name] , Thank you for taking time to come for your Medicare Wellness Visit. I appreciate your ongoing commitment to your health goals. Please review the following plan we discussed and let me know if I can assist you in the future.   Screening recommendations/referrals: Colonoscopy: up to date Mammogram: up to date Bone Density: up to date Recommended yearly ophthalmology/optometry visit for glaucoma screening and checkup Recommended yearly dental visit for hygiene and checkup  Vaccinations: Influenza vaccine: up to date Pneumococcal vaccine: Education provided Tdap vaccine: Education provided Shingles vaccine: Education provided    Advanced directives: Education provided  Conditions/risks identified:   Next appointment: Case Manager TELEPHONE 10-13-2021 @ 9:00      11-09-2021 @ 9:20  Cannady   Preventive Care 70 Years and Older, Female Preventive care refers to lifestyle choices and visits with your health care provider that can promote health and wellness. What does preventive care include? A yearly physical exam. This is also called an annual well check. Dental exams once or twice a year. Routine eye exams. Ask your health care provider how often you should have your eyes checked. Personal lifestyle choices, including: Daily care of your teeth and gums. Regular physical activity. Eating a healthy diet. Avoiding tobacco and drug use. Limiting alcohol use. Practicing safe sex. Taking low-dose aspirin every day. Taking vitamin and mineral supplements as recommended by your health care provider. What happens during an annual well check? The services and screenings done by your health care provider during your annual well check will depend on your age, overall health, lifestyle risk factors, and family history of disease. Counseling  Your health care provider may ask you questions about your: Alcohol use. Tobacco use. Drug use. Emotional well-being. Home and relationship  well-being. Sexual activity. Eating habits. History of falls. Memory and ability to understand (cognition). Work and work Astronomer. Reproductive health. Screening  You may have the following tests or measurements: Height, weight, and BMI. Blood pressure. Lipid and cholesterol levels. These may be checked every 5 years, or more frequently if you are over 19 years old. Skin check. Lung cancer screening. You may have this screening every year starting at age 70 if you have a 30-pack-year history of smoking and currently smoke or have quit within the past 15 years. Fecal occult blood test (FOBT) of the stool. You may have this test every year starting at age 70. Flexible sigmoidoscopy or colonoscopy. You may have a sigmoidoscopy every 5 years or a colonoscopy every 10 years starting at age 70. Hepatitis C blood test. Hepatitis B blood test. Sexually transmitted disease (STD) testing. Diabetes screening. This is done by checking your blood sugar (glucose) after you have not eaten for a while (fasting). You may have this done every 1-3 years. Bone density scan. This is done to screen for osteoporosis. You may have this done starting at age 70. Mammogram. This may be done every 1-2 years. Talk to your health care provider about how often you should have regular mammograms. Talk with your health care provider about your test results, treatment options, and if necessary, the need for more tests. Vaccines  Your health care provider may recommend certain vaccines, such as: Influenza vaccine. This is recommended every year. Tetanus, diphtheria, and acellular pertussis (Tdap, Td) vaccine. You may need a Td booster every 10 years. Zoster vaccine. You may need this after age 15. Pneumococcal 13-valent conjugate (PCV13) vaccine. One dose is recommended after age 70. Pneumococcal polysaccharide (PPSV23) vaccine. One dose is  recommended after age 70. Talk to your health care provider about which  screenings and vaccines you need and how often you need them. This information is not intended to replace advice given to you by your health care provider. Make sure you discuss any questions you have with your health care provider. Document Released: 02/28/2015 Document Revised: 10/22/2015 Document Reviewed: 12/03/2014 Elsevier Interactive Patient Education  2017 [place] Prevention in the Home Falls can cause injuries. They can happen to people of all ages. There are many things you can do to make your home safe and to help prevent falls. What can I do on the outside of my home? Regularly fix the edges of walkways and driveways and fix any cracks. Remove anything that might make you trip as you walk through a door, such as a raised step or threshold. Trim any bushes or trees on the path to your home. Use bright outdoor lighting. Clear any walking paths of anything that might make someone trip, such as rocks or tools. Regularly check to see if handrails are loose or broken. Make sure that both sides of any steps have handrails. Any raised decks and porches should have guardrails on the edges. Have any leaves, snow, or ice cleared regularly. Use sand or salt on walking paths during winter. Clean up any spills in your garage right away. This includes oil or grease spills. What can I do in the bathroom? Use night lights. Install grab bars by the toilet and in the tub and shower. Do not use towel bars as grab bars. Use non-skid mats or decals in the tub or shower. If you need to sit down in the shower, use a plastic, non-slip stool. Keep the floor dry. Clean up any water that spills on the floor as soon as it happens. Remove soap buildup in the tub or shower regularly. Attach bath mats securely with double-sided non-slip rug tape. Do not have throw rugs and other things on the floor that can make you trip. What can I do in the bedroom? Use night lights. Make sure that you have a  light by your bed that is easy to reach. Do not use any sheets or blankets that are too big for your bed. They should not hang down onto the floor. Have a firm chair that has side arms. You can use this for support while you get dressed. Do not have throw rugs and other things on the floor that can make you trip. What can I do in the kitchen? Clean up any spills right away. Avoid walking on wet floors. Keep items that you use a lot in easy-to-reach places. If you need to reach something above you, use a strong step stool that has a grab bar. Keep electrical cords out of the way. Do not use floor polish or wax that makes floors slippery. If you must use wax, use non-skid floor wax. Do not have throw rugs and other things on the floor that can make you trip. What can I do with my stairs? Do not leave any items on the stairs. Make sure that there are handrails on both sides of the stairs and use them. Fix handrails that are broken or loose. Make sure that handrails are as long as the stairways. Check any carpeting to make sure that it is firmly attached to the stairs. Fix any carpet that is loose or worn. Avoid having throw rugs at the top or bottom of the stairs. If  you do have throw rugs, attach them to the floor with carpet tape. Make sure that you have a light switch at the top of the stairs and the bottom of the stairs. If you do not have them, ask someone to add them for you. What else can I do to help prevent falls? Wear shoes that: Do not have high heels. Have rubber bottoms. Are comfortable and fit you well. Are closed at the toe. Do not wear sandals. If you use a stepladder: Make sure that it is fully opened. Do not climb a closed stepladder. Make sure that both sides of the stepladder are locked into place. Ask someone to hold it for you, if possible. Clearly mark and make sure that you can see: Any grab bars or handrails. First and last steps. Where the edge of each step  is. Use tools that help you move around (mobility aids) if they are needed. These include: Canes. Walkers. Scooters. Crutches. Turn on the lights when you go into a dark area. Replace any light bulbs as soon as they burn out. Set up your furniture so you have a clear path. Avoid moving your furniture around. If any of your floors are uneven, fix them. If there are any pets around you, be aware of where they are. Review your medicines with your doctor. Some medicines can make you feel dizzy. This can increase your chance of falling. Ask your doctor what other things that you can do to help prevent falls. This information is not intended to replace advice given to you by your health care provider. Make sure you discuss any questions you have with your health care provider. Document Released: 11/28/2008 Document Revised: 07/10/2015 Document Reviewed: 03/08/2014 Elsevier Interactive Patient Education  2017 [employer].

## 2021-09-01 NOTE — Progress Notes (Signed)
Subjective:   Carol [name] is a 70 y.o. female who presents for Medicare Annual (Subsequent) preventive examination.  I connected with  Carol [name] on 09/01/21 by a  telephone enabled telemedicine application and verified that I am speaking with the correct person using two identifiers.   I discussed the limitations of evaluation and management by telemedicine. The patient expressed understanding and agreed to proceed.  Patient location: home  Provider location:  Tele-health-home    Review of Systems           Objective:    There were no vitals filed for this visit. There is no height or weight on file to calculate BMI.     08/25/2020   11:16 AM 08/16/2019    9:51 AM 08/13/2019   11:18 AM 01/31/2017    9:01 AM 01/30/2016    9:53 AM 01/27/2015    1:59 PM  Advanced Directives  Does Patient Have a Medical Advance Directive? No No No Yes No No  Does patient want to make changes to medical advance directive?    Yes (MAU/Ambulatory/Procedural Areas - Information given)    Would patient like information on creating a medical advance directive?  Yes (MAU/Ambulatory/Procedural Areas - Information given)        Current Medications (verified) Outpatient Encounter Medications as of 09/01/2021  Medication Sig   aspirin 81 MG tablet Take 81 mg by mouth daily.   atorvastatin (LIPITOR) 10 MG tablet TAKE 1 TABLET BY MOUTH EVERY OTHER DAY   diclofenac Sodium (VOLTAREN) 1 % GEL Apply 2 g topically 4 (four) times daily.   fluticasone (FLONASE) 50 MCG/ACT nasal spray SPRAY 2 SPRAYS INTO EACH NOSTRIL ONCE A DAY   lidocaine (LIDODERM) 5 % Place 1 patch onto the skin daily. Remove & Discard patch within 12 hours or as directed by MD   loratadine (CLARITIN) 10 MG tablet Take 10 mg by mouth daily.   losartan (COZAAR) 50 MG tablet TAKE 1/2 TABLET (25 MG TOTAL) BY MOUTH DAILY   meclizine (ANTIVERT) 25 MG tablet Take 1 tablet (25 mg total) by mouth 3 (three) times daily as needed for  dizziness.   meloxicam (MOBIC) 15 MG tablet Take 1 tablet (15 mg total) by mouth daily.   triamcinolone (KENALOG) 0.1 % Apply 1 application topically 2 (two) times daily.   No facility-administered encounter medications on file as of 09/01/2021.    Allergies (verified) Patient has no known allergies.   History: Past Medical History:  Diagnosis Date   Alkaline phosphatase elevation    Allergy    Eczema    GERD (gastroesophageal reflux disease)    Heart murmur    Hypertension    Muscle cramps    Osteoarthritis    Osteoporosis    Past Surgical History:  Procedure Laterality Date   CATARACT EXTRACTION W/PHACO Right 08/16/2019   Procedure: CATARACT EXTRACTION PHACO AND INTRAOCULAR LENS PLACEMENT (IOC) RIGHT VISION BLUE 5.18  00:36.0;  Surgeon: Elliot Cousin, MD;  Location: [facility];  Service: Ophthalmology;  Laterality: Right;   CATARACT EXTRACTION W/PHACO Left 10/04/2019   Procedure: CATARACT EXTRACTION PHACO AND INTRAOCULAR LENS PLACEMENT (IOC) LEFT VISION BLUE;  Surgeon: Elliot Cousin, MD;  Location: [facility];  Service: Ophthalmology;  Laterality: Left;  6.47 0:39.8   JOINT REPLACEMENT Right 2013   hip   Family History  Problem Relation Age of Onset   Cancer Mother    CAD Mother    Hypertension Mother    Cancer Father  throat and lung   Hypertension Brother    Hypertension Daughter    Hypertension Son    Hypertension Brother    Hypertension Brother    Breast cancer Neg Hx    Social History   Socioeconomic History   Marital status: Married    Spouse name: Not on file   Number of children: Not on file   Years of education: 12   Highest education level: 12th grade  Occupational History   Occupation: retired  Tobacco Use   Smoking status: Never   Smokeless tobacco: Never  Vaping Use   Vaping Use: Never used  Substance and Sexual Activity   Alcohol use: No    Alcohol/week: 0.0 standard drinks of alcohol   Drug use: No   Sexual  activity: Yes  Other Topics Concern   Not on file  Social History Narrative   Not on file   Social Determinants of Health   Financial Resource Strain: Low Risk  (12/31/2020)   Overall Financial Resource Strain (CARDIA)    Difficulty of Paying Living Expenses: Not hard at all  Food Insecurity: No Food Insecurity (12/31/2020)   Hunger Vital Sign    Worried About Running Out of Food in the Last Year: Never true    Ran Out of Food in the Last Year: Never true  Transportation Needs: No Transportation Needs (12/31/2020)   PRAPARE - Administrator, Civil Service (Medical): No    Lack of Transportation (Non-Medical): No  Physical Activity: Inactive (12/31/2020)   Exercise Vital Sign    Days of Exercise per Week: 0 days    Minutes of Exercise per Session: 0 min  Stress: No Stress Concern Present (12/31/2020)   [employer] of Occupational Health - Occupational Stress Questionnaire    Feeling of Stress : Only a little  Social Connections: Moderately Integrated (12/31/2020)   Social Connection and Isolation Panel [NHANES]    Frequency of Communication with Friends and Family: More than three times a week    Frequency of Social Gatherings with Friends and Family: More than three times a week    Attends Religious Services: More than 4 times per year    Active Member of [employer] or Organizations: Yes    Attends Banker Meetings: More than 4 times per year    Marital Status: Widowed    Tobacco Counseling Counseling given: Not Answered   Clinical Intake:                 Diabetic?  no         Activities of Daily Living     No data to display          Patient Care Team: Marjie Skiff, NP as PCP - General (Nurse Practitioner) Donato Heinz, MD (Orthopedic Surgery) Marlowe Sax, RN as Case Manager (General Practice) Gustavus Bryant, LCSW as Social Worker (Licensed Clinical Social Worker)  Indicate any recent [employer] you  may have received from other than [facility] providers in the past year (date may be approximate).     Assessment:   This is a routine wellness examination for Carol [name].  Hearing/Vision screen No results found.  Dietary issues and exercise activities discussed:     Goals Addressed   None    Depression Screen    08/07/2021   10:13 AM 08/07/2021    9:46 AM 05/07/2021    8:53 AM 04/01/2021    9:31 AM 02/03/2021   10:49 AM  08/25/2020   11:17 AM 05/22/2020   11:24 AM  PHQ 2/9 Scores  PHQ - 2 Score 0   0 0 0 0  PHQ- 9 Score 0   0 0    Exception Documentation  Patient refusal Patient refusal        Fall Risk    04/01/2021    9:31 AM 02/03/2021   10:49 AM 08/25/2020   11:17 AM 05/22/2020   11:24 AM 03/18/2020    1:25 PM  Fall Risk   Falls in the past year? 0 0 0 0 0  Number falls in past yr: 0 0     Injury with Fall? 0 0     Risk for fall due to : No Fall Risks No Fall Risks Medication side effect    Follow up Falls evaluation completed Falls evaluation completed Falls evaluation completed;Education provided;Falls prevention discussed      FALL RISK PREVENTION PERTAINING TO THE HOME:  Any stairs in or around the home? No  If so, are there any without handrails? No  Home free of loose throw rugs in walkways, pet beds, electrical cords, etc? Yes  Adequate lighting in your home to reduce risk of falls? Yes   ASSISTIVE DEVICES UTILIZED TO PREVENT FALLS:  Life alert? No  Use of a cane, walker or w/c? No  Grab bars in the bathroom? No  Shower chair or bench in shower? No  Elevated toilet seat or a handicapped toilet? No   TIMED UP AND GO:  Was the test performed? No .    Cognitive Function:        08/25/2020   11:19 AM 08/13/2019   11:22 AM 01/31/2017    9:04 AM  6CIT Screen  What Year? 0 points 0 points 0 points  What month? 0 points 0 points 0 points  What time? 0 points 0 points 0 points  Count back from 20 0 points 0 points 0 points  Months in reverse 0 points 4 points  0 points  Repeat phrase 0 points 0 points 0 points  Total Score 0 points 4 points 0 points    Immunizations Immunization History  Administered Date(s) Administered   Fluad Quad(high Dose 65+) 01/02/2019, 11/27/2019, 02/03/2021   Influenza, High Dose Seasonal PF 12/21/2017   Influenza,inj,Quad PF,6+ Mos 01/27/2015, 02/13/2016   Janssen (J&J) SARS-COV-2 Vaccination 05/04/2019   Td 11/17/2004    TDAP status: Due, Education has been provided regarding the importance of this vaccine. Advised may receive this vaccine at local pharmacy or Health Dept. Aware to provide a copy of the vaccination record if obtained from local pharmacy or Health Dept. Verbalized acceptance and understanding.  Flu Vaccine status: Up to date  Pneumococcal vaccine status: Due, Education has been provided regarding the importance of this vaccine. Advised may receive this vaccine at local pharmacy or Health Dept. Aware to provide a copy of the vaccination record if obtained from local pharmacy or Health Dept. Verbalized acceptance and understanding.  Covid-19 vaccine status: Information provided on how to obtain vaccines.   Qualifies for Shingles Vaccine? Yes   Zostavax completed No   Shingrix Completed?: No.    Education has been provided regarding the importance of this vaccine. Patient has been advised to call insurance company to determine out of pocket expense if they have not yet received this vaccine. Advised may also receive vaccine at local pharmacy or Health Dept. Verbalized acceptance and understanding.  Screening Tests Health Maintenance  Topic Date Due  COVID-19 Vaccine (2 - Booster for Janssen series) 06/29/2019   Zoster Vaccines- Shingrix (1 of 2) 11/07/2021 (Originally 05/24/2001)   Pneumonia Vaccine 83+ Years old (1 - PCV) 03/17/2022 (Originally 05/24/2016)   TETANUS/TDAP  05/08/2022 (Originally 11/18/2014)   INFLUENZA VACCINE  09/15/2021   COLON CANCER SCREENING ANNUAL FOBT  05/21/2022   MAMMOGRAM   02/26/2023   DEXA SCAN  05/01/2028   Hepatitis C Screening  Completed   HPV VACCINES  Aged Out   COLONOSCOPY (Pts 45-2yrs Insurance coverage will need to be confirmed)  Discontinued    Health Maintenance  Health Maintenance Due  Topic Date Due   COVID-19 Vaccine (2 - Booster for Janssen series) 06/29/2019    Colorectal cancer screening: Type of screening: FOBT/FIT. Completed  . Repeat every 1 years  Mammogram status: Completed  . Repeat every year  Bone Density status: Completed  . Results reflect: Bone density results: NORMAL. Repeat every 0 years.  Lung Cancer Screening: (Low Dose CT Chest recommended if Age 86-80 years, 30 pack-year currently smoking OR have quit w/in 15years.) does not qualify.   Lung Cancer Screening Referral:   Additional Screening:  Hepatitis C Screening: does not qualify; Completed 2016  Vision Screening: Recommended annual ophthalmology exams for early detection of glaucoma and other disorders of the eye. Is the patient up to date with their annual eye exam?  Yes  Who is the provider or what is the name of the office in which the patient attends annual eye exams? [facility] If pt is not established with a provider, would they like to be referred to a provider to establish care? No .   Dental Screening: Recommended annual dental exams for proper oral hygiene  Community Resource Referral / Chronic Care Management: CRR required this visit?  No   CCM required this visit?  No      Plan:     I have personally reviewed and noted the following in the patient's chart:   Medical and social history Use of alcohol, tobacco or illicit drugs  Current medications and supplements including opioid prescriptions.  Functional ability and status Nutritional status Physical activity Advanced directives List of other physicians Hospitalizations, surgeries, and ER visits in previous 12 months Vitals Screenings to include cognitive, depression, and  falls Referrals and appointments  In addition, I have reviewed and discussed with patient certain preventive protocols, quality metrics, and best practice recommendations. A written personalized care plan for preventive services as well as general preventive health recommendations were provided to patient.     Remi Haggard, LPN   2/54/2706   Nurse Notes:

## 2021-10-01 DIAGNOSIS — M1711 Unilateral primary osteoarthritis, right knee: Secondary | ICD-10-CM | POA: Diagnosis not present

## 2021-10-07 ENCOUNTER — Encounter: Payer: Self-pay | Admitting: Nurse Practitioner

## 2021-10-07 ENCOUNTER — Ambulatory Visit (INDEPENDENT_AMBULATORY_CARE_PROVIDER_SITE_OTHER): Payer: Medicare Other | Admitting: Nurse Practitioner

## 2021-10-07 VITALS — BP 136/80 | HR 84 | Temp 98.1°F | Ht 62.0 in | Wt 151.9 lb

## 2021-10-07 DIAGNOSIS — J3489 Other specified disorders of nose and nasal sinuses: Secondary | ICD-10-CM | POA: Diagnosis not present

## 2021-10-07 MED ORDER — PREDNISONE 20 MG PO TABS: 40.0000 mg | ORAL_TABLET | Freq: Every day | ORAL | 0 refills | Status: AC

## 2021-10-07 MED ORDER — BENZONATATE 100 MG PO CAPS: 100.0000 mg | ORAL_CAPSULE | Freq: Three times a day (TID) | ORAL | 0 refills | Status: DC | PRN

## 2021-10-07 NOTE — Assessment & Plan Note (Signed)
Acute for 3 days, will obtain Covid testing today.  Script for Prednisone 40 MG x 5 days and Tessalon sent, suspect more viral in presentation.  Discussed with her no abx at this time, if symptoms last 7 days or more will send in abx therapy.  Recommend: - Increased rest - Increasing Fluids - Acetaminophen as needed for fever/pain.  - Salt water gargling, chloraseptic spray and throat lozenges - Mucinex.  - Humidifying the air. Return as needed if worsening or ongoing symptoms.

## 2021-10-07 NOTE — Progress Notes (Signed)
BP 136/80 (BP Location: Left Arm, Patient Position: Sitting, Cuff Size: Normal) Comment: Patient Refused  Pulse 84   Temp 98.1 F (36.7 C) (Oral)   Ht $R'5\' 2"'JT$  (1.575 m)   Wt 151 lb 14.4 oz (68.9 kg)   LMP  (LMP Unknown)   SpO2 98%   BMI 27.78 kg/m    Subjective:    Patient ID: Carol [name], female    DOB: [birth year], 71 y.o.   MRN: [id number]  HPI: Carol [name] is a 72 y.o. female  Chief Complaint  Patient presents with   Nasal Congestion    Patient is here for stuffy nose. Patient says she been having a stuffy nose and a slight for the past two days. Patient denies having any fever or chills. Patient says this is an ongoing issue, but when she starts to cough, it causes her chest to start hurting. Patient says she has been taking RobtussinDM over the counter and says it helps a little bit.    UPPER RESPIRATORY TRACT INFECTION Been present for 2 days.  Started with stuffy nose and congestion.  Feels she gets this every year. Worst symptom: stuffy nose Fever: no Cough: yes Shortness of breath: no Wheezing: no Chest pain: no Chest tightness: no Chest congestion: no Nasal congestion: yes Runny nose: yes Post nasal drip: yes Sneezing: no Sore throat: no Swollen glands: no Sinus pressure: no Headache: no Face pain: no Toothache: no Ear pain: none Ear pressure: none Eyes red/itching:no Eye drainage/crusting: no  Vomiting: no Rash: no Fatigue: no Sick contacts: no Strep contacts: no  Context: stable Recurrent sinusitis: no Relief with OTC cold/cough medications: Robitussin, Claritin daily  Treatments attempted: cold/sinus and mucinex    Relevant past medical, surgical, family and social history reviewed and updated as indicated. Interim medical history since our last visit reviewed. Allergies and medications reviewed and updated.  Review of Systems  Constitutional:  Positive for fatigue. Negative for activity change, appetite change, chills and fever.   HENT:  Positive for congestion, postnasal drip and rhinorrhea. Negative for ear discharge, ear pain, facial swelling, sinus pressure, sinus pain, sneezing, sore throat and voice change.   Eyes:  Negative for pain and visual disturbance.  Respiratory:  Positive for cough. Negative for chest tightness, shortness of breath and wheezing.   Cardiovascular:  Negative for chest pain, palpitations and leg swelling.  Gastrointestinal: Negative.   Psychiatric/Behavioral: Negative.      Per HPI unless specifically indicated above     Objective:    BP 136/80 (BP Location: Left Arm, Patient Position: Sitting, Cuff Size: Normal) Comment: Patient Refused  Pulse 84   Temp 98.1 F (36.7 C) (Oral)   Ht $R'5\' 2"'AI$  (1.575 m)   Wt 151 lb 14.4 oz (68.9 kg)   LMP  (LMP Unknown)   SpO2 98%   BMI 27.78 kg/m   Wt Readings from Last 3 Encounters:  10/07/21 151 lb 14.4 oz (68.9 kg)  08/07/21 152 lb 9.6 oz (69.2 kg)  05/07/21 153 lb (69.4 kg)    Physical Exam Vitals and nursing note reviewed.  Constitutional:      General: She is awake. She is not in acute distress.    Appearance: She is well-developed and overweight. She is not ill-appearing or toxic-appearing.  HENT:     Head: Normocephalic.     Right Ear: Hearing, tympanic membrane, ear canal and external ear normal.     Left Ear: Hearing, tympanic membrane, ear canal and external ear  normal.     Nose: Congestion and rhinorrhea present. Rhinorrhea is clear.     Right Sinus: No maxillary sinus tenderness or frontal sinus tenderness.     Left Sinus: No maxillary sinus tenderness or frontal sinus tenderness.     Mouth/Throat:     Mouth: Mucous membranes are moist.     Pharynx: Posterior oropharyngeal erythema (mild cobblestone appearance) present. No pharyngeal swelling or oropharyngeal exudate.     Tonsils: No tonsillar exudate. 2+ on the right. 2+ on the left.  Eyes:     General: Lids are normal.        Right eye: No discharge.        Left eye: No  discharge.     Conjunctiva/sclera: Conjunctivae normal.     Pupils: Pupils are equal, round, and reactive to light.  Neck:     Thyroid: No thyromegaly.     Vascular: No carotid bruit.  Cardiovascular:     Rate and Rhythm: Normal rate and regular rhythm.     Heart sounds: Normal heart sounds. No murmur heard.    No gallop.  Pulmonary:     Effort: Pulmonary effort is normal. No accessory muscle usage or respiratory distress.     Breath sounds: Normal breath sounds.  Abdominal:     General: Bowel sounds are normal.     Palpations: Abdomen is soft.  Musculoskeletal:     Cervical back: Normal range of motion and neck supple.     Right lower leg: No edema.     Left lower leg: No edema.  Lymphadenopathy:     Head:     Right side of head: No submental, submandibular, tonsillar, preauricular or posterior auricular adenopathy.     Left side of head: No submental, submandibular, tonsillar, preauricular or posterior auricular adenopathy.     Cervical: No cervical adenopathy.  Skin:    General: Skin is warm and dry.  Neurological:     Mental Status: She is alert and oriented to person, place, and time.  Psychiatric:        Attention and Perception: Attention normal.        Mood and Affect: Mood normal.        Speech: Speech normal.        Behavior: Behavior normal. Behavior is cooperative.        Thought Content: Thought content normal.     Results for orders placed or performed in visit on 16/10/96  Basic metabolic panel  Result Value Ref Range   Glucose 102 (H) 70 - 99 mg/dL   BUN 12 8 - 27 mg/dL   Creatinine, Ser 0.82 0.57 - 1.00 mg/dL   eGFR 77 >59 mL/min/1.73   BUN/Creatinine Ratio 15 12 - 28   Sodium 141 134 - 144 mmol/L   Potassium 4.0 3.5 - 5.2 mmol/L   Chloride 102 96 - 106 mmol/L   CO2 24 20 - 29 mmol/L   Calcium 9.7 8.7 - 10.3 mg/dL  Bayer DCA Hb A1c Waived  Result Value Ref Range   HB A1C (BAYER DCA - WAIVED) 5.4 4.8 - 5.6 %  Microalbumin, Urine Waived  Result  Value Ref Range   Microalb, Ur Waived 80 (H) 0 - 19 mg/L   Creatinine, Urine Waived 200 10 - 300 mg/dL   Microalb/Creat Ratio 30-300 (H) <30 mg/g  TSH  Result Value Ref Range   TSH 5.630 (H) 0.450 - 4.500 uIU/mL  Lipid Panel w/o Chol/HDL Ratio  Result Value Ref  Range   Cholesterol, Total 152 100 - 199 mg/dL   Triglycerides 83 0 - 149 mg/dL   HDL 52 >39 mg/dL   VLDL Cholesterol Cal 16 5 - 40 mg/dL   LDL Chol Calc (NIH) 84 0 - 99 mg/dL  CBC with Differential/Platelet  Result Value Ref Range   WBC 4.0 3.4 - 10.8 x10E3/uL   RBC 4.15 3.77 - 5.28 x10E6/uL   Hemoglobin 12.3 11.1 - 15.9 g/dL   Hematocrit 37.6 34.0 - 46.6 %   MCV 91 79 - 97 fL   MCH 29.6 26.6 - 33.0 pg   MCHC 32.7 31.5 - 35.7 g/dL   RDW 12.2 11.7 - 15.4 %   Platelets 544 (H) 150 - 450 x10E3/uL   Neutrophils 45 Not Estab. %   Lymphs 37 Not Estab. %   Monocytes 14 Not Estab. %   Eos 3 Not Estab. %   Basos 1 Not Estab. %   Neutrophils Absolute 1.8 1.4 - 7.0 x10E3/uL   Lymphocytes Absolute 1.5 0.7 - 3.1 x10E3/uL   Monocytes Absolute 0.6 0.1 - 0.9 x10E3/uL   EOS (ABSOLUTE) 0.1 0.0 - 0.4 x10E3/uL   Basophils Absolute 0.1 0.0 - 0.2 x10E3/uL   Immature Granulocytes 0 Not Estab. %   Immature Grans (Abs) 0.0 0.0 - 0.1 x10E3/uL  Thyroid peroxidase antibody  Result Value Ref Range   Thyroperoxidase Ab SerPl-aCnc 70 (H) 0 - 34 IU/mL  T4, free  Result Value Ref Range   Free T4 1.05 0.82 - 1.77 ng/dL      Assessment & Plan:   Problem List Items Addressed This Visit       Other   Sinus drainage - Primary    Acute for 3 days, will obtain Covid testing today.  Script for Prednisone 40 MG x 5 days and Tessalon sent, suspect more viral in presentation.  Discussed with her no abx at this time, if symptoms last 7 days or more will send in abx therapy.  Recommend: - Increased rest - Increasing Fluids - Acetaminophen as needed for fever/pain.  - Salt water gargling, chloraseptic spray and throat lozenges - Mucinex.  -  Humidifying the air. Return as needed if worsening or ongoing symptoms.      Relevant Orders   Novel Coronavirus, NAA ([facility])     Follow up plan: Return if symptoms worsen or fail to improve.

## 2021-10-07 NOTE — Patient Instructions (Signed)

## 2021-10-08 LAB — NOVEL CORONAVIRUS, NAA: SARS-CoV-2, NAA: NOT DETECTED

## 2021-10-08 NOTE — Progress Notes (Signed)
Good morning, please let Carol [name] know her Covid returned negative:) Get some rest and lots of fluids.  Have a great day!! Keep being amazing!!  Thank you for allowing me to participate in your care.  I appreciate you. Kindest regards, Jovanny Stephanie

## 2021-10-13 ENCOUNTER — Telehealth: Payer: Medicare Other

## 2021-10-13 ENCOUNTER — Ambulatory Visit: Payer: Self-pay

## 2021-10-13 NOTE — Patient Instructions (Signed)
Visit Information  Thank you for taking time to visit with me today. Please don't hesitate to contact me if I can be of assistance to you.   Following are the goals we discussed today:   Goals Addressed             This Visit's Progress    RNCM: Effective Management of Cough       Care Coordination Interventions: Evaluation of current treatment plan related to cough and sinus drainage and patient's adherence to plan as established by provider Advised patient to call the office for changes in cough, new onset of sx and sx of infection, questions or concerns Provided education to patient re: monitoring for changes in the color of sputum and to call the office for green or brown color sputum, fever, chills or other questions or concerns related to chronic conditions Reviewed medications with patient and discussed compliance. The patient states that she could not take the prednisone BID but she took it once a day until the dosage was complete. The patient states that it was keeping her awake at night Reviewed scheduled/upcoming provider appointments including 11-09-2021, reminder given today Discussed plans with patient for ongoing care management follow up and provided patient with direct contact information for care management team Advised patient to discuss changes in conditions, new concerns or questions about health and well being with provider Screening for signs and symptoms of depression related to chronic disease state  Assessed social determinant of health barriers Patient interviewed about adult health maintenance status including  Depression screen    Advised patient to discuss  Blood Pressure    with primary care provider  AWV completed in July           Our next appointment is by telephone on 01-13-2022 at 0900 am  Please call the care guide team at [phone] if you need to cancel or reschedule your appointment.   If you are experiencing a Mental Health or Behavioral  Health Crisis or need someone to talk to, please call the Suicide and Crisis Lifeline: 988 call the [place] National Suicide Prevention Lifeline: (740)808-2955 or TTY: 934-343-2493 TTY 7144980463) to talk to a trained counselor call 1-800-273-TALK (toll free, 24 hour hotline)  The patient verbalized understanding of instructions, educational materials, and care plan provided today and DECLINED offer to receive copy of patient instructions, educational materials, and care plan.   Telephone follow up appointment with care management team member scheduled for: 01-13-2022 at 0900 am  Alto Denver RN, MSN, CCM [facility] Coordinator [facility]  [facility] Mobile: [phone]

## 2021-10-13 NOTE — Patient Outreach (Signed)
  Care Coordination   Follow Up Visit Note   10/13/2021 Name: Carol [name] MRN: [id number] DOB: 1950/10/22  Carol [name] is a 70 y.o. year old female who sees [place], Corrie Dandy T, NP for primary care. I spoke with  Carol [name] by phone today.  What matters to the patients health and wellness today?  Patient is still having a cough but a lot better    Goals Addressed             This Visit's Progress    RNCM: Effective Management of Cough       Care Coordination Interventions: Evaluation of current treatment plan related to cough and sinus drainage and patient's adherence to plan as established by provider Advised patient to call the office for changes in cough, new onset of sx and sx of infection, questions or concerns Provided education to patient re: monitoring for changes in the color of sputum and to call the office for green or brown color sputum, fever, chills or other questions or concerns related to chronic conditions Reviewed medications with patient and discussed compliance. The patient states that she could not take the prednisone BID but she took it once a day until the dosage was complete. The patient states that it was keeping her awake at night Reviewed scheduled/upcoming provider appointments including 11-09-2021, reminder given today Discussed plans with patient for ongoing care management follow up and provided patient with direct contact information for care management team Advised patient to discuss changes in conditions, new concerns or questions about health and well being with provider Screening for signs and symptoms of depression related to chronic disease state  Assessed social determinant of health barriers Patient interviewed about adult health maintenance status including  Depression screen    Advised patient to discuss  Blood Pressure    with primary care provider  AWV completed in July           SDOH assessments and interventions completed:   Yes  SDOH Interventions Today    Flowsheet Row Most Recent Value  SDOH Interventions   Food Insecurity Interventions Intervention Not Indicated  Housing Interventions Intervention Not Indicated  Social Connections Interventions Intervention Not Indicated, Other (Comment)  [has good support from her family, wants to get a part time job]        Care Coordination Interventions Activated:  Yes  Care Coordination Interventions:  Yes, provided   Follow up plan: Follow up call scheduled for 01-13-2022 at 0900 am    Encounter Outcome:  Pt. Visit Completed   Alto Denver RN, MSN, CCM Community Care Coordinator [facility]  [facility] Mobile: [phone]

## 2021-11-07 NOTE — Patient Instructions (Signed)

## 2021-11-09 ENCOUNTER — Ambulatory Visit (INDEPENDENT_AMBULATORY_CARE_PROVIDER_SITE_OTHER): Payer: Medicare Other | Admitting: Nurse Practitioner

## 2021-11-09 ENCOUNTER — Encounter: Payer: Self-pay | Admitting: Nurse Practitioner

## 2021-11-09 VITALS — BP 136/68 | HR 78 | Temp 98.0°F | Resp 18 | Ht 62.0 in | Wt 153.6 lb

## 2021-11-09 DIAGNOSIS — E063 Autoimmune thyroiditis: Secondary | ICD-10-CM

## 2021-11-09 DIAGNOSIS — Z23 Encounter for immunization: Secondary | ICD-10-CM

## 2021-11-09 DIAGNOSIS — I10 Essential (primary) hypertension: Secondary | ICD-10-CM

## 2021-11-09 DIAGNOSIS — E782 Mixed hyperlipidemia: Secondary | ICD-10-CM

## 2021-11-09 NOTE — Assessment & Plan Note (Signed)
Chronic, ongoing.  Continues every other day statin which has decreased side effects.  Lipid panel up to date.

## 2021-11-09 NOTE — Assessment & Plan Note (Signed)
Chronic, stable with BP at goal today.  Recommend she monitor at least a few times a week at home.  Continue Losartan 25 MG daily and adjust as needed -- she did not tolerate increase in this dose to 50 MG in past and had dizziness.   Focus on DASH diet at home.  Labs today: CBC and BMP. Return to office in 3 months for BP check and reassurance.

## 2021-11-09 NOTE — Assessment & Plan Note (Signed)
Ongoing.  She did not tolerate low dose Levothyroxine in past, but discussed with her may benefit retrial of this.  Educated her on Hashimoto's thyroiditis and effect on thyroid over time.  Recheck TSH and Free T4 today, no current symptoms -- recent labs TSH elevated.  Normal biopsy in past, but multinodular goiter noted on u/s.

## 2021-11-09 NOTE — Progress Notes (Signed)
BP 136/68 (BP Location: Left Arm, Patient Position: Sitting, Cuff Size: Normal)   Pulse 78   Temp 98 F (36.7 C) (Oral)   Resp 18   Ht 5\' 2"  (1.575 m)   Wt 153 lb 9.6 oz (69.7 kg)   LMP  (LMP Unknown)   SpO2 98%   BMI 28.09 kg/m    Subjective:    Patient ID: Carol [name], female    DOB: 26-Nov-1950, 71 y.o.   MRN: [id number]  HPI: Carol [name] is a 71 y.o. female  Chief Complaint  Patient presents with   Hyperlipidemia   Hypertension   HYPERTENSION / HYPERLIPIDEMIA Taking Losartan 25 MG daily for HTN and proteinuria and Lipitor every other day.    History of elevation in A1C, no current symptoms and focused on diet -- recent A1c in June 5.4%. Satisfied with current treatment? yes Duration of hypertension: chronic BP monitoring frequency: not checking BP range: BP medication side effects: no Duration of hyperlipidemia: chronic Cholesterol medication side effects: no Cholesterol supplements: none Medication compliance: good compliance Aspirin: yes Recent stressors: no Recurrent headaches: no Visual changes: no Palpitations: no Dyspnea: no Chest pain: no Lower extremity edema: no Dizzy/lightheaded: no  THYROID ISSUES History of elevation in TSH, but recent levels have remained normal.  Thyroid ultrasound on 10/30/19 -- multinodular goiter noted -- ordered by ENT -- thyroid biopsy which was normal.  Recent TSH was 5.630 and thyroid antibodies elevated. Fatigue: no Cold intolerance: no Heat intolerance: no Weight gain: no Weight loss: no Constipation: no Diarrhea/loose stools: no Palpitations: no Lower extremity edema: no Anxiety/depressed mood: no      11/09/2021    9:28 AM 10/13/2021    9:30 AM 09/01/2021   11:10 AM 08/07/2021   10:13 AM 04/01/2021    9:31 AM  Depression screen PHQ 2/9  Decreased Interest 0 0 0 0 0  Down, Depressed, Hopeless 0 0 0 0 0  PHQ - 2 Score 0 0 0 0 0  Altered sleeping 0  0 0 0  Tired, decreased energy 0  0 0 0  Change in  appetite 0  0 0 0  Feeling bad or failure about yourself  0  0 0 0  Trouble concentrating 0  0 0 0  Moving slowly or fidgety/restless 0  0 0 0  Suicidal thoughts 0  0 0 0  PHQ-9 Score 0  0 0 0  Difficult doing work/chores Not difficult at all  Not difficult at all Not difficult at all Not difficult at all       11/09/2021    9:28 AM 04/01/2021    9:31 AM 02/03/2021   10:49 AM 12/14/2017   10:17 AM  GAD 7 : Generalized Anxiety Score  Nervous, Anxious, on Edge 0 0 0 0  Control/stop worrying 0 0 0 0  Worry too much - different things 0 0 0 0  Trouble relaxing 0 0 0 0  Restless 0 0 0 0  Easily annoyed or irritable 0 0 0 0  Afraid - awful might happen 0 0 0 0  Total GAD 7 Score 0 0 0 0  Anxiety Difficulty Not difficult at all Not difficult at all Not difficult at all       Relevant past medical, surgical, family and social history reviewed and updated as indicated. Interim medical history since our last visit reviewed. Allergies and medications reviewed and updated.  Review of Systems  Constitutional:  Negative for activity change,  appetite change, diaphoresis, fatigue and fever.  Respiratory:  Negative for cough, chest tightness and shortness of breath.   Cardiovascular:  Negative for chest pain, palpitations and leg swelling.  Gastrointestinal: Negative.   Neurological: Negative.   Psychiatric/Behavioral: Negative.      Per HPI unless specifically indicated above     Objective:    BP 136/68 (BP Location: Left Arm, Patient Position: Sitting, Cuff Size: Normal)   Pulse 78   Temp 98 F (36.7 C) (Oral)   Resp 18   Ht 5\' 2"  (1.575 m)   Wt 153 lb 9.6 oz (69.7 kg)   LMP  (LMP Unknown)   SpO2 98%   BMI 28.09 kg/m   Wt Readings from Last 3 Encounters:  11/09/21 153 lb 9.6 oz (69.7 kg)  10/07/21 151 lb 14.4 oz (68.9 kg)  08/07/21 152 lb 9.6 oz (69.2 kg)    Physical Exam Vitals and nursing note reviewed.  Constitutional:      General: She is awake. She is not in acute  distress.    Appearance: She is well-developed and overweight. She is not ill-appearing.  HENT:     Head: Normocephalic.     Right Ear: Hearing, tympanic membrane, ear canal and external ear normal.     Left Ear: Hearing, tympanic membrane, ear canal and external ear normal.  Eyes:     General: Lids are normal.        Right eye: No discharge.        Left eye: No discharge.     Conjunctiva/sclera: Conjunctivae normal.     Pupils: Pupils are equal, round, and reactive to light.  Neck:     Thyroid: Thyromegaly present.     Vascular: No carotid bruit.  Cardiovascular:     Rate and Rhythm: Normal rate and regular rhythm.     Heart sounds: Normal heart sounds. No murmur heard.    No gallop.  Pulmonary:     Effort: Pulmonary effort is normal. No accessory muscle usage or respiratory distress.     Breath sounds: Normal breath sounds.  Abdominal:     General: Bowel sounds are normal.     Palpations: Abdomen is soft.  Musculoskeletal:     Cervical back: Normal range of motion and neck supple.     Right lower leg: No edema.     Left lower leg: No edema.  Lymphadenopathy:     Head:     Right side of head: No submental, submandibular, tonsillar, preauricular or posterior auricular adenopathy.     Left side of head: No submental, submandibular, tonsillar, preauricular or posterior auricular adenopathy.     Cervical: No cervical adenopathy.  Skin:    General: Skin is warm and dry.  Neurological:     Mental Status: She is alert and oriented to person, place, and time.  Psychiatric:        Attention and Perception: Attention normal.        Mood and Affect: Mood normal.        Speech: Speech normal.        Behavior: Behavior normal. Behavior is cooperative.        Thought Content: Thought content normal.    Results for orders placed or performed in visit on 10/07/21  Novel Coronavirus, NAA ([facility])   Specimen: Nasopharyngeal(NP) swabs in vial transport medium  Result Value Ref Range    SARS-CoV-2, NAA Not Detected Not Detected      Assessment & Plan:   Problem  List Items Addressed This Visit       Cardiovascular and Mediastinum   Hypertension - Primary    Chronic, stable with BP at goal today.  Recommend she monitor at least a few times a week at home.  Continue Losartan 25 MG daily and adjust as needed -- she did not tolerate increase in this dose to 50 MG in past and had dizziness.   Focus on DASH diet at home.  Labs today: CBC and BMP. Return to office in 3 months for BP check and reassurance.        Relevant Orders   Basic metabolic panel   CBC with Differential/Platelet     Endocrine   Hashimoto's thyroiditis    Ongoing.  She did not tolerate low dose Levothyroxine in past, but discussed with her may benefit retrial of this.  Educated her on Hashimoto's thyroiditis and effect on thyroid over time.  Recheck TSH and Free T4 today, no current symptoms -- recent labs TSH elevated.  Normal biopsy in past, but multinodular goiter noted on u/s.      Relevant Orders   T4, free   TSH     Other   Hyperlipidemia    Chronic, ongoing.  Continues every other day statin which has decreased side effects.  Lipid panel up to date.      Other Visit Diagnoses     Flu vaccine need       Flu vaccine today.   Relevant Orders   Flu Vaccine QUAD High Dose(Fluad) (Completed)        Follow up plan: Return in about 3 months (around 02/08/2022) for HTN/HLD, THYROID, ELEVATED A1c.

## 2021-11-10 LAB — BASIC METABOLIC PANEL
BUN/Creatinine Ratio: 16 (ref 12–28)
BUN: 15 mg/dL (ref 8–27)
CO2: 25 mmol/L (ref 20–29)
Calcium: 10.1 mg/dL (ref 8.7–10.3)
Chloride: 100 mmol/L (ref 96–106)
Creatinine, Ser: 0.91 mg/dL (ref 0.57–1.00)
Glucose: 95 mg/dL (ref 70–99)
Potassium: 4 mmol/L (ref 3.5–5.2)
Sodium: 141 mmol/L (ref 134–144)
eGFR: 68 mL/min/{1.73_m2} (ref >59)

## 2021-11-10 LAB — CBC WITH DIFFERENTIAL/PLATELET
Basophils Absolute: 0 10*3/uL (ref 0.0–0.2)
Basos: 1 %
EOS (ABSOLUTE): 0.1 10*3/uL (ref 0.0–0.4)
Eos: 3 %
Hematocrit: 38.4 % (ref 34.0–46.6)
Hemoglobin: 12.6 g/dL (ref 11.1–15.9)
Immature Grans (Abs): 0 10*3/uL (ref 0.0–0.1)
Immature Granulocytes: 0 %
Lymphocytes Absolute: 1.5 10*3/uL (ref 0.7–3.1)
Lymphs: 38 %
MCH: 28.8 pg (ref 26.6–33.0)
MCHC: 32.8 g/dL (ref 31.5–35.7)
MCV: 88 fL (ref 79–97)
Monocytes Absolute: 0.5 10*3/uL (ref 0.1–0.9)
Monocytes: 13 %
Neutrophils Absolute: 1.8 10*3/uL (ref 1.4–7.0)
Neutrophils: 45 %
Platelets: 527 10*3/uL — ABNORMAL HIGH (ref 150–450)
RBC: 4.37 x10E6/uL (ref 3.77–5.28)
RDW: 12.1 % (ref 11.7–15.4)
WBC: 4 10*3/uL (ref 3.4–10.8)

## 2021-11-10 LAB — TSH: TSH: 3.8 u[IU]/mL (ref 0.450–4.500)

## 2021-11-10 LAB — T4, FREE: Free T4: 1.08 ng/dL (ref 0.82–1.77)

## 2021-11-10 NOTE — Progress Notes (Signed)
Good morning, please let Carol [name] know her labs have returned: - CBC shows ongoing mild elevation in platelet count, which has been ongoing since 2015.  Please ensure you are taking your chewable Baby Aspirin daily to help this and if any increase in level I may send you to hematology, blood specialist, in future. - Kidney function remains stable with no kidney disease. - Thyroid labs show normal TSH this check, we can stay off medication at this time -- however in future we may start a low dose due to the Hashimoto's and your thyroid inflammation.  I will watch this closely.  Any questions? Keep being amazing!!  Thank you for allowing me to participate in your care.  I appreciate you. Kindest regards, Hessie Varone

## 2021-12-24 DIAGNOSIS — M1711 Unilateral primary osteoarthritis, right knee: Secondary | ICD-10-CM | POA: Diagnosis not present

## 2022-01-13 ENCOUNTER — Ambulatory Visit: Payer: Self-pay | Admitting: *Deleted

## 2022-01-13 NOTE — Patient Outreach (Signed)
  Care Coordination   Follow Up Visit Note   01/13/2022 Name: Carol GUNNOE MRN: [id number] DOB: 1951/01/17  Carol [name] is a 71 y.o. year old female who sees [place], Corrie Dandy T, NP for primary care. I spoke with  Carol [name] by phone today.  What matters to the patients health and wellness today?  Doing well, overall health management is goal.  Denies any urgent concerns, encouraged to contact this care manager with questions.      Goals Addressed             This Visit's Progress    Effective management of BP       Care Coordination Interventions: Evaluation of current treatment plan related to hypertension self management and patient's adherence to plan as established by provider Reviewed medications with patient and discussed importance of compliance Discussed plans with patient for ongoing care management follow up and provided patient with direct contact information for care management team Advised patient, providing education and rationale, to monitor blood pressure daily and record, calling PCP for findings outside established parameters Reviewed scheduled/upcoming provider appointments including:  PCP follow up scheduled for 12/26.  Denies need for transportation assistance Discussed importance of monitoring BP at home several times a week, state PCP told her not to monitor because they usually have to use a manual cuff to obtain an accurate reading.      COMPLETED: RNCM: Effective Management of Cough   On track    Care Coordination Interventions: Evaluation of current treatment plan related to cough and sinus drainage and patient's adherence to plan as established by provider Advised patient to call the office for changes in cough, new onset of sx and sx of infection, questions or concerns Provided education to patient re: monitoring for changes in the color of sputum and to call the office for green or brown color sputum, fever, chills or other questions or concerns  related to chronic conditions Reviewed medications with patient and discussed compliance. The patient states that she could not take the prednisone BID but she took it once a day until the dosage was complete. The patient states that it was keeping her awake at night Reviewed scheduled/upcoming provider appointments including 11-09-2021, reminder given today Discussed plans with patient for ongoing care management follow up and provided patient with direct contact information for care management team Advised patient to discuss changes in conditions, new concerns or questions about health and well being with provider Screening for signs and symptoms of depression related to chronic disease state  Assessed social determinant of health barriers Patient interviewed about adult health maintenance status including  Depression screen    Advised patient to discuss  Blood Pressure    with primary care provider  AWV completed in July    11/29 - no longer having cough, issue has resolved        SDOH assessments and interventions completed:  No     Care Coordination Interventions:  Yes, provided   Follow up plan: Follow up call scheduled for 1/2    Encounter Outcome:  Pt. Visit Completed   Kemper Durie, RN, MSN, [facility] Care Management Care Management Coordinator [phone]

## 2022-01-20 ENCOUNTER — Other Ambulatory Visit: Payer: Self-pay | Admitting: Nurse Practitioner

## 2022-01-20 NOTE — Telephone Encounter (Signed)
Requested Prescriptions  Pending Prescriptions Disp Refills   fluticasone (FLONASE) 50 MCG/ACT nasal spray [Pharmacy Med Name: FLUTICASONE PROPIONATE 50 MCG/ACT N] 16 g 0    Sig: SPRAY 2 SPRAYS INTO EACH NOSTRIL ONCE A DAY     Ear, Nose, and Throat: Nasal Preparations - Corticosteroids Passed - 01/20/2022 11:15 AM      Passed - Valid encounter within last 12 months    Recent Outpatient Visits           2 months ago Primary hypertension   [facility] [place], [place] T, NP   3 months ago Sinus drainage   [facility] [place], Dorie Rank, NP   5 months ago Primary hypertension   [facility] [place], [place] T, NP   8 months ago Primary hypertension   [facility] [place], [place] T, NP   9 months ago Chronic left shoulder pain   [facility] [place], Dorie Rank, NP       Future Appointments             In 2 weeks [place], Dorie Rank, NP [employer], [facility]

## 2022-01-25 ENCOUNTER — Other Ambulatory Visit: Payer: Self-pay | Admitting: Nurse Practitioner

## 2022-01-25 DIAGNOSIS — Z1231 Encounter for screening mammogram for malignant neoplasm of breast: Secondary | ICD-10-CM

## 2022-01-29 ENCOUNTER — Encounter: Payer: Self-pay | Admitting: Nurse Practitioner

## 2022-01-29 ENCOUNTER — Ambulatory Visit (INDEPENDENT_AMBULATORY_CARE_PROVIDER_SITE_OTHER): Payer: Medicare Other | Admitting: Nurse Practitioner

## 2022-01-29 ENCOUNTER — Ambulatory Visit: Payer: Self-pay | Admitting: *Deleted

## 2022-01-29 VITALS — BP 132/72 | HR 89 | Temp 98.0°F | Ht 62.01 in | Wt 156.0 lb

## 2022-01-29 DIAGNOSIS — J301 Allergic rhinitis due to pollen: Secondary | ICD-10-CM | POA: Diagnosis not present

## 2022-01-29 MED ORDER — MONTELUKAST SODIUM 5 MG PO CHEW: 10.0000 mg | CHEWABLE_TABLET | Freq: Every day | ORAL | 2 refills | Status: DC

## 2022-01-29 NOTE — Telephone Encounter (Signed)
Pt scheduled  

## 2022-01-29 NOTE — Telephone Encounter (Signed)
Summary: cold sx   Pt states she is having "head cold".  Runny nose, drainage that cases her to cough.  Pt taking liquid mucinex dm, and leftover benzonatate (TESSALON PERLES) 100 MG capsule From earlier this year.  Pt declined to see any other provider, b/c Jolene doesn't have appt until 12/26.  Pt wants to know if Jolene will call her in something to dry this up.     Reason for Disposition  [1] Sinus congestion as part of a cold AND [2] present < 10 days  Answer Assessment - Initial Assessment Questions 1. LOCATION: "Where does it hurt?"      No pain 2. ONSET: "When did the sinus pain start?"  (e.g., hours, days)      yesterday 3. SEVERITY: "How bad is the pain?"   (Scale 1-10; mild, moderate or severe)   - MILD (1-3): doesn't interfere with normal activities    - MODERATE (4-7): interferes with normal activities (e.g., work or school) or awakens from sleep   - SEVERE (8-10): excruciating pain and patient unable to do any normal activities        No pain 4. RECURRENT SYMPTOM: "Have you ever had sinus problems before?" If Yes, ask: "When was the last time?" and "What happened that time?"      Yes- earlier this year- was treated earlier this year 5. NASAL CONGESTION: "Is the nose blocked?" If Yes, ask: "Can you open it or must you breathe through your mouth?"     Running- not blocked 6. NASAL DISCHARGE: "Do you have discharge from your nose?" If so ask, "What color?"     clear 7. FEVER: "Do you have a fever?" If Yes, ask: "What is it, how was it measured, and when did it start?"      no 8. OTHER SYMPTOMS: "Do you have any other symptoms?" (e.g., sore throat, cough, earache, difficulty breathing)     Cough- mild  Protocols used: Sinus Pain or Congestion-A-AH

## 2022-01-29 NOTE — Assessment & Plan Note (Signed)
Chronic, ongoing with current worsening and no benefit from multiple OTC medications.  Discussed Singulair with patient -- educated her on medication + use and current BLACK BOX warning.  She would like to trial this.  Will send in script for chewable tablets which is easier for her, recommend she try starting out with one at night (5 MG) and then if she tolerates increase to 2 at night (10 MG) -- she verbalized this back to provider.  Continue Claritin, Flonase, and Tessalon for now and consider stopping Claritin in future if benefit from Singulair without ADR.

## 2022-01-29 NOTE — Telephone Encounter (Signed)
  Chief Complaint: medication request- sinus drainage Symptoms: runny nose, cough Frequency: started yesterday Pertinent Negatives: Patient denies sore throat, cough, earache, difficulty breathing, fever Disposition: [] ED /[] Urgent Care (no appt availability in office) / [] Appointment(In office/virtual)/ []  [facility]/ [x] Home Care/ [] Refused Recommended Disposition /[] [place] [facility]/ []  Follow-up with PCP Additional Notes: Patient is requesting medication from provider- she states she was treated earlier this year for same problem- she is using benzonanate which is helping with cough. Patient does not want to be sick at Christmas and wants medication.  Patient reassure she is treating symptoms correctly- will send her request to PCP.

## 2022-01-29 NOTE — Progress Notes (Signed)
BP 132/72 (BP Location: Left Arm)   Pulse 89   Temp 98 F (36.7 C) (Oral)   Ht 5' 2.01" (1.575 m)   Wt 156 lb (70.8 kg)   LMP  (LMP Unknown)   SpO2 96%   BMI 28.53 kg/m    Subjective:    Patient ID: Carol [name], female    DOB: 1950/04/17, 71 y.o.   MRN: [id number]  HPI: Carol [name] is a 71 y.o. female  Chief Complaint  Patient presents with   Nasal Congestion   ALLERGIES Has tried several OTC allergy medications without benefit.  Currently Claritin and Flonase with more congestion at night and itching of nose + sneezing.   Duration: months Runny nose: yes "clear Nasal congestion: yes Nasal itching: yes Sneezing: yes Eye swelling, itching or discharge: no Post nasal drip: yes Cough:  occasional Sinus pressure: no  Ear pain: none Ear pressure: none Fever: none Symptoms occur seasonally:no Symptoms occur perenially: yes Satisfied with current treatment: no Allergist evaluation in past: no Allergen injection immunotherapy: no Recurrent sinus infections: no ENT evaluation in past: no Known environmental allergy: no Indoor pets: no History of asthma: no Current allergy medications: Claritin, Flonase, Tessalon Treatments attempted: as above plus multiple OTC medications  Relevant past medical, surgical, family and social history reviewed and updated as indicated. Interim medical history since our last visit reviewed. Allergies and medications reviewed and updated.  Review of Systems  Constitutional:  Negative for activity change, appetite change, diaphoresis, fatigue and fever.  HENT:  Positive for congestion, postnasal drip, rhinorrhea and sneezing. Negative for ear discharge, ear pain, facial swelling, hearing loss, sinus pressure, sinus pain and sore throat.   Respiratory:  Negative for cough, chest tightness and shortness of breath.   Cardiovascular:  Negative for chest pain, palpitations and leg swelling.  Gastrointestinal: Negative.   Neurological:  Negative.   Psychiatric/Behavioral: Negative.      Per HPI unless specifically indicated above     Objective:    BP 132/72 (BP Location: Left Arm)   Pulse 89   Temp 98 F (36.7 C) (Oral)   Ht 5' 2.01" (1.575 m)   Wt 156 lb (70.8 kg)   LMP  (LMP Unknown)   SpO2 96%   BMI 28.53 kg/m   Wt Readings from Last 3 Encounters:  01/29/22 156 lb (70.8 kg)  11/09/21 153 lb 9.6 oz (69.7 kg)  10/07/21 151 lb 14.4 oz (68.9 kg)    Physical Exam Vitals and nursing note reviewed.  Constitutional:      General: She is awake. She is not in acute distress.    Appearance: She is well-developed and overweight. She is not ill-appearing.  HENT:     Head: Normocephalic.     Right Ear: Hearing, tympanic membrane, ear canal and external ear normal.     Left Ear: Hearing, tympanic membrane, ear canal and external ear normal.     Nose: Rhinorrhea present. Rhinorrhea is clear.     Right Sinus: No maxillary sinus tenderness or frontal sinus tenderness.     Left Sinus: No maxillary sinus tenderness or frontal sinus tenderness.     Mouth/Throat:     Mouth: Mucous membranes are moist.     Pharynx: No pharyngeal swelling, oropharyngeal exudate or posterior oropharyngeal erythema.  Eyes:     General: Lids are normal.        Right eye: No discharge.        Left eye: No discharge.  Conjunctiva/sclera: Conjunctivae normal.     Pupils: Pupils are equal, round, and reactive to light.  Neck:     Vascular: No carotid bruit.  Cardiovascular:     Rate and Rhythm: Normal rate and regular rhythm.     Heart sounds: Normal heart sounds. No murmur heard.    No gallop.  Pulmonary:     Effort: Pulmonary effort is normal. No accessory muscle usage or respiratory distress.     Breath sounds: Normal breath sounds.  Abdominal:     General: Bowel sounds are normal.     Palpations: Abdomen is soft.  Musculoskeletal:     Cervical back: Normal range of motion and neck supple.     Right lower leg: No edema.      Left lower leg: No edema.  Lymphadenopathy:     Head:     Right side of head: No submental, submandibular, tonsillar, preauricular or posterior auricular adenopathy.     Left side of head: No submental, submandibular, tonsillar, preauricular or posterior auricular adenopathy.     Cervical: No cervical adenopathy.  Skin:    General: Skin is warm and dry.  Neurological:     Mental Status: She is alert and oriented to person, place, and time.  Psychiatric:        Attention and Perception: Attention normal.        Mood and Affect: Mood normal.        Speech: Speech normal.        Behavior: Behavior normal. Behavior is cooperative.        Thought Content: Thought content normal.     Results for orders placed or performed in visit on 86/76/19  Basic metabolic panel  Result Value Ref Range   Glucose 95 70 - 99 mg/dL   BUN 15 8 - 27 mg/dL   Creatinine, Ser 0.91 0.57 - 1.00 mg/dL   eGFR 68 >59 mL/min/1.73   BUN/Creatinine Ratio 16 12 - 28   Sodium 141 134 - 144 mmol/L   Potassium 4.0 3.5 - 5.2 mmol/L   Chloride 100 96 - 106 mmol/L   CO2 25 20 - 29 mmol/L   Calcium 10.1 8.7 - 10.3 mg/dL  CBC with Differential/Platelet  Result Value Ref Range   WBC 4.0 3.4 - 10.8 x10E3/uL   RBC 4.37 3.77 - 5.28 x10E6/uL   Hemoglobin 12.6 11.1 - 15.9 g/dL   Hematocrit 38.4 34.0 - 46.6 %   MCV 88 79 - 97 fL   MCH 28.8 26.6 - 33.0 pg   MCHC 32.8 31.5 - 35.7 g/dL   RDW 12.1 11.7 - 15.4 %   Platelets 527 (H) 150 - 450 x10E3/uL   Neutrophils 45 Not Estab. %   Lymphs 38 Not Estab. %   Monocytes 13 Not Estab. %   Eos 3 Not Estab. %   Basos 1 Not Estab. %   Neutrophils Absolute 1.8 1.4 - 7.0 x10E3/uL   Lymphocytes Absolute 1.5 0.7 - 3.1 x10E3/uL   Monocytes Absolute 0.5 0.1 - 0.9 x10E3/uL   EOS (ABSOLUTE) 0.1 0.0 - 0.4 x10E3/uL   Basophils Absolute 0.0 0.0 - 0.2 x10E3/uL   Immature Granulocytes 0 Not Estab. %   Immature Grans (Abs) 0.0 0.0 - 0.1 x10E3/uL  T4, free  Result Value Ref Range   Free  T4 1.08 0.82 - 1.77 ng/dL  TSH  Result Value Ref Range   TSH 3.800 0.450 - 4.500 uIU/mL      Assessment & Plan:  Problem List Items Addressed This Visit       Respiratory   Allergic rhinitis - Primary    Chronic, ongoing with current worsening and no benefit from multiple OTC medications.  Discussed Singulair with patient -- educated her on medication + use and current BLACK BOX warning.  She would like to trial this.  Will send in script for chewable tablets which is easier for her, recommend she try starting out with one at night (5 MG) and then if she tolerates increase to 2 at night (10 MG) -- she verbalized this back to provider.  Continue Claritin, Flonase, and Tessalon for now and consider stopping Claritin in future if benefit from Singulair without ADR.        Follow up plan: Return for as scheduled in future.

## 2022-01-29 NOTE — Patient Instructions (Signed)
Allergic Rhinitis, Adult  Allergic rhinitis is an allergic reaction that affects the mucous membrane inside the nose. The mucous membrane is the tissue that produces mucus. There are two types of allergic rhinitis: Seasonal. This type is also called hay fever and happens only during certain seasons. Perennial. This type can happen at any time of the year. Allergic rhinitis cannot be spread from person to person. This condition can be mild, moderate, or severe. It can develop at any age and may be outgrown. What are the causes? This condition is caused by allergens. These are things that can cause an allergic reaction. Allergens may differ for seasonal allergic rhinitis and perennial allergic rhinitis. Seasonal allergic rhinitis is triggered by pollen. Pollen can come from grasses, trees, and weeds. Perennial allergic rhinitis may be triggered by: Dust mites. Proteins in a pet's urine, saliva, or dander. Dander is dead skin cells from a pet. Smoke, mold, or car fumes. What increases the risk? You are more likely to develop this condition if you have a family history of allergies or other conditions related to allergies, including: Allergic conjunctivitis. This is inflammation of parts of the eyes and eyelids. Asthma. This condition affects the lungs and makes it hard to breathe. Atopic dermatitis or eczema. This is long term (chronic) inflammation of the skin. Food allergies. What are the signs or symptoms? Symptoms of this condition include: Sneezing or coughing. A stuffy nose (nasal congestion), itchy nose, or nasal discharge. Itchy eyes and tearing of the eyes. A feeling of mucus dripping down the back of your throat (postnasal drip). Trouble sleeping. Tiredness or fatigue. Headache. Sore throat. How is this diagnosed? This condition may be diagnosed with your symptoms, medical history, and physical exam. Your health care provider may check for related conditions, such  as: Asthma. Pink eye. This is eye inflammation caused by infection (conjunctivitis). Ear infection. Upper respiratory infection. This is an infection in the nose, throat, or upper airways. You may also have tests to find out which allergens trigger your symptoms. These may include skin tests or blood tests. How is this treated? There is no cure for this condition, but treatment can help control symptoms. Treatment may include: Taking medicines that block allergy symptoms, such as corticosteroids and antihistamines. Medicine may be given as a shot, nasal spray, or pill. Avoiding any allergens. Being exposed again and again to tiny amounts of allergens to help you build a defense against allergens (immunotherapy). This is done if other treatments have not helped. It may include: Allergy shots. These are injected medicines that have small amounts of allergen in them. Sublingual immunotherapy. This involves taking small doses of a medicine with allergen in it under your tongue. If these treatments do not work, your health care provider may prescribe newer, stronger medicines. Follow these instructions at home: Avoiding allergens Find out what you are allergic to and avoid those allergens. These are some things you can do to help avoid allergens: If you have perennial allergies: Replace carpet with wood, tile, or vinyl flooring. Carpet can trap dander and dust. Do not smoke. Do not allow smoking in your home. Change your heating and air conditioning filters at least once a month. If you have seasonal allergies, take these steps during allergy season: Keep windows closed as much as possible. Plan outdoor activities when pollen counts are lowest. Check pollen counts before you plan outdoor activities. When coming indoors, change clothing and shower before sitting on furniture or bedding. If you have a pet   in the house that produces allergens: Keep the pet out of the bedroom. Vacuum, sweep, and  dust regularly. General instructions Take over-the-counter and prescription medicines only as told by your health care provider. Drink enough fluid to keep your urine pale yellow. Keep all follow-up visits as told by your health care provider. This is important. Where to find more information American Academy of Allergy, Asthma & Immunology: www.aaaai.org Contact a health care provider if: You have a fever. You develop a cough that does not go away. You make whistling sounds when you breathe (wheeze). Your symptoms slow you down or stop you from doing your normal activities each day. Get help right away if: You have shortness of breath. This symptom may represent a serious problem that is an emergency. Do not wait to see if the symptom will go away. Get medical help right away. Call your local emergency services (911 in the [place] Do not drive yourself to the hospital. Summary Allergic rhinitis may be managed by taking medicines as directed and avoiding allergens. If you have seasonal allergies, keep windows closed as much as possible during allergy season. Contact your health care provider if you develop a fever or a cough that does not go away. This information is not intended to replace advice given to you by your health care provider. Make sure you discuss any questions you have with your health care provider. Document Revised: 07/16/2021 Document Reviewed: 01/30/2019 Elsevier Patient Education  2023 [employer] Inc.  

## 2022-02-01 MED ORDER — MONTELUKAST SODIUM 5 MG PO CHEW: 5.0000 mg | CHEWABLE_TABLET | Freq: Every day | ORAL | 2 refills | Status: DC

## 2022-02-01 NOTE — Addendum Note (Signed)
Addended by: Aura Dials T on: 02/01/2022 01:37 PM   Modules accepted: Orders

## 2022-02-07 NOTE — Patient Instructions (Signed)

## 2022-02-09 ENCOUNTER — Ambulatory Visit (INDEPENDENT_AMBULATORY_CARE_PROVIDER_SITE_OTHER): Payer: Medicare Other | Admitting: Nurse Practitioner

## 2022-02-09 ENCOUNTER — Encounter: Payer: Self-pay | Admitting: Nurse Practitioner

## 2022-02-09 VITALS — BP 128/78 | HR 96 | Temp 97.9°F | Ht 62.01 in | Wt 155.6 lb

## 2022-02-09 DIAGNOSIS — R051 Acute cough: Secondary | ICD-10-CM

## 2022-02-09 DIAGNOSIS — M79672 Pain in left foot: Secondary | ICD-10-CM | POA: Diagnosis not present

## 2022-02-09 DIAGNOSIS — R7309 Other abnormal glucose: Secondary | ICD-10-CM

## 2022-02-09 DIAGNOSIS — E063 Autoimmune thyroiditis: Secondary | ICD-10-CM | POA: Diagnosis not present

## 2022-02-09 DIAGNOSIS — E782 Mixed hyperlipidemia: Secondary | ICD-10-CM

## 2022-02-09 DIAGNOSIS — I10 Essential (primary) hypertension: Secondary | ICD-10-CM | POA: Diagnosis not present

## 2022-02-09 MED ORDER — AZITHROMYCIN 250 MG PO TABS: ORAL_TABLET | ORAL | 0 refills | Status: AC

## 2022-02-09 MED ORDER — LOSARTAN POTASSIUM 50 MG PO TABS: ORAL_TABLET | ORAL | 4 refills | Status: DC

## 2022-02-09 MED ORDER — HYDROCOD POLI-CHLORPHE POLI ER 10-8 MG/5ML PO SUER: 5.0000 mL | Freq: Every evening | ORAL | 0 refills | Status: DC | PRN

## 2022-02-09 MED ORDER — ATORVASTATIN CALCIUM 10 MG PO TABS: ORAL_TABLET | ORAL | 4 refills | Status: DC

## 2022-02-09 NOTE — Assessment & Plan Note (Signed)
Acute cough ongoing for over a week with no improvement with her allergy medications.  At this time will start Zpack and Tussionex.  Continue allergy medication regimen at this time.  Recommend: - Increased rest - Increasing Fluids - Acetaminophen as needed for fever/pain.  - Salt water gargling, chloraseptic spray and throat lozenges - Mucinex.  - Humidifying the air.

## 2022-02-09 NOTE — Assessment & Plan Note (Signed)
Chronic, stable.  BP at goal today.  Recommend she monitor at least a few times a week at home.  Continue Losartan 25 MG daily and adjust as needed -- she did not tolerate increase in this dose to 50 MG in past and had dizziness.  Focus on DASH diet at home.  Labs today: BMP. Return to office in 3 months for BP check and reassurance.

## 2022-02-09 NOTE — Assessment & Plan Note (Signed)
Ongoing.  She did not tolerate low dose Levothyroxine in past, but discussed with her may benefit retrial of this.  Educated her on Hashimoto's thyroiditis and effect on thyroid over time.  Recheck TSH and Free T4 today, no current symptoms -- recent labs TSH stable.  Normal biopsy in past, but multinodular goiter noted on u/s.

## 2022-02-09 NOTE — Progress Notes (Signed)
BP 128/78 (BP Location: Left Arm, Patient Position: Sitting, Cuff Size: Normal)   Pulse 96   Temp 97.9 F (36.6 C) (Oral)   Ht 5' 2.01" (1.575 m)   Wt 155 lb 9.6 oz (70.6 kg)   LMP  (LMP Unknown)   SpO2 97%   BMI 28.45 kg/m    Subjective:    Patient ID: Carol [name], female    DOB: 04/01/1950, 71 y.o.   MRN: [id number]  HPI: Carol [name] is a 71 y.o. female  Chief Complaint  Patient presents with   Hypertension   Thyroid Problem   Is having foot pain left side and would like referral to podiatry.  Has seen in past for fracture to this foot.  Now having pain to medial aspect.  HYPERTENSION / HYPERLIPIDEMIA Taking Losartan 25 MG daily for HTN and proteinuria and Lipitor every other day.    A1c elevation past labs, no current symptoms and focused on diet -- recent A1c in June 5.4%.  She reports continuing to have cough for over a week, no benefit from allergy medications provided -- would like something to help since it is ongoing.  Currently taking Singulair and Claritin.   Satisfied with current treatment? yes Duration of hypertension: chronic BP monitoring frequency: not checking BP range: BP medication side effects: no Duration of hyperlipidemia: chronic Cholesterol medication side effects: no Cholesterol supplements: none Medication compliance: good compliance Aspirin: yes Recent stressors: no Recurrent headaches: no Visual changes: no Palpitations: no Dyspnea: no Chest pain: no Lower extremity edema: no Dizzy/lightheaded: no  HASHIMOTO'S THYROID History of elevation in TSH, but recent levels have remained normal.  Thyroid ultrasound 10/30/19 -- multinodular goiter noted -- ENT ordered -- thyroid biopsy at time which was normal.  Recent TSH was 3.800.  In past she took a low dose Levothyroxine and reported side effects. Fatigue: no Cold intolerance: no Heat intolerance: no Weight gain: no Weight loss: no Constipation: no Diarrhea/loose stools:  no Palpitations: no Lower extremity edema: no Anxiety/depressed mood: no      02/09/2022   12:02 PM 11/09/2021    9:28 AM 10/13/2021    9:30 AM 09/01/2021   11:10 AM 08/07/2021   10:13 AM  Depression screen PHQ 2/9  Decreased Interest 0 0 0 0 0  Down, Depressed, Hopeless 0 0 0 0 0  PHQ - 2 Score 0 0 0 0 0  Altered sleeping 0 0  0 0  Tired, decreased energy 0 0  0 0  Change in appetite 0 0  0 0  Feeling bad or failure about yourself  0 0  0 0  Trouble concentrating 0 0  0 0  Moving slowly or fidgety/restless 0 0  0 0  Suicidal thoughts 0 0  0 0  PHQ-9 Score 0 0  0 0  Difficult doing work/chores Not difficult at all Not difficult at all  Not difficult at all Not difficult at all       02/09/2022   12:02 PM 11/09/2021    9:28 AM 04/01/2021    9:31 AM 02/03/2021   10:49 AM  GAD 7 : Generalized Anxiety Score  Nervous, Anxious, on Edge 0 0 0 0  Control/stop worrying 0 0 0 0  Worry too much - different things 0 0 0 0  Trouble relaxing 0 0 0 0  Restless 0 0 0 0  Easily annoyed or irritable 0 0 0 0  Afraid - awful might happen 0 0 0  0  Total GAD 7 Score 0 0 0 0  Anxiety Difficulty Not difficult at all Not difficult at all Not difficult at all Not difficult at all    Relevant past medical, surgical, family and social history reviewed and updated as indicated. Interim medical history since our last visit reviewed. Allergies and medications reviewed and updated.  Review of Systems  Constitutional:  Negative for activity change, appetite change, diaphoresis, fatigue and fever.  Respiratory:  Negative for cough, chest tightness and shortness of breath.   Cardiovascular:  Negative for chest pain, palpitations and leg swelling.  Gastrointestinal: Negative.   Neurological: Negative.   Psychiatric/Behavioral: Negative.      Per HPI unless specifically indicated above     Objective:    BP 128/78 (BP Location: Left Arm, Patient Position: Sitting, Cuff Size: Normal)   Pulse 96    Temp 97.9 F (36.6 C) (Oral)   Ht 5' 2.01" (1.575 m)   Wt 155 lb 9.6 oz (70.6 kg)   LMP  (LMP Unknown)   SpO2 97%   BMI 28.45 kg/m   Wt Readings from Last 3 Encounters:  02/09/22 155 lb 9.6 oz (70.6 kg)  01/29/22 156 lb (70.8 kg)  11/09/21 153 lb 9.6 oz (69.7 kg)    Physical Exam Vitals and nursing note reviewed.  Constitutional:      General: She is awake. She is not in acute distress.    Appearance: She is well-developed and overweight. She is not ill-appearing.  HENT:     Head: Normocephalic.     Right Ear: Hearing, tympanic membrane, ear canal and external ear normal.     Left Ear: Hearing, tympanic membrane, ear canal and external ear normal.  Eyes:     General: Lids are normal.        Right eye: No discharge.        Left eye: No discharge.     Conjunctiva/sclera: Conjunctivae normal.     Pupils: Pupils are equal, round, and reactive to light.  Neck:     Thyroid: Thyromegaly present.     Vascular: No carotid bruit.  Cardiovascular:     Rate and Rhythm: Normal rate and regular rhythm.     Pulses:          Dorsalis pedis pulses are 2+ on the right side and 2+ on the left side.       Posterior tibial pulses are 2+ on the right side and 2+ on the left side.     Heart sounds: Normal heart sounds. No murmur heard.    No gallop.  Pulmonary:     Effort: Pulmonary effort is normal. No accessory muscle usage or respiratory distress.     Breath sounds: Normal breath sounds.  Abdominal:     General: Bowel sounds are normal.     Palpations: Abdomen is soft.  Musculoskeletal:     Cervical back: Normal range of motion and neck supple.     Right lower leg: No edema.     Left lower leg: No edema.     Right foot: Normal range of motion.     Left foot: Normal range of motion.       Feet:  Feet:     Right foot:     Protective Sensation: 10 sites tested.  10 sites sensed.     Skin integrity: Skin integrity normal.     Toenail Condition: Right toenails are normal.     Left  foot:  Protective Sensation: 10 sites tested.  10 sites sensed.     Skin integrity: Skin integrity normal.     Toenail Condition: Left toenails are normal.  Lymphadenopathy:     Head:     Right side of head: No submental, submandibular, tonsillar, preauricular or posterior auricular adenopathy.     Left side of head: No submental, submandibular, tonsillar, preauricular or posterior auricular adenopathy.     Cervical: No cervical adenopathy.  Skin:    General: Skin is warm and dry.  Neurological:     Mental Status: She is alert and oriented to person, place, and time.  Psychiatric:        Attention and Perception: Attention normal.        Mood and Affect: Mood normal.        Speech: Speech normal.        Behavior: Behavior normal. Behavior is cooperative.        Thought Content: Thought content normal.    Results for orders placed or performed in visit on 67/61/95  Basic metabolic panel  Result Value Ref Range   Glucose 95 70 - 99 mg/dL   BUN 15 8 - 27 mg/dL   Creatinine, Ser 0.91 0.57 - 1.00 mg/dL   eGFR 68 >59 mL/min/1.73   BUN/Creatinine Ratio 16 12 - 28   Sodium 141 134 - 144 mmol/L   Potassium 4.0 3.5 - 5.2 mmol/L   Chloride 100 96 - 106 mmol/L   CO2 25 20 - 29 mmol/L   Calcium 10.1 8.7 - 10.3 mg/dL  CBC with Differential/Platelet  Result Value Ref Range   WBC 4.0 3.4 - 10.8 x10E3/uL   RBC 4.37 3.77 - 5.28 x10E6/uL   Hemoglobin 12.6 11.1 - 15.9 g/dL   Hematocrit 38.4 34.0 - 46.6 %   MCV 88 79 - 97 fL   MCH 28.8 26.6 - 33.0 pg   MCHC 32.8 31.5 - 35.7 g/dL   RDW 12.1 11.7 - 15.4 %   Platelets 527 (H) 150 - 450 x10E3/uL   Neutrophils 45 Not Estab. %   Lymphs 38 Not Estab. %   Monocytes 13 Not Estab. %   Eos 3 Not Estab. %   Basos 1 Not Estab. %   Neutrophils Absolute 1.8 1.4 - 7.0 x10E3/uL   Lymphocytes Absolute 1.5 0.7 - 3.1 x10E3/uL   Monocytes Absolute 0.5 0.1 - 0.9 x10E3/uL   EOS (ABSOLUTE) 0.1 0.0 - 0.4 x10E3/uL   Basophils Absolute 0.0 0.0 - 0.2  x10E3/uL   Immature Granulocytes 0 Not Estab. %   Immature Grans (Abs) 0.0 0.0 - 0.1 x10E3/uL  T4, free  Result Value Ref Range   Free T4 1.08 0.82 - 1.77 ng/dL  TSH  Result Value Ref Range   TSH 3.800 0.450 - 4.500 uIU/mL      Assessment & Plan:   Problem List Items Addressed This Visit       Cardiovascular and Mediastinum   Hypertension - Primary    Chronic, stable.  BP at goal today.  Recommend she monitor at least a few times a week at home.  Continue Losartan 25 MG daily and adjust as needed -- she did not tolerate increase in this dose to 50 MG in past and had dizziness.  Focus on DASH diet at home.  Labs today: BMP. Return to office in 3 months for BP check and reassurance.        Relevant Medications   atorvastatin (LIPITOR) 10 MG tablet  losartan (COZAAR) 50 MG tablet   Other Relevant Orders   Basic metabolic panel     Endocrine   Hashimoto's thyroiditis    Ongoing.  She did not tolerate low dose Levothyroxine in past, but discussed with her may benefit retrial of this.  Educated her on Hashimoto's thyroiditis and effect on thyroid over time.  Recheck TSH and Free T4 today, no current symptoms -- recent labs TSH stable.  Normal biopsy in past, but multinodular goiter noted on u/s.      Relevant Orders   T4, free   TSH     Other   Acute cough    Acute cough ongoing for over a week with no improvement with her allergy medications.  At this time will start Zpack and Tussionex.  Continue allergy medication regimen at this time.  Recommend: - Increased rest - Increasing Fluids - Acetaminophen as needed for fever/pain.  - Salt water gargling, chloraseptic spray and throat lozenges - Mucinex.  - Humidifying the air.       Elevated hemoglobin A1c    A1c with trend down today to 5.4%.  Praised her for this.  Continue diet focus at home.      Relevant Orders   HgB A1c   Hyperlipidemia    Chronic, ongoing.  Continues every other day statin which has decreased side  effects.  Lipid panel today.      Relevant Medications   atorvastatin (LIPITOR) 10 MG tablet   losartan (COZAAR) 50 MG tablet   Other Relevant Orders   Lipid Panel w/o Chol/HDL Ratio   Other Visit Diagnoses     Left foot pain       Referral to podiatry placed per request.   Relevant Orders   Ambulatory referral to Podiatry        Follow up plan: Return in about 3 months (around 05/11/2022) for HTN/HLD, THYROID, PROTEINURIA.

## 2022-02-09 NOTE — Assessment & Plan Note (Signed)
Chronic, ongoing.  Continues every other day statin which has decreased side effects.  Lipid panel today. 

## 2022-02-09 NOTE — Assessment & Plan Note (Signed)
A1c with trend down today to 5.4%.  Praised her for this.  Continue diet focus at home.

## 2022-02-10 LAB — HEMOGLOBIN A1C
Est. average glucose Bld gHb Est-mCnc: 134 mg/dL
Hgb A1c MFr Bld: 6.3 % — ABNORMAL HIGH (ref 4.8–5.6)

## 2022-02-10 LAB — BASIC METABOLIC PANEL
BUN/Creatinine Ratio: 13 (ref 12–28)
BUN: 11 mg/dL (ref 8–27)
CO2: 24 mmol/L (ref 20–29)
Calcium: 9.8 mg/dL (ref 8.7–10.3)
Chloride: 99 mmol/L (ref 96–106)
Creatinine, Ser: 0.84 mg/dL (ref 0.57–1.00)
Glucose: 89 mg/dL (ref 70–99)
Potassium: 3.8 mmol/L (ref 3.5–5.2)
Sodium: 139 mmol/L (ref 134–144)
eGFR: 75 mL/min/{1.73_m2} (ref >59)

## 2022-02-10 LAB — LIPID PANEL W/O CHOL/HDL RATIO
Cholesterol, Total: 161 mg/dL (ref 100–199)
HDL: 50 mg/dL (ref >39)
LDL Chol Calc (NIH): 94 mg/dL (ref 0–99)
Triglycerides: 92 mg/dL (ref 0–149)
VLDL Cholesterol Cal: 17 mg/dL (ref 5–40)

## 2022-02-10 LAB — TSH: TSH: 3.34 u[IU]/mL (ref 0.450–4.500)

## 2022-02-10 LAB — T4, FREE: Free T4: 1.07 ng/dL (ref 0.82–1.77)

## 2022-02-10 NOTE — Progress Notes (Signed)
Good afternoon, please let Carol [name] know her A1c remains in prediabetic range and crept up a little from 5.9% to 6.3% -- focus heavily on diet changes at home.  Thyroid labs remain stable, we will monitor and start medication as needed.  Any questions? Keep being amazing!!  Thank you for allowing me to participate in your care.  I appreciate you. Kindest regards, Aerionna Moravek

## 2022-02-16 ENCOUNTER — Ambulatory Visit: Payer: Self-pay | Admitting: *Deleted

## 2022-02-16 NOTE — Patient Outreach (Addendum)
  Care Coordination   Follow Up Visit Note   02/16/2022 Name: LILIAN FUHS MRN: [id number] DOB: 06/05/1950  [name] is a 72 y.o. year old female who sees [place], Henrine Screws T, NP for primary care. I spoke with  [name] by phone today.  What matters to the patients health and wellness today?  Cough had gotten better, returned a couple weeks prior to Christmas.    Goals Addressed             This Visit's Progress    Effective management of BP   On track    Care Coordination Interventions: Evaluation of current treatment plan related to hypertension self management and patient's adherence to plan as established by provider Reviewed medications with patient and discussed importance of compliance Discussed plans with patient for ongoing care management follow up and provided patient with direct contact information for care management team Advised patient, providing education and rationale, to monitor blood pressure daily and record, calling PCP for findings outside established parameters Reviewed scheduled/upcoming provider appointments including:  Podiatry 1/8, mammogram 1/12, PCP follow up scheduled for 3/26.  Denies need for transportation assistance Discussed importance of monitoring BP at home several times a week. Re-educated on proper DASH and diabetic diets in effort to keep blood pressure and A1C controlled      RNCM: Effective Management of Cough       Care Coordination Interventions: Evaluation of current treatment plan related to cough and sinus drainage that has returned and patient's adherence to plan as established by provider Advised patient to call the office for changes in cough, new onset of sx and sx of infection, questions or concerns Provided education to patient re: monitoring for changes in the color of sputum and to call the office for green or brown color sputum, fever, chills or other questions or concerns related to chronic conditions Reviewed  medications with patient and discussed compliance. Z-pack completed, also has Tussionex and Singulair Reviewed scheduled/upcoming provider appointments including 05/11/2022, reminder given today Discussed plans with patient for ongoing care management follow up and provided patient with direct contact information for care management team            SDOH assessments and interventions completed:  No     Care Coordination Interventions:  Yes, provided   Follow up plan: Follow up call scheduled for 3/28    Encounter Outcome:  Pt. Visit Completed   Valente David, RN, MSN, [place] Care Management Care Management Coordinator [phone]

## 2022-02-22 ENCOUNTER — Encounter: Payer: Self-pay | Admitting: Podiatry

## 2022-02-22 ENCOUNTER — Ambulatory Visit (INDEPENDENT_AMBULATORY_CARE_PROVIDER_SITE_OTHER): Payer: Medicare Other

## 2022-02-22 ENCOUNTER — Ambulatory Visit: Payer: Medicare Other | Admitting: Podiatry

## 2022-02-22 DIAGNOSIS — M19072 Primary osteoarthritis, left ankle and foot: Secondary | ICD-10-CM | POA: Diagnosis not present

## 2022-02-22 DIAGNOSIS — G5792 Unspecified mononeuropathy of left lower limb: Secondary | ICD-10-CM

## 2022-02-22 DIAGNOSIS — R252 Cramp and spasm: Secondary | ICD-10-CM

## 2022-02-22 NOTE — Progress Notes (Signed)
  Subjective:  Patient ID: Carol [name], female    DOB: [birth year],  MRN: [id number]  Chief Complaint  Patient presents with   Foot Pain    np left foot pain, bunion    72 y.o. female presents with the above complaint. History confirmed with patient.  She feels a cramping burning pain and pulse through her arch near the bunion  Objective:  Physical Exam: warm, good capillary refill, no trophic changes or ulcerative lesions, normal DP and PT pulses, and normal sensory exam. Left Foot:  Hallux valgus deformity pain on the abductor hallucis, some pain and range of motion of joint    Radiographs: Multiple views x-ray of the left foot:  Hallux valgus deformity with degenerative changes in the metatarsophalangeal joint noted Assessment:   1. Arthritis of foot, left   2. Muscle cramp   3. Neuritis of left foot      Plan:  Patient was evaluated and treated and all questions answered.  Discussed etiology treatment options of her pain and how it relates to her bunion deformity.  We discussed the osteoarthritis as well.  Currently she does not feel it is purely painful enough to consider surgery or injections and disorder to make sure that nothing serious was going on.  I recommended topical treatment with diclofenac gel.  She will follow-up as needed if this changes or worsens Return if symptoms worsen or fail to improve.

## 2022-02-22 NOTE — Patient Instructions (Signed)
Look for Voltaren gel at the pharmacy over the counter or online (also known as diclofenac 1% gel). Apply to the painful areas 3-4x daily with the supplied dosing card. Allow to dry for 10 minutes before going into socks/shoes   Avoid shoes that tie tightly across the top of the foot

## 2022-02-26 ENCOUNTER — Ambulatory Visit
Admission: RE | Admit: 2022-02-26 | Discharge: 2022-02-26 | Disposition: A | Discharge status: Home / Self Care | Payer: Medicare Other | Source: Ambulatory Visit | Attending: Nurse Practitioner | Admitting: Nurse Practitioner

## 2022-02-26 DIAGNOSIS — Z1231 Encounter for screening mammogram for malignant neoplasm of breast: Secondary | ICD-10-CM | POA: Insufficient documentation

## 2022-04-16 ENCOUNTER — Other Ambulatory Visit: Payer: Self-pay

## 2022-04-16 MED ORDER — MELOXICAM 15 MG PO TABS: 15.0000 mg | ORAL_TABLET | Freq: Every day | ORAL | 4 refills | Status: DC

## 2022-04-16 NOTE — Telephone Encounter (Signed)
Medication refill for Meloxicam 15 mg last ov 02/09/22, upcoming ov 05/11/22 . Please advise

## 2022-05-08 NOTE — Patient Instructions (Signed)

## 2022-05-11 ENCOUNTER — Encounter: Payer: Self-pay | Admitting: Nurse Practitioner

## 2022-05-11 ENCOUNTER — Ambulatory Visit (INDEPENDENT_AMBULATORY_CARE_PROVIDER_SITE_OTHER): Payer: Medicare Other | Admitting: Nurse Practitioner

## 2022-05-11 VITALS — BP 122/80 | HR 76 | Temp 97.7°F | Ht 62.01 in | Wt 157.9 lb

## 2022-05-11 DIAGNOSIS — R801 Persistent proteinuria, unspecified: Secondary | ICD-10-CM | POA: Diagnosis not present

## 2022-05-11 DIAGNOSIS — R748 Abnormal levels of other serum enzymes: Secondary | ICD-10-CM

## 2022-05-11 DIAGNOSIS — R7309 Other abnormal glucose: Secondary | ICD-10-CM

## 2022-05-11 DIAGNOSIS — E782 Mixed hyperlipidemia: Secondary | ICD-10-CM

## 2022-05-11 DIAGNOSIS — E063 Autoimmune thyroiditis: Secondary | ICD-10-CM

## 2022-05-11 DIAGNOSIS — I10 Essential (primary) hypertension: Secondary | ICD-10-CM

## 2022-05-11 LAB — BAYER DCA HB A1C WAIVED: HB A1C (BAYER DCA - WAIVED): 5.8 % — ABNORMAL HIGH (ref 4.8–5.6)

## 2022-05-11 LAB — MICROALBUMIN, URINE WAIVED
Creatinine, Urine Waived: 50 mg/dL (ref 10–300)
Microalb, Ur Waived: 30 mg/L — ABNORMAL HIGH (ref 0–19)

## 2022-05-11 MED ORDER — ATORVASTATIN CALCIUM 10 MG PO TABS: ORAL_TABLET | ORAL | 4 refills | Status: DC

## 2022-05-11 NOTE — Assessment & Plan Note (Signed)
Ongoing.  She did not tolerate low dose Levothyroxine in past, but discussed with her may benefit retrial of this in future.  Educated her on Hashimoto's thyroiditis and effect on thyroid over time.  Recheck TSH and Free T4 today, no current symptoms -- recent labs TSH stable.  Normal biopsy in past, but multinodular goiter noted on u/s.

## 2022-05-11 NOTE — Progress Notes (Signed)
BP 122/80   Pulse 76   Temp 97.7 F (36.5 C) (Oral)   Ht 5' 2.01" (1.575 m)   Wt 157 lb 14.4 oz (71.6 kg)   LMP  (LMP Unknown)   SpO2 97%   BMI 28.87 kg/m    Subjective:    Patient ID: Carol [name], female    DOB: Jun 08, 1950, 72 y.o.   MRN: [id number]  HPI: Carol [name] is a 72 y.o. female  Chief Complaint  Patient presents with   Hypertension   Hyperlipidemia   Hypothyroidism   Proteinuria   HYPERTENSION / HYPERLIPIDEMIA Taking Losartan 25 MG daily for HTN and proteinuria and Lipitor every other day.  History of elevation alkaline phosphatase.  A1c elevation past labs, no symptoms and focused on diet -- recent A1c in December 6.3%.   Satisfied with current treatment? yes Duration of hypertension: chronic BP monitoring frequency: not checking BP range: BP medication side effects: no Duration of hyperlipidemia: chronic Cholesterol medication side effects: no Cholesterol supplements: none Medication compliance: good compliance Aspirin: yes Recent stressors: no Recurrent headaches: no Visual changes: no Palpitations: no Dyspnea: no Chest pain: no Lower extremity edema: no Dizzy/lightheaded: no  HASHIMOTO'S THYROID Thyroid ultrasound 10/30/19 -- multinodular goiter noted -- ENT ordered -- thyroid biopsy at time which was normal.  Recent TSH was 3.340.  In past she took a low dose Levothyroxine and reported side effects. Fatigue: no Cold intolerance: no Heat intolerance: no Weight gain: no Weight loss: no Constipation: no Diarrhea/loose stools: no Palpitations: no Lower extremity edema: no Anxiety/depressed mood: no      02/09/2022   12:02 PM 11/09/2021    9:28 AM 10/13/2021    9:30 AM 09/01/2021   11:10 AM 08/07/2021   10:13 AM  Depression screen PHQ 2/9  Decreased Interest 0 0 0 0 0  Down, Depressed, Hopeless 0 0 0 0 0  PHQ - 2 Score 0 0 0 0 0  Altered sleeping 0 0  0 0  Tired, decreased energy 0 0  0 0  Change in appetite 0 0  0 0  Feeling  bad or failure about yourself  0 0  0 0  Trouble concentrating 0 0  0 0  Moving slowly or fidgety/restless 0 0  0 0  Suicidal thoughts 0 0  0 0  PHQ-9 Score 0 0  0 0  Difficult doing work/chores Not difficult at all Not difficult at all  Not difficult at all Not difficult at all       02/09/2022   12:02 PM 11/09/2021    9:28 AM 04/01/2021    9:31 AM 02/03/2021   10:49 AM  GAD 7 : Generalized Anxiety Score  Nervous, Anxious, on Edge 0 0 0 0  Control/stop worrying 0 0 0 0  Worry too much - different things 0 0 0 0  Trouble relaxing 0 0 0 0  Restless 0 0 0 0  Easily annoyed or irritable 0 0 0 0  Afraid - awful might happen 0 0 0 0  Total GAD 7 Score 0 0 0 0  Anxiety Difficulty Not difficult at all Not difficult at all Not difficult at all Not difficult at all    Relevant past medical, surgical, family and social history reviewed and updated as indicated. Interim medical history since our last visit reviewed. Allergies and medications reviewed and updated.  Review of Systems  Constitutional:  Negative for activity change, appetite change, diaphoresis, fatigue and fever.  Respiratory:  Negative for cough, chest tightness and shortness of breath.   Cardiovascular:  Negative for chest pain, palpitations and leg swelling.  Gastrointestinal: Negative.   Neurological: Negative.   Psychiatric/Behavioral: Negative.      Per HPI unless specifically indicated above     Objective:    BP 122/80   Pulse 76   Temp 97.7 F (36.5 C) (Oral)   Ht 5' 2.01" (1.575 m)   Wt 157 lb 14.4 oz (71.6 kg)   LMP  (LMP Unknown)   SpO2 97%   BMI 28.87 kg/m   Wt Readings from Last 3 Encounters:  05/11/22 157 lb 14.4 oz (71.6 kg)  02/09/22 155 lb 9.6 oz (70.6 kg)  01/29/22 156 lb (70.8 kg)    Physical Exam Vitals and nursing note reviewed.  Constitutional:      General: She is awake. She is not in acute distress.    Appearance: She is well-developed and overweight. She is not ill-appearing.   HENT:     Head: Normocephalic.     Right Ear: Hearing, tympanic membrane, ear canal and external ear normal.     Left Ear: Hearing, tympanic membrane, ear canal and external ear normal.  Eyes:     General: Lids are normal.        Right eye: No discharge.        Left eye: No discharge.     Conjunctiva/sclera: Conjunctivae normal.     Pupils: Pupils are equal, round, and reactive to light.  Neck:     Thyroid: Thyromegaly present.     Vascular: No carotid bruit.  Cardiovascular:     Rate and Rhythm: Normal rate and regular rhythm.     Heart sounds: Normal heart sounds. No murmur heard.    No gallop.  Pulmonary:     Effort: Pulmonary effort is normal. No accessory muscle usage or respiratory distress.     Breath sounds: Normal breath sounds.  Abdominal:     General: Bowel sounds are normal.     Palpations: Abdomen is soft.  Musculoskeletal:     Cervical back: Normal range of motion and neck supple.     Right lower leg: No edema.     Left lower leg: No edema.  Lymphadenopathy:     Head:     Right side of head: No submental, submandibular, tonsillar, preauricular or posterior auricular adenopathy.     Left side of head: No submental, submandibular, tonsillar, preauricular or posterior auricular adenopathy.     Cervical: No cervical adenopathy.  Skin:    General: Skin is warm and dry.  Neurological:     Mental Status: She is alert and oriented to person, place, and time.  Psychiatric:        Attention and Perception: Attention normal.        Mood and Affect: Mood normal.        Speech: Speech normal.        Behavior: Behavior normal. Behavior is cooperative.        Thought Content: Thought content normal.    Results for orders placed or performed in visit on [id number]  Basic metabolic panel  Result Value Ref Range   Glucose 89 70 - 99 mg/dL   BUN 11 8 - 27 mg/dL   Creatinine, Ser 0.84 0.57 - 1.00 mg/dL   eGFR 75 >59 mL/min/1.73   BUN/Creatinine Ratio 13 12 - 28   Sodium  139 134 - 144 mmol/L   Potassium 3.8 3.5 -  5.2 mmol/L   Chloride 99 96 - 106 mmol/L   CO2 24 20 - 29 mmol/L   Calcium 9.8 8.7 - 10.3 mg/dL  T4, free  Result Value Ref Range   Free T4 1.07 0.82 - 1.77 ng/dL  TSH  Result Value Ref Range   TSH 3.340 0.450 - 4.500 uIU/mL  HgB A1c  Result Value Ref Range   Hgb A1c MFr Bld 6.3 (H) 4.8 - 5.6 %   Est. average glucose Bld gHb Est-mCnc 134 mg/dL  Lipid Panel w/o Chol/HDL Ratio  Result Value Ref Range   Cholesterol, Total 161 100 - 199 mg/dL   Triglycerides 92 0 - 149 mg/dL   HDL 50 >39 mg/dL   VLDL Cholesterol Cal 17 5 - 40 mg/dL   LDL Chol Calc (NIH) 94 0 - 99 mg/dL      Assessment & Plan:   Problem List Items Addressed This Visit       Cardiovascular and Mediastinum   Hypertension - Primary    Chronic, stable.  BP at goal today for age.  Recommend she monitor at least a few times a week at home.  Continue Losartan 25 MG daily and adjust as needed -- she did not tolerate increase in this dose to 50 MG in past and had dizziness.  Focus on DASH diet at home.  Labs today: CMP. Return to office in 3 months for BP check and reassurance.        Relevant Medications   atorvastatin (LIPITOR) 10 MG tablet   Other Relevant Orders   Microalbumin, Urine Waived   Comprehensive metabolic panel     Endocrine   Hashimoto's thyroiditis    Ongoing.  She did not tolerate low dose Levothyroxine in past, but discussed with her may benefit retrial of this in future.  Educated her on Hashimoto's thyroiditis and effect on thyroid over time.  Recheck TSH and Free T4 today, no current symptoms -- recent labs TSH stable.  Normal biopsy in past, but multinodular goiter noted on u/s.      Relevant Orders   T4, free   TSH     Other   Alkaline phosphatase elevation    Recheck CMP and GGT today -- monitor.      Relevant Orders   Comprehensive metabolic panel   Gamma GT   Elevated hemoglobin A1c    A1c with trend down today to 5.8%, was 6.3%.   Praised her for this.  Continue diet focus at home.      Relevant Orders   Microalbumin, Urine Waived   Bayer DCA Hb A1c Waived   Hyperlipidemia    Chronic, ongoing.  Continues every other day statin which has decreased side effects.  Lipid panel today.      Relevant Medications   atorvastatin (LIPITOR) 10 MG tablet   Other Relevant Orders   Lipid Panel w/o Chol/HDL Ratio   Comprehensive metabolic panel   Proteinuria    Ongoing with stable kidney blood work.  Continue Losartan for kidney protection, urine ALB 15 May 2022, improved from previous.  Check CMP today.  Consider kidney ultrasound in future, she refuses today.      Relevant Orders   Microalbumin, Urine Waived   Comprehensive metabolic panel     Follow up plan: Return in about 3 months (around 08/11/2022) for HTN/HLD, THYROID.

## 2022-05-11 NOTE — Assessment & Plan Note (Signed)
Chronic, ongoing.  Continues every other day statin which has decreased side effects.  Lipid panel today.

## 2022-05-11 NOTE — Assessment & Plan Note (Signed)
A1c with trend down today to 5.8%, was 6.3%.  Praised her for this.  Continue diet focus at home.

## 2022-05-11 NOTE — Assessment & Plan Note (Signed)
Ongoing with stable kidney blood work.  Continue Losartan for kidney protection, urine ALB 15 May 2022, improved from previous.  Check CMP today.  Consider kidney ultrasound in future, she refuses today.

## 2022-05-11 NOTE — Assessment & Plan Note (Signed)
Recheck CMP and GGT today -- monitor.

## 2022-05-11 NOTE — Assessment & Plan Note (Signed)
Chronic, stable.  BP at goal today for age.  Recommend she monitor at least a few times a week at home.  Continue Losartan 25 MG daily and adjust as needed -- she did not tolerate increase in this dose to 50 MG in past and had dizziness.  Focus on DASH diet at home.  Labs today: CMP. Return to office in 3 months for BP check and reassurance.

## 2022-05-12 LAB — COMPREHENSIVE METABOLIC PANEL
ALT: 12 IU/L (ref 0–32)
AST: 18 IU/L (ref 0–40)
Albumin/Globulin Ratio: 1.2 (ref 1.2–2.2)
Albumin: 3.9 g/dL (ref 3.9–4.9)
Alkaline Phosphatase: 130 IU/L — ABNORMAL HIGH (ref 44–121)
BUN/Creatinine Ratio: 18 (ref 12–28)
BUN: 14 mg/dL (ref 8–27)
Bilirubin Total: 0.4 mg/dL (ref 0.0–1.2)
CO2: 24 mmol/L (ref 20–29)
Calcium: 9.7 mg/dL (ref 8.7–10.3)
Chloride: 100 mmol/L (ref 96–106)
Creatinine, Ser: 0.79 mg/dL (ref 0.57–1.00)
Globulin, Total: 3.2 g/dL (ref 1.5–4.5)
Glucose: 90 mg/dL (ref 70–99)
Potassium: 3.9 mmol/L (ref 3.5–5.2)
Sodium: 139 mmol/L (ref 134–144)
Total Protein: 7.1 g/dL (ref 6.0–8.5)
eGFR: 80 mL/min/{1.73_m2} (ref >59)

## 2022-05-12 LAB — LIPID PANEL W/O CHOL/HDL RATIO
Cholesterol, Total: 177 mg/dL (ref 100–199)
HDL: 45 mg/dL (ref >39)
LDL Chol Calc (NIH): 113 mg/dL — ABNORMAL HIGH (ref 0–99)
Triglycerides: 107 mg/dL (ref 0–149)
VLDL Cholesterol Cal: 19 mg/dL (ref 5–40)

## 2022-05-12 LAB — TSH: TSH: 4.83 u[IU]/mL — ABNORMAL HIGH (ref 0.450–4.500)

## 2022-05-12 LAB — GAMMA GT: GGT: 18 IU/L (ref 0–60)

## 2022-05-12 LAB — T4, FREE: Free T4: 0.97 ng/dL (ref 0.82–1.77)

## 2022-05-12 NOTE — Progress Notes (Signed)
Good morning, please call Leida with lab results, she does not check MyChart: - Thyroid labs show elevation in TSH again and Free T4 normal.  We can recheck again next visit and if trends up I would recommend we try thyroid medication again. - Cholesterol labs a little more elevated this check, continue your statin therapy. - Remainder of labs are stable, including kidney function.  Great job!! Keep being amazing!!  Thank you for allowing me to participate in your care.  I appreciate you. Kindest regards, Zethan Alfieri

## 2022-05-13 ENCOUNTER — Ambulatory Visit: Payer: Self-pay | Admitting: *Deleted

## 2022-05-13 NOTE — Patient Outreach (Signed)
  Care Coordination   Follow Up Visit Note   05/13/2022 Name: AALEAHYA KOLER MRN: [id number] DOB: [birth year]  [name] is a 72 y.o. year old female who sees [place], Henrine Screws T, NP for primary care. I spoke with  [name] by phone today.  What matters to the patients health and wellness today?  Patient report she is doing well, blood pressure has remained controlled.  Will continue to work to keep this managed.  Denies any urgent concerns, encouraged to contact this care manager with questions.     Goals Addressed             This Visit's Progress    COMPLETED: Effective management of BP   On track    Care Coordination Interventions: Evaluation of current treatment plan related to hypertension self management and patient's adherence to plan as established by provider Reviewed medications with patient and discussed importance of compliance Discussed plans with patient for ongoing care management follow up and provided patient with direct contact information for care management team Advised patient, providing education and rationale, to monitor blood pressure daily and record, calling PCP for findings outside established parameters Reviewed scheduled/upcoming provider appointments including:  PCP 6/26 Discussed importance of monitoring BP at home several times a week. Re-educated on proper DASH and diabetic diets in effort to keep blood pressure and A1C controlled      COMPLETED: RNCM: Effective Management of Cough   On track    Care Coordination Interventions: Evaluation of current treatment plan related to cough and sinus drainage that has returned and patient's adherence to plan as established by provider Advised patient to call the office for changes in cough, new onset of sx and sx of infection, questions or concerns Provided education to patient re: monitoring for changes in the color of sputum and to call the office for green or brown color sputum, fever, chills or  other questions or concerns related to chronic conditions Reviewed medications with patient and discussed compliance. Z-pack completed, also has Tussionex and Singulair Reviewed scheduled/upcoming provider appointments including 05/11/2022, reminder given today Discussed plans with patient for ongoing care management follow up and provided patient with direct contact information for care management team  Update 3/28 - No reported current issues with cough           SDOH assessments and interventions completed:  No     Care Coordination Interventions:  Yes, provided   Interventions Today    Flowsheet Row Most Recent Value  Chronic Disease   Chronic disease during today's visit Hypertension (HTN)  General Interventions   General Interventions Discussed/Reviewed General Interventions Reviewed, Doctor Visits  Doctor Visits Discussed/Reviewed Doctor Visits Reviewed, PCP  PCP/Specialist Visits Compliance with follow-up visit  Education Interventions   Education Provided Provided Education  Provided Verbal Education On Nutrition, When to see the doctor, Medication        Follow up plan: No further intervention required.   Encounter Outcome:  Pt. Visit Completed   Valente David, RN, MSN, [place] Care Management Care Management Coordinator [phone]

## 2022-05-13 NOTE — Patient Instructions (Signed)
Visit Information  Thank you for taking time to visit with me today. Please don't hesitate to contact me if I can be of assistance to you.  Following are the goals we discussed today:  Continue monitoring blood pressure on a regular basis.   Eat a low salt diet.   Please call the Suicide and Crisis Lifeline: 988 call the [place] National Suicide Prevention Lifeline: 352-722-1894 or TTY: 209 456 4815 TTY 863-759-6112) to talk to a trained counselor call 1-800-273-TALK (toll free, 24 hour hotline) call 911 if you are experiencing a Mental Health or [place] or need someone to talk to.  Patient verbalizes understanding of instructions and care plan provided today and agrees to view in [place]. Active MyChart status and patient understanding of how to access instructions and care plan via MyChart confirmed with patient.     The patient has been provided with contact information for the care management team and has been advised to call with any health related questions or concerns.   Valente David, RN, MSN, [place] Care Management Care Management Coordinator [phone]

## 2022-07-11 IMAGING — MG MM DIGITAL SCREENING BILAT W/ TOMO AND CAD
6 of 10 series · 6 of 30 positions shown · non-contrast
Comparison: Previous exam(s).

CLINICAL DATA: Screening.

EXAM:
DIGITAL SCREENING BILATERAL MAMMOGRAM WITH TOMOSYNTHESIS AND CAD
TECHNIQUE: Bilateral screening digital craniocaudal and mediolateral oblique
mammograms were obtained. Bilateral screening digital breast
tomosynthesis was performed. The images were evaluated with
computer-aided detection.

[L MLO synth-2D]
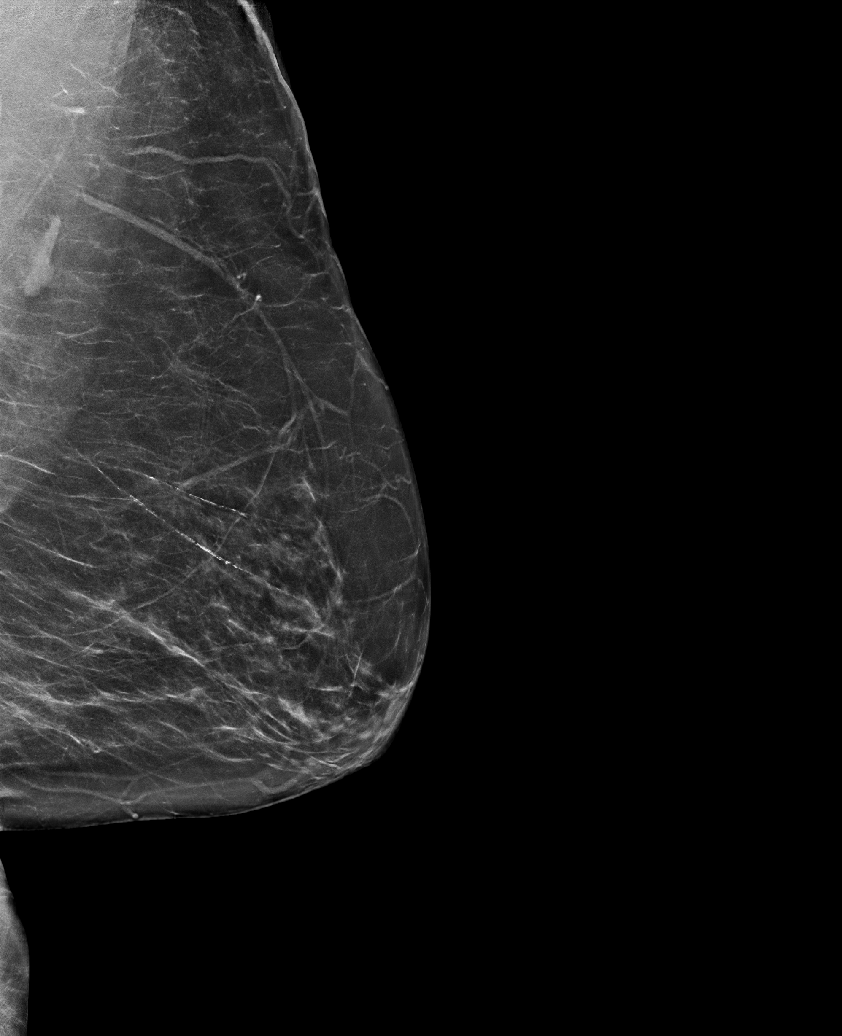

[R CC synth-2D (1 of 2)]
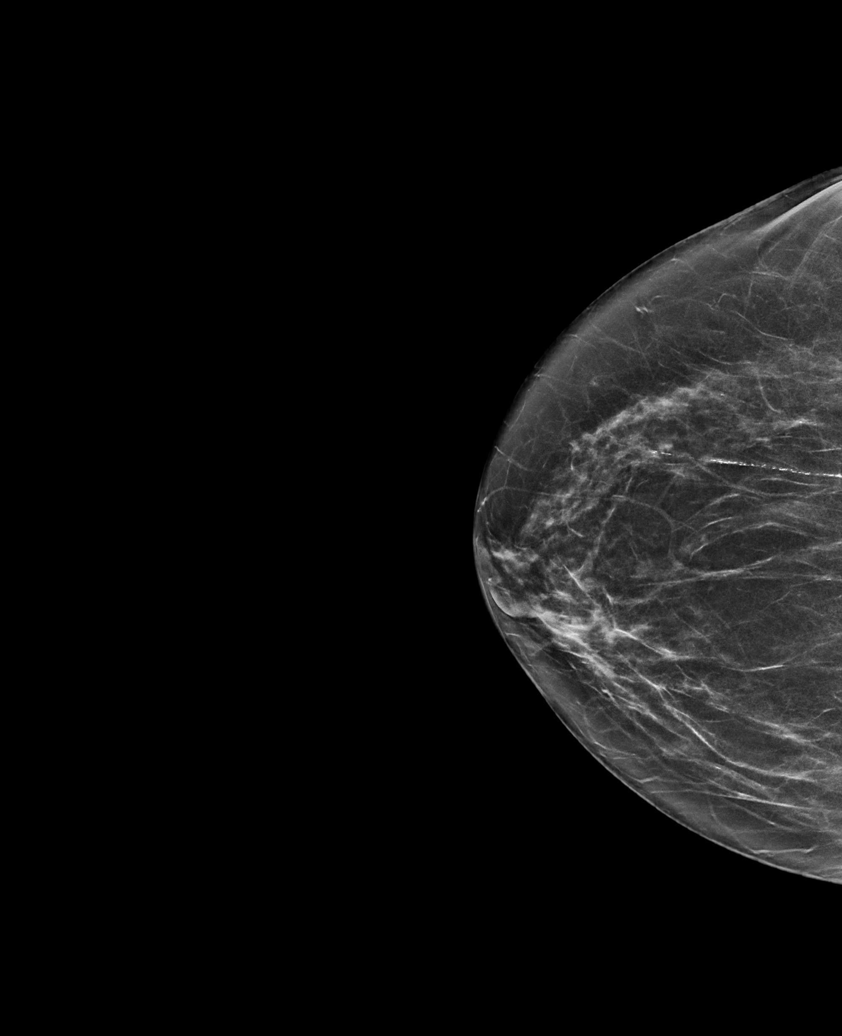

[R MLO synth-2D]
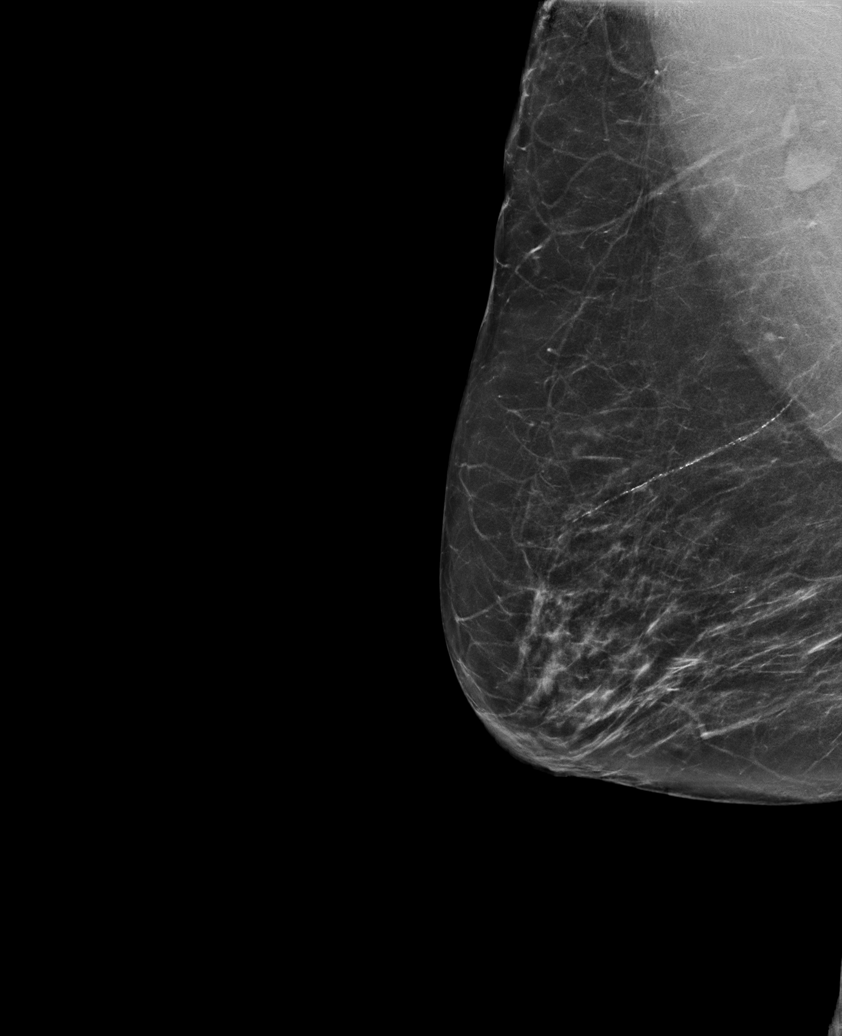

[R CC synth-2D (2 of 2)]
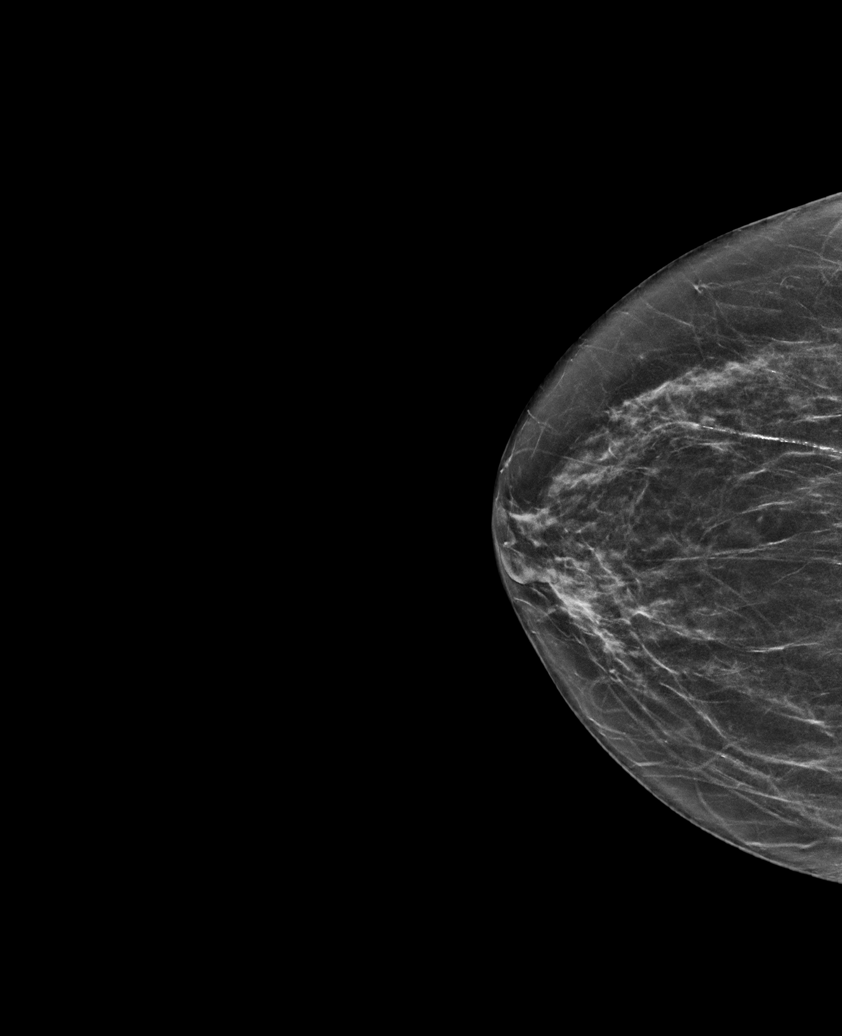

[L CC synth-2D]
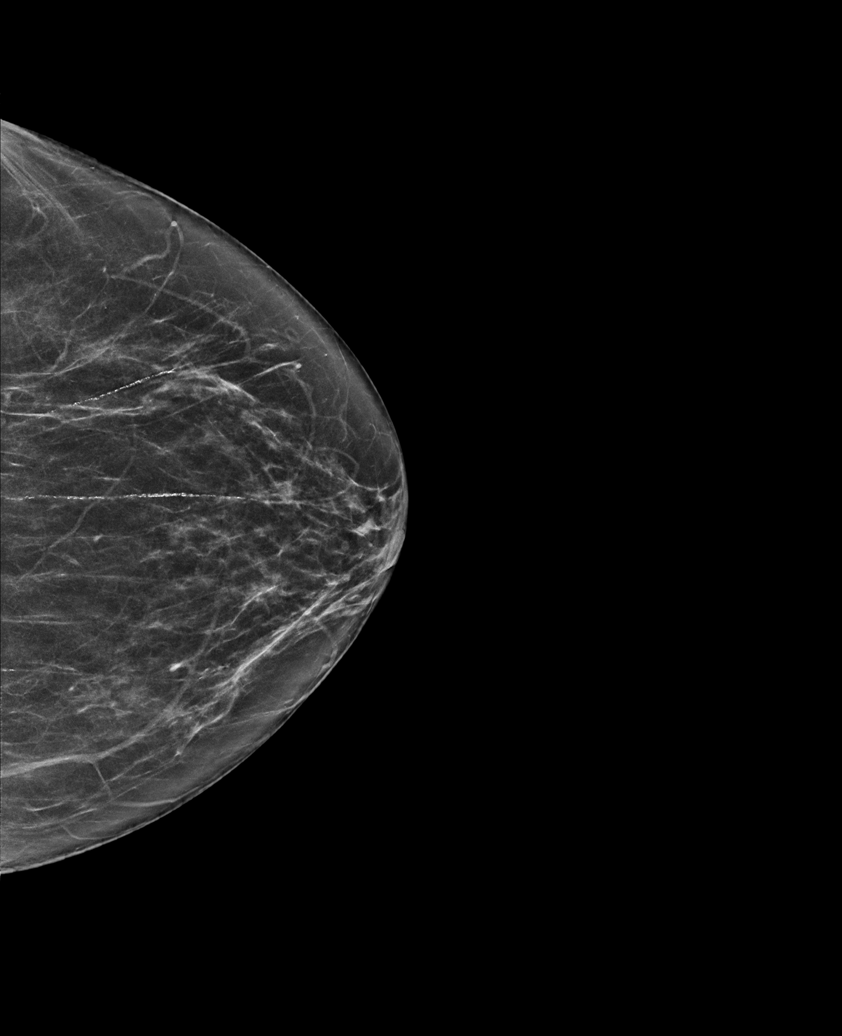

[L CC tomo · tomo slice 32/63.0]
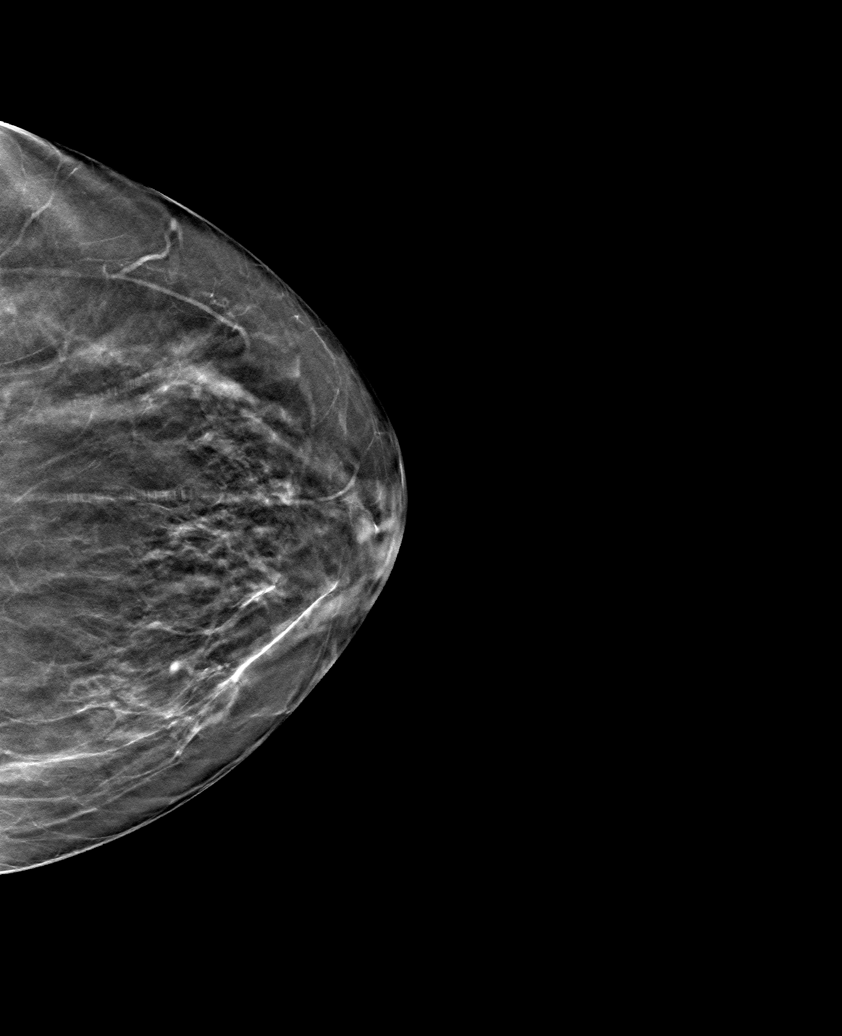

[6 of 30 positions shown; findings below may reference images not displayed]

ACR Breast Density Category b: There are scattered areas of
fibroglandular density.
FINDINGS: There are no findings suspicious for malignancy.
IMPRESSION: No mammographic evidence of malignancy. A result letter of this
screening mammogram will be mailed directly to the patient.

RECOMMENDATION:
Screening mammogram in one year. (Code:51-O-LD2)

BI-RADS CATEGORY  1: Negative.

## 2022-07-20 DIAGNOSIS — M1711 Unilateral primary osteoarthritis, right knee: Secondary | ICD-10-CM | POA: Diagnosis not present

## 2022-08-04 ENCOUNTER — Other Ambulatory Visit: Payer: Self-pay | Admitting: Nurse Practitioner

## 2022-08-04 MED ORDER — FLUTICASONE PROPIONATE 50 MCG/ACT NA SUSP: NASAL | 0 refills | Status: DC

## 2022-08-04 NOTE — Telephone Encounter (Signed)
Duplicate request- filled today by office Requested Prescriptions  Pending Prescriptions Disp Refills   fluticasone (FLONASE) 50 MCG/ACT nasal spray [Pharmacy Med Name: FLUTICASONE PROPIONATE 50 MCG/ACT N] 16 g 0    Sig: SPRAY 2 SPRAYS INTO EACH NOSTRIL ONCE A DAY     Ear, Nose, and Throat: Nasal Preparations - Corticosteroids Passed - 08/04/2022 12:30 PM      Passed - Valid encounter within last 12 months    Recent Outpatient Visits           2 months ago Primary hypertension   [place] [facility] [place], [place] T, NP   5 months ago Primary hypertension   [place] [facility] [place], [place] T, NP   6 months ago Non-seasonal allergic rhinitis due to pollen   [facility] [place], Corrie Dandy T, NP   8 months ago Primary hypertension   [place] [facility] [place], [place] T, NP   10 months ago Sinus drainage   [place] [facility] [place], Dorie Rank, NP       Future Appointments             In 1 week [place], Dorie Rank, NP [place] [facility]

## 2022-08-04 NOTE — Telephone Encounter (Signed)
Requested Prescriptions  Pending Prescriptions Disp Refills   fluticasone (FLONASE) 50 MCG/ACT nasal spray 16 g 0    Sig: SPRAY 2 SPRAYS INTO EACH NOSTRIL ONCE A DAY     Ear, Nose, and Throat: Nasal Preparations - Corticosteroids Passed - 08/04/2022 11:32 AM      Passed - Valid encounter within last 12 months    Recent Outpatient Visits           2 months ago Primary hypertension   [place] [facility] [place], [place] T, NP   5 months ago Primary hypertension   [place] [facility] [place], [place] T, NP   6 months ago Non-seasonal allergic rhinitis due to pollen   [facility] [place], Corrie Dandy T, NP   8 months ago Primary hypertension   [place] [facility] [place], [place] T, NP   10 months ago Sinus drainage   [place] [facility] [place], Dorie Rank, NP       Future Appointments             In 1 week Cannady, Dorie Rank, NP [place] [facility]

## 2022-08-04 NOTE — Telephone Encounter (Signed)
Medication Refill - Medication:   Has the patient contacted their pharmacy? Yes.   (fluticasone [facility]) 50 MCG/ACT nasal spray    [facility] - [place], [place] - [address] [place], [place] [address] Phone: [phone]  Fax: [phone]      Has the patient been seen for an appointment in the last year OR does the patient have an upcoming appointment? Yes.    Agent: Please be advised that RX refills may take up to 3 business days. We ask that you follow-up with your pharmacy.

## 2022-08-11 ENCOUNTER — Ambulatory Visit: Payer: Medicare Other | Admitting: Nurse Practitioner

## 2022-08-28 NOTE — Patient Instructions (Signed)
Be Involved in Caring For Your Health:  Taking Medications When medications are taken as directed, they can greatly improve your health. But if they are not taken as prescribed, they may not work. In some cases, not taking them correctly can be harmful. To help ensure your treatment remains effective and safe, understand your medications and how to take them. Bring your medications to each visit for review by your provider.  Your lab results, notes, and after visit summary will be available on My Chart. We strongly encourage you to use this feature. If lab results are abnormal the clinic will contact you with the appropriate steps. If the clinic does not contact you assume the results are satisfactory. You can always view your results on My Chart. If you have questions regarding your health or results, please contact the clinic during office hours. You can also ask questions on My Chart.  We at [facility] are grateful that you chose us to provide your care. We strive to provide evidence-based and compassionate care and are always looking for feedback. If you get a survey from the clinic please complete this so we can hear your opinions.  DASH Eating Plan DASH stands for Dietary Approaches to Stop Hypertension. The DASH eating plan is a healthy eating plan that has been shown to: Lower high blood pressure (hypertension). Reduce your risk for type 2 diabetes, heart disease, and stroke. Help with weight loss. What are tips for following this plan? Reading food labels Check food labels for the amount of salt (sodium) per serving. Choose foods with less than 5 percent of the Daily Value (DV) of sodium. In general, foods with less than 300 milligrams (mg) of sodium per serving fit into this eating plan. To find whole grains, look for the word "whole" as the first word in the ingredient list. Shopping Buy products labeled as "low-sodium" or "no salt added." Buy fresh foods. Avoid canned  foods and pre-made or frozen meals. Cooking Try not to add salt when you cook. Use salt-free seasonings or herbs instead of table salt or sea salt. Check with your health care provider or pharmacist before using salt substitutes. Do not fry foods. Cook foods in healthy ways, such as baking, boiling, grilling, roasting, or broiling. Cook using oils that are good for your heart. These include olive, canola, avocado, soybean, and sunflower oil. Meal planning  Eat a balanced diet. This should include: 4 or more servings of fruits and 4 or more servings of vegetables each day. Try to fill half of your plate with fruits and vegetables. 6-8 servings of whole grains each day. 6 or less servings of lean meat, poultry, or fish each day. 1 oz is 1 serving. A 3 oz (85 g) serving of meat is about the same size as the palm of your hand. One egg is 1 oz (28 g). 2-3 servings of low-fat dairy each day. One serving is 1 cup (237 mL). 1 serving of nuts, seeds, or beans 5 times each week. 2-3 servings of heart-healthy fats. Healthy fats called omega-3 fatty acids are found in foods such as walnuts, flaxseeds, fortified milks, and eggs. These fats are also found in cold-water fish, such as sardines, salmon, and mackerel. Limit how much you eat of: Canned or prepackaged foods. Food that is high in trans fat, such as fried foods. Food that is high in saturated fat, such as fatty meat. Desserts and other sweets, sugary drinks, and other foods with added sugar. Full-fat   dairy products. Do not salt foods before eating. Do not eat more than 4 egg yolks a week. Try to eat at least 2 vegetarian meals a week. Eat more home-cooked food and less restaurant, buffet, and fast food. Lifestyle When eating at a restaurant, ask if your food can be made with less salt or no salt. If you drink alcohol: Limit how much you have to: 0-1 drink a day if you are female. 0-2 drinks a day if you are female. Know how much alcohol is in  your drink. In the [place] one drink is one 12 oz bottle of beer (355 mL), one 5 oz glass of wine (148 mL), or one 1 oz glass of hard liquor (44 mL). General information Avoid eating more than 2,300 mg of salt a day. If you have hypertension, you may need to reduce your sodium intake to 1,500 mg a day. Work with your provider to stay at a healthy body weight or lose weight. Ask what the best weight range is for you. On most days of the week, get at least 30 minutes of exercise that causes your heart to beat faster. This may include walking, swimming, or biking. Work with your provider or dietitian to adjust your eating plan to meet your specific calorie needs. What foods should I eat? Fruits All fresh, dried, or frozen fruit. Canned fruits that are in their natural juice and do not have sugar added to them. Vegetables Fresh or frozen vegetables that are raw, steamed, roasted, or grilled. Low-sodium or reduced-sodium tomato and vegetable juice. Low-sodium or reduced-sodium tomato sauce and tomato paste. Low-sodium or reduced-sodium canned vegetables. Grains Whole-grain or whole-wheat bread. Whole-grain or whole-wheat pasta. Brown rice. Oatmeal. Quinoa. Bulgur. Whole-grain and low-sodium cereals. Pita bread. Low-fat, low-sodium crackers. Whole-wheat flour tortillas. Meats and other proteins Skinless chicken or turkey. Ground chicken or turkey. Pork with fat trimmed off. Fish and seafood. Egg whites. Dried beans, peas, or lentils. Unsalted nuts, nut butters, and seeds. Unsalted canned beans. Lean cuts of beef with fat trimmed off. Low-sodium, lean precooked or cured meat, such as sausages or meat loaves. Dairy Low-fat (1%) or fat-free (skim) milk. Reduced-fat, low-fat, or fat-free cheeses. Nonfat, low-sodium ricotta or cottage cheese. Low-fat or nonfat yogurt. Low-fat, low-sodium cheese. Fats and oils Soft margarine without trans fats. Vegetable oil. Reduced-fat, low-fat, or light mayonnaise and salad  dressings (reduced-sodium). Canola, safflower, olive, avocado, soybean, and sunflower oils. Avocado. Seasonings and condiments Herbs. Spices. Seasoning mixes without salt. Other foods Unsalted popcorn and pretzels. Fat-free sweets. The items listed above may not be all the foods and drinks you can have. Talk to a dietitian to learn more. What foods should I avoid? Fruits Canned fruit in a light or heavy syrup. Fried fruit. Fruit in cream or butter sauce. Vegetables Creamed or fried vegetables. Vegetables in a cheese sauce. Regular canned vegetables that are not marked as low-sodium or reduced-sodium. Regular canned tomato sauce and paste that are not marked as low-sodium or reduced-sodium. Regular tomato and vegetable juices that are not marked as low-sodium or reduced-sodium. Pickles. Olives. Grains Baked goods made with fat, such as croissants, muffins, or some breads. Dry pasta or rice meal packs. Meats and other proteins Fatty cuts of meat. Ribs. Fried meat. Bacon. Bologna, salami, and other precooked or cured meats, such as sausages or meat loaves, that are not lean and low in sodium. Fat from the back of a pig (fatback). Bratwurst. Salted nuts and seeds. Canned beans with added salt. Canned   or smoked fish. Whole eggs or egg yolks. Chicken or turkey with skin. Dairy Whole or 2% milk, cream, and half-and-half. Whole or full-fat cream cheese. Whole-fat or sweetened yogurt. Full-fat cheese. Nondairy creamers. Whipped toppings. Processed cheese and cheese spreads. Fats and oils Butter. Stick margarine. Lard. Shortening. Ghee. Bacon fat. Tropical oils, such as coconut, palm kernel, or palm oil. Seasonings and condiments Onion salt, garlic salt, seasoned salt, table salt, and sea salt. Worcestershire sauce. Tartar sauce. Barbecue sauce. Teriyaki sauce. Soy sauce, including reduced-sodium soy sauce. Steak sauce. Canned and packaged gravies. Fish sauce. Oyster sauce. Cocktail sauce. Store-bought  horseradish. Ketchup. Mustard. Meat flavorings and tenderizers. Bouillon cubes. Hot sauces. Pre-made or packaged marinades. Pre-made or packaged taco seasonings. Relishes. Regular salad dressings. Other foods Salted popcorn and pretzels. The items listed above may not be all the foods and drinks you should avoid. Talk to a dietitian to learn more. Where to find more information National Heart, Lung, and Blood Institute (NHLBI): nhlbi.nih.gov American Heart Association (AHA): heart.org Academy of Nutrition and Dietetics: eatright.org National Kidney Foundation (NKF): kidney.org This information is not intended to replace advice given to you by your health care provider. Make sure you discuss any questions you have with your health care provider. Document Revised: 02/18/2022 Document Reviewed: 02/18/2022 Elsevier Patient Education  2024 Elsevier Inc.  

## 2022-08-30 ENCOUNTER — Encounter: Payer: Self-pay | Admitting: Nurse Practitioner

## 2022-08-30 ENCOUNTER — Ambulatory Visit (INDEPENDENT_AMBULATORY_CARE_PROVIDER_SITE_OTHER): Payer: Medicare Other | Admitting: Nurse Practitioner

## 2022-08-30 VITALS — BP 138/60 | HR 86 | Temp 97.7°F | Ht 62.01 in | Wt 154.4 lb

## 2022-08-30 DIAGNOSIS — E782 Mixed hyperlipidemia: Secondary | ICD-10-CM | POA: Diagnosis not present

## 2022-08-30 DIAGNOSIS — E063 Autoimmune thyroiditis: Secondary | ICD-10-CM

## 2022-08-30 DIAGNOSIS — I10 Essential (primary) hypertension: Secondary | ICD-10-CM

## 2022-08-30 DIAGNOSIS — R7309 Other abnormal glucose: Secondary | ICD-10-CM | POA: Diagnosis not present

## 2022-08-30 NOTE — Assessment & Plan Note (Signed)
Chronic, stable.  BP at goal today for age.  Recommend she monitor at least a few times a week at home.  Continue Losartan 25 MG daily and adjust as needed -- she did not tolerate increase in this dose to 50 MG in past and had dizziness.  Focus on DASH diet at home.  Labs today: up to date. Return to office in 3 months for BP check and reassurance.

## 2022-08-30 NOTE — Assessment & Plan Note (Signed)
Ongoing.  She did not tolerate low dose Levothyroxine in past, but discussed with her may benefit retrial of this in future due to levels on labs.  Educated her on Hashimoto's thyroiditis and effect on thyroid over time.  Recheck TSH and Free T4 next visit (she refuses today, although recommended), no current symptoms -- recent labs TSH slightly elevated.  Normal biopsy in past, but multinodular goiter noted on u/s.

## 2022-08-30 NOTE — Progress Notes (Signed)
BP 138/60   Pulse 86   Temp 97.7 F (36.5 C) (Oral)   Ht 5' 2.01" (1.575 m)   Wt 154 lb 6.4 oz (70 kg)   LMP  (LMP Unknown)   SpO2 97%   BMI 28.23 kg/m    Subjective:    Patient ID: Carol [name], female    DOB: 06/11/1950, 72 y.o.   MRN: [id number]  HPI: Carol [name] is a 72 y.o. female  Chief Complaint  Patient presents with   Hypertension   Hyperlipidemia   Thyroid   HYPERTENSION / HYPERLIPIDEMIA Taking Losartan 25 MG daily for HTN and proteinuria and Lipitor every other day.  A1c elevation past labs, no symptoms and focused on diet -- recent A1c in March 5.8%.   Satisfied with current treatment? yes Duration of hypertension: chronic BP monitoring frequency: not checking BP range: BP medication side effects: no Duration of hyperlipidemia: chronic Cholesterol medication side effects: no Cholesterol supplements: none Medication compliance: good compliance Aspirin: yes Recent stressors: no Recurrent headaches: no Visual changes: no Palpitations: no Dyspnea: no Chest pain: no Lower extremity edema: no Dizzy/lightheaded: no  HASHIMOTO'S THYROID Thyroid ultrasound 10/30/19 -- multinodular goiter noted -- ENT ordered -- thyroid biopsy at time which was normal.  Recent TSH was slightly elevated.  In past she took a low dose Levothyroxine and reported side effects. Fatigue: no Cold intolerance: no Heat intolerance: no Weight gain: no Weight loss: no Constipation: no Diarrhea/loose stools: no Palpitations: no Lower extremity edema: no Anxiety/depressed mood: no      02/09/2022   12:02 PM 11/09/2021    9:28 AM 10/13/2021    9:30 AM 09/01/2021   11:10 AM 08/07/2021   10:13 AM  Depression screen PHQ 2/9  Decreased Interest 0 0 0 0 0  Down, Depressed, Hopeless 0 0 0 0 0  PHQ - 2 Score 0 0 0 0 0  Altered sleeping 0 0  0 0  Tired, decreased energy 0 0  0 0  Change in appetite 0 0  0 0  Feeling bad or failure about yourself  0 0  0 0  Trouble concentrating  0 0  0 0  Moving slowly or fidgety/restless 0 0  0 0  Suicidal thoughts 0 0  0 0  PHQ-9 Score 0 0  0 0  Difficult doing work/chores Not difficult at all Not difficult at all  Not difficult at all Not difficult at all       02/09/2022   12:02 PM 01/29/2022    4:23 PM 11/09/2021    9:28 AM 04/01/2021    9:31 AM  GAD 7 : Generalized Anxiety Score  Nervous, Anxious, on Edge 0 -- 0 0  Control/stop worrying 0  0 0  Worry too much - different things 0  0 0  Trouble relaxing 0  0 0  Restless 0  0 0  Easily annoyed or irritable 0  0 0  Afraid - awful might happen 0  0 0  Total GAD 7 Score 0  0 0  Anxiety Difficulty Not difficult at all  Not difficult at all Not difficult at all    Relevant past medical, surgical, family and social history reviewed and updated as indicated. Interim medical history since our last visit reviewed. Allergies and medications reviewed and updated.  Review of Systems  Constitutional:  Negative for activity change, appetite change, diaphoresis, fatigue and fever.  Respiratory:  Negative for cough, chest tightness and shortness of  breath.   Cardiovascular:  Negative for chest pain, palpitations and leg swelling.  Gastrointestinal: Negative.   Neurological: Negative.   Psychiatric/Behavioral: Negative.     Per HPI unless specifically indicated above     Objective:    BP 138/60   Pulse 86   Temp 97.7 F (36.5 C) (Oral)   Ht 5' 2.01" (1.575 m)   Wt 154 lb 6.4 oz (70 kg)   LMP  (LMP Unknown)   SpO2 97%   BMI 28.23 kg/m   Wt Readings from Last 3 Encounters:  08/30/22 154 lb 6.4 oz (70 kg)  05/11/22 157 lb 14.4 oz (71.6 kg)  02/09/22 155 lb 9.6 oz (70.6 kg)    Physical Exam Vitals and nursing note reviewed.  Constitutional:      General: She is awake. She is not in acute distress.    Appearance: She is well-developed and overweight. She is not ill-appearing.  HENT:     Head: Normocephalic.     Right Ear: Hearing, tympanic membrane, ear canal and  external ear normal.     Left Ear: Hearing, tympanic membrane, ear canal and external ear normal.  Eyes:     General: Lids are normal.        Right eye: No discharge.        Left eye: No discharge.     Conjunctiva/sclera: Conjunctivae normal.     Pupils: Pupils are equal, round, and reactive to light.  Neck:     Thyroid: Thyromegaly present.     Vascular: No carotid bruit.  Cardiovascular:     Rate and Rhythm: Normal rate and regular rhythm.     Heart sounds: Normal heart sounds. No murmur heard.    No gallop.  Pulmonary:     Effort: Pulmonary effort is normal. No accessory muscle usage or respiratory distress.     Breath sounds: Normal breath sounds.  Abdominal:     General: Bowel sounds are normal.     Palpations: Abdomen is soft.  Musculoskeletal:     Cervical back: Normal range of motion and neck supple.     Right lower leg: No edema.     Left lower leg: No edema.  Lymphadenopathy:     Head:     Right side of head: No submental, submandibular, tonsillar, preauricular or posterior auricular adenopathy.     Left side of head: No submental, submandibular, tonsillar, preauricular or posterior auricular adenopathy.     Cervical: No cervical adenopathy.  Skin:    General: Skin is warm and dry.  Neurological:     Mental Status: She is alert and oriented to person, place, and time.  Psychiatric:        Attention and Perception: Attention normal.        Mood and Affect: Mood normal.        Speech: Speech normal.        Behavior: Behavior normal. Behavior is cooperative.        Thought Content: Thought content normal.    Results for orders placed or performed in visit on 05/11/22  Microalbumin, Urine Waived  Result Value Ref Range   Microalb, Ur Waived 30 (H) 0 - 19 mg/L   Creatinine, Urine Waived 50 10 - 300 mg/dL   Microalb/Creat Ratio 30-300 (H) <30 mg/g  Bayer DCA Hb A1c Waived  Result Value Ref Range   HB A1C (BAYER DCA - WAIVED) 5.8 (H) 4.8 - 5.6 %  T4, free   Result Value Ref  Range   Free T4 0.97 0.82 - 1.77 ng/dL  TSH  Result Value Ref Range   TSH 4.830 (H) 0.450 - 4.500 uIU/mL  Lipid Panel w/o Chol/HDL Ratio  Result Value Ref Range   Cholesterol, Total 177 100 - 199 mg/dL   Triglycerides 161 0 - 149 mg/dL   HDL 45 >09 mg/dL   VLDL Cholesterol Cal 19 5 - 40 mg/dL   LDL Chol Calc (NIH) 604 (H) 0 - 99 mg/dL  Comprehensive metabolic panel  Result Value Ref Range   Glucose 90 70 - 99 mg/dL   BUN 14 8 - 27 mg/dL   Creatinine, Ser 5.40 0.57 - 1.00 mg/dL   eGFR 80 >98 JX/BJY/7.82   BUN/Creatinine Ratio 18 12 - 28   Sodium 139 134 - 144 mmol/L   Potassium 3.9 3.5 - 5.2 mmol/L   Chloride 100 96 - 106 mmol/L   CO2 24 20 - 29 mmol/L   Calcium 9.7 8.7 - 10.3 mg/dL   Total Protein 7.1 6.0 - 8.5 g/dL   Albumin 3.9 3.9 - 4.9 g/dL   Globulin, Total 3.2 1.5 - 4.5 g/dL   Albumin/Globulin Ratio 1.2 1.2 - 2.2   Bilirubin Total 0.4 0.0 - 1.2 mg/dL   Alkaline Phosphatase 130 (H) 44 - 121 IU/L   AST 18 0 - 40 IU/L   ALT 12 0 - 32 IU/L  Gamma GT  Result Value Ref Range   GGT 18 0 - 60 IU/L      Assessment & Plan:   Problem List Items Addressed This Visit       Cardiovascular and Mediastinum   Hypertension    Chronic, stable.  BP at goal today for age.  Recommend she monitor at least a few times a week at home.  Continue Losartan 25 MG daily and adjust as needed -- she did not tolerate increase in this dose to 50 MG in past and had dizziness.  Focus on DASH diet at home.  Labs today: up to date. Return to office in 3 months for BP check and reassurance.          Endocrine   Hashimoto's thyroiditis - Primary    Ongoing.  She did not tolerate low dose Levothyroxine in past, but discussed with her may benefit retrial of this in future due to levels on labs.  Educated her on Hashimoto's thyroiditis and effect on thyroid over time.  Recheck TSH and Free T4 next visit (she refuses today, although recommended), no current symptoms -- recent labs TSH  slightly elevated.  Normal biopsy in past, but multinodular goiter noted on u/s.        Other   Elevated hemoglobin A1c    A1c with trend down recent visit to 5.8%, was 6.3%.  Praised her for this.  Continue diet focus at home.  Recheck next visit.      Hyperlipidemia    Chronic, ongoing.  Continues every other day statin which has decreased side effects.  Lipid panel next visit.        Follow up plan: Return in about 3 months (around 11/30/2022) for HTN/HLD, THYROID DISEASE, A1c check.

## 2022-08-30 NOTE — Assessment & Plan Note (Signed)
Chronic, ongoing.  Continues every other day statin which has decreased side effects.  Lipid panel next visit.

## 2022-08-30 NOTE — Assessment & Plan Note (Signed)
A1c with trend down recent visit to 5.8%, was 6.3%.  Praised her for this.  Continue diet focus at home.  Recheck next visit.

## 2022-09-07 ENCOUNTER — Ambulatory Visit (INDEPENDENT_AMBULATORY_CARE_PROVIDER_SITE_OTHER): Payer: Medicare Other

## 2022-09-07 VITALS — Ht 62.0 in | Wt 154.0 lb

## 2022-09-07 DIAGNOSIS — Z0000 Encounter for general adult medical examination without abnormal findings: Secondary | ICD-10-CM | POA: Diagnosis not present

## 2022-09-07 NOTE — Progress Notes (Signed)
Subjective:   Carol [name] is a 72 y.o. female who presents for Medicare Annual (Subsequent) preventive examination.  Per patient no change in vitals since last visit; unable to obtain new vitals due to this being a telehealth visit. 09/07/22 Patient was unable to self-report vital signs via telehealth due to a lack of equipment at home.   Visit Complete: Virtual  I connected with  Carol [name] on 09/07/22 by a audio enabled telemedicine application and verified that I am speaking with the correct person using two identifiers.  Patient Location: Home  Provider Location: Office/Clinic  I discussed the limitations of evaluation and management by telemedicine. The patient expressed understanding and agreed to proceed.  Review of Systems     Cardiac Risk Factors include: advanced age (>27men, >69 women);dyslipidemia;hypertension;sedentary lifestyle     Objective:    Today's Vitals   09/07/22 1114  Weight: 154 lb (69.9 kg)  Height: 5\' 2"  (1.575 m)   Body mass index is 28.17 kg/m.     09/07/2022   11:05 AM 09/01/2021   11:05 AM 08/25/2020   11:16 AM 08/16/2019    9:51 AM 08/13/2019   11:18 AM 01/31/2017    9:01 AM 01/30/2016    9:53 AM  Advanced Directives  Does Patient Have a Medical Advance Directive? No No No No No Yes No  Does patient want to make changes to medical advance directive?      Yes (MAU/Ambulatory/Procedural Areas - Information given)   Would patient like information on creating a medical advance directive? No - Patient declined No - Patient declined  Yes (MAU/Ambulatory/Procedural Areas - Information given)       Current Medications (verified) Outpatient Encounter Medications as of 09/07/2022  Medication Sig   aspirin 81 MG tablet Take 81 mg by mouth daily.   atorvastatin (LIPITOR) 10 MG tablet TAKE 1 TABLET BY MOUTH EVERY OTHER DAY   diclofenac Sodium (VOLTAREN) 1 % GEL Apply 2 g topically 4 (four) times daily.   fluticasone (FLONASE) 50 MCG/ACT nasal  spray SPRAY 2 SPRAYS INTO EACH NOSTRIL ONCE A DAY   lidocaine (LIDODERM) 5 % Place 1 patch onto the skin daily. Remove & Discard patch within 12 hours or as directed by MD   loratadine (CLARITIN) 10 MG tablet Take 10 mg by mouth daily.   losartan (COZAAR) 50 MG tablet TAKE 1/2 TABLET (25 MG TOTAL) BY MOUTH DAILY   meclizine (ANTIVERT) 25 MG tablet Take 1 tablet (25 mg total) by mouth 3 (three) times daily as needed for dizziness.   meloxicam (MOBIC) 15 MG tablet Take 1 tablet (15 mg total) by mouth daily.   triamcinolone (KENALOG) 0.1 % Apply 1 application topically 2 (two) times daily.   No facility-administered encounter medications on file as of 09/07/2022.    Allergies (verified) Patient has no known allergies.   History: Past Medical History:  Diagnosis Date   Alkaline phosphatase elevation    Allergy    Eczema    GERD (gastroesophageal reflux disease)    Heart murmur    Hypertension    Muscle cramps    Osteoarthritis    Osteoporosis    Past Surgical History:  Procedure Laterality Date   CATARACT EXTRACTION W/PHACO Right 08/16/2019   Procedure: CATARACT EXTRACTION PHACO AND INTRAOCULAR LENS PLACEMENT (IOC) RIGHT VISION BLUE 5.18  00:36.0;  Surgeon: Elliot Cousin, MD;  Location: [facility];  Service: Ophthalmology;  Laterality: Right;   CATARACT EXTRACTION W/PHACO Left 10/04/2019   Procedure:  CATARACT EXTRACTION PHACO AND INTRAOCULAR LENS PLACEMENT (IOC) LEFT VISION BLUE;  Surgeon: Elliot Cousin, MD;  Location: [facility];  Service: Ophthalmology;  Laterality: Left;  6.47 0:39.8   JOINT REPLACEMENT Right 2013   hip   Family History  Problem Relation Age of Onset   Cancer Mother    CAD Mother    Hypertension Mother    Cancer Father        throat and lung   Hypertension Brother    Hypertension Daughter    Hypertension Son    Hypertension Brother    Hypertension Brother    Breast cancer Neg Hx    Social History   Socioeconomic History   Marital  status: Widowed    Spouse name: Not on file   Number of children: Not on file   Years of education: 12   Highest education level: 12th grade  Occupational History   Occupation: retired  Tobacco Use   Smoking status: Never   Smokeless tobacco: Never  Vaping Use   Vaping status: Never Used  Substance and Sexual Activity   Alcohol use: No    Alcohol/week: 0.0 standard drinks of alcohol   Drug use: No   Sexual activity: Yes  Other Topics Concern   Not on file  Social History Narrative   Not on file   Social Determinants of Health   Financial Resource Strain: Low Risk  (09/07/2022)   Overall Financial Resource Strain (CARDIA)    Difficulty of Paying Living Expenses: Not very hard  Food Insecurity: No Food Insecurity (09/07/2022)   Hunger Vital Sign    Worried About Running Out of Food in the Last Year: Never true    Ran Out of Food in the Last Year: Never true  Transportation Needs: No Transportation Needs (09/07/2022)   PRAPARE - Administrator, Civil Service (Medical): No    Lack of Transportation (Non-Medical): No  Physical Activity: Inactive (09/07/2022)   Exercise Vital Sign    Days of Exercise per Week: 0 days    Minutes of Exercise per Session: 0 min  Stress: No Stress Concern Present (09/07/2022)   [employer] of Occupational Health - Occupational Stress Questionnaire    Feeling of Stress : Not at all  Social Connections: Moderately Isolated (09/07/2022)   Social Connection and Isolation Panel [NHANES]    Frequency of Communication with Friends and Family: More than three times a week    Frequency of Social Gatherings with Friends and Family: More than three times a week    Attends Religious Services: More than 4 times per year    Active Member of [employer] or Organizations: No    Attends Banker Meetings: Never    Marital Status: Widowed    Tobacco Counseling Counseling given: Not Answered   Clinical Intake:  Pre-visit preparation  completed: Yes  Pain : No/denies pain     Nutritional Risks: None Diabetes: No  How often do you need to have someone help you when you read instructions, pamphlets, or other written materials from your doctor or pharmacy?: 1 - Never  Interpreter Needed?: No  Information entered by :: Kennedy Bucker, LPN   Activities of Daily Living    09/07/2022   11:06 AM  In your present state of health, do you have any difficulty performing the following activities:  Hearing? 0  Vision? 0  Difficulty concentrating or making decisions? 0  Walking or climbing stairs? 0  Dressing or bathing? 0  Doing errands, shopping? 0  Preparing Food and eating ? N  Using the Toilet? N  In the past six months, have you accidently leaked urine? N  Do you have problems with loss of bowel control? N  Managing your Medications? N  Managing your Finances? N  Housekeeping or managing your Housekeeping? N    Patient Care Team: Marjie Skiff, NP as PCP - General (Nurse Practitioner) Donato Heinz, MD (Orthopedic Surgery) Marlowe Sax, RN as Case Manager (General Practice) Gustavus Bryant, LCSW as Social Worker (Licensed Clinical Social Worker) Kemper Durie, RN as [facility] Therapist, music  Indicate any recent Medical Services you may have received from other than [facility] providers in the past year (date may be approximate).     Assessment:   This is a routine wellness examination for Carol [name].  Hearing/Vision screen Hearing Screening - Comments:: No aids Vision Screening - Comments:: Readers- [facility]  Dietary issues and exercise activities discussed:     Goals Addressed             This Visit's Progress    DIET - EAT MORE FRUITS AND VEGETABLES         Depression Screen    09/07/2022   11:03 AM 08/30/2022   11:07 AM 05/11/2022   10:40 AM 02/09/2022   12:02 PM 02/09/2022   10:28 AM 01/29/2022    4:23 PM 11/09/2021    9:28 AM  PHQ 2/9 Scores  PHQ - 2 Score 0   0    0  PHQ- 9 Score 0   0   0  Exception Documentation  Patient refusal Patient refusal  Patient refusal Patient refusal     Fall Risk    09/07/2022   11:06 AM 08/30/2022   11:07 AM 02/09/2022   10:27 AM 01/29/2022    4:23 PM 11/09/2021    9:28 AM  Fall Risk   Falls in the past year? 0 0 0 -- 0  Comment    Patinet refused   Number falls in past yr: 0 0 0  0  Injury with Fall? 0 0 0  0  Risk for fall due to : No Fall Risks No Fall Risks No Fall Risks  No Fall Risks  Follow up Falls prevention discussed;Falls evaluation completed Falls evaluation completed Falls evaluation completed  Falls evaluation completed    MEDICARE RISK AT HOME:  Medicare Risk at Home - 09/07/22 1106     Any stairs in or around the home? Yes    If so, are there any without handrails? No    Home free of loose throw rugs in walkways, pet beds, electrical cords, etc? Yes    Adequate lighting in your home to reduce risk of falls? Yes    Life alert? No    Use of a cane, walker or w/c? No    Grab bars in the bathroom? No    Shower chair or bench in shower? No    Elevated toilet seat or a handicapped toilet? No             TIMED UP AND GO:  Was the test performed?  No    Cognitive Function:        09/07/2022   11:09 AM 09/01/2021   11:07 AM 08/25/2020   11:19 AM 08/13/2019   11:22 AM 01/31/2017    9:04 AM  6CIT Screen  What Year? 0 points 0 points 0 points 0 points 0 points  What month? 0 points 0 points 0 points 0 points 0 points  What time? 0 points 0 points 0 points 0 points 0 points  Count back from 20 0 points 0 points 0 points 0 points 0 points  Months in reverse 0 points 0 points 0 points 4 points 0 points  Repeat phrase 0 points 0 points 0 points 0 points 0 points  Total Score 0 points 0 points 0 points 4 points 0 points    Immunizations Immunization History  Administered Date(s) Administered   Fluad Quad(high Dose 65+) 01/02/2019, 11/27/2019, 02/03/2021, 11/09/2021   Influenza, High  Dose Seasonal PF 12/21/2017   Influenza,inj,Quad PF,6+ Mos 01/27/2015, 02/13/2016   Janssen (J&J) SARS-COV-2 Vaccination 05/04/2019   Td 11/17/2004    TDAP status: Due, Education has been provided regarding the importance of this vaccine. Advised may receive this vaccine at local pharmacy or Health Dept. Aware to provide a copy of the vaccination record if obtained from local pharmacy or Health Dept. Verbalized acceptance and understanding.  Flu Vaccine status: Up to date  Pneumococcal vaccine status: Declined,  Education has been provided regarding the importance of this vaccine but patient still declined. Advised may receive this vaccine at local pharmacy or Health Dept. Aware to provide a copy of the vaccination record if obtained from local pharmacy or Health Dept. Verbalized acceptance and understanding.   Covid-19 vaccine status: Declined, Education has been provided regarding the importance of this vaccine but patient still declined. Advised may receive this vaccine at local pharmacy or Health Dept.or vaccine clinic. Aware to provide a copy of the vaccination record if obtained from local pharmacy or Health Dept. Verbalized acceptance and understanding.  Qualifies for Shingles Vaccine? Yes   Zostavax completed No   Shingrix Completed?: No.    Education has been provided regarding the importance of this vaccine. Patient has been advised to call insurance company to determine out of pocket expense if they have not yet received this vaccine. Advised may also receive vaccine at local pharmacy or Health Dept. Verbalized acceptance and understanding.  Screening Tests Health Maintenance  Topic Date Due   DTaP/Tdap/Td (2 - Tdap) 11/18/2014   COVID-19 Vaccine (2 - 2023-24 season) 09/15/2022 (Originally 10/16/2021)   Zoster Vaccines- Shingrix (1 of 2) 11/30/2022 (Originally 05/24/2001)   COLON CANCER SCREENING ANNUAL FOBT  03/02/2023 (Originally 05/21/2022)   Pneumonia Vaccine 81+ Years old (1 of 1 -  PCV) 05/11/2023 (Originally 05/24/2016)   INFLUENZA VACCINE  09/16/2022   Medicare Annual Wellness (AWV)  09/07/2023   MAMMOGRAM  02/27/2024   DEXA SCAN  05/01/2028   Hepatitis C Screening  Completed   HPV VACCINES  Aged Out   Colonoscopy  Discontinued    Health Maintenance  Health Maintenance Due  Topic Date Due   DTaP/Tdap/Td (2 - Tdap) 11/18/2014    Colorectal cancer screening: Type of screening: FOBT/FIT. Completed 05/20/21. Repeat every 1 years  Mammogram status: Completed 02/26/22. Repeat every year  Bone Density status: Completed 05/02/18. Results reflect: Bone density results: OSTEOPENIA. Repeat every 5 years.  Lung Cancer Screening: (Low Dose CT Chest recommended if Age 74-80 years, 20 pack-year currently smoking OR have quit w/in 15years.) does not qualify.   Additional Screening:  Hepatitis C Screening: does qualify; Completed 01/27/15  Vision Screening: Recommended annual ophthalmology exams for early detection of glaucoma and other disorders of the eye. Is the patient up to date with their annual eye exam?  Yes  Who is the provider or what is the  name of the office in which the patient attends annual eye exams? [facility] If pt is not established with a provider, would they like to be referred to a provider to establish care? No .   Dental Screening: Recommended annual dental exams for proper oral hygiene   Community Resource Referral / Chronic Care Management: CRR required this visit?  No   CCM required this visit?  No     Plan:     I have personally reviewed and noted the following in the patient's chart:   Medical and social history Use of alcohol, tobacco or illicit drugs  Current medications and supplements including opioid prescriptions. Patient is not currently taking opioid prescriptions. Functional ability and status Nutritional status Physical activity Advanced directives List of other physicians Hospitalizations, surgeries, and ER visits in  previous 12 months Vitals Screenings to include cognitive, depression, and falls Referrals and appointments  In addition, I have reviewed and discussed with patient certain preventive protocols, quality metrics, and best practice recommendations. A written personalized care plan for preventive services as well as general preventive health recommendations were provided to patient.     Hal Hope, LPN   10/14/5619   After Visit Summary: (MyChart) Due to this being a telephonic visit, the after visit summary with patients personalized plan was offered to patient via MyChart   Nurse Notes: none

## 2022-09-07 NOTE — Patient Instructions (Signed)
Carol [name] , Thank you for taking time to come for your Medicare Wellness Visit. I appreciate your ongoing commitment to your health goals. Please review the following plan we discussed and let me know if I can assist you in the future.   These are the goals we discussed:  Goals       DIET - EAT MORE FRUITS AND VEGETABLES      DIET - INCREASE WATER INTAKE      Recommend drinking at least 6-8 glasses of water a day       Patient Stated      08/13/2019, wants to exercise more      Patient Stated      08/25/2020, hope to be able to do new job      Patient Stated      Continue current lifestyle      SW-Track and Manage My Symptoms-Stress, Anxiety and Depression       Timeframe:  Long-Range Goal Priority:  Medium Start Date:   05/12/20                      Expected End Date:  08/12/20                     Follow Up Date- 06/26/20  Current Barriers:  Acute Mental Health needs related to Caregiver strain, self -care and stress management Financial constraints related to managing health care expenses Limited social support Limited access to food Level of care concerns ADL IADL limitations Social Isolation Limited access to caregiver Suicidal Ideation/Homicidal Ideation: No  Clinical Social Work Goal(s):   Over the next 120 days, patient/caregiver will work with SW to address concerns related to caregiver strain/community resource connection. LCSW will assist patient in gaining additional support/resource connection and community resource education in order to maintain health and mental health appropriately  Over the next 120 days, patient will demonstrate improved adherence to self care as evidenced by implementing healthy self-care into her daily routine such as: attending all medical appointments, deep breathing exercises, taking time for self-reflection, taking medications as prescribed, drinking water and daily exercise to improve mobility and mood.  Over the next 120 days, patient  will work with SW by telephone or in person to reduce or manage symptoms related to stress and anxiety Over the next 120 days, patient will demonstrate improved health management independence as evidenced by implementing healthy self-care skills and positive support/resources into her daily routine to help cope with stressors and improve overall health and well-being  Over the next 120 days, patient or caregiver will verbalize basic understanding of depression/stress process and self health management plan as evidenced by her participation in development of long term plan of care and institution of self health management strategies   Interventions: Patient interviewed and appropriate assessments performed: brief mental health assessment. Patient's spouse passed away from cancer in 2020-03-14. Patient is agreeable to grief therapy referral at this time through [facility]. CCM LCSW completed call to [facility] but was unable to reach any of the therapist directly. Front desk advised CCM LCSW to leave referral on their voice mail. CCM LCSW successfully left referral on their voice mail box on 05/12/20. CCM LCSW ask that they return call to confirm that they received referral.   Patient was provided positive reinforcement for decreasing her A1C to 5.4. Patient reports implementing appropriate self-care habits into her daily routine such as: drinking water, staying active around the  house, taking her medications as prescribed, combating negative thoughts or emotions and staying connected with her family and friends. Positive reinforcement provided.  Patient confirms that she will be able to attend PCP appointment on 05/22/20. She confirms stable transportation for this as well.  Patient reports that her daughters resides with her and is able to assist her with her care giving needs. However, daughter works throughout the day which leaves patient alone in the home from morning to evening.  Patient reports  stable transportation to upcoming medical appointments. Patient does not use her blood pressure monitor at home to check her readings as this is very stressful for her and causes her great anxiety after getting the result. Patient physically comes into PCP office every 2-3 weeks to check blood pressure.  Patient reports having a recent successful cataract surgery.  Provided counseling with regard to grief support. Patient admits to frequent stress and occasional depressive symptoms but denied the need for mental health support other than grief therapy on 05/12/20. LCSW discussed coping skills for stress management. SW used empathetic and active and reflective listening, validated patient's feelings/concerns, and provided emotional support. LCSW provided self-care education to help manage her multiple health conditions and improve her mood.  Provided patient with information about available caregiver support groups and programs within [facility].  Discussed plans with patient for ongoing care management follow up and provided patient with direct contact information for care management team Past C3 referral made for food support. Patient declined needing this support when C3 Guide contacted her. Patient reports that she received a $50.00 gift card from [facility] to get groceries which helped family.  Assisted patient/caregiver with obtaining information about health plan benefits Provided education and assistance to client regarding Advanced Directives. Provided education to patient/caregiver regarding level of care options. Patient was informed that current CCM LCSW will be leaving position next month and her next CCM Social Work follow up visit will be with another LCSW. Patient was appreciative of support provided and receptive to news Provided education to patient/caregiver about Hospice and/or Palliative Care services Emotional/Supportive Counseling provided throughout entire session.  Patient  reports that her main support network includes: her daughter, sisters, friends and church family.  Patient denies any recent dizziness (previous concern)  Patient Self Care Activities:  Calls provider office for new concerns or questions Ability for insight Independent living Motivation for treatment Strong family or social support  Patient Coping Strengths:  Supportive Relationships Spirituality Hopefulness Self Advocate Able to Communicate Effectively  Patient Self Care Deficits:  Lacks social connections      SW: "I am managing my stress as best as I can." (pt-stated)      Current Barriers:  Acute Mental Health needs related to Caregiver strain, self -care and stress management Financial constraints related to managing health care expenses Limited social support Limited access to food Level of care concerns ADL IADL limitations Social Isolation Limited access to caregiver Suicidal Ideation/Homicidal Ideation: No  Clinical Social Work Goal(s):   Over the next 120 days, patient/caregiver will work with SW to address concerns related to caregiver strain/community resource connection. LCSW will assist patient in gaining additional support/resource connection and community resource education in order to maintain health and mental health appropriately  Over the next 120 days, patient will demonstrate improved adherence to self care as evidenced by implementing healthy self-care into her daily routine such as: attending all medical appointments, deep breathing exercises, taking time for self-reflection, taking medications as prescribed, drinking water  and daily exercise to improve mobility and mood.  Over the next 120 days, patient will work with SW by telephone or in person to reduce or manage symptoms related to stress and anxiety Over the next 120 days, patient will demonstrate improved health management independence as evidenced by implementing healthy self-care skills and  positive support/resources into her daily routine to help cope with stressors and improve overall health and well-being  Over the next 120 days, patient or caregiver will verbalize basic understanding of depression/stress process and self health management plan as evidenced by her participation in development of long term plan of care and institution of self health management strategies   Interventions: Patient interviewed and appropriate assessments performed: brief mental health assessment Patient reports stable transportation to upcoming PCP appointment to monitor her blood pressure. Patient does not use her blood pressure monitor at home to check her readings as this is very stressful for her and causes her great anxiety after getting the result. Patient physically comes into PCP office every 2-3 weeks to check blood pressure.  Patient reports having a recent successful cataract surgery.  Provided counseling with regard to active caregiver strain and stress. Patient reports that [facility]osp[facility] is still involved which has been a great additional support for family. LCSW discussed coping skills for stress management. SW used empathetic and active and reflective listening, validated patient's feelings/concerns, and provided emotional support. LCSW provided self-care education to help manage her multiple health conditions and improve her mood.  Provided patient with information about available caregiver support groups and programs within [facility].  Discussed plans with patient for ongoing care management follow up and provided patient with direct contact information for care management team Past C3 referral made for food support. Patient declined needing this support when C3 Guide contacted her. Patient reports that she received a $50.00 gift card from [facility] to get groceries which helped family.  Assisted patient/caregiver with obtaining information about health plan benefits Provided education and  assistance to client regarding Advanced Directives. Provided education to patient/caregiver regarding level of care options. Provided education to patient/caregiver about Hospice and/or Palliative Care services Emotional/Supportive Counseling provided throughout entire session.   Patient Self Care Activities:  Calls provider office for new concerns or questions Ability for insight Independent living Motivation for treatment Strong family or social support  Patient Coping Strengths:  Supportive Relationships Spirituality Hopefulness Self Advocate Able to Communicate Effectively  Patient Self Care Deficits:  Lacks social connections  Initial goal documentation          This is a list of the screening recommended for you and due dates:  Health Maintenance  Topic Date Due   DTaP/Tdap/Td vaccine (2 - Tdap) 11/18/2014   COVID-19 Vaccine (2 - 2023-24 season) 09/15/2022*   Zoster (Shingles) Vaccine (1 of 2) 11/30/2022*   Stool Blood Test  03/02/2023*   Pneumonia Vaccine (1 of 1 - PCV) 05/11/2023*   Flu Shot  09/16/2022   Medicare Annual Wellness Visit  09/07/2023   Mammogram  02/27/2024   DEXA scan (bone density measurement)  05/01/2028   Hepatitis C Screening  Completed   HPV Vaccine  Aged Out   Colon Cancer Screening  Discontinued  *Topic was postponed. The date shown is not the original due date.    Advanced directives: no  Conditions/risks identified: none  Next appointment: Follow up in one year for your annual wellness visit 09/13/23 @ 11:00 am by phone   Preventive Care 65 Years and Older, Female Preventive  care refers to lifestyle choices and visits with your health care provider that can promote health and wellness. What does preventive care include? A yearly physical exam. This is also called an annual well check. Dental exams once or twice a year. Routine eye exams. Ask your health care provider how often you should have your eyes checked. Personal  lifestyle choices, including: Daily care of your teeth and gums. Regular physical activity. Eating a healthy diet. Avoiding tobacco and drug use. Limiting alcohol use. Practicing safe sex. Taking low-dose aspirin every day. Taking vitamin and mineral supplements as recommended by your health care provider. What happens during an annual well check? The services and screenings done by your health care provider during your annual well check will depend on your age, overall health, lifestyle risk factors, and family history of disease. Counseling  Your health care provider may ask you questions about your: Alcohol use. Tobacco use. Drug use. Emotional well-being. Home and relationship well-being. Sexual activity. Eating habits. History of falls. Memory and ability to understand (cognition). Work and work Astronomer. Reproductive health. Screening  You may have the following tests or measurements: Height, weight, and BMI. Blood pressure. Lipid and cholesterol levels. These may be checked every 5 years, or more frequently if you are over 51 years old. Skin check. Lung cancer screening. You may have this screening every year starting at age 28 if you have a 30-pack-year history of smoking and currently smoke or have quit within the past 15 years. Fecal occult blood test (FOBT) of the stool. You may have this test every year starting at age 22. Flexible sigmoidoscopy or colonoscopy. You may have a sigmoidoscopy every 5 years or a colonoscopy every 10 years starting at age 73. Hepatitis C blood test. Hepatitis B blood test. Sexually transmitted disease (STD) testing. Diabetes screening. This is done by checking your blood sugar (glucose) after you have not eaten for a while (fasting). You may have this done every 1-3 years. Bone density scan. This is done to screen for osteoporosis. You may have this done starting at age 36. Mammogram. This may be done every 1-2 years. Talk to your  health care provider about how often you should have regular mammograms. Talk with your health care provider about your test results, treatment options, and if necessary, the need for more tests. Vaccines  Your health care provider may recommend certain vaccines, such as: Influenza vaccine. This is recommended every year. Tetanus, diphtheria, and acellular pertussis (Tdap, Td) vaccine. You may need a Td booster every 10 years. Zoster vaccine. You may need this after age 79. Pneumococcal 13-valent conjugate (PCV13) vaccine. One dose is recommended after age 36. Pneumococcal polysaccharide (PPSV23) vaccine. One dose is recommended after age 90. Talk to your health care provider about which screenings and vaccines you need and how often you need them. This information is not intended to replace advice given to you by your health care provider. Make sure you discuss any questions you have with your health care provider. Document Released: 02/28/2015 Document Revised: 10/22/2015 Document Reviewed: 12/03/2014 Elsevier Interactive Patient Education  2017 [employer].  Fall Prevention in the Home Falls can cause injuries. They can happen to people of all ages. There are many things you can do to make your home safe and to help prevent falls. What can I do on the outside of my home? Regularly fix the edges of walkways and driveways and fix any cracks. Remove anything that might make you trip as you  walk through a door, such as a raised step or threshold. Trim any bushes or trees on the path to your home. Use bright outdoor lighting. Clear any walking paths of anything that might make someone trip, such as rocks or tools. Regularly check to see if handrails are loose or broken. Make sure that both sides of any steps have handrails. Any raised decks and porches should have guardrails on the edges. Have any leaves, snow, or ice cleared regularly. Use sand or salt on walking paths during winter. Clean  up any spills in your garage right away. This includes oil or grease spills. What can I do in the bathroom? Use night lights. Install grab bars by the toilet and in the tub and shower. Do not use towel bars as grab bars. Use non-skid mats or decals in the tub or shower. If you need to sit down in the shower, use a plastic, non-slip stool. Keep the floor dry. Clean up any water that spills on the floor as soon as it happens. Remove soap buildup in the tub or shower regularly. Attach bath mats securely with double-sided non-slip rug tape. Do not have throw rugs and other things on the floor that can make you trip. What can I do in the bedroom? Use night lights. Make sure that you have a light by your bed that is easy to reach. Do not use any sheets or blankets that are too big for your bed. They should not hang down onto the floor. Have a firm chair that has side arms. You can use this for support while you get dressed. Do not have throw rugs and other things on the floor that can make you trip. What can I do in the kitchen? Clean up any spills right away. Avoid walking on wet floors. Keep items that you use a lot in easy-to-reach places. If you need to reach something above you, use a strong step stool that has a grab bar. Keep electrical cords out of the way. Do not use floor polish or wax that makes floors slippery. If you must use wax, use non-skid floor wax. Do not have throw rugs and other things on the floor that can make you trip. What can I do with my stairs? Do not leave any items on the stairs. Make sure that there are handrails on both sides of the stairs and use them. Fix handrails that are broken or loose. Make sure that handrails are as long as the stairways. Check any carpeting to make sure that it is firmly attached to the stairs. Fix any carpet that is loose or worn. Avoid having throw rugs at the top or bottom of the stairs. If you do have throw rugs, attach them to the  floor with carpet tape. Make sure that you have a light switch at the top of the stairs and the bottom of the stairs. If you do not have them, ask someone to add them for you. What else can I do to help prevent falls? Wear shoes that: Do not have high heels. Have rubber bottoms. Are comfortable and fit you well. Are closed at the toe. Do not wear sandals. If you use a stepladder: Make sure that it is fully opened. Do not climb a closed stepladder. Make sure that both sides of the stepladder are locked into place. Ask someone to hold it for you, if possible. Clearly mark and make sure that you can see: Any grab bars or handrails. First  and last steps. Where the edge of each step is. Use tools that help you move around (mobility aids) if they are needed. These include: Canes. Walkers. Scooters. Crutches. Turn on the lights when you go into a dark area. Replace any light bulbs as soon as they burn out. Set up your furniture so you have a clear path. Avoid moving your furniture around. If any of your floors are uneven, fix them. If there are any pets around you, be aware of where they are. Review your medicines with your doctor. Some medicines can make you feel dizzy. This can increase your chance of falling. Ask your doctor what other things that you can do to help prevent falls. This information is not intended to replace advice given to you by your health care provider. Make sure you discuss any questions you have with your health care provider. Document Released: 11/28/2008 Document Revised: 07/10/2015 Document Reviewed: 03/08/2014 Elsevier Interactive Patient Education  2017 [employer].

## 2022-09-21 ENCOUNTER — Other Ambulatory Visit: Payer: Self-pay | Admitting: Nurse Practitioner

## 2022-09-22 NOTE — Telephone Encounter (Signed)
Requested Prescriptions  Pending Prescriptions Disp Refills   fluticasone (FLONASE) 50 MCG/ACT nasal spray [Pharmacy Med Name: FLUTICASONE PROPIONATE 50 MCG/ACT N] 16 g 0    Sig: USE 2 SPRAYS INTO EACH NOSTRIL ONCE DAILY     Ear, Nose, and Throat: Nasal Preparations - Corticosteroids Passed - 09/21/2022 12:04 PM      Passed - Valid encounter within last 12 months    Recent Outpatient Visits           3 weeks ago Hashimoto's thyroiditis   [place] [facility] [place], [place] T, NP   4 months ago Primary hypertension   [place] [facility] [place], [place] T, NP   7 months ago Primary hypertension   [place] [facility] [place], [place] T, NP   7 months ago Non-seasonal allergic rhinitis due to pollen   [facility] [place], Dorie Rank, NP   10 months ago Primary hypertension   [place] [facility] [place], Dorie Rank, NP       Future Appointments             In 2 months Cannady, Dorie Rank, NP [place] [facility]

## 2022-09-24 DIAGNOSIS — Z961 Presence of intraocular lens: Secondary | ICD-10-CM | POA: Diagnosis not present

## 2022-09-24 DIAGNOSIS — H52223 Regular astigmatism, bilateral: Secondary | ICD-10-CM | POA: Diagnosis not present

## 2022-09-24 DIAGNOSIS — H5201 Hypermetropia, right eye: Secondary | ICD-10-CM | POA: Diagnosis not present

## 2022-09-24 DIAGNOSIS — H47393 Other disorders of optic disc, bilateral: Secondary | ICD-10-CM | POA: Diagnosis not present

## 2022-09-24 DIAGNOSIS — H43812 Vitreous degeneration, left eye: Secondary | ICD-10-CM | POA: Diagnosis not present

## 2022-11-12 ENCOUNTER — Other Ambulatory Visit: Payer: Self-pay | Admitting: Nurse Practitioner

## 2022-11-12 NOTE — Telephone Encounter (Signed)
Requested Prescriptions  Pending Prescriptions Disp Refills   fluticasone (FLONASE) 50 MCG/ACT nasal spray [Pharmacy Med Name: FLUTICASONE PROPIONATE 50 MCG/ACT N] 16 g 2    Sig: USE 2 SPRAYS INTO EACH NOSTRIL ONCE DAILY     Ear, Nose, and Throat: Nasal Preparations - Corticosteroids Passed - 11/12/2022 10:44 AM      Passed - Valid encounter within last 12 months    Recent Outpatient Visits           2 months ago Hashimoto's thyroiditis   [place] [facility] [place], Corrie Dandy T, NP   6 months ago Primary hypertension   [place] [facility] [place], [place] T, NP   9 months ago Primary hypertension   [place] [facility] [place], [place] T, NP   9 months ago Non-seasonal allergic rhinitis due to pollen   [facility] [place], Corrie Dandy T, NP   1 year ago Primary hypertension   [place] [facility] [place], Dorie Rank, NP       Future Appointments             In 2 weeks Cannady, Dorie Rank, NP [place] [facility], [facility]

## 2022-11-12 NOTE — Telephone Encounter (Signed)
This is a duplicate order.  Requested Prescriptions  Refused Prescriptions Disp Refills   fluticasone (FLONASE) 50 MCG/ACT nasal spray 16 g 0    Sig: USE 2 SPRAYS INTO EACH NOSTRIL ONCE DAILY     Ear, Nose, and Throat: Nasal Preparations - Corticosteroids Passed - 11/12/2022 11:12 AM      Passed - Valid encounter within last 12 months    Recent Outpatient Visits           2 months ago Hashimoto's thyroiditis   [place] [facility] [place], Corrie Dandy T, NP   6 months ago Primary hypertension   [place] [facility] [place], [place] T, NP   9 months ago Primary hypertension   [place] [facility] [place], [place] T, NP   9 months ago Non-seasonal allergic rhinitis due to pollen   [place] [facility] [place], Corrie Dandy T, NP   1 year ago Primary hypertension   [place] [facility] [place], Dorie Rank, NP       Future Appointments             In 2 weeks Cannady, Dorie Rank, NP [place] [facility], [facility]

## 2022-11-12 NOTE — Telephone Encounter (Signed)
Medication Refill - Medication: fluticasone (FLONASE) 50 MCG/ACT nasal spray   Has the patient contacted their pharmacy? Yes.    Preferred Pharmacy (with phone number or street name): [facility] - [place], [place] - [address] Phone: [phone]  Fax: [phone]   Has the patient been seen for an appointment in the last year OR does the patient have an upcoming appointment? Yes.    Agent: Please be advised that RX refills may take up to 3 business days. We ask that you follow-up with your pharmacy.  Patient stated she has no more nasal spray and would like to get it today or first thing in the morning

## 2022-11-28 NOTE — Patient Instructions (Incomplete)
One a Day Women's Multivitamin Petites Centrum Minis for Women over 50  Be Involved in Caring For Your Health:  Taking Medications When medications are taken as directed, they can greatly improve your health. But if they are not taken as prescribed, they may not work. In some cases, not taking them correctly can be harmful. To help ensure your treatment remains effective and safe, understand your medications and how to take them. Bring your medications to each visit for review by your provider.  Your lab results, notes, and after visit summary will be available on My Chart. We strongly encourage you to use this feature. If lab results are abnormal the clinic will contact you with the appropriate steps. If the clinic does not contact you assume the results are satisfactory. You can always view your results on My Chart. If you have questions regarding your health or results, please contact the clinic during office hours. You can also ask questions on My Chart.  We at [facility] are grateful that you chose [place] to provide your care. We strive to provide evidence-based and compassionate care and are always looking for feedback. If you get a survey from the clinic please complete this so we can hear your opinions.  Hypothyroidism  Hypothyroidism is when the thyroid gland does not make enough of certain hormones. This is called an underactive thyroid. The thyroid gland is a small gland located in the lower front part of the neck, just in front of the windpipe (trachea). This gland makes hormones that help control how the body uses food for energy (metabolism) as well as how the heart and brain function. These hormones also play a role in keeping your bones strong. When the thyroid is underactive, it produces too little of the hormones thyroxine (T4) and triiodothyronine (T3). What are the causes? This condition may be caused by: Hashimoto's disease. This is a disease in which the body's  disease-fighting system (immune system) attacks the thyroid gland. This is the most common cause. Viral infections. Pregnancy. Certain medicines. Birth defects. Problems with a gland in the center of the brain (pituitary gland). Lack of enough iodine in the diet. Other causes may include: Past radiation treatments to the head or neck for cancer. Past treatment with radioactive iodine. Past exposure to radiation in the environment. Past surgical removal of part or all of the thyroid. What increases the risk? You are more likely to develop this condition if: You are female. You have a family history of thyroid conditions. You use a medicine called lithium. You take medicines that affect the immune system (immunosuppressants). What are the signs or symptoms? Common symptoms of this condition include: Not being able to tolerate cold. Feeling as though you have no energy (lethargy). Lack of appetite. Constipation. Sadness or depression. Weight gain that is not explained by a change in diet or exercise habits. Menstrual irregularity. Dry skin, coarse hair, or brittle nails. Other symptoms may include: Muscle pain. Slowing of thought processes. Poor memory. How is this diagnosed? This condition may be diagnosed based on: Your symptoms, your medical history, and a physical exam. Blood tests. You may also have imaging tests, such as an ultrasound or MRI. How is this treated? This condition is treated with medicine that replaces the thyroid hormones that your body does not make. After you begin treatment, it may take several weeks for symptoms to go away. Follow these instructions at home: Take over-the-counter and prescription medicines only as told by your health  care provider. If you start taking any new medicines, tell your health care provider. Keep all follow-up visits as told by your health care provider. This is important. As your condition improves, your dosage of thyroid  hormone medicine may change. You will need to have blood tests regularly so that your health care provider can monitor your condition. Contact a health care provider if: Your symptoms do not get better with treatment. You are taking thyroid hormone replacement medicine and you: Sweat a lot. Have tremors. Feel anxious. Lose weight rapidly. Cannot tolerate heat. Have emotional swings. Have diarrhea. Feel weak. Get help right away if: You have chest pain. You have an irregular heartbeat. You have a rapid heartbeat. You have difficulty breathing. These symptoms may be an emergency. Get help right away. Call 911. Do not wait to see if the symptoms will go away. Do not drive yourself to the hospital. Summary Hypothyroidism is when the thyroid gland does not make enough of certain hormones (it is underactive). When the thyroid is underactive, it produces too little of the hormones thyroxine (T4) and triiodothyronine (T3). The most common cause is Hashimoto's disease, a disease in which the body's disease-fighting system (immune system) attacks the thyroid gland. The condition can also be caused by viral infections, medicine, pregnancy, or past radiation treatment to the head or neck. Symptoms may include weight gain, dry skin, constipation, feeling as though you do not have energy, and not being able to tolerate cold. This condition is treated with medicine to replace the thyroid hormones that your body does not make. This information is not intended to replace advice given to you by your health care provider. Make sure you discuss any questions you have with your health care provider. Document Revised: 02/03/2021 Document Reviewed: 02/03/2021 Elsevier Patient Education  2024 [employer].

## 2022-11-30 ENCOUNTER — Encounter: Payer: Self-pay | Admitting: Nurse Practitioner

## 2022-11-30 ENCOUNTER — Ambulatory Visit (INDEPENDENT_AMBULATORY_CARE_PROVIDER_SITE_OTHER): Payer: Medicare Other | Admitting: Nurse Practitioner

## 2022-11-30 VITALS — BP 128/80 | HR 86 | Temp 97.9°F | Ht 62.0 in | Wt 158.0 lb

## 2022-11-30 DIAGNOSIS — E782 Mixed hyperlipidemia: Secondary | ICD-10-CM

## 2022-11-30 DIAGNOSIS — R801 Persistent proteinuria, unspecified: Secondary | ICD-10-CM

## 2022-11-30 DIAGNOSIS — E063 Autoimmune thyroiditis: Secondary | ICD-10-CM

## 2022-11-30 DIAGNOSIS — I10 Essential (primary) hypertension: Secondary | ICD-10-CM | POA: Diagnosis not present

## 2022-11-30 DIAGNOSIS — R7309 Other abnormal glucose: Secondary | ICD-10-CM

## 2022-11-30 DIAGNOSIS — Z23 Encounter for immunization: Secondary | ICD-10-CM

## 2022-11-30 LAB — MICROALBUMIN, URINE WAIVED
Creatinine, Urine Waived: 50 mg/dL (ref 10–300)
Microalb, Ur Waived: 30 mg/L — ABNORMAL HIGH (ref 0–19)

## 2022-11-30 MED ORDER — LOSARTAN POTASSIUM 50 MG PO TABS: ORAL_TABLET | ORAL | 4 refills | Status: DC

## 2022-11-30 NOTE — Assessment & Plan Note (Signed)
A1c with trend down recent visit to 5.8%, was 6.3%.  Praised her for this.  Continue diet focus at home.  Recheck today and adjust plan of care as needed.

## 2022-11-30 NOTE — Assessment & Plan Note (Signed)
Chronic, ongoing.  Continues every other day statin which has decreased side effects.  Lipid panel today.

## 2022-11-30 NOTE — Progress Notes (Signed)
BP 128/80 (BP Location: Left Arm, Patient Position: Sitting, Cuff Size: Normal)   Pulse 86   Temp 97.9 F (36.6 C) (Oral)   Ht 5\' 2"  (1.575 m)   Wt 158 lb (71.7 kg)   LMP  (LMP Unknown)   SpO2 97%   BMI 28.90 kg/m    Subjective:    Patient ID: Carol [name], female    DOB: February 18, 1950, 72 y.o.   MRN: [id number]  HPI: Carol [name] is a 72 y.o. female  Chief Complaint  Patient presents with   Hyperlipidemia   Hypertension   Hypothyroidism   HYPERTENSION / HYPERLIPIDEMIA Taking Losartan 25 MG daily for HTN and proteinuria and Lipitor every other day.    A1c elevation past labs, no symptoms and focused on diet -- recent A1c in March 5.8%.   Satisfied with current treatment? yes Duration of hypertension: chronic BP monitoring frequency: not checking BP range: BP medication side effects: no Duration of hyperlipidemia: chronic Cholesterol medication side effects: no Cholesterol supplements: none Medication compliance: good compliance Aspirin: yes Recent stressors: no Recurrent headaches: no Visual changes: no Palpitations: no Dyspnea: no Chest pain: no Lower extremity edema: no Dizzy/lightheaded: no  HASHIMOTO'S THYROID In past she took a low dose Levothyroxine and reported side effects.  Thyroid ultrasound 10/30/19 -- multinodular goiter noted -- ENT ordered -- thyroid biopsy at time which was normal.   Fatigue: no Cold intolerance: no Heat intolerance: no Weight gain: no Weight loss: no Constipation: no Diarrhea/loose stools: no Palpitations: no Lower extremity edema: no Anxiety/depressed mood: no      11/30/2022    8:56 AM 09/07/2022   11:03 AM 02/09/2022   12:02 PM 11/09/2021    9:28 AM 10/13/2021    9:30 AM  Depression screen PHQ 2/9  Decreased Interest 0 0 0 0 0  Down, Depressed, Hopeless 0 0 0 0 0  PHQ - 2 Score 0 0 0 0 0  Altered sleeping 0 0 0 0   Tired, decreased energy 0 0 0 0   Change in appetite 0 0 0 0   Feeling bad or failure about  yourself  0 0 0 0   Trouble concentrating 0 0 0 0   Moving slowly or fidgety/restless 0 0 0 0   Suicidal thoughts 0 0 0 0   PHQ-9 Score 0 0 0 0   Difficult doing work/chores Not difficult at all Not difficult at all Not difficult at all Not difficult at all        11/30/2022    8:56 AM 02/09/2022   12:02 PM 01/29/2022    4:23 PM 11/09/2021    9:28 AM  GAD 7 : Generalized Anxiety Score  Nervous, Anxious, on Edge 0 0 -- 0  Control/stop worrying 0 0  0  Worry too much - different things 0 0  0  Trouble relaxing 0 0  0  Restless 0 0  0  Easily annoyed or irritable 0 0  0  Afraid - awful might happen 0 0  0  Total GAD 7 Score 0 0  0  Anxiety Difficulty Not difficult at all Not difficult at all  Not difficult at all    Relevant past medical, surgical, family and social history reviewed and updated as indicated. Interim medical history since our last visit reviewed. Allergies and medications reviewed and updated.  Review of Systems  Constitutional:  Negative for activity change, appetite change, diaphoresis, fatigue and fever.  Respiratory:  Negative  for cough, chest tightness and shortness of breath.   Cardiovascular:  Negative for chest pain, palpitations and leg swelling.  Gastrointestinal: Negative.   Neurological: Negative.   Psychiatric/Behavioral: Negative.     Per HPI unless specifically indicated above     Objective:    BP 128/80 (BP Location: Left Arm, Patient Position: Sitting, Cuff Size: Normal)   Pulse 86   Temp 97.9 F (36.6 C) (Oral)   Ht 5\' 2"  (1.575 m)   Wt 158 lb (71.7 kg)   LMP  (LMP Unknown)   SpO2 97%   BMI 28.90 kg/m   Wt Readings from Last 3 Encounters:  11/30/22 158 lb (71.7 kg)  09/07/22 154 lb (69.9 kg)  08/30/22 154 lb 6.4 oz (70 kg)    Physical Exam Vitals and nursing note reviewed.  Constitutional:      General: She is awake. She is not in acute distress.    Appearance: She is well-developed and overweight. She is not ill-appearing.   HENT:     Head: Normocephalic.     Right Ear: Hearing, tympanic membrane, ear canal and external ear normal.     Left Ear: Hearing, tympanic membrane, ear canal and external ear normal.  Eyes:     General: Lids are normal.        Right eye: No discharge.        Left eye: No discharge.     Conjunctiva/sclera: Conjunctivae normal.     Pupils: Pupils are equal, round, and reactive to light.  Neck:     Thyroid: Thyromegaly present.     Vascular: No carotid bruit.  Cardiovascular:     Rate and Rhythm: Normal rate and regular rhythm.     Heart sounds: Normal heart sounds. No murmur heard.    No gallop.  Pulmonary:     Effort: Pulmonary effort is normal. No accessory muscle usage or respiratory distress.     Breath sounds: Normal breath sounds.  Abdominal:     General: Bowel sounds are normal.     Palpations: Abdomen is soft.  Musculoskeletal:     Cervical back: Normal range of motion and neck supple.     Right lower leg: No edema.     Left lower leg: No edema.  Lymphadenopathy:     Head:     Right side of head: No submental, submandibular, tonsillar, preauricular or posterior auricular adenopathy.     Left side of head: No submental, submandibular, tonsillar, preauricular or posterior auricular adenopathy.     Cervical: No cervical adenopathy.  Skin:    General: Skin is warm and dry.  Neurological:     Mental Status: She is alert and oriented to person, place, and time.  Psychiatric:        Attention and Perception: Attention normal.        Mood and Affect: Mood normal.        Speech: Speech normal.        Behavior: Behavior normal. Behavior is cooperative.        Thought Content: Thought content normal.    Results for orders placed or performed in visit on 05/11/22  Microalbumin, Urine Waived  Result Value Ref Range   Microalb, Ur Waived 30 (H) 0 - 19 mg/L   Creatinine, Urine Waived 50 10 - 300 mg/dL   Microalb/Creat Ratio 30-300 (H) <30 mg/g  Bayer DCA Hb A1c Waived   Result Value Ref Range   HB A1C (BAYER DCA - WAIVED) 5.8 (H)  4.8 - 5.6 %  T4, free  Result Value Ref Range   Free T4 0.97 0.82 - 1.77 ng/dL  TSH  Result Value Ref Range   TSH 4.830 (H) 0.450 - 4.500 uIU/mL  Lipid Panel w/o Chol/HDL Ratio  Result Value Ref Range   Cholesterol, Total 177 100 - 199 mg/dL   Triglycerides 161 0 - 149 mg/dL   HDL 45 >09 mg/dL   VLDL Cholesterol Cal 19 5 - 40 mg/dL   LDL Chol Calc (NIH) 604 (H) 0 - 99 mg/dL  Comprehensive metabolic panel  Result Value Ref Range   Glucose 90 70 - 99 mg/dL   BUN 14 8 - 27 mg/dL   Creatinine, Ser 5.40 0.57 - 1.00 mg/dL   eGFR 80 >98 JX/BJY/7.82   BUN/Creatinine Ratio 18 12 - 28   Sodium 139 134 - 144 mmol/L   Potassium 3.9 3.5 - 5.2 mmol/L   Chloride 100 96 - 106 mmol/L   CO2 24 20 - 29 mmol/L   Calcium 9.7 8.7 - 10.3 mg/dL   Total Protein 7.1 6.0 - 8.5 g/dL   Albumin 3.9 3.9 - 4.9 g/dL   Globulin, Total 3.2 1.5 - 4.5 g/dL   Albumin/Globulin Ratio 1.2 1.2 - 2.2   Bilirubin Total 0.4 0.0 - 1.2 mg/dL   Alkaline Phosphatase 130 (H) 44 - 121 IU/L   AST 18 0 - 40 IU/L   ALT 12 0 - 32 IU/L  Gamma GT  Result Value Ref Range   GGT 18 0 - 60 IU/L      Assessment & Plan:   Problem List Items Addressed This Visit       Cardiovascular and Mediastinum   Hypertension - Primary    Chronic, stable.  BP initial elevated with machine, she gets very nervous with machine and does not like pain, on recheck manually BP came down to baseline.  Recommend she monitor at least a few times a week at home.  Continue Losartan 25 MG daily and adjust as needed -- she did not tolerate increase in this dose to 50 MG in past and had dizziness.  Focus on DASH diet at home.  Labs today: CBC, CMP, TSH, urine ALB. Return to office in 3 months for BP check and reassurance.        Relevant Medications   losartan (COZAAR) 50 MG tablet   Other Relevant Orders   Comprehensive metabolic panel   CBC with Differential/Platelet     Endocrine    Hashimoto's thyroiditis    Ongoing.  She did not tolerate low dose Levothyroxine in past, but discussed with her may benefit retrial of this in future due to levels on labs and goiter.  Refuses to start.  Educated her on Hashimoto's thyroiditis and effect on thyroid over time.  Recheck TSH and Free T4. Denies any current symptoms.  Normal biopsy in past, but multinodular goiter noted on u/s.      Relevant Orders   T4, free   TSH     Other   Elevated hemoglobin A1c    A1c with trend down recent visit to 5.8%, was 6.3%.  Praised her for this.  Continue diet focus at home.  Recheck today and adjust plan of care as needed.      Relevant Orders   HgB A1c   Microalbumin, Urine Waived   Hyperlipidemia    Chronic, ongoing.  Continues every other day statin which has decreased side effects.  Lipid panel today.  Relevant Medications   losartan (COZAAR) 50 MG tablet   Other Relevant Orders   Comprehensive metabolic panel   Lipid Panel w/o Chol/HDL Ratio   Proteinuria    Ongoing with stable kidney blood work.  Continue Losartan for kidney protection, urine ALB 15 May 2022, improved from previous.  Check CMP and urine ALB today.  Consider kidney ultrasound in future, she refuses today.      Relevant Orders   Microalbumin, Urine Waived   Other Visit Diagnoses     Flu vaccine need       Flu vaccine provided, educated on this.   Relevant Orders   Flu Vaccine Trivalent High Dose (Fluad) (Completed)        Follow up plan: Return in about 3 months (around 03/02/2023) for HTN/HLD, THYROID.

## 2022-11-30 NOTE — Assessment & Plan Note (Signed)
Chronic, stable.  BP initial elevated with machine, she gets very nervous with machine and does not like pain, on recheck manually BP came down to baseline.  Recommend she monitor at least a few times a week at home.  Continue Losartan 25 MG daily and adjust as needed -- she did not tolerate increase in this dose to 50 MG in past and had dizziness.  Focus on DASH diet at home.  Labs today: CBC, CMP, TSH, urine ALB. Return to office in 3 months for BP check and reassurance.

## 2022-11-30 NOTE — Assessment & Plan Note (Addendum)
Ongoing.  She did not tolerate low dose Levothyroxine in past, but discussed with her may benefit retrial of this in future due to levels on labs and goiter.  Refuses to start.  Educated her on Hashimoto's thyroiditis and effect on thyroid over time.  Recheck TSH and Free T4. Denies any current symptoms.  Normal biopsy in past, but multinodular goiter noted on u/s.

## 2022-11-30 NOTE — Assessment & Plan Note (Signed)
Ongoing with stable kidney blood work.  Continue Losartan for kidney protection, urine ALB 15 May 2022, improved from previous.  Check CMP and urine ALB today.  Consider kidney ultrasound in future, she refuses today.

## 2022-12-01 LAB — HEMOGLOBIN A1C
Est. average glucose Bld gHb Est-mCnc: 126 mg/dL
Hgb A1c MFr Bld: 6 % — ABNORMAL HIGH (ref 4.8–5.6)

## 2022-12-01 LAB — CBC WITH DIFFERENTIAL/PLATELET
Basophils Absolute: 0 10*3/uL (ref 0.0–0.2)
Basos: 1 %
EOS (ABSOLUTE): 0.1 10*3/uL (ref 0.0–0.4)
Eos: 2 %
Hematocrit: 41.2 % (ref 34.0–46.6)
Hemoglobin: 13.2 g/dL (ref 11.1–15.9)
Immature Grans (Abs): 0 10*3/uL (ref 0.0–0.1)
Immature Granulocytes: 0 %
Lymphocytes Absolute: 1.6 10*3/uL (ref 0.7–3.1)
Lymphs: 43 %
MCH: 29.2 pg (ref 26.6–33.0)
MCHC: 32 g/dL (ref 31.5–35.7)
MCV: 91 fL (ref 79–97)
Monocytes Absolute: 0.5 10*3/uL (ref 0.1–0.9)
Monocytes: 13 %
Neutrophils Absolute: 1.5 10*3/uL (ref 1.4–7.0)
Neutrophils: 41 %
Platelets: 511 10*3/uL — ABNORMAL HIGH (ref 150–450)
RBC: 4.52 x10E6/uL (ref 3.77–5.28)
RDW: 12.3 % (ref 11.7–15.4)
WBC: 3.8 10*3/uL (ref 3.4–10.8)

## 2022-12-01 LAB — COMPREHENSIVE METABOLIC PANEL
ALT: 10 [IU]/L (ref 0–32)
AST: 20 [IU]/L (ref 0–40)
Albumin: 4.3 g/dL (ref 3.8–4.8)
Alkaline Phosphatase: 138 [IU]/L — ABNORMAL HIGH (ref 44–121)
BUN/Creatinine Ratio: 12 (ref 12–28)
BUN: 10 mg/dL (ref 8–27)
Bilirubin Total: 0.2 mg/dL (ref 0.0–1.2)
CO2: 25 mmol/L (ref 20–29)
Calcium: 10 mg/dL (ref 8.7–10.3)
Chloride: 99 mmol/L (ref 96–106)
Creatinine, Ser: 0.83 mg/dL (ref 0.57–1.00)
Globulin, Total: 3.5 g/dL (ref 1.5–4.5)
Glucose: 88 mg/dL (ref 70–99)
Potassium: 4 mmol/L (ref 3.5–5.2)
Sodium: 139 mmol/L (ref 134–144)
Total Protein: 7.8 g/dL (ref 6.0–8.5)
eGFR: 75 mL/min/{1.73_m2} (ref >59)

## 2022-12-01 LAB — LIPID PANEL W/O CHOL/HDL RATIO
Cholesterol, Total: 175 mg/dL (ref 100–199)
HDL: 51 mg/dL (ref >39)
LDL Chol Calc (NIH): 102 mg/dL — ABNORMAL HIGH (ref 0–99)
Triglycerides: 123 mg/dL (ref 0–149)
VLDL Cholesterol Cal: 22 mg/dL (ref 5–40)

## 2022-12-01 LAB — T4, FREE: Free T4: 0.93 ng/dL (ref 0.82–1.77)

## 2022-12-01 LAB — TSH: TSH: 7.29 u[IU]/mL — ABNORMAL HIGH (ref 0.450–4.500)

## 2022-12-01 NOTE — Progress Notes (Signed)
Contacted via MyChart: but please  call as I am sure she does not check regularly:)  Good afternoon Carol [name], your labs have returned: - A1c remains in prediabetic range at 6%, trended up a little from last 5.8%.  Focus heavily on healthy diet choices and regular exercise to prevent this from getting to 6.5% or greater. - Lipid panel shows mild elevation in LDL (bad cholesterol), continue Atorvastatin as ordered, but may change to Rosuvastatin in future to see if better control with this every other day. - Thyroid, your TSH has trended up.  Free T4 normal.  I do recommend a very low dose of Levothyroxine due to inflammation of your thyroid.  Do you want to try this again? - CBC shows no anemia or infection, but platelets remain a tad high -- I do recommend taking a baby aspirin daily.  We will monitor these. - Kidney and liver function are normal.  Any questions? Keep being excellent!!  Thank you for allowing me to participate in your care.  I appreciate you. Kindest regards, Lil Lepage

## 2022-12-28 ENCOUNTER — Ambulatory Visit: Payer: Self-pay

## 2022-12-28 NOTE — Telephone Encounter (Signed)
Message from Wilton M sent at 12/28/2022  9:44 AM EST  Summary: swelling in left wrist   Pt stated she is experiencing swelling in left wrist when trying to pick up something she is having pain. Pt declined to see anyone else. Please advise.         Chief Complaint: left wrist pain  Symptoms: mild swelling Frequency: 2 days Disposition: [] ED /[] Urgent Care (no appt availability in office) / [] Appointment(In office/virtual)/ []  [place] [facility]/ [] Home Care/ [] Refused Recommended Disposition /[] [facility]/ [x]  Follow-up with PCP Additional Notes: pt refused appt with another provider. Adamant about wanting to see PCP.  Advised pt no openings this week with PCP.  Reason for Disposition  [1] MODERATE pain (e.g., interferes with normal activities) AND [2] present > 3 days  Answer Assessment - Initial Assessment Questions 1. ONSET: "When did the pain start?"     2 days 2. LOCATION: "Where is the pain located?"     Left wrist 3. PAIN: "How bad is the pain?" (Scale 1-10; or mild, moderate, severe)   - MILD (1-3): doesn't interfere with normal activities   - MODERATE (4-7): interferes with normal activities (e.g., work or school) or awakens from sleep   - SEVERE (8-10): excruciating pain, unable to use hand at all     mild 4. WORK OR EXERCISE: "Has there been any recent work or exercise that involved this part (i.e., hand or wrist) of the body?"     no 5. CAUSE: "What do you think is causing the pain?"     unsure 7. OTHER SYMPTOMS: "Do you have any other symptoms?" (e.g., neck pain, swelling, rash, numbness, fever)     swelling  Protocols used: Hand and Wrist Pain-A-AH

## 2022-12-28 NOTE — Telephone Encounter (Signed)
Routing to provider to advise.  

## 2022-12-28 NOTE — Telephone Encounter (Signed)
Called patient and she stated that she could come in tomorrow at 4:20 pm with Jolene.

## 2022-12-29 ENCOUNTER — Ambulatory Visit (INDEPENDENT_AMBULATORY_CARE_PROVIDER_SITE_OTHER): Payer: Medicare Other | Admitting: Nurse Practitioner

## 2022-12-29 ENCOUNTER — Encounter: Payer: Self-pay | Admitting: Nurse Practitioner

## 2022-12-29 VITALS — BP 132/78 | HR 84 | Temp 98.7°F | Ht 62.0 in | Wt 160.4 lb

## 2022-12-29 DIAGNOSIS — M25532 Pain in left wrist: Secondary | ICD-10-CM

## 2022-12-29 NOTE — Patient Instructions (Signed)
Wrist Pain, Adult There are many things that can cause wrist pain. Some common causes include: An injury to the wrist area, such as a sprain, strain, or broken bone (fracture). Overuse of the joint. A condition that causes increased pressure on a nerve in the wrist (carpal tunnel syndrome). Wear and tear of the joints that occurs with aging (osteoarthritis). Other types of joint inflammation and stiffness (arthritis). Sometimes, the cause of wrist pain is not known. Often, the pain goes away when you follow instructions from your health care provider for relieving pain at home. This may include resting the wrist, icing the wrist, or using a splint or an elastic wrap for a short time. If your wrist pain continues, it is important to tell your provider. Follow these instructions at home: If you have a removable splint or elastic wrap: Wear the splint or wrap as told by your provider. Remove it only as told by your provider. Ask your provider if you may remove it for bathing. Check the skin around the splint or wrap every day. Tell your provider about any concerns. Loosen the splint or wrap if your fingers tingle, become numb, or turn cold and blue. Keep the splint or wrap clean. If the splint or wrap is not waterproof: Do not let it get wet. Cover it with a watertight covering when you take a bath or shower. Managing pain, stiffness, and swelling  If told, put ice on the painful area. If you have a removable splint or wrap, remove it as told by your provider. Put ice in a plastic bag. Place a towel between your skin and the bag or between your splint or wrap and the bag. Leave the ice on for 20 minutes, 2-3 times a day. If your skin turns bright red, remove the ice right away to prevent skin damage. The risk of damage is higher if you cannot feel pain, heat, or cold. Move your fingers often to reduce stiffness and swelling. Raise (elevate) the injured area above the level of your heart while  you are sitting or lying down. Activity Rest your affected wrist as told by your provider. Return to your normal activities as told by your provider. Ask your provider what activities are safe for you. Ask your provider when it is safe to drive if you have a splint or wrap on your wrist. Do exercises as told by your provider. General instructions Pay attention to any changes in your symptoms. Take over-the-counter and prescription medicines only as told by your provider. Contact a health care provider if: You have a sudden, sharp pain in the wrist, hand, or arm that is different or new. Any swelling or bruising on your wrist or hand gets worse. Your skin becomes red, has a rash, or has open sores. Your pain does not get better or it gets worse. You have a fever or chills. Get help right away if: You lose feeling in your fingers or hand. Your fingers turn white, very red, or cold and blue. You cannot move your fingers. This information is not intended to replace advice given to you by your health care provider. Make sure you discuss any questions you have with your health care provider. Document Revised: 11/06/2021 Document Reviewed: 11/06/2021 Elsevier Patient Education  2024 [employer].

## 2022-12-29 NOTE — Progress Notes (Signed)
BP 132/78 (BP Location: Left Arm, Patient Position: Sitting, Cuff Size: Normal)   Pulse 84   Temp 98.7 F (37.1 C) (Oral)   Ht 5\' 2"  (1.575 m)   Wt 160 lb 6.4 oz (72.8 kg)   LMP  (LMP Unknown)   SpO2 97%   BMI 29.34 kg/m    Subjective:    Patient ID: Carol [name], female    DOB: [birth year], 72 y.o.   MRN: [id number]  HPI: Carol [name] is a 72 y.o. female  Chief Complaint  Patient presents with   Joint Swelling    Patient states she has been having pain and swelling in her L wrist for the past few days. States she feels more pain when trying to move the area or pick something up.    WRIST PAIN (LEFT) Started 3-4 days ago.  She does not recall any injuries.  Has bruise to upper arm as well, does not recall how it got there.  Has been doing a lot of cleaning in back yard.  Last DEXA was normal. Duration: days Involved wrist: left Mechanism of injury:  unknown Location: diffuse Onset: sudden Severity: 4/10 -- at night is worse Quality:  dull, aching, and throbbing Frequency: intermittent Radiation: no Aggravating factors: movement and sleeping  Alleviating factors: APAP and rest , Mobic, and red arthritis cream + arthritis glove Status: stable Treatments attempted: as above    Relief with NSAIDs?:  No NSAIDs Taken Weakness: no Numbness: no Redness: no Bruising: no Swelling: yes Fevers: no   Relevant past medical, surgical, family and social history reviewed and updated as indicated. Interim medical history since our last visit reviewed. Allergies and medications reviewed and updated.  Review of Systems  Constitutional:  Negative for activity change, appetite change, diaphoresis, fatigue and fever.  Respiratory:  Negative for cough, chest tightness and shortness of breath.   Cardiovascular:  Negative for chest pain, palpitations and leg swelling.  Gastrointestinal: Negative.   Musculoskeletal:  Positive for arthralgias.  Neurological: Negative.    Psychiatric/Behavioral: Negative.      Per HPI unless specifically indicated above     Objective:    BP 132/78 (BP Location: Left Arm, Patient Position: Sitting, Cuff Size: Normal)   Pulse 84   Temp 98.7 F (37.1 C) (Oral)   Ht 5\' 2"  (1.575 m)   Wt 160 lb 6.4 oz (72.8 kg)   LMP  (LMP Unknown)   SpO2 97%   BMI 29.34 kg/m   Wt Readings from Last 3 Encounters:  12/29/22 160 lb 6.4 oz (72.8 kg)  11/30/22 158 lb (71.7 kg)  09/07/22 154 lb (69.9 kg)    Physical Exam Vitals and nursing note reviewed.  Constitutional:      General: She is awake. She is not in acute distress.    Appearance: She is well-developed and overweight. She is not ill-appearing.  HENT:     Head: Normocephalic.     Right Ear: Hearing, tympanic membrane, ear canal and external ear normal.     Left Ear: Hearing, tympanic membrane, ear canal and external ear normal.  Eyes:     General: Lids are normal.        Right eye: No discharge.        Left eye: No discharge.     Conjunctiva/sclera: Conjunctivae normal.     Pupils: Pupils are equal, round, and reactive to light.  Neck:     Thyroid: Thyromegaly present.     Vascular: No carotid  bruit.  Cardiovascular:     Rate and Rhythm: Normal rate and regular rhythm.     Heart sounds: Normal heart sounds. No murmur heard.    No gallop.  Pulmonary:     Effort: Pulmonary effort is normal. No accessory muscle usage or respiratory distress.     Breath sounds: Normal breath sounds.  Abdominal:     General: Bowel sounds are normal.     Palpations: Abdomen is soft.  Musculoskeletal:     Right wrist: Normal.     Left wrist: Swelling (mild) and tenderness (to upper aspect of wrist) present. No lacerations, bony tenderness, snuff box tenderness or crepitus. Normal range of motion. Normal pulse.     Right hand: Normal.     Left hand: Normal.     Cervical back: Normal range of motion and neck supple.     Right lower leg: No edema.     Left lower leg: No edema.      Comments: Strength bilateral normal.  Small light purple bruise to upper left arm, bicep area. No tenderness.  Lymphadenopathy:     Head:     Right side of head: No submental, submandibular, tonsillar, preauricular or posterior auricular adenopathy.     Left side of head: No submental, submandibular, tonsillar, preauricular or posterior auricular adenopathy.     Cervical: No cervical adenopathy.  Skin:    General: Skin is warm and dry.  Neurological:     Mental Status: She is alert and oriented to person, place, and time.  Psychiatric:        Attention and Perception: Attention normal.        Mood and Affect: Mood normal.        Speech: Speech normal.        Behavior: Behavior normal. Behavior is cooperative.        Thought Content: Thought content normal.    Results for orders placed or performed in visit on 11/30/22  HgB A1c  Result Value Ref Range   Hgb A1c MFr Bld 6.0 (H) 4.8 - 5.6 %   Est. average glucose Bld gHb Est-mCnc 126 mg/dL  T4, free  Result Value Ref Range   Free T4 0.93 0.82 - 1.77 ng/dL  Comprehensive metabolic panel  Result Value Ref Range   Glucose 88 70 - 99 mg/dL   BUN 10 8 - 27 mg/dL   Creatinine, Ser 9.56 0.57 - 1.00 mg/dL   eGFR 75 >21 HY/QMV/7.84   BUN/Creatinine Ratio 12 12 - 28   Sodium 139 134 - 144 mmol/L   Potassium 4.0 3.5 - 5.2 mmol/L   Chloride 99 96 - 106 mmol/L   CO2 25 20 - 29 mmol/L   Calcium 10.0 8.7 - 10.3 mg/dL   Total Protein 7.8 6.0 - 8.5 g/dL   Albumin 4.3 3.8 - 4.8 g/dL   Globulin, Total 3.5 1.5 - 4.5 g/dL   Bilirubin Total 0.2 0.0 - 1.2 mg/dL   Alkaline Phosphatase 138 (H) 44 - 121 IU/L   AST 20 0 - 40 IU/L   ALT 10 0 - 32 IU/L  CBC with Differential/Platelet  Result Value Ref Range   WBC 3.8 3.4 - 10.8 x10E3/uL   RBC 4.52 3.77 - 5.28 x10E6/uL   Hemoglobin 13.2 11.1 - 15.9 g/dL   Hematocrit 69.6 29.5 - 46.6 %   MCV 91 79 - 97 fL   MCH 29.2 26.6 - 33.0 pg   MCHC 32.0 31.5 - 35.7 g/dL  RDW 12.3 11.7 - 15.4 %   Platelets  511 (H) 150 - 450 x10E3/uL   Neutrophils 41 Not Estab. %   Lymphs 43 Not Estab. %   Monocytes 13 Not Estab. %   Eos 2 Not Estab. %   Basos 1 Not Estab. %   Neutrophils Absolute 1.5 1.4 - 7.0 x10E3/uL   Lymphocytes Absolute 1.6 0.7 - 3.1 x10E3/uL   Monocytes Absolute 0.5 0.1 - 0.9 x10E3/uL   EOS (ABSOLUTE) 0.1 0.0 - 0.4 x10E3/uL   Basophils Absolute 0.0 0.0 - 0.2 x10E3/uL   Immature Granulocytes 0 Not Estab. %   Immature Grans (Abs) 0.0 0.0 - 0.1 x10E3/uL  Lipid Panel w/o Chol/HDL Ratio  Result Value Ref Range   Cholesterol, Total 175 100 - 199 mg/dL   Triglycerides 098 0 - 149 mg/dL   HDL 51 >11 mg/dL   VLDL Cholesterol Cal 22 5 - 40 mg/dL   LDL Chol Calc (NIH) 914 (H) 0 - 99 mg/dL  TSH  Result Value Ref Range   TSH 7.290 (H) 0.450 - 4.500 uIU/mL  Microalbumin, Urine Waived  Result Value Ref Range   Microalb, Ur Waived 30 (H) 0 - 19 mg/L   Creatinine, Urine Waived 50 10 - 300 mg/dL   Microalb/Creat Ratio 30-300 (H) <30 mg/g      Assessment & Plan:   Problem List Items Addressed This Visit       Other   Acute pain of left wrist - Primary    Acute for 3-4 days with overall reassuring exam.  Suspect some OA present to area and has flared with increased activity at home.  Recommend she rest wrist for 4-5 days.  Continue at home regimen for pain which is offering benefit.  May apply ice as needed.  Continue hand/wrist sleeve.  She declines labs today to check uric acid.  To return if ongoing or worsening.        Follow up plan: Return if symptoms worsen or fail to improve.

## 2022-12-29 NOTE — Assessment & Plan Note (Signed)
Acute for 3-4 days with overall reassuring exam.  Suspect some OA present to area and has flared with increased activity at home.  Recommend she rest wrist for 4-5 days.  Continue at home regimen for pain which is offering benefit.  May apply ice as needed.  Continue hand/wrist sleeve.  She declines labs today to check uric acid.  To return if ongoing or worsening.

## 2023-01-18 ENCOUNTER — Encounter: Payer: Self-pay | Admitting: Nurse Practitioner

## 2023-01-18 ENCOUNTER — Ambulatory Visit (INDEPENDENT_AMBULATORY_CARE_PROVIDER_SITE_OTHER): Payer: Medicare Other | Admitting: Nurse Practitioner

## 2023-01-18 VITALS — BP 136/80 | HR 96 | Temp 97.5°F | Ht 62.0 in | Wt 160.4 lb

## 2023-01-18 DIAGNOSIS — G8929 Other chronic pain: Secondary | ICD-10-CM | POA: Diagnosis not present

## 2023-01-18 DIAGNOSIS — M79642 Pain in left hand: Secondary | ICD-10-CM

## 2023-01-18 NOTE — Assessment & Plan Note (Signed)
Ongoing for months. No injuries.  Suspect this is more OA related.  Will obtain imaging to further assess hand.  Recommend she continue current home regimen: arthritis glove, creams, and Tylenol as needed.  Ensure stretching of hands daily, especially when first gets up in morning.  Will determine next steps after imaging returns.

## 2023-01-18 NOTE — Progress Notes (Signed)
BP 136/80 (BP Location: Left Arm, Patient Position: Sitting, Cuff Size: Normal)   Pulse 96   Temp (!) 97.5 F (36.4 C) (Oral)   Ht 5\' 2"  (1.575 m)   Wt 160 lb 6.4 oz (72.8 kg)   LMP  (LMP Unknown)   SpO2 97%   BMI 29.34 kg/m    Subjective:    Patient ID: Carol [name], female    DOB: May 21, 1950, 72 y.o.   MRN: [id number]  HPI: Carol [name] is a 72 y.o. female  Chief Complaint  Patient presents with   Hand Pain    Patient states she is still having pain in her hand, requesting to have xray ordered    HAND PAIN Left hand pain ongoing for several months to inner aspect near thumb.  No recent injuries.  Worse when trying to lift items.  Right hand dominant. Would like imaging to further assess. Duration: weeks Involved hand: left - right aches too, but not as bad Mechanism of injury: unknown Location: dorsal and medial Onset: sudden Severity: 5/10  Quality: dull, aching, and throbbing Frequency: intermittent Radiation: no Aggravating factors: cold air and lifting Alleviating factors: arthritis glove and Red hot arthritis Treatments attempted: Meloxicam takes a bed time, Tylenol Relief with NSAIDs?: mild Weakness: no Numbness: no Redness: no Swelling: occasional Bruising: no Fevers: no   Relevant past medical, surgical, family and social history reviewed and updated as indicated. Interim medical history since our last visit reviewed. Allergies and medications reviewed and updated.  Review of Systems  Constitutional:  Negative for activity change, appetite change, diaphoresis, fatigue and fever.  Respiratory:  Negative for cough, chest tightness and shortness of breath.   Cardiovascular:  Negative for chest pain, palpitations and leg swelling.  Endocrine: Negative for cold intolerance.  Musculoskeletal:  Positive for arthralgias.  Neurological: Negative.   Psychiatric/Behavioral: Negative.      Per HPI unless specifically indicated above     Objective:     BP 136/80 (BP Location: Left Arm, Patient Position: Sitting, Cuff Size: Normal)   Pulse 96   Temp (!) 97.5 F (36.4 C) (Oral)   Ht 5\' 2"  (1.575 m)   Wt 160 lb 6.4 oz (72.8 kg)   LMP  (LMP Unknown)   SpO2 97%   BMI 29.34 kg/m   Wt Readings from Last 3 Encounters:  01/18/23 160 lb 6.4 oz (72.8 kg)  12/29/22 160 lb 6.4 oz (72.8 kg)  11/30/22 158 lb (71.7 kg)    Physical Exam Vitals and nursing note reviewed.  Constitutional:      General: She is awake. She is not in acute distress.    Appearance: She is well-developed and well-groomed. She is not ill-appearing or toxic-appearing.  HENT:     Head: Normocephalic.     Right Ear: Hearing and external ear normal.     Left Ear: Hearing and external ear normal.  Eyes:     General: Lids are normal.        Right eye: No discharge.        Left eye: No discharge.     Conjunctiva/sclera: Conjunctivae normal.     Pupils: Pupils are equal, round, and reactive to light.  Neck:     Thyroid: No thyromegaly.     Vascular: No carotid bruit.  Cardiovascular:     Rate and Rhythm: Normal rate and regular rhythm.     Heart sounds: Normal heart sounds. No murmur heard.    No gallop.  Pulmonary:  Effort: Pulmonary effort is normal. No accessory muscle usage or respiratory distress.     Breath sounds: Normal breath sounds.  Abdominal:     General: Bowel sounds are normal. There is no distension.     Palpations: Abdomen is soft.     Tenderness: There is no abdominal tenderness.  Musculoskeletal:     Right hand: No swelling, tenderness or bony tenderness. Normal range of motion. Normal strength. Normal sensation. Normal capillary refill. Normal pulse.     Left hand: Swelling (mild to medial dorsal aspect) and tenderness (to medial dorsal aspect along lower thumb) present. No bony tenderness. Normal range of motion. Normal strength. Normal sensation. Normal capillary refill. Normal pulse.     Cervical back: Normal range of motion and neck supple.      Right lower leg: No edema.     Left lower leg: No edema.  Lymphadenopathy:     Cervical: No cervical adenopathy.  Skin:    General: Skin is warm and dry.  Neurological:     Mental Status: She is alert and oriented to person, place, and time.     Deep Tendon Reflexes: Reflexes are normal and symmetric.     Reflex Scores:      Brachioradialis reflexes are 2+ on the right side and 2+ on the left side.      Patellar reflexes are 2+ on the right side and 2+ on the left side. Psychiatric:        Attention and Perception: Attention normal.        Mood and Affect: Mood normal.        Speech: Speech normal.        Behavior: Behavior normal. Behavior is cooperative.        Thought Content: Thought content normal.     Results for orders placed or performed in visit on 11/30/22  HgB A1c  Result Value Ref Range   Hgb A1c MFr Bld 6.0 (H) 4.8 - 5.6 %   Est. average glucose Bld gHb Est-mCnc 126 mg/dL  T4, free  Result Value Ref Range   Free T4 0.93 0.82 - 1.77 ng/dL  Comprehensive metabolic panel  Result Value Ref Range   Glucose 88 70 - 99 mg/dL   BUN 10 8 - 27 mg/dL   Creatinine, Ser 1.61 0.57 - 1.00 mg/dL   eGFR 75 >09 UE/AVW/0.98   BUN/Creatinine Ratio 12 12 - 28   Sodium 139 134 - 144 mmol/L   Potassium 4.0 3.5 - 5.2 mmol/L   Chloride 99 96 - 106 mmol/L   CO2 25 20 - 29 mmol/L   Calcium 10.0 8.7 - 10.3 mg/dL   Total Protein 7.8 6.0 - 8.5 g/dL   Albumin 4.3 3.8 - 4.8 g/dL   Globulin, Total 3.5 1.5 - 4.5 g/dL   Bilirubin Total 0.2 0.0 - 1.2 mg/dL   Alkaline Phosphatase 138 (H) 44 - 121 IU/L   AST 20 0 - 40 IU/L   ALT 10 0 - 32 IU/L  CBC with Differential/Platelet  Result Value Ref Range   WBC 3.8 3.4 - 10.8 x10E3/uL   RBC 4.52 3.77 - 5.28 x10E6/uL   Hemoglobin 13.2 11.1 - 15.9 g/dL   Hematocrit 11.9 14.7 - 46.6 %   MCV 91 79 - 97 fL   MCH 29.2 26.6 - 33.0 pg   MCHC 32.0 31.5 - 35.7 g/dL   RDW 82.9 56.2 - 13.0 %   Platelets 511 (H) 150 - 450 x10E3/uL  Neutrophils 41  Not Estab. %   Lymphs 43 Not Estab. %   Monocytes 13 Not Estab. %   Eos 2 Not Estab. %   Basos 1 Not Estab. %   Neutrophils Absolute 1.5 1.4 - 7.0 x10E3/uL   Lymphocytes Absolute 1.6 0.7 - 3.1 x10E3/uL   Monocytes Absolute 0.5 0.1 - 0.9 x10E3/uL   EOS (ABSOLUTE) 0.1 0.0 - 0.4 x10E3/uL   Basophils Absolute 0.0 0.0 - 0.2 x10E3/uL   Immature Granulocytes 0 Not Estab. %   Immature Grans (Abs) 0.0 0.0 - 0.1 x10E3/uL  Lipid Panel w/o Chol/HDL Ratio  Result Value Ref Range   Cholesterol, Total 175 100 - 199 mg/dL   Triglycerides 098 0 - 149 mg/dL   HDL 51 >11 mg/dL   VLDL Cholesterol Cal 22 5 - 40 mg/dL   LDL Chol Calc (NIH) 914 (H) 0 - 99 mg/dL  TSH  Result Value Ref Range   TSH 7.290 (H) 0.450 - 4.500 uIU/mL  Microalbumin, Urine Waived  Result Value Ref Range   Microalb, Ur Waived 30 (H) 0 - 19 mg/L   Creatinine, Urine Waived 50 10 - 300 mg/dL   Microalb/Creat Ratio 30-300 (H) <30 mg/g      Assessment & Plan:   Problem List Items Addressed This Visit       Other   Chronic hand pain, left - Primary    Ongoing for months. No injuries.  Suspect this is more OA related.  Will obtain imaging to further assess hand.  Recommend she continue current home regimen: arthritis glove, creams, and Tylenol as needed.  Ensure stretching of hands daily, especially when first gets up in morning.  Will determine next steps after imaging returns.      Relevant Orders   DG Hand Complete Left     Follow up plan: Return for as scheduled in future.

## 2023-01-18 NOTE — Patient Instructions (Signed)
Hand Pain Hand pain can make it hard to do daily activities. Many things can cause hand pain. Some common causes are: Injuries. These may include: Broken bones (fractures)and cuts. Overuse injuries from doing the same movements many times (repetitive activity). Arthritis. Lumps in the tendons or joints of the hand and wrist (ganglion cysts). Nerve compression syndromes (carpal tunnel syndrome). Inflammation of the tendons (tendinitis). Infection. Follow these instructions at home: Managing pain, stiffness, and swelling     Take over-the-counter and prescription medicines only as told by your health care provider. If told, put ice on the affected area. Put ice in a plastic bag. Place a towel between your skin and the bag. Leave the ice on for 20 minutes, 2-3 times a day. If told, apply heat to the affected area before you exercise or as often as told by your provider. Use the heat source that your provider recommends, such as a moist heat pack or a heating pad. Place a towel between your skin and the heat source. Leave the heat on for 20-30 minutes. If your skin turns bright red, remove the ice or heat right away to prevent skin damage. The risk of damage is higher if you cannot feel pain, heat, or cold. Activity Take breaks from repetitive activity often. Minimize stress on your hands and wrists as much as possible. Do stretches or exercises as told by your provider. Do not do activities that make your pain worse. Wear a hand splint or support as told by your provider. Contact a health care provider if: Your pain does not get better after a few days. Your pain gets worse. Your pain affects your ability to do your daily activities. Your hand becomes warm, red, or swollen. Your hand is numb or tingling. Get help right away if: Your hand is extremely swollen or is an unusual shape. Your hand or fingers turn white or blue. You cannot move your hand, wrist, or fingers. This  information is not intended to replace advice given to you by your health care provider. Make sure you discuss any questions you have with your health care provider. Document Revised: 09/09/2021 Document Reviewed: 09/09/2021 Elsevier Patient Education  2024 [employer]  

## 2023-01-19 ENCOUNTER — Ambulatory Visit
Admission: RE | Admit: 2023-01-19 | Discharge: 2023-01-19 | Disposition: A | Discharge status: Home / Self Care | Payer: Medicare Other | Source: Ambulatory Visit | Attending: Nurse Practitioner | Admitting: Nurse Practitioner

## 2023-01-19 ENCOUNTER — Ambulatory Visit
Admission: RE | Admit: 2023-01-19 | Discharge: 2023-01-19 | Disposition: A | Discharge status: Home / Self Care | Payer: Medicare Other | Source: Home / Self Care | Attending: Nurse Practitioner | Admitting: Nurse Practitioner

## 2023-01-19 DIAGNOSIS — M79642 Pain in left hand: Secondary | ICD-10-CM | POA: Insufficient documentation

## 2023-01-19 DIAGNOSIS — G8929 Other chronic pain: Secondary | ICD-10-CM | POA: Insufficient documentation

## 2023-01-19 DIAGNOSIS — M1812 Unilateral primary osteoarthritis of first carpometacarpal joint, left hand: Secondary | ICD-10-CM | POA: Diagnosis not present

## 2023-01-19 NOTE — Progress Notes (Signed)
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  If pain worsens or continues let me know and we can get you into physical or have you see orthopedics.  Any questions?

## 2023-01-26 ENCOUNTER — Other Ambulatory Visit: Payer: Self-pay | Admitting: Nurse Practitioner

## 2023-01-26 DIAGNOSIS — Z1231 Encounter for screening mammogram for malignant neoplasm of breast: Secondary | ICD-10-CM

## 2023-02-17 DIAGNOSIS — M1711 Unilateral primary osteoarthritis, right knee: Secondary | ICD-10-CM | POA: Diagnosis not present

## 2023-02-17 DIAGNOSIS — Z96643 Presence of artificial hip joint, bilateral: Secondary | ICD-10-CM | POA: Diagnosis not present

## 2023-02-26 NOTE — Patient Instructions (Addendum)
 [facility] has a liquid multivitamin -- is at [employer] [facility] [employer].  Hypothyroidism  Hypothyroidism is when the thyroid  gland does not make enough of certain hormones. This is called an underactive thyroid . The thyroid  gland is a small gland located in the lower front part of the neck, just in front of the windpipe (trachea). This gland makes hormones that help control how the body uses food for energy (metabolism) as well as how the heart and brain function. These hormones also play a role in keeping your bones strong. When the thyroid  is underactive, it produces too little of the hormones thyroxine (T4) and triiodothyronine (T3). What are the causes? This condition may be caused by: Hashimoto's disease. This is a disease in which the body's disease-fighting system (immune system) attacks the thyroid  gland. This is the most common cause. Viral infections. Pregnancy. Certain medicines. Birth defects. Problems with a gland in the center of the brain (pituitary gland). Lack of enough iodine in the diet. Other causes may include: Past radiation treatments to the head or neck for cancer. Past treatment with radioactive iodine. Past exposure to radiation in the environment. Past surgical removal of part or all of the thyroid . What increases the risk? You are more likely to develop this condition if: You are female. You have a family history of thyroid  conditions. You use a medicine called lithium. You take medicines that affect the immune system (immunosuppressants). What are the signs or symptoms? Common symptoms of this condition include: Not being able to tolerate cold. Feeling as though you have no energy (lethargy). Lack of appetite. Constipation. Sadness or depression. Weight gain that is not explained by a change in diet or exercise habits. Menstrual irregularity. Dry skin, coarse hair, or brittle nails. Other symptoms may include: Muscle pain. Slowing of thought processes. Poor  memory. How is this diagnosed? This condition may be diagnosed based on: Your symptoms, your medical history, and a physical exam. Blood tests. You may also have imaging tests, such as an ultrasound or MRI. How is this treated? This condition is treated with medicine that replaces the thyroid  hormones that your body does not make. After you begin treatment, it may take several weeks for symptoms to go away. Follow these instructions at home: Take over-the-counter and prescription medicines only as told by your health care provider. If you start taking any new medicines, tell your health care provider. Keep all follow-up visits as told by your health care provider. This is important. As your condition improves, your dosage of thyroid  hormone medicine may change. You will need to have blood tests regularly so that your health care provider can monitor your condition. Contact a health care provider if: Your symptoms do not get better with treatment. You are taking thyroid  hormone replacement medicine and you: Sweat a lot. Have tremors. Feel anxious. Lose weight rapidly. Cannot tolerate heat. Have emotional swings. Have diarrhea. Feel weak. Get help right away if: You have chest pain. You have an irregular heartbeat. You have a rapid heartbeat. You have difficulty breathing. These symptoms may be an emergency. Get help right away. Call 911. Do not wait to see if the symptoms will go away. Do not drive yourself to the hospital. Summary Hypothyroidism is when the thyroid  gland does not make enough of certain hormones (it is underactive). When the thyroid  is underactive, it produces too little of the hormones thyroxine (T4) and triiodothyronine (T3). The most common cause is Hashimoto's disease, a disease in which the body's disease-fighting system (  immune system) attacks the thyroid  gland. The condition can also be caused by viral infections, medicine, pregnancy, or past radiation  treatment to the head or neck. Symptoms may include weight gain, dry skin, constipation, feeling as though you do not have energy, and not being able to tolerate cold. This condition is treated with medicine to replace the thyroid  hormones that your body does not make. This information is not intended to replace advice given to you by your health care provider. Make sure you discuss any questions you have with your health care provider. Document Revised: 02/03/2021 Document Reviewed: 02/03/2021 Elsevier Patient Education  2024 [employer].

## 2023-02-28 ENCOUNTER — Ambulatory Visit
Admission: RE | Admit: 2023-02-28 | Discharge: 2023-02-28 | Disposition: A | Discharge status: Home / Self Care | Payer: Medicare Other | Source: Ambulatory Visit | Attending: Nurse Practitioner | Admitting: Nurse Practitioner

## 2023-02-28 ENCOUNTER — Other Ambulatory Visit: Payer: Self-pay | Admitting: Nurse Practitioner

## 2023-02-28 DIAGNOSIS — Z1231 Encounter for screening mammogram for malignant neoplasm of breast: Secondary | ICD-10-CM | POA: Diagnosis not present

## 2023-03-01 NOTE — Telephone Encounter (Signed)
 Requested Prescriptions  Pending Prescriptions Disp Refills   fluticasone  (FLONASE ) 50 MCG/ACT nasal spray [Pharmacy Med Name: FLUTICASONE  PROPIONATE 50 MCG/ACT N] 16 g 0    Sig: USE 2 SPRAYS INTO EACH NOSTRIL ONCE DAILY     Ear, Nose, and Throat: Nasal Preparations - Corticosteroids Passed - 03/01/2023  2:51 PM      Passed - Valid encounter within last 12 months    Recent Outpatient Visits           1 month ago Chronic hand pain, left   [place] [facility] [place], Melanie T, NP   2 months ago Acute pain of left wrist   [place] [facility] [place], Melanie T, NP   3 months ago Primary hypertension   [place] [facility] [place], [place] T, NP   6 months ago Hashimoto's thyroiditis   [place] [facility] [place], [place] T, NP   9 months ago Primary hypertension   [place] [facility] [place], Melanie DASEN, NP       Future Appointments             Tomorrow [place], Jolene T, NP [place] [facility]

## 2023-03-02 ENCOUNTER — Ambulatory Visit (INDEPENDENT_AMBULATORY_CARE_PROVIDER_SITE_OTHER): Payer: Medicare Other | Admitting: Nurse Practitioner

## 2023-03-02 ENCOUNTER — Encounter: Payer: Self-pay | Admitting: Nurse Practitioner

## 2023-03-02 VITALS — BP 132/78 | HR 89 | Temp 97.3°F | Ht 62.0 in | Wt 159.6 lb

## 2023-03-02 DIAGNOSIS — E063 Autoimmune thyroiditis: Secondary | ICD-10-CM

## 2023-03-02 DIAGNOSIS — E782 Mixed hyperlipidemia: Secondary | ICD-10-CM

## 2023-03-02 DIAGNOSIS — R801 Persistent proteinuria, unspecified: Secondary | ICD-10-CM

## 2023-03-02 DIAGNOSIS — I10 Essential (primary) hypertension: Secondary | ICD-10-CM

## 2023-03-02 DIAGNOSIS — D649 Anemia, unspecified: Secondary | ICD-10-CM

## 2023-03-02 DIAGNOSIS — Z1211 Encounter for screening for malignant neoplasm of colon: Secondary | ICD-10-CM

## 2023-03-02 LAB — MICROALBUMIN, URINE WAIVED
Creatinine, Urine Waived: 200 mg/dL (ref 10–300)
Microalb, Ur Waived: 80 mg/L — ABNORMAL HIGH (ref 0–19)

## 2023-03-02 NOTE — Assessment & Plan Note (Signed)
Chronic, stable.  BP initial elevated with machine, she gets very nervous with machine and does not like pain, on recheck manually BP came down to baseline.  Recommend she monitor at least a few times a week at home.  Continue Losartan 25 MG daily and adjust as needed -- she did not tolerate increase in this dose to 50 MG in past and had dizziness.  Focus on DASH diet at home.  Labs today: CBC, CMP, TSH, urine ALB. Return to office in 3 months for BP check and reassurance.

## 2023-03-02 NOTE — Progress Notes (Signed)
 BP 132/78 (BP Location: Left Arm, Patient Position: Sitting)   Pulse 89   Temp (!) 97.3 F (36.3 C) (Oral)   Ht 5\' 2"  (1.575 m)   Wt 159 lb 9.6 oz (72.4 kg)   LMP  (LMP Unknown)   SpO2 98%   BMI 29.19 kg/m    Subjective:    Patient ID: Carol [name], female    DOB: July 23, 1950, 73 y.o.   MRN: [id number]  HPI: Carol [name] is a 73 y.o. female  Chief Complaint  Patient presents with   Hypertension   Hyperlipidemia   Hypothyroidism   HYPERTENSION / HYPERLIPIDEMIA Continues Losartan  25 MG (for HTN and proteinuria) and Atorvastatin .  A1c in October was 6%. Remaining in prediabetic range.  She focuses on diet at home.   Has knee replacement scheduled for February 10th, Dr. Aubry Blase. Satisfied with current treatment? yes Duration of hypertension: chronic BP monitoring frequency: not checking BP range: BP medication side effects: no Duration of hyperlipidemia: chronic Cholesterol medication side effects: no Cholesterol supplements: none Medication compliance: good compliance Aspirin : yes Recent stressors: no Recurrent headaches: no Visual changes: no Palpitations: no Dyspnea: no Chest pain: no Lower extremity edema: no Dizzy/lightheaded: no  HASHIMOTO'S THYROID  In past she took a low dose Levothyroxine  and reported side effects with this.  Thyroid  ultrasound 10/30/19 -- multinodular goiter noted.  ENT saw patient and biopsy was negative.  In past she as refused to restart medicine, but agrees to do so today if levels still elevated on this check, as long as small pill. Cold intolerance: no Heat intolerance: no Weight gain: no Weight loss: no Constipation: no Diarrhea/loose stools: no Palpitations: no Lower extremity edema: no Anxiety/depressed mood: no      03/02/2023    9:49 AM 11/30/2022    8:56 AM 09/07/2022   11:03 AM 02/09/2022   12:02 PM 11/09/2021    9:28 AM  Depression screen PHQ 2/9  Decreased Interest 0 0 0 0 0  Down, Depressed, Hopeless 0 0 0 0 0   PHQ - 2 Score 0 0 0 0 0  Altered sleeping 0 0 0 0 0  Tired, decreased energy 0 0 0 0 0  Change in appetite 0 0 0 0 0  Feeling bad or failure about yourself  0 0 0 0 0  Trouble concentrating 0 0 0 0 0  Moving slowly or fidgety/restless 0 0 0 0 0  Suicidal thoughts 0 0 0 0 0  PHQ-9 Score 0 0 0 0 0  Difficult doing work/chores Not difficult at all Not difficult at all Not difficult at all Not difficult at all Not difficult at all       03/02/2023    9:49 AM 11/30/2022    8:56 AM 02/09/2022   12:02 PM 01/29/2022    4:23 PM  GAD 7 : Generalized Anxiety Score  Nervous, Anxious, on Edge 0 0 0 --  Control/stop worrying 0 0 0   Worry too much - different things 0 0 0   Trouble relaxing 0 0 0   Restless 0 0 0   Easily annoyed or irritable 0 0 0   Afraid - awful might happen 0 0 0   Total GAD 7 Score 0 0 0   Anxiety Difficulty Not difficult at all Not difficult at all Not difficult at all     Relevant past medical, surgical, family and social history reviewed and updated as indicated. Interim medical history since our last visit  reviewed. Allergies and medications reviewed and updated.  Review of Systems  Constitutional:  Negative for activity change, appetite change, diaphoresis, fatigue and fever.  Respiratory:  Negative for cough, chest tightness and shortness of breath.   Cardiovascular:  Negative for chest pain, palpitations and leg swelling.  Gastrointestinal: Negative.   Neurological: Negative.   Psychiatric/Behavioral: Negative.     Per HPI unless specifically indicated above     Objective:    BP 132/78 (BP Location: Left Arm, Patient Position: Sitting)   Pulse 89   Temp (!) 97.3 F (36.3 C) (Oral)   Ht 5\' 2"  (1.575 m)   Wt 159 lb 9.6 oz (72.4 kg)   LMP  (LMP Unknown)   SpO2 98%   BMI 29.19 kg/m   Wt Readings from Last 3 Encounters:  03/02/23 159 lb 9.6 oz (72.4 kg)  01/18/23 160 lb 6.4 oz (72.8 kg)  12/29/22 160 lb 6.4 oz (72.8 kg)    Physical Exam Vitals  and nursing note reviewed.  Constitutional:      General: She is awake. She is not in acute distress.    Appearance: She is well-developed and overweight. She is not ill-appearing.  HENT:     Head: Normocephalic.     Right Ear: Hearing, tympanic membrane, ear canal and external ear normal.     Left Ear: Hearing, tympanic membrane, ear canal and external ear normal.  Eyes:     General: Lids are normal.        Right eye: No discharge.        Left eye: No discharge.     Conjunctiva/sclera: Conjunctivae normal.     Pupils: Pupils are equal, round, and reactive to light.  Neck:     Thyroid : Thyromegaly present.     Vascular: No carotid bruit.  Cardiovascular:     Rate and Rhythm: Normal rate and regular rhythm.     Heart sounds: Normal heart sounds. No murmur heard.    No gallop.  Pulmonary:     Effort: Pulmonary effort is normal. No accessory muscle usage or respiratory distress.     Breath sounds: Normal breath sounds.  Abdominal:     General: Bowel sounds are normal.     Palpations: Abdomen is soft.  Musculoskeletal:     Cervical back: Normal range of motion and neck supple.     Right lower leg: No edema.     Left lower leg: No edema.  Lymphadenopathy:     Head:     Right side of head: No submental, submandibular, tonsillar, preauricular or posterior auricular adenopathy.     Left side of head: No submental, submandibular, tonsillar, preauricular or posterior auricular adenopathy.     Cervical: No cervical adenopathy.  Skin:    General: Skin is warm and dry.  Neurological:     Mental Status: She is alert and oriented to person, place, and time.  Psychiatric:        Attention and Perception: Attention normal.        Mood and Affect: Mood normal.        Speech: Speech normal.        Behavior: Behavior normal. Behavior is cooperative.        Thought Content: Thought content normal.    Results for orders placed or performed in visit on 11/30/22  Microalbumin, Urine Waived    Collection Time: 11/30/22  9:26 AM  Result Value Ref Range   Microalb, Ur Waived 30 (H) 0 - 19  mg/L   Creatinine, Urine Waived 50 10 - 300 mg/dL   Microalb/Creat Ratio 30-300 (H) <30 mg/g  HgB A1c   Collection Time: 11/30/22  9:31 AM  Result Value Ref Range   Hgb A1c MFr Bld 6.0 (H) 4.8 - 5.6 %   Est. average glucose Bld gHb Est-mCnc 126 mg/dL  T4, free   Collection Time: 11/30/22  9:31 AM  Result Value Ref Range   Free T4 0.93 0.82 - 1.77 ng/dL  Comprehensive metabolic panel   Collection Time: 11/30/22  9:31 AM  Result Value Ref Range   Glucose 88 70 - 99 mg/dL   BUN 10 8 - 27 mg/dL   Creatinine, Ser 4.09 0.57 - 1.00 mg/dL   eGFR 75 >81 XB/JYN/8.29   BUN/Creatinine Ratio 12 12 - 28   Sodium 139 134 - 144 mmol/L   Potassium 4.0 3.5 - 5.2 mmol/L   Chloride 99 96 - 106 mmol/L   CO2 25 20 - 29 mmol/L   Calcium  10.0 8.7 - 10.3 mg/dL   Total Protein 7.8 6.0 - 8.5 g/dL   Albumin 4.3 3.8 - 4.8 g/dL   Globulin, Total 3.5 1.5 - 4.5 g/dL   Bilirubin Total 0.2 0.0 - 1.2 mg/dL   Alkaline Phosphatase 138 (H) 44 - 121 IU/L   AST 20 0 - 40 IU/L   ALT 10 0 - 32 IU/L  CBC with Differential/Platelet   Collection Time: 11/30/22  9:31 AM  Result Value Ref Range   WBC 3.8 3.4 - 10.8 x10E3/uL   RBC 4.52 3.77 - 5.28 x10E6/uL   Hemoglobin 13.2 11.1 - 15.9 g/dL   Hematocrit 56.2 13.0 - 46.6 %   MCV 91 79 - 97 fL   MCH 29.2 26.6 - 33.0 pg   MCHC 32.0 31.5 - 35.7 g/dL   RDW 86.5 78.4 - 69.6 %   Platelets 511 (H) 150 - 450 x10E3/uL   Neutrophils 41 Not Estab. %   Lymphs 43 Not Estab. %   Monocytes 13 Not Estab. %   Eos 2 Not Estab. %   Basos 1 Not Estab. %   Neutrophils Absolute 1.5 1.4 - 7.0 x10E3/uL   Lymphocytes Absolute 1.6 0.7 - 3.1 x10E3/uL   Monocytes Absolute 0.5 0.1 - 0.9 x10E3/uL   EOS (ABSOLUTE) 0.1 0.0 - 0.4 x10E3/uL   Basophils Absolute 0.0 0.0 - 0.2 x10E3/uL   Immature Granulocytes 0 Not Estab. %   Immature Grans (Abs) 0.0 0.0 - 0.1 x10E3/uL  Lipid Panel w/o Chol/HDL  Ratio   Collection Time: 11/30/22  9:31 AM  Result Value Ref Range   Cholesterol, Total 175 100 - 199 mg/dL   Triglycerides 295 0 - 149 mg/dL   HDL 51 >28 mg/dL   VLDL Cholesterol Cal 22 5 - 40 mg/dL   LDL Chol Calc (NIH) 413 (H) 0 - 99 mg/dL  TSH   Collection Time: 11/30/22  9:31 AM  Result Value Ref Range   TSH 7.290 (H) 0.450 - 4.500 uIU/mL      Assessment & Plan:   Problem List Items Addressed This Visit       Cardiovascular and Mediastinum   Hypertension - Primary   Chronic, stable.  BP initial elevated with machine, she gets very nervous with machine and does not like pain, on recheck manually BP came down to baseline.  Recommend she monitor at least a few times a week at home.  Continue Losartan  25 MG daily and adjust as needed -- she  did not tolerate increase in this dose to 50 MG in past and had dizziness.  Focus on DASH diet at home.  Labs today: CBC, CMP, TSH, urine ALB. Return to office in 3 months for BP check and reassurance.        Relevant Orders   CBC with Differential/Platelet   Comprehensive metabolic panel   Microalbumin, Urine Waived     Endocrine   Hashimoto's thyroiditis   Ongoing.  She did not tolerate low dose Levothyroxine  in past, but discussed with her may benefit retrial of this in future due to levels on labs and goiter.  She has refused this in past, but agrees to this plan today as long as pill is small.  Educated her on Hashimoto's thyroiditis and effect on thyroid  over time.  Recheck TSH and Free T4. Denies any current symptoms.  Normal biopsy in past, but multinodular goiter noted on u/s.      Relevant Orders   T4, free   TSH     Other   Hyperlipidemia   Chronic, ongoing.  Continues every other day statin which has decreased side effects.  Lipid panel today.      Relevant Orders   Comprehensive metabolic panel   Lipid Panel w/o Chol/HDL Ratio   Proteinuria   Ongoing with stable kidney blood work.  Continue Losartan  for kidney  protection, urine ALB 15 May 2022, improved from previous.  Check CMP and urine ALB today.  Consider kidney ultrasound in future, she refuses today.      Relevant Orders   Microalbumin, Urine Waived   Other Visit Diagnoses       Colon cancer screening       Annual FOBT ordered.   Relevant Orders   Fecal occult blood, imunochemical        Follow up plan: Return in about 3 months (around 05/31/2023) for HTN/HLD, THYROID , PREDIABETES.

## 2023-03-02 NOTE — Assessment & Plan Note (Signed)
Ongoing with stable kidney blood work.  Continue Losartan for kidney protection, urine ALB 15 May 2022, improved from previous.  Check CMP and urine ALB today.  Consider kidney ultrasound in future, she refuses today.

## 2023-03-02 NOTE — Assessment & Plan Note (Signed)
 Ongoing.  She did not tolerate low dose Levothyroxine  in past, but discussed with her may benefit retrial of this in future due to levels on labs and goiter.  She has refused this in past, but agrees to this plan today as long as pill is small.  Educated her on Hashimoto's thyroiditis and effect on thyroid  over time.  Recheck TSH and Free T4. Denies any current symptoms.  Normal biopsy in past, but multinodular goiter noted on u/s.

## 2023-03-02 NOTE — Assessment & Plan Note (Signed)
Chronic, ongoing.  Continues every other day statin which has decreased side effects.  Lipid panel today.

## 2023-03-03 ENCOUNTER — Other Ambulatory Visit: Payer: Self-pay | Admitting: Nurse Practitioner

## 2023-03-03 DIAGNOSIS — E063 Autoimmune thyroiditis: Secondary | ICD-10-CM

## 2023-03-03 LAB — COMPREHENSIVE METABOLIC PANEL
ALT: 13 [IU]/L (ref 0–32)
AST: 24 [IU]/L (ref 0–40)
Albumin: 4.2 g/dL (ref 3.8–4.8)
Alkaline Phosphatase: 144 [IU]/L — ABNORMAL HIGH (ref 44–121)
BUN/Creatinine Ratio: 13 (ref 12–28)
BUN: 12 mg/dL (ref 8–27)
Bilirubin Total: 0.4 mg/dL (ref 0.0–1.2)
CO2: 25 mmol/L (ref 20–29)
Calcium: 10.2 mg/dL (ref 8.7–10.3)
Chloride: 100 mmol/L (ref 96–106)
Creatinine, Ser: 0.91 mg/dL (ref 0.57–1.00)
Globulin, Total: 3.3 g/dL (ref 1.5–4.5)
Glucose: 93 mg/dL (ref 70–99)
Potassium: 4.5 mmol/L (ref 3.5–5.2)
Sodium: 138 mmol/L (ref 134–144)
Total Protein: 7.5 g/dL (ref 6.0–8.5)
eGFR: 67 mL/min/{1.73_m2} (ref >59)

## 2023-03-03 LAB — CBC WITH DIFFERENTIAL/PLATELET
Basophils Absolute: 0 10*3/uL (ref 0.0–0.2)
Basos: 1 %
EOS (ABSOLUTE): 0.1 10*3/uL (ref 0.0–0.4)
Eos: 2 %
Hematocrit: 39.6 % (ref 34.0–46.6)
Hemoglobin: 12.9 g/dL (ref 11.1–15.9)
Immature Grans (Abs): 0 10*3/uL (ref 0.0–0.1)
Immature Granulocytes: 0 %
Lymphocytes Absolute: 1.7 10*3/uL (ref 0.7–3.1)
Lymphs: 40 %
MCH: 29.9 pg (ref 26.6–33.0)
MCHC: 32.6 g/dL (ref 31.5–35.7)
MCV: 92 fL (ref 79–97)
Monocytes Absolute: 0.5 10*3/uL (ref 0.1–0.9)
Monocytes: 13 %
Neutrophils Absolute: 1.8 10*3/uL (ref 1.4–7.0)
Neutrophils: 44 %
Platelets: 531 10*3/uL — ABNORMAL HIGH (ref 150–450)
RBC: 4.32 x10E6/uL (ref 3.77–5.28)
RDW: 12.8 % (ref 11.7–15.4)
WBC: 4.1 10*3/uL (ref 3.4–10.8)

## 2023-03-03 LAB — LIPID PANEL W/O CHOL/HDL RATIO
Cholesterol, Total: 156 mg/dL (ref 100–199)
HDL: 52 mg/dL (ref >39)
LDL Chol Calc (NIH): 89 mg/dL (ref 0–99)
Triglycerides: 77 mg/dL (ref 0–149)
VLDL Cholesterol Cal: 15 mg/dL (ref 5–40)

## 2023-03-03 LAB — TSH: TSH: 6.98 u[IU]/mL — ABNORMAL HIGH (ref 0.450–4.500)

## 2023-03-03 LAB — T4, FREE: Free T4: 0.89 ng/dL (ref 0.82–1.77)

## 2023-03-03 MED ORDER — LEVOTHYROXINE SODIUM 25 MCG PO TABS: 25.0000 ug | ORAL_TABLET | Freq: Every day | ORAL | 1 refills | Status: DC

## 2023-03-03 NOTE — Progress Notes (Signed)
Good morning, please let Carol [name] know labs have returned and TSH remains elevated.  I will send in a small dose of Levothyroxine, as we discussed, to start to help reduce thyroid inflammation.  This is a small pill.  Then I would like to recheck labs outpatient in 6 weeks, please schedule.   - Platelets a little elevated still, we will continue to monitor this.  You may benefit from a daily baby aspirin 81 MG to help lower these. - Kidney and liver function normal.  Any questions? Keep being stellar!!  Thank you for allowing me to participate in your care.  I appreciate you. Kindest regards, Marl Seago

## 2023-03-10 LAB — FECAL OCCULT BLOOD, IMMUNOCHEMICAL

## 2023-03-12 NOTE — Discharge Instructions (Signed)
Instructions after Total Knee Replacement   Carol [name] P. Angie Fava., M.D.    Dept. of Orthopaedics & Sports Medicine [facility] [address] [place], [place]  [address]  Phone: [phone]   Fax: [phone]       [email or web address]       DIET: Drink plenty of non-alcoholic fluids. Resume your normal diet. Include foods high in fiber.  ACTIVITY:  You may use crutches or a walker with weight-bearing as tolerated, unless instructed otherwise. You may be weaned off of the walker or crutches by your Physical Therapist.  Do NOT place pillows under the knee. Anything placed under the knee could limit your ability to straighten the knee.   Continue doing gentle exercises. Exercising will reduce the pain and swelling, increase motion, and prevent muscle weakness.   Please continue to use the TED compression stockings for 6 weeks. You may remove the stockings at night, but should reapply them in the morning. Do not drive or operate any equipment until instructed.  WOUND CARE:  Continue to use the PolarCare or ice packs periodically to reduce pain and swelling. You may bathe or shower after the staples are removed at the first office visit following surgery.  MEDICATIONS: You may resume your regular medications. Please take the pain medication as prescribed on the medication. Do not take pain medication on an empty stomach. You have been given a prescription for a blood thinner (Lovenox or Coumadin). Please take the medication as instructed. (NOTE: After completing a 2 week course of Lovenox, take one Enteric-coated aspirin once a day. This along with elevation will help reduce the possibility of phlebitis in your operated leg.) Do not drive or drink alcoholic beverages when taking pain medications.  CALL THE OFFICE FOR: Temperature above 101 degrees Excessive bleeding or drainage on the dressing. Excessive swelling, coldness, or paleness of the toes. Persistent nausea and  vomiting.  FOLLOW-UP:  You should have an appointment to return to the office in 10-14 days after surgery. Arrangements have been made for continuation of Physical Therapy (either home therapy or outpatient therapy).     [facility] Department Directory         [email or web address]       [email or web address]          Cardiology  Appointments: [place] - [phone]  Endocrinology  Appointments: [place] [phone] [place] - [phone]  Gastroenterology  Appointments: [place] [phone] [place] - [phone]        General Surgery   Appointments: [facility]  Internal Medicine/Family Medicine  Appointments: [facility] - [phone] [place] - [phone]  Metabolic and Weigh Loss Surgery  Appointments: [facility]        Neurology  Appointments: [place] [phone] [place] - [phone]  Neurosurgery  Appointments: [place]  Obstetrics & Gynecology  Appointments: [place] [phone] [place] - [phone]        Pediatrics  Appointments: [place] [phone] [place] - [phone]  Physiatry  Appointments: [employer] [phone]  Physical Therapy  Appointments: [place] - [phone]        Podiatry  Appointments: [place] [phone] [place] - [phone]  Pulmonology  Appointments: [place]  Rheumatology  Appointments: [place] [phone]        [place] Location: [facility]  [address] [place], [place]  [address]  [place] Location: [facility] [address] [place], [place]  [address]  [place] Location: [facility] [address]  [address] [place], [place]  [address]

## 2023-03-14 ENCOUNTER — Other Ambulatory Visit: Payer: Self-pay | Admitting: Nurse Practitioner

## 2023-03-14 DIAGNOSIS — D649 Anemia, unspecified: Secondary | ICD-10-CM | POA: Diagnosis not present

## 2023-03-16 ENCOUNTER — Encounter: Payer: Self-pay | Admitting: Nurse Practitioner

## 2023-03-16 LAB — FECAL OCCULT BLOOD, IMMUNOCHEMICAL: Fecal Occult Bld: NEGATIVE

## 2023-03-16 NOTE — Progress Notes (Signed)
Contacted via MyChart   Stool sample is negative for blood, repeat annually until age 73.:)

## 2023-03-21 DIAGNOSIS — M25561 Pain in right knee: Secondary | ICD-10-CM | POA: Diagnosis not present

## 2023-03-21 DIAGNOSIS — M1711 Unilateral primary osteoarthritis, right knee: Secondary | ICD-10-CM | POA: Diagnosis not present

## 2023-03-22 ENCOUNTER — Other Ambulatory Visit: Payer: Self-pay

## 2023-03-22 ENCOUNTER — Encounter
Admission: RE | Admit: 2023-03-22 | Discharge: 2023-03-22 | Disposition: A | Discharge status: Home / Self Care | Payer: Medicare Other | Source: Ambulatory Visit | Attending: Orthopedic Surgery | Admitting: Orthopedic Surgery

## 2023-03-22 VITALS — BP 160/72 | HR 97 | Temp 97.6°F | Resp 18 | Ht 63.0 in | Wt 158.0 lb

## 2023-03-22 DIAGNOSIS — R801 Persistent proteinuria, unspecified: Secondary | ICD-10-CM | POA: Diagnosis not present

## 2023-03-22 DIAGNOSIS — I491 Atrial premature depolarization: Secondary | ICD-10-CM | POA: Insufficient documentation

## 2023-03-22 DIAGNOSIS — R8281 Pyuria: Secondary | ICD-10-CM | POA: Diagnosis not present

## 2023-03-22 DIAGNOSIS — Z01818 Encounter for other preprocedural examination: Secondary | ICD-10-CM | POA: Insufficient documentation

## 2023-03-22 DIAGNOSIS — R7309 Other abnormal glucose: Secondary | ICD-10-CM | POA: Diagnosis not present

## 2023-03-22 DIAGNOSIS — M1711 Unilateral primary osteoarthritis, right knee: Secondary | ICD-10-CM | POA: Insufficient documentation

## 2023-03-22 DIAGNOSIS — R8271 Bacteriuria: Secondary | ICD-10-CM | POA: Diagnosis not present

## 2023-03-22 DIAGNOSIS — R8290 Unspecified abnormal findings in urine: Secondary | ICD-10-CM | POA: Diagnosis not present

## 2023-03-22 DIAGNOSIS — Z01812 Encounter for preprocedural laboratory examination: Secondary | ICD-10-CM

## 2023-03-22 HISTORY — DX: Mixed hyperlipidemia: E78.2

## 2023-03-22 HISTORY — DX: Prediabetes: R73.03

## 2023-03-22 HISTORY — DX: Hypothyroidism, unspecified: E03.9

## 2023-03-22 HISTORY — DX: Persistent proteinuria, unspecified: R80.1

## 2023-03-22 HISTORY — DX: Pain in right knee: M25.561

## 2023-03-22 HISTORY — DX: Anemia, unspecified: D64.9

## 2023-03-22 HISTORY — DX: Autoimmune thyroiditis: E06.3

## 2023-03-22 LAB — URINALYSIS, ROUTINE W REFLEX MICROSCOPIC
Bilirubin Urine: NEGATIVE
Glucose, UA: NEGATIVE mg/dL
Ketones, ur: NEGATIVE mg/dL
Nitrite: NEGATIVE
Protein, ur: NEGATIVE mg/dL
Specific Gravity, Urine: 1.009 (ref 1.005–1.030)
WBC, UA: >50 WBC/hpf (ref 0–5)
pH: 7 (ref 5.0–8.0)

## 2023-03-22 LAB — SURGICAL PCR SCREEN
MRSA, PCR: NEGATIVE
Staphylococcus aureus: NEGATIVE

## 2023-03-22 LAB — CBC
HCT: 39.6 % (ref 36.0–46.0)
Hemoglobin: 13.1 g/dL (ref 12.0–15.0)
MCH: 29.8 pg (ref 26.0–34.0)
MCHC: 33.1 g/dL (ref 30.0–36.0)
MCV: 90.2 fL (ref 80.0–100.0)
Platelets: 538 10*3/uL — ABNORMAL HIGH (ref 150–400)
RBC: 4.39 MIL/uL (ref 3.87–5.11)
RDW: 13.1 % (ref 11.5–15.5)
WBC: 4.6 10*3/uL (ref 4.0–10.5)
nRBC: 0 % (ref 0.0–0.2)

## 2023-03-22 LAB — COMPREHENSIVE METABOLIC PANEL
ALT: 17 U/L (ref 0–44)
AST: 26 U/L (ref 15–41)
Albumin: 4.1 g/dL (ref 3.5–5.0)
Alkaline Phosphatase: 112 U/L (ref 38–126)
Anion gap: 11 (ref 5–15)
BUN: 13 mg/dL (ref 8–23)
CO2: 26 mmol/L (ref 22–32)
Calcium: 9.5 mg/dL (ref 8.9–10.3)
Chloride: 98 mmol/L (ref 98–111)
Creatinine, Ser: 0.75 mg/dL (ref 0.44–1.00)
GFR, Estimated: >60 mL/min (ref >60)
Glucose, Bld: 100 mg/dL — ABNORMAL HIGH (ref 70–99)
Potassium: 4.1 mmol/L (ref 3.5–5.1)
Sodium: 135 mmol/L (ref 135–145)
Total Bilirubin: 0.9 mg/dL (ref 0.0–1.2)
Total Protein: 8.3 g/dL — ABNORMAL HIGH (ref 6.5–8.1)

## 2023-03-22 LAB — C-REACTIVE PROTEIN: CRP: 0.5 mg/dL (ref <1.0)

## 2023-03-22 LAB — HEMOGLOBIN A1C
Hgb A1c MFr Bld: 6 % — ABNORMAL HIGH (ref 4.8–5.6)
Mean Plasma Glucose: 125.5 mg/dL

## 2023-03-22 LAB — SEDIMENTATION RATE: Sed Rate: 33 mm/h — ABNORMAL HIGH (ref 0–30)

## 2023-03-22 NOTE — Patient Instructions (Addendum)
 Your procedure is scheduled on: Monday 03/28/23 Report to the Registration Desk on the 1st floor of the [facility]. To find out your arrival time, please call [phone] between 1PM - 3PM on: Friday 03/25/23  If your arrival time is 6:00 am, do not arrive before that time as the [facility] entrance doors do not open until 6:00 am.  REMEMBER: Instructions that are not followed completely may result in serious medical risk, up to and including death; or upon the discretion of your surgeon and anesthesiologist your surgery may need to be rescheduled.  Do not eat food after midnight the night before surgery.  No gum chewing or hard candies.  You may however, drink CLEAR liquids up to 2 hours before you are scheduled to arrive for your surgery. Do not drink anything within 2 hours of your scheduled arrival time.  Clear liquids include: - water  - apple juice without pulp - gatorade (not RED colors) - black coffee or tea (Do NOT add milk or creamers to the coffee or tea) Do NOT drink anything that is not on this list.   In addition, your doctor has ordered for you to drink the provided:  Ensure Pre-Surgery Clear Carbohydrate Drink  Drinking this carbohydrate drink up to two hours before surgery helps to reduce insulin resistance and improve patient outcomes. Please complete drinking 2 hours before scheduled arrival time.  One week prior to surgery: Stop Anti-inflammatories (NSAIDS) such as Advil, Aleve, Ibuprofen, Motrin, Naproxen, Meloxicam , Naprosyn and Aspirin  based products such as Excedrin, Goody's Powder, BC Powder. Stop ANY OVER THE COUNTER supplements until after surgery. Cyanocobalamin (VITAMIN B-12  Multiple Vitamin (MULTIVITAMIN WITH MINERALS)   You may however, continue to take Tylenol  if needed for pain up until the day of surgery.   **Follow recommendations regarding stopping blood thinners.**  Continue taking all of your other prescription medications up until the  day of surgery.  ON THE DAY OF SURGERY ONLY TAKE THESE MEDICATIONS WITH SIPS OF WATER:  levothyroxine  (SYNTHROID ) 25 MCG  cetirizine (ZYRTEC) 10 MG chewable   Use inhalers on the day of surgery and bring to the hospital. fluticasone  (FLONASE ) 50 MCG/ACT nasal spray   No Alcohol for 24 hours before or after surgery.  No Smoking including e-cigarettes for 24 hours before surgery.  No chewable tobacco products for at least 6 hours before surgery.  No nicotine patches on the day of surgery.  Do not use any recreational drugs for at least a week (preferably 2 weeks) before your surgery.  Please be advised that the combination of cocaine and anesthesia may have negative outcomes, up to and including death. If you test positive for cocaine, your surgery will be cancelled.  On the morning of surgery brush your teeth with toothpaste and water, you may rinse your mouth with mouthwash if you wish. Do not swallow any toothpaste or mouthwash.  Use CHG Soap as directed on instruction sheet.  Do not wear jewelry, make-up, hairpins, clips or nail polish.  For welded (permanent) jewelry: bracelets, anklets, waist bands, etc.  Please have this removed prior to surgery.  If it is not removed, there is a chance that hospital personnel will need to cut it off on the day of surgery.  Do not wear lotions, powders, or perfumes.   Do not shave body hair from the neck down 48 hours before surgery.  Contact lenses, hearing aids and dentures may not be worn into surgery.  Do not bring valuables to  the hospital. [facility] is not responsible for any missing/lost belongings or valuables.   Bring your C-PAP to the hospital in case you may have to spend the night.   Notify your doctor if there is any change in your medical condition (cold, fever, infection).  Wear comfortable clothing (specific to your surgery type) to the hospital.  After surgery, you can help prevent lung complications by doing  breathing exercises.  Take deep breaths and cough every 1-2 hours. Your doctor may order a device called an Incentive Spirometer to help you take deep breaths. When coughing or sneezing, hold a pillow firmly against your incision with both hands. This is called "splinting." Doing this helps protect your incision. It also decreases belly discomfort.  If you are being admitted to the hospital overnight, leave your suitcase in the car. After surgery it may be brought to your room.  In case of increased patient census, it may be necessary for you, the patient, to continue your postoperative care in the Same Day Surgery department.  If you are being discharged the day of surgery, you will not be allowed to drive home. You will need a responsible individual to drive you home and stay with you for 24 hours after surgery.   If you are taking public transportation, you will need to have a responsible individual with you.  Please call the Pre-admissions Testing Dept. at [phone] if you have any questions about these instructions.  Surgery Visitation Policy:  Patients having surgery or a procedure may have two visitors.  Children under the age of 21 must have an adult with them who is not the patient.  Temporary Visitor Restrictions Due to increasing cases of flu, RSV and COVID-19: Children ages 26 and under will not be able to visit patients in [facility] hospitals under most circumstances.  Inpatient Visitation:    Visiting hours are 7 a.m. to 8 p.m. Up to four visitors are allowed at one time in a patient room. The visitors may rotate out with other people during the day.  One visitor age 83 or older may stay with the patient overnight and must be in the room by 8 p.m.    Pre-operative 5 CHG Bath Instructions   You can play a key role in reducing the risk of infection after surgery. Your skin needs to be as free of germs as possible. You can reduce the number of germs on your skin by  washing with CHG (chlorhexidine  gluconate) soap before surgery. CHG is an antiseptic soap that kills germs and continues to kill germs even after washing.   DO NOT use if you have an allergy to chlorhexidine /CHG or antibacterial soaps. If your skin becomes reddened or irritated, stop using the CHG and notify one of our RNs at [phone].   Please shower with the CHG soap starting 4 days before surgery using the following schedule:     Please keep in mind the following:  DO NOT shave, including legs and underarms, starting the day of your first shower.   You may shave your face at any point before/day of surgery.  Place clean sheets on your bed the day you start using CHG soap. Use a clean washcloth (not used since being washed) for each shower. DO NOT sleep with pets once you start using the CHG.   CHG Shower Instructions:  If you choose to wash your hair and private area, wash first with your normal shampoo/soap.  After you use shampoo/soap,  rinse your hair and body thoroughly to remove shampoo/soap residue.  Turn the water OFF and apply about 3 tablespoons (45 ml) of CHG soap to a CLEAN washcloth.  Apply CHG soap ONLY FROM YOUR NECK DOWN TO YOUR TOES (washing for 3-5 minutes)  DO NOT use CHG soap on face, private areas, open wounds, or sores.  Pay special attention to the area where your surgery is being performed.  If you are having back surgery, having someone wash your back for you may be helpful. Wait 2 minutes after CHG soap is applied, then you may rinse off the CHG soap.  Pat dry with a clean towel  Put on clean clothes/pajamas   If you choose to wear lotion, please use ONLY the CHG-compatible lotions on the back of this paper.     Additional instructions for the day of surgery: DO NOT APPLY any lotions, deodorants, cologne, or perfumes.   Put on clean/comfortable clothes.  Brush your teeth.  Ask your nurse before applying any prescription medications to the  skin.      CHG Compatible Lotions   Aveeno Moisturizing lotion  Cetaphil Moisturizing Cream  Cetaphil Moisturizing Lotion  Clairol Herbal Essence Moisturizing Lotion, Dry Skin  Clairol Herbal Essence Moisturizing Lotion, Extra Dry Skin  Clairol Herbal Essence Moisturizing Lotion, Normal Skin  Curel Age Defying Therapeutic Moisturizing Lotion with Alpha Hydroxy  Curel Extreme Care Body Lotion  Curel Soothing Hands Moisturizing Hand Lotion  Curel Therapeutic Moisturizing Cream, Fragrance-Free  Curel Therapeutic Moisturizing Lotion, Fragrance-Free  Curel Therapeutic Moisturizing Lotion, Original Formula  Eucerin Daily Replenishing Lotion  Eucerin Dry Skin Therapy Plus Alpha Hydroxy Crme  Eucerin Dry Skin Therapy Plus Alpha Hydroxy Lotion  Eucerin Original Crme  Eucerin Original Lotion  Eucerin Plus Crme Eucerin Plus Lotion  Eucerin TriLipid Replenishing Lotion  Keri Anti-Bacterial Hand Lotion  Keri Deep Conditioning Original Lotion Dry Skin Formula Softly Scented  Keri Deep Conditioning Original Lotion, Fragrance Free Sensitive Skin Formula  Keri Lotion Fast Absorbing Fragrance Free Sensitive Skin Formula  Keri Lotion Fast Absorbing Softly Scented Dry Skin Formula  Keri Original Lotion  Keri Skin Renewal Lotion Keri Silky Smooth Lotion  Keri Silky Smooth Sensitive Skin Lotion  Nivea Body Creamy Conditioning Oil  Nivea Body Extra Enriched Lotion  Nivea Body Original Lotion  Nivea Body Sheer Moisturizing Lotion Nivea Crme  Nivea Skin Firming Lotion  NutraDerm 30 Skin Lotion  NutraDerm Skin Lotion  NutraDerm Therapeutic Skin Cream  NutraDerm Therapeutic Skin Lotion  ProShield Protective Hand Cream  Provon moisturizing lotion  How to Use an Incentive Spirometer  An incentive spirometer is a tool that measures how well you are filling your lungs with each breath. Learning to take long, deep breaths using this tool can help you keep your lungs clear and active. This may  help to reverse or lessen your chance of developing breathing (pulmonary) problems, especially infection. You may be asked to use a spirometer: After a surgery. If you have a lung problem or a history of smoking. After a long period of time when you have been unable to move or be active. If the spirometer includes an indicator to show the highest number that you have reached, your health care provider or respiratory therapist will help you set a goal. Keep a log of your progress as told by your health care provider. What are the risks? Breathing too quickly may cause dizziness or cause you to pass out. Take your time so you do not get  dizzy or light-headed. If you are in pain, you may need to take pain medicine before doing incentive spirometry. It is harder to take a deep breath if you are having pain. How to use your incentive spirometer  Sit up on the edge of your bed or on a chair. Hold the incentive spirometer so that it is in an upright position. Before you use the spirometer, breathe out normally. Place the mouthpiece in your mouth. Make sure your lips are closed tightly around it. Breathe in slowly and as deeply as you can through your mouth, causing the piston or the ball to rise toward the top of the chamber. Hold your breath for 3-5 seconds, or for as long as possible. If the spirometer includes a coach indicator, use this to guide you in breathing. Slow down your breathing if the indicator goes above the marked areas. Remove the mouthpiece from your mouth and breathe out normally. The piston or ball will return to the bottom of the chamber. Rest for a few seconds, then repeat the steps 10 or more times. Take your time and take a few normal breaths between deep breaths so that you do not get dizzy or light-headed. Do this every 1-2 hours when you are awake. If the spirometer includes a goal marker to show the highest number you have reached (best effort), use this as a goal to work  toward during each repetition. After each set of 10 deep breaths, cough a few times. This will help to make sure that your lungs are clear. If you have an incision on your chest or abdomen from surgery, place a pillow or a rolled-up towel firmly against the incision when you cough. This can help to reduce pain while taking deep breaths and coughing. General tips When you are able to get out of bed: Walk around often. Continue to take deep breaths and cough in order to clear your lungs. Keep using the incentive spirometer until your health care provider says it is okay to stop using it. If you have been in the hospital, you may be told to keep using the spirometer at home. Contact a health care provider if: You are having difficulty using the spirometer. You have trouble using the spirometer as often as instructed. Your pain medicine is not giving enough relief for you to use the spirometer as told. You have a fever. Get help right away if: You develop shortness of breath. You develop a cough with bloody mucus from the lungs. You have fluid or blood coming from an incision site after you cough. Summary An incentive spirometer is a tool that can help you learn to take long, deep breaths to keep your lungs clear and active. You may be asked to use a spirometer after a surgery, if you have a lung problem or a history of smoking, or if you have been inactive for a long period of time. Use your incentive spirometer as instructed every 1-2 hours while you are awake. If you have an incision on your chest or abdomen, place a pillow or a rolled-up towel firmly against your incision when you cough. This will help to reduce pain. Get help right away if you have shortness of breath, you cough up bloody mucus, or blood comes from your incision when you cough. This information is not intended to replace advice given to you by your health care provider. Make sure you discuss any questions you have with your  health care provider. Document Revised:  04/23/2019 Document Reviewed: 04/23/2019   Please go to the following website to access important education materials concerning your upcoming joint replacement.                                   [email or web address]

## 2023-03-24 LAB — URINE CULTURE

## 2023-03-24 NOTE — Progress Notes (Signed)
  [facility] Perioperative Services: Pre-Admission/Anesthesia Testing  Abnormal Lab Notification   Date: 03/24/23  Name: Carol [name] MRN:   [id number]  Re: Abnormal labs noted during PAT appointment   Notified:  Provider Name Provider Role Notification Mode  Mardee Agent, MD Orthopedics (Surgeon) Routed and/or faxed via [facility]   Abnormal Lab Value(s):   Lab Results  Component Value Date   COLORURINE YELLOW (A) 03/22/2023   APPEARANCEUR HAZY (A) 03/22/2023   LABSPEC 1.009 03/22/2023   PHURINE 7.0 03/22/2023   GLUCOSEU NEGATIVE 03/22/2023   HGBUR SMALL (A) 03/22/2023   BILIRUBINUR NEGATIVE 03/22/2023   KETONESUR NEGATIVE 03/22/2023   PROTEINUR NEGATIVE 03/22/2023   NITRITE NEGATIVE 03/22/2023   LEUKOCYTESUR MODERATE (A) 03/22/2023   EPIU 0-5 03/22/2023   WBCU >50 03/22/2023   RBCU 0-5 03/22/2023   BACTERIA RARE (A) 03/22/2023   CULT (A) 03/22/2023    >=100,000 COLONIES/mL GROUP B STREP(S.AGALACTIAE)ISOLATED TESTING AGAINST S. AGALACTIAE NOT ROUTINELY PERFORMED DUE TO PREDICTABILITY OF AMP/PEN/VAN SUSCEPTIBILITY. Performed at [facility], [address], [place], [place] [address]    Clinical Information and Notes:  Patient is scheduled for COMPUTER ASSISTED TOTAL KNEE ARTHROPLASTY - RNFA (Right: Knee) on 03/28/2023.    UA performed in PAT consistent with/concerning for infection.  No leukocytosis noted on CBC; WBC 4.6 Renal function: Estimated Creatinine Clearance: 61.2 mL/min (by C-G formula based on SCr of 0.75 mg/dL). Urine C&S added to assess for pathogenically significant growth.   Impression and Plan:  Carol [name] Ill with a UA that was (+) for infection; reflex culture sent. Culture (+) for significant  Strep agalactiae (group B strep) colony count. Attempted to reach out to patient to discuss, however I could not reach her by phone today. I did send a MyChart message, however patient has not read as of 2100 PM on 03/24/2023.  Forwarding final culture results to MD for review and consideration of preoperative treatment as deemed appropriate by Dr. Hooten.   Dorise Pereyra, MSN, APRN, FNP-C, CEN [facility]  Perioperative Services Nurse Practitioner Phone: [phone] Fax: [phone] 03/24/23 9:04 PM  NOTE: This note has been prepared using Dragon dictation software. Despite my best ability to proofread, there is always the potential that unintentional transcriptional errors may still occur from this process.

## 2023-03-25 ENCOUNTER — Other Ambulatory Visit: Payer: Self-pay | Admitting: Orthopedic Surgery

## 2023-03-25 MED ORDER — AMOXICILLIN 500 MG PO TABS: 500.0000 mg | ORAL_TABLET | Freq: Two times a day (BID) | ORAL | 0 refills | Status: DC

## 2023-03-27 ENCOUNTER — Encounter: Payer: Self-pay | Admitting: Orthopedic Surgery

## 2023-03-27 NOTE — H&P (Signed)
 ORTHOPAEDIC HISTORY & PHYSICAL Drake Fonda Loving, [place] - 03/21/2023 3:30 PM EST Formatting of this note is different from the original. NAME: Carol [name] H&P Date: 03/21/2023 Procedure Date: 03/28/2023  Chief Complaint: right knee pain, instability, and swelling  HPI Carol [name] is a 73 y.o. female who has severe right knee pain. She states that she has many years of right knee pain that has persisted. She does report that most of the pain localizes along the lateral aspect of her knee. She does report sometimes that the pain seems to travel up her leg towards her right hip. 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 Mardee previously in 2013 (right) and 2015 (left). She does report some intermittent swelling as well as sometimes feelings of giving way. The pain is made worse with any prolonged weightbearing or standing activity. It has begun to affect her ability to ambulate long distances and perform her ADLs. She has failed conservative treatment including Tylenol , NSAIDs, bracing, intra-articular corticosteroid injections, and activity modification . She does not utilize any ambulatory aids. She has requested operative intervention for relief of her DJD symptoms. She denies any cardiac or pulmonary history. Denies any history of clots or DVTs. Patient is known to be a prediabetic, her last A1c was at 6.0. Patient does have a significant history of osteoporosis as well as Hashimoto's disease.  Social Hx: Patient states that she lives at home alone, widowed. She is retired. She has a son and daughter that live in the area that may be able to help with her postoperatively. She did inquire to potential short-term rehab. Denies any alcohol, nicotine or illicit drug use. Denies smoking.  Medications & Allergies Allergies: No Known Allergies  Home Medicines: Current Outpatient Medications on File Prior to Visit Medication Sig Dispense Refill acetaminophen   (TYLENOL ) 500 MG tablet Take 500 mg by mouth every 6 (six) hours as needed for Pain. aspirin  81 MG EC tablet Take 81 mg by mouth once daily atorvastatin  (LIPITOR) 10 MG tablet Take 10 mg by mouth every other day cyanocobalamin, vitamin B-12, 1,000 mcg Cap Take 1 tablet by mouth every other day fluticasone  (FLONASE ) 50 mcg/actuation nasal spray Place 2 sprays into both nostrils once daily levothyroxine  (SYNTHROID ) 25 MCG tablet Take 25 mcg by mouth once daily loratadine  (CLARITIN ) 10 mg tablet Take 10 mg by mouth once daily. losartan  (COZAAR ) 50 MG tablet Take 50 mg by mouth once daily meclizine  (ANTIVERT ) 25 mg tablet Take 25 mg by mouth 4 (four) times daily as needed meloxicam  (MOBIC ) 15 MG tablet Take 15 mg by mouth once daily multivitamin with minerals tablet Take 1 tablet by mouth once daily amLODIPine  (NORVASC ) 5 MG tablet Take 5 mg by mouth once daily. (Patient not taking: Reported on 03/21/2023) lidocaine  HCL 4 % lqro Apply 1 Application topically 4 (four) times daily as needed (Patient not taking: Reported on 03/21/2023)  No current facility-administered medications on file prior to visit.  Medical / Surgical History  Past Medical History: Diagnosis Date Arthritis Hashimoto's thyroiditis 12/26/2017 Thyroid  ultrasound on 10/30/19 -- multinodular goiter noted -- ordered by ENT -- thyroid  biopsy which was normal. Hyperlipidemia 09/13/2017 Last Assessment & Plan: Chronic, ongoing. Taking every other day due to myalgia and reports this has improved symptoms. Continue current medication regimen. Lipid panel today, is fasting, + CMP. Return in 6 months Hypertension Osteoporosis 01/13/2015 Prediabetes 04/14/2018 A1C October 2019 = 5.8 Last Assessment & Plan: Recheck A1C. Continue focus on diet at home.  Past Surgical History: Procedure Laterality Date Right total hip arthroplasty 08/25/2011 Left total hip arhroplasty 11/28/2013   Physical Exam  Ht:157.5 cm (5' 2) Wt:71.9 kg (158  lb 9.6 oz) BMI: Body mass index is 29.01 kg/m.  General/Constitutional: No apparent distress: well-nourished and well developed. Eyes: Pupils equal, round with synchronous movement. Lymphatic: No palpable adenopathy. Respiratory: Patient has good chest rise and fall with inspiration and expiration. All lung fields are clear to auscultation bilaterally. There is no Rales, rhonchi or wheezes appreciated. Cardiovascular: Upon auscultation there is slightly tachycardic rate and normal rhythm without any murmurs, rubs, gallops or heaves appreciated. Noted to have 1 dropped beat, otherwise within normal limits. There does not appear to be any swelling down the lower extremities. Posterior tibial pulses appreciated bilaterally, 2+. Integumentary: No impressive skin lesions present, except as noted in detailed exam. Neuro/Psych: Normal mood and affect, oriented to person, place and time. Musculoskeletal: see exam below  Right knee knee exam Upon inspection of the patient's right knee there does not appear to be any skin changes, open abrasions, swelling or redness. There is a valgus alignment. Upon palpation, the patient reports having pain along the lateral aspect of their knee. Patient has 4 degrees off of full extension actively with ROM, and able to flex back to 116 degrees with mild pain. Varus and valgus stress testing shows positive pseudolaxity to varus stressing. The patella tracks well within the femoral groove from flexion into extension with mild crepitus appreciated. Anterior and posterior drawer testing negative. Patient is neurovascularly intact down their lower extremity to all dermatomes. Posterior tibial pulses appreciated 2+.  Imaging right Knee Imaging: No imaging was ordered today. However, the patient's x-rays were from 02/17/2023 were reviewed. There was noticeable narrowing of the lateral cartilage space with complete loss of articular cartilage, bone-on-bone interaction. There was an  overall noted valgus alignment. There were subchondral changes as well as osteophyte formation noted. No fractures, lytic lesions or gross deformities appreciated on films.  Assesment and Plan Knee DJD  I have recommended that Carol [name] undergo right total knee replacement. Consents has been signed. The risks, benefits, prognosis and alternatives including but not limited to DVT, PE, infection, neurovascular injury, failure of the procedure and death were explained to the patient and she is willing to proceed with surgery as described to her by myself. Plan will be for post operative admission of at least 1 midnight for pain control and PT. She will be managed with DVT prophylaxis, antibiotics preoperatively for 24 hours and aggressive in patient rehab.  Pre, intra and post op interventions were discussed. Patient has good understanding  Medication Reconciliation was performed. Discussed cessation of NSAIDs, vitamins and supplements.  A total of 45 minutes was spent reviewing patient's charts, medical reconciliation, discussing/educating the patient about surgical interventions, and answering any questions provided by the patient.  JOSHUA DALLAS KOYANAGI, PA [facility] 03/21/2023  Electronically signed by Koyanagi Fonda Dallas, PA at 03/21/2023 4:29 PM EST

## 2023-03-28 ENCOUNTER — Observation Stay: Payer: Medicare Other

## 2023-03-28 ENCOUNTER — Ambulatory Visit: Payer: Medicare Other | Admitting: Urgent Care

## 2023-03-28 ENCOUNTER — Encounter: Payer: Self-pay | Admitting: Orthopedic Surgery

## 2023-03-28 ENCOUNTER — Observation Stay
Admission: RE | Admit: 2023-03-28 | Discharge: 2023-03-29 | Disposition: A | Discharge status: Home / Self Care | Payer: Medicare Other | Attending: Orthopedic Surgery | Admitting: Orthopedic Surgery

## 2023-03-28 ENCOUNTER — Other Ambulatory Visit: Payer: Self-pay

## 2023-03-28 ENCOUNTER — Encounter
Admission: RE | Disposition: A | Discharge status: Home / Self Care | Payer: Self-pay | Source: Home / Self Care | Attending: Orthopedic Surgery

## 2023-03-28 DIAGNOSIS — Z96651 Presence of right artificial knee joint: Secondary | ICD-10-CM

## 2023-03-28 DIAGNOSIS — I10 Essential (primary) hypertension: Secondary | ICD-10-CM | POA: Diagnosis not present

## 2023-03-28 DIAGNOSIS — M1711 Unilateral primary osteoarthritis, right knee: Principal | ICD-10-CM | POA: Insufficient documentation

## 2023-03-28 DIAGNOSIS — Z96643 Presence of artificial hip joint, bilateral: Secondary | ICD-10-CM | POA: Diagnosis not present

## 2023-03-28 DIAGNOSIS — R801 Persistent proteinuria, unspecified: Secondary | ICD-10-CM

## 2023-03-28 DIAGNOSIS — Z7982 Long term (current) use of aspirin: Secondary | ICD-10-CM | POA: Diagnosis not present

## 2023-03-28 DIAGNOSIS — R7309 Other abnormal glucose: Secondary | ICD-10-CM

## 2023-03-28 DIAGNOSIS — R609 Edema, unspecified: Secondary | ICD-10-CM | POA: Diagnosis not present

## 2023-03-28 DIAGNOSIS — Z79899 Other long term (current) drug therapy: Secondary | ICD-10-CM | POA: Insufficient documentation

## 2023-03-28 HISTORY — PX: KNEE ARTHROPLASTY: SHX992

## 2023-03-28 SURGERY — ARTHROPLASTY, KNEE, TOTAL, USING IMAGELESS COMPUTER-ASSISTED NAVIGATION
Anesthesia: Spinal | Site: Knee | Laterality: Right

## 2023-03-28 MED ORDER — ONDANSETRON HCL 4 MG/2ML IJ SOLN: 4.0000 mg | Freq: Four times a day (QID) | INTRAMUSCULAR | Status: DC | PRN

## 2023-03-28 MED ORDER — GABAPENTIN 300 MG PO CAPS
300.0000 mg | ORAL_CAPSULE | Freq: Once | ORAL | Status: AC
  Administered 2023-03-28: 300 mg via ORAL

## 2023-03-28 MED ORDER — ONDANSETRON HCL 4 MG PO TABS: 4.0000 mg | ORAL_TABLET | Freq: Four times a day (QID) | ORAL | Status: DC | PRN

## 2023-03-28 MED ORDER — SENNOSIDES-DOCUSATE SODIUM 8.6-50 MG PO TABS
1.0000 | ORAL_TABLET | Freq: Two times a day (BID) | ORAL | Status: DC
  Administered 2023-03-28 – 2023-03-29 (×2): 1 via ORAL
  Filled 2023-03-28 (×2): qty 1

## 2023-03-28 MED ORDER — PROPOFOL 500 MG/50ML IV EMUL
INTRAVENOUS | Status: DC | PRN
Start: 2023-03-28 — End: 2023-03-28
  Administered 2023-03-28: 75 ug/kg/min via INTRAVENOUS

## 2023-03-28 MED ORDER — CEFAZOLIN SODIUM-DEXTROSE 2-4 GM/100ML-% IV SOLN
2.0000 g | Freq: Four times a day (QID) | INTRAVENOUS | Status: AC
  Administered 2023-03-28 (×2): 2 g via INTRAVENOUS
  Filled 2023-03-28 (×4): qty 100

## 2023-03-28 MED ORDER — PROPOFOL 1000 MG/100ML IV EMUL
INTRAVENOUS | Status: AC
  Filled 2023-03-28: qty 100

## 2023-03-28 MED ORDER — ALUM & MAG HYDROXIDE-SIMETH 200-200-20 MG/5ML PO SUSP: 30.0000 mL | ORAL | Status: DC | PRN

## 2023-03-28 MED ORDER — PROPOFOL 10 MG/ML IV BOLUS
INTRAVENOUS | Status: DC | PRN
  Administered 2023-03-28: 20 mg via INTRAVENOUS

## 2023-03-28 MED ORDER — PANTOPRAZOLE SODIUM 40 MG PO TBEC
40.0000 mg | DELAYED_RELEASE_TABLET | Freq: Two times a day (BID) | ORAL | Status: DC
  Administered 2023-03-28 – 2023-03-29 (×2): 40 mg via ORAL
  Filled 2023-03-28 (×2): qty 1

## 2023-03-28 MED ORDER — MECLIZINE HCL 25 MG PO TABS: 25.0000 mg | ORAL_TABLET | Freq: Three times a day (TID) | ORAL | Status: DC | PRN

## 2023-03-28 MED ORDER — PHENYLEPHRINE HCL-NACL 20-0.9 MG/250ML-% IV SOLN
INTRAVENOUS | Status: DC | PRN
  Administered 2023-03-28: 20 ug/min via INTRAVENOUS

## 2023-03-28 MED ORDER — CHLORHEXIDINE GLUCONATE 0.12 % MT SOLN
OROMUCOSAL | Status: AC
  Filled 2023-03-28: qty 15

## 2023-03-28 MED ORDER — TRANEXAMIC ACID-NACL 1000-0.7 MG/100ML-% IV SOLN
1000.0000 mg | INTRAVENOUS | Status: AC
  Administered 2023-03-28: 1000 mg via INTRAVENOUS

## 2023-03-28 MED ORDER — BISACODYL 10 MG RE SUPP: 10.0000 mg | Freq: Every day | RECTAL | Status: DC | PRN

## 2023-03-28 MED ORDER — TRAMADOL HCL 50 MG PO TABS
50.0000 mg | ORAL_TABLET | ORAL | Status: DC | PRN
  Administered 2023-03-28: 50 mg via ORAL
  Filled 2023-03-28: qty 1

## 2023-03-28 MED ORDER — MENTHOL 3 MG MT LOZG: 1.0000 | LOZENGE | OROMUCOSAL | Status: DC | PRN

## 2023-03-28 MED ORDER — GABAPENTIN 300 MG PO CAPS
ORAL_CAPSULE | ORAL | Status: AC
  Filled 2023-03-28: qty 1

## 2023-03-28 MED ORDER — CHLORHEXIDINE GLUCONATE 0.12 % MT SOLN
15.0000 mL | Freq: Once | OROMUCOSAL | Status: AC
  Administered 2023-03-28: 15 mL via OROMUCOSAL

## 2023-03-28 MED ORDER — FLUTICASONE PROPIONATE 50 MCG/ACT NA SUSP
2.0000 | Freq: Every day | NASAL | Status: DC
  Filled 2023-03-28: qty 16

## 2023-03-28 MED ORDER — ACETAMINOPHEN 10 MG/ML IV SOLN
INTRAVENOUS | Status: DC | PRN
  Administered 2023-03-28: 1000 mg via INTRAVENOUS

## 2023-03-28 MED ORDER — BUPIVACAINE HCL (PF) 0.5 % IJ SOLN: INTRAMUSCULAR | Status: DC | PRN

## 2023-03-28 MED ORDER — LACTATED RINGERS IV SOLN
INTRAVENOUS | Status: DC
Start: 2023-03-28 — End: 2023-03-28

## 2023-03-28 MED ORDER — BUPIVACAINE HCL (PF) 0.5 % IJ SOLN
INTRAMUSCULAR | Status: DC | PRN
  Administered 2023-03-28: 3 mL via INTRATHECAL

## 2023-03-28 MED ORDER — ORAL CARE MOUTH RINSE: 15.0000 mL | Freq: Once | OROMUCOSAL | Status: AC

## 2023-03-28 MED ORDER — DIPHENHYDRAMINE HCL 12.5 MG/5ML PO ELIX: 12.5000 mg | ORAL_SOLUTION | ORAL | Status: DC | PRN

## 2023-03-28 MED ORDER — OXYCODONE HCL 5 MG PO TABS: 10.0000 mg | ORAL_TABLET | ORAL | Status: DC | PRN

## 2023-03-28 MED ORDER — LOSARTAN POTASSIUM 50 MG PO TABS
25.0000 mg | ORAL_TABLET | Freq: Every day | ORAL | Status: DC
  Administered 2023-03-29: 25 mg via ORAL
  Filled 2023-03-28: qty 1

## 2023-03-28 MED ORDER — CELECOXIB 200 MG PO CAPS
200.0000 mg | ORAL_CAPSULE | Freq: Two times a day (BID) | ORAL | Status: DC
  Administered 2023-03-28 – 2023-03-29 (×2): 200 mg via ORAL
  Filled 2023-03-28 (×2): qty 1

## 2023-03-28 MED ORDER — DEXAMETHASONE SODIUM PHOSPHATE 10 MG/ML IJ SOLN
8.0000 mg | Freq: Once | INTRAMUSCULAR | Status: AC
  Administered 2023-03-28: 8 mg via INTRAVENOUS

## 2023-03-28 MED ORDER — METOCLOPRAMIDE HCL 10 MG PO TABS
10.0000 mg | ORAL_TABLET | Freq: Three times a day (TID) | ORAL | Status: DC
  Administered 2023-03-28 – 2023-03-29 (×4): 10 mg via ORAL
  Filled 2023-03-28 (×4): qty 1

## 2023-03-28 MED ORDER — TRANEXAMIC ACID-NACL 1000-0.7 MG/100ML-% IV SOLN
INTRAVENOUS | Status: AC
  Filled 2023-03-28: qty 100

## 2023-03-28 MED ORDER — ACETAMINOPHEN 325 MG PO TABS: 325.0000 mg | ORAL_TABLET | Freq: Four times a day (QID) | ORAL | Status: DC | PRN

## 2023-03-28 MED ORDER — LEVOTHYROXINE SODIUM 25 MCG PO TABS
25.0000 ug | ORAL_TABLET | Freq: Every day | ORAL | Status: DC
  Administered 2023-03-29: 25 ug via ORAL
  Filled 2023-03-28 (×2): qty 1

## 2023-03-28 MED ORDER — CELECOXIB 200 MG PO CAPS
400.0000 mg | ORAL_CAPSULE | Freq: Once | ORAL | Status: AC
  Administered 2023-03-28: 400 mg via ORAL

## 2023-03-28 MED ORDER — MAGNESIUM HYDROXIDE 400 MG/5ML PO SUSP
30.0000 mL | Freq: Every day | ORAL | Status: DC
  Administered 2023-03-29: 30 mL via ORAL
  Filled 2023-03-28: qty 30

## 2023-03-28 MED ORDER — SODIUM CHLORIDE 0.9 % IR SOLN
Status: DC | PRN
  Administered 2023-03-28: 3000 mL

## 2023-03-28 MED ORDER — ACETAMINOPHEN 10 MG/ML IV SOLN
1000.0000 mg | Freq: Four times a day (QID) | INTRAVENOUS | Status: DC
  Administered 2023-03-28 – 2023-03-29 (×3): 1000 mg via INTRAVENOUS
  Filled 2023-03-28 (×2): qty 100

## 2023-03-28 MED ORDER — HYDROMORPHONE HCL 1 MG/ML IJ SOLN: 0.5000 mg | INTRAMUSCULAR | Status: DC | PRN

## 2023-03-28 MED ORDER — LORATADINE 10 MG PO TABS
10.0000 mg | ORAL_TABLET | Freq: Every day | ORAL | Status: DC
  Administered 2023-03-29: 10 mg via ORAL
  Filled 2023-03-28: qty 1

## 2023-03-28 MED ORDER — ENSURE PRE-SURGERY PO LIQD
296.0000 mL | Freq: Once | ORAL | Status: AC
  Administered 2023-03-28: 296 mL via ORAL
  Filled 2023-03-28: qty 296

## 2023-03-28 MED ORDER — ASPIRIN 81 MG PO CHEW
CHEWABLE_TABLET | ORAL | Status: AC
  Filled 2023-03-28: qty 1

## 2023-03-28 MED ORDER — ASPIRIN 81 MG PO CHEW
81.0000 mg | CHEWABLE_TABLET | Freq: Two times a day (BID) | ORAL | Status: DC
  Administered 2023-03-28 – 2023-03-29 (×2): 81 mg via ORAL
  Filled 2023-03-28: qty 1

## 2023-03-28 MED ORDER — SURGIRINSE WOUND IRRIGATION SYSTEM - OPTIME
TOPICAL | Status: DC | PRN
Start: 2023-03-28 — End: 2023-03-28
  Administered 2023-03-28: 450 mL

## 2023-03-28 MED ORDER — FENTANYL CITRATE (PF) 100 MCG/2ML IJ SOLN
INTRAMUSCULAR | Status: DC | PRN
  Administered 2023-03-28: 50 ug via INTRAVENOUS

## 2023-03-28 MED ORDER — CHLORHEXIDINE GLUCONATE 4 % EX SOLN
60.0000 mL | Freq: Once | CUTANEOUS | Status: AC
  Administered 2023-03-28: 4 via TOPICAL

## 2023-03-28 MED ORDER — CELECOXIB 200 MG PO CAPS
ORAL_CAPSULE | ORAL | Status: AC
  Filled 2023-03-28: qty 2

## 2023-03-28 MED ORDER — FLEET ENEMA RE ENEM: 1.0000 | ENEMA | Freq: Once | RECTAL | Status: DC | PRN

## 2023-03-28 MED ORDER — DEXAMETHASONE SODIUM PHOSPHATE 10 MG/ML IJ SOLN
INTRAMUSCULAR | Status: AC
  Filled 2023-03-28: qty 1

## 2023-03-28 MED ORDER — FERROUS SULFATE 325 (65 FE) MG PO TABS
325.0000 mg | ORAL_TABLET | Freq: Two times a day (BID) | ORAL | Status: DC
  Administered 2023-03-28 – 2023-03-29 (×2): 325 mg via ORAL
  Filled 2023-03-28 (×2): qty 1

## 2023-03-28 MED ORDER — PHENOL 1.4 % MT LIQD: 1.0000 | OROMUCOSAL | Status: DC | PRN

## 2023-03-28 MED ORDER — OXYCODONE HCL 5 MG PO TABS: 5.0000 mg | ORAL_TABLET | ORAL | Status: DC | PRN

## 2023-03-28 MED ORDER — CEFAZOLIN SODIUM-DEXTROSE 2-4 GM/100ML-% IV SOLN
2.0000 g | INTRAVENOUS | Status: AC
Start: 2023-03-28 — End: 2023-03-28
  Administered 2023-03-28: 2 g via INTRAVENOUS

## 2023-03-28 MED ORDER — CEFAZOLIN SODIUM-DEXTROSE 2-4 GM/100ML-% IV SOLN
INTRAVENOUS | Status: AC
  Filled 2023-03-28: qty 100

## 2023-03-28 MED ORDER — SODIUM CHLORIDE 0.9 % IV SOLN: INTRAVENOUS | Status: DC

## 2023-03-28 MED ORDER — DEXMEDETOMIDINE HCL IN NACL 80 MCG/20ML IV SOLN
INTRAVENOUS | Status: DC | PRN
Start: 2023-03-28 — End: 2023-03-28
  Administered 2023-03-28: 12 ug via INTRAVENOUS
  Administered 2023-03-28 (×2): 4 ug via INTRAVENOUS

## 2023-03-28 MED ORDER — SODIUM CHLORIDE (PF) 0.9 % IJ SOLN
INTRAMUSCULAR | Status: DC | PRN
  Administered 2023-03-28: 120 mL via INTRAMUSCULAR

## 2023-03-28 MED ORDER — FENTANYL CITRATE (PF) 100 MCG/2ML IJ SOLN
INTRAMUSCULAR | Status: AC
  Filled 2023-03-28: qty 2

## 2023-03-28 MED ORDER — TRANEXAMIC ACID-NACL 1000-0.7 MG/100ML-% IV SOLN
1000.0000 mg | Freq: Once | INTRAVENOUS | Status: AC
  Administered 2023-03-28: 1000 mg via INTRAVENOUS

## 2023-03-28 MED ORDER — ATORVASTATIN CALCIUM 10 MG PO TABS: 10.0000 mg | ORAL_TABLET | ORAL | Status: DC

## 2023-03-28 SURGICAL SUPPLY — 64 items
ATTUNE PS FEM RT SZ 4 CEM KNEE (Femur) IMPLANT
ATTUNE PSRP INSR SZ4 6 KNEE (Insert) IMPLANT
BASEPLATE TIBIAL ROTATING SZ 4 (Knees) IMPLANT
BATTERY INSTRU NAVIGATION (MISCELLANEOUS) ×8 IMPLANT
BIT DRILL QUICK REL 1/8 2PK SL (BIT) ×2 IMPLANT
BLADE CLIPPER SURG (BLADE) IMPLANT
BLADE SAW 70X12.5 (BLADE) ×2 IMPLANT
BLADE SAW 90X13X1.19 OSCILLAT (BLADE) ×2 IMPLANT
BLADE SAW 90X25X1.19 OSCILLAT (BLADE) ×2 IMPLANT
BONE CEMENT GENTAMICIN (Cement) ×2 IMPLANT
BRUSH SCRUB EZ PLAIN DRY (MISCELLANEOUS) ×2 IMPLANT
CEMENT BONE GENTAMICIN 40 (Cement) IMPLANT
COOLER POLAR GLACIER W/PUMP (MISCELLANEOUS) ×2 IMPLANT
CUFF TRNQT CYL 24X4X16.5-23 (TOURNIQUET CUFF) IMPLANT
DRAPE SHEET LG 3/4 BI-LAMINATE (DRAPES) ×2 IMPLANT
DRSG AQUACEL AG ADV 3.5X14 (GAUZE/BANDAGES/DRESSINGS) ×2 IMPLANT
DRSG MEPILEX SACRM 8.7X9.8 (GAUZE/BANDAGES/DRESSINGS) ×2 IMPLANT
DRSG TEGADERM 4X4.75 (GAUZE/BANDAGES/DRESSINGS) ×2 IMPLANT
DURAPREP 26ML APPLICATOR (WOUND CARE) ×4 IMPLANT
ELECT CAUTERY BLADE 6.4 (BLADE) ×2 IMPLANT
ELECT REM PT RETURN 9FT ADLT (ELECTROSURGICAL) ×1
ELECTRODE REM PT RTRN 9FT ADLT (ELECTROSURGICAL) ×2 IMPLANT
EVACUATOR 1/8 PVC DRAIN (DRAIN) ×2 IMPLANT
EX-PIN ORTHOLOCK NAV 4X150 (PIN) ×4 IMPLANT
GAUZE XEROFORM 1X8 LF (GAUZE/BANDAGES/DRESSINGS) ×2 IMPLANT
GLOVE BIOGEL M STRL SZ7.5 (GLOVE) ×12 IMPLANT
GLOVE SURG UNDER POLY LF SZ8 (GLOVE) ×4 IMPLANT
GOWN STRL REUS W/ TWL LRG LVL3 (GOWN DISPOSABLE) ×2 IMPLANT
GOWN STRL REUS W/ TWL XL LVL3 (GOWN DISPOSABLE) ×2 IMPLANT
GOWN TOGA ZIPPER T7+ PEEL AWAY (MISCELLANEOUS) ×2 IMPLANT
HOLDER FOLEY CATH W/STRAP (MISCELLANEOUS) ×2 IMPLANT
HOOD PEEL AWAY T7 (MISCELLANEOUS) ×2 IMPLANT
IV NS IRRIG 3000ML ARTHROMATIC (IV SOLUTION) ×2 IMPLANT
KIT TURNOVER KIT A (KITS) ×2 IMPLANT
KNIFE SCULPS 14X20 (INSTRUMENTS) ×2 IMPLANT
MANIFOLD NEPTUNE II (INSTRUMENTS) ×4 IMPLANT
NDL SPNL 20GX3.5 QUINCKE YW (NEEDLE) ×4 IMPLANT
NEEDLE SPNL 20GX3.5 QUINCKE YW (NEEDLE) ×2
PACK TOTAL KNEE (MISCELLANEOUS) ×2 IMPLANT
PAD ABD DERMACEA PRESS 5X9 (GAUZE/BANDAGES/DRESSINGS) ×4 IMPLANT
PAD ARMBOARD 7.5X6 YLW CONV (MISCELLANEOUS) ×6 IMPLANT
PAD WRAPON POLAR KNEE (MISCELLANEOUS) ×2 IMPLANT
PATELLA MEDIAL ATTUN 35MM KNEE (Knees) IMPLANT
PENCIL SMOKE EVACUATOR COATED (MISCELLANEOUS) ×2 IMPLANT
PIN DRILL FIX HALF THREAD (BIT) ×4 IMPLANT
PIN FIXATION 1/8DIA X 3INL (PIN) ×2 IMPLANT
PULSAVAC PLUS IRRIG FAN TIP (DISPOSABLE) ×1
SOLUTION IRRIG SURGIPHOR (IV SOLUTION) ×2 IMPLANT
SPONGE DRAIN TRACH 4X4 STRL 2S (GAUZE/BANDAGES/DRESSINGS) ×2 IMPLANT
STAPLER SKIN PROX 35W (STAPLE) ×2 IMPLANT
STOCKINETTE STRL BIAS CUT 8X4 (MISCELLANEOUS) ×2 IMPLANT
STRAP TIBIA SHORT (MISCELLANEOUS) ×2 IMPLANT
SUCTION TUBE FRAZIER 10FR DISP (SUCTIONS) ×2 IMPLANT
SUT VIC AB 0 CT1 36 (SUTURE) ×2 IMPLANT
SUT VIC AB 1 CT1 36 (SUTURE) ×4 IMPLANT
SUT VIC AB 2-0 CT2 27 (SUTURE) ×2 IMPLANT
SYR 30ML LL (SYRINGE) ×4 IMPLANT
TIP FAN IRRIG PULSAVAC PLUS (DISPOSABLE) ×2 IMPLANT
TOWEL OR 17X26 4PK STRL BLUE (TOWEL DISPOSABLE) ×2 IMPLANT
TOWER CARTRIDGE SMART MIX (DISPOSABLE) ×2 IMPLANT
TRAP FLUID SMOKE EVACUATOR (MISCELLANEOUS) ×2 IMPLANT
TRAY FOLEY MTR SLVR 16FR STAT (SET/KITS/TRAYS/PACK) ×2 IMPLANT
WATER STERILE IRR 1000ML POUR (IV SOLUTION) ×2 IMPLANT
WRAPON POLAR PAD KNEE (MISCELLANEOUS) ×1

## 2023-03-28 NOTE — Anesthesia Procedure Notes (Signed)
 Spinal  Staffing Performed by: Forestine Igo, CRNA Authorized by: Lattie Poli, MD

## 2023-03-28 NOTE — Plan of Care (Signed)
  Problem: Pain Management: Goal: Pain level will decrease with appropriate interventions 03/28/2023 1755 by Arleene Belt, RN Outcome: Progressing 03/28/2023 1729 by Arleene Belt, RN Outcome: Progressing   Problem: Skin Integrity: Goal: Will show signs of wound healing Outcome: Progressing

## 2023-03-28 NOTE — Progress Notes (Signed)
 Patient is not able to walk the distance required to go the bathroom, or he/she is unable to safely negotiate stairs required to access the bathroom.  A 3in1 BSC will alleviate this problem   Carol [name] P. Angie Fava M.D.

## 2023-03-28 NOTE — Progress Notes (Signed)
 PT Cancellation Note  Patient Details Name: Carol [name] MRN: [id number] DOB: 1951-02-10   Cancelled Treatment:    Reason Eval/Treat Not Completed: Patient not medically ready.  PT consult received.  Chart reviewed.  Pt s/p R TKA with spinal anesthesia.  Pt's nurse reports pt does not have adequate LE strength return (s/p spinal anesthesia) to safely participate in therapy at this time.  Will re-attempt PT evaluation tomorrow.  Amador Junes, PT 03/28/23, 4:40 PM

## 2023-03-28 NOTE — Op Note (Signed)
 OPERATIVE NOTE  DATE OF SURGERY:  03/28/2023  PATIENT NAME:  CYNDAL DONATELLI   DOB: [birth year]  MRN: [id number]  PRE-OPERATIVE DIAGNOSIS: Degenerative arthrosis of the right knee, primary  POST-OPERATIVE DIAGNOSIS:  Same  PROCEDURE:  Right total knee arthroplasty using computer-assisted navigation  SURGEON:  Maxene Span. M.D.  ASSISTANT:  Benjiman Bras, PA-C (present and scrubbed throughout the case, critical for assistance with exposure, retraction, instrumentation, and closure)  ANESTHESIA: spinal  ESTIMATED BLOOD LOSS: 50 mL  FLUIDS REPLACED: 1100 mL of crystalloid  TOURNIQUET TIME: 89 minutes  DRAINS: 2 medium Hemovac drains  SOFT TISSUE RELEASES: Anterior cruciate ligament, posterior cruciate ligament, deep medial collateral ligament, patellofemoral ligament  IMPLANTS UTILIZED: DePuy Attune size 4 posterior stabilized femoral component (cemented), size 4 rotating platform tibial component (cemented), 35 mm medialized dome patella (cemented), and a 6 mm stabilized rotating platform polyethylene insert.  INDICATIONS FOR SURGERY: FEMI DALAL is a 73 y.o. year old female with a long history of progressive knee pain. X-rays demonstrated severe degenerative changes in tricompartmental fashion. The patient had not seen any significant improvement despite conservative nonsurgical intervention. After discussion of the risks and benefits of surgical intervention, the patient expressed understanding of the risks benefits and agree with plans for total knee arthroplasty.   The risks, benefits, and alternatives were discussed at length including but not limited to the risks of infection, bleeding, nerve injury, stiffness, blood clots, the need for revision surgery, cardiopulmonary complications, among others, and they were willing to proceed.  PROCEDURE IN DETAIL: The patient was brought into the operating room and, after adequate spinal anesthesia was achieved, a tourniquet was  placed on the patient's upper thigh. The patient's knee and leg were cleaned and prepped with alcohol and DuraPrep and draped in the usual sterile fashion. A "timeout" was performed as per usual protocol. The lower extremity was exsanguinated using an Esmarch, and the tourniquet was inflated to 300 mmHg. An anterior longitudinal incision was made followed by a standard mid vastus approach. The deep fibers of the medial collateral ligament were elevated in a subperiosteal fashion off of the medial flare of the tibia so as to maintain a continuous soft tissue sleeve. The patella was subluxed laterally and the patellofemoral ligament was incised. Inspection of the knee demonstrated severe degenerative changes with full-thickness loss of articular cartilage. Osteophytes were debrided using a rongeur. Anterior and posterior cruciate ligaments were excised. Two 4.0 mm Schanz pins were inserted in the femur and into the tibia for attachment of the array of trackers used for computer-assisted navigation. Hip center was identified using a circumduction technique. Distal landmarks were mapped using the computer. The distal femur and proximal tibia were mapped using the computer. The distal femoral cutting guide was positioned using computer-assisted navigation so as to achieve a 5 distal valgus cut. The femur was sized and it was felt that a size 4 femoral component was appropriate. A size 4 femoral cutting guide was positioned and the anterior cut was performed and verified using the computer. This was followed by completion of the posterior and chamfer cuts. Femoral cutting guide for the central box was then positioned in the center box cut was performed.  Attention was then directed to the proximal tibia. Medial and lateral menisci were excised. The extramedullary tibial cutting guide was positioned using computer-assisted navigation so as to achieve a 0 varus-valgus alignment and 3 posterior slope. The cut was  performed and verified using the computer. The proximal tibia  was sized and it was felt that a size 4 tibial tray was appropriate. Tibial and femoral trials were inserted followed by insertion of a 6 mm polyethylene insert. This allowed for excellent mediolateral soft tissue balancing both in flexion and in full extension. Finally, the patella was cut and prepared so as to accommodate a 35 mm medialized dome patella. A patella trial was placed and the knee was placed through a range of motion with excellent patellar tracking appreciated. The femoral trial was removed after debridement of posterior osteophytes. The central post-hole for the tibial component was reamed followed by insertion of a keel punch. Tibial trials were then removed. Cut surfaces of bone were irrigated with copious amounts of normal saline using pulsatile lavage and then suctioned dry. Polymethylmethacrylate cement with gentamicin was prepared in the usual fashion using a vacuum mixer. Cement was applied to the cut surface of the proximal tibia as well as along the undersurface of a size 4 rotating platform tibial component. Tibial component was positioned and impacted into place. Excess cement was removed using Personal assistant. Cement was then applied to the cut surfaces of the femur as well as along the posterior flanges of the size 4 femoral component. The femoral component was positioned and impacted into place. Excess cement was removed using Personal assistant. A 6 mm polyethylene trial was inserted and the knee was brought into full extension with steady axial compression applied. Finally, cement was applied to the backside of a 35 mm medialized dome patella and the patellar component was positioned and patellar clamp applied. Excess cement was removed using Personal assistant. After adequate curing of the cement, the tourniquet was deflated after a total tourniquet time of 89 minutes. Hemostasis was achieved using electrocautery. The knee was  irrigated with copious amounts of normal saline using pulsatile lavage followed by 450 ml of Surgiphor and then suctioned dry. 20 mL of 1.3% Exparel  and 60 mL of 0.25% Marcaine  in 40 mL of normal saline was injected along the posterior capsule, medial and lateral gutters, and along the arthrotomy site. A 6 mm stabilized rotating platform polyethylene insert was inserted and the knee was placed through a range of motion with excellent mediolateral soft tissue balancing appreciated and excellent patellar tracking noted. 2 medium drains were placed in the wound bed and brought out through separate stab incisions. The medial parapatellar portion of the incision was reapproximated using interrupted sutures of #1 Vicryl. Subcutaneous tissue was approximated in layers using first #0 Vicryl followed #2-0 Vicryl. The skin was approximated with skin staples. A sterile dressing was applied.  The patient tolerated the procedure well and was transported to the recovery room in stable condition.    Taydem Cavagnaro P. Dametria Tuzzolino, Jr., M.D.

## 2023-03-28 NOTE — Interval H&P Note (Signed)
 History and Physical Interval Note:  03/28/2023 11:24 AM  Carol [name]  has presented today for surgery, with the diagnosis of PRIMARY OSTEOARTHRITIS OF RIGHT KNEE..  The various methods of treatment have been discussed with the patient and family. After consideration of risks, benefits and other options for treatment, the patient has consented to  Procedure(s): COMPUTER ASSISTED TOTAL KNEE ARTHROPLASTY - RNFA (Right) as a surgical intervention.  The patient's history has been reviewed, patient examined, no change in status, stable for surgery.  I have reviewed the patient's chart and labs.  Questions were answered to the patient's satisfaction.     Daiya Tamer P Lashayla Armes

## 2023-03-28 NOTE — Anesthesia Preprocedure Evaluation (Signed)
 Anesthesia Evaluation  Patient identified by MRN, date of birth, ID band Patient awake    Reviewed: Allergy & Precautions, NPO status , Patient's Chart, lab work & pertinent test results  History of Anesthesia Complications Negative for: history of anesthetic complications  Airway Mallampati: II  TM Distance: >3 FB Neck ROM: Full    Dental no notable dental hx. (+) Teeth Intact   Pulmonary neg pulmonary ROS, neg sleep apnea, neg COPD, Patient abstained from smoking.Not current smoker   Pulmonary exam normal breath sounds clear to auscultation       Cardiovascular Exercise Tolerance: Good METShypertension, Pt. on medications (-) CAD and (-) Past MI (-) dysrhythmias  Rhythm:Regular Rate:Normal - Systolic murmurs    Neuro/Psych negative neurological ROS  negative psych ROS   GI/Hepatic ,GERD  Controlled,,(+)     (-) substance abuse    Endo/Other  neg diabetesHypothyroidism    Renal/GU negative Renal ROS     Musculoskeletal  (+) Arthritis ,    Abdominal   Peds  Hematology   Anesthesia Other Findings Past Medical History: No date: Alkaline phosphatase elevation No date: Allergy No date: Eczema No date: GERD (gastroesophageal reflux disease) No date: Hashimoto's thyroiditis No date: Heart murmur No date: Hypertension No date: Hypothyroidism No date: Knee pain, right No date: Low hemoglobin No date: Mixed hyperlipidemia No date: Muscle cramps No date: Osteoarthritis No date: Osteoporosis 12/18/2014: Osteoporosis No date: Persistent proteinuria No date: Pre-diabetes  Reproductive/Obstetrics                              Anesthesia Physical Anesthesia Plan  ASA: 2  Anesthesia Plan: Spinal   Post-op Pain Management: Celebrex  PO (pre-op)*, Gabapentin  PO (pre-op)* and Ofirmev  IV (intra-op)*   Induction: Intravenous  PONV Risk Score and Plan: 2 and Ondansetron , Dexamethasone ,  Propofol  infusion, TIVA, Midazolam  and Treatment may vary due to age or medical condition  Airway Management Planned: Natural Airway  Additional Equipment: None  Intra-op Plan:   Post-operative Plan:   Informed Consent: I have reviewed the patients History and Physical, chart, labs and discussed the procedure including the risks, benefits and alternatives for the proposed anesthesia with the patient or authorized representative who has indicated his/her understanding and acceptance.       Plan Discussed with: CRNA and Surgeon  Anesthesia Plan Comments: (Discussed R/B/A of neuraxial anesthesia technique with patient: - rare risks of spinal/epidural hematoma, nerve damage, infection - Risk of PDPH - Risk of nausea and vomiting - Risk of conversion to general anesthesia and its associated risks, including sore throat, damage to lips/eyes/teeth/oropharynx, and rare risks such as cardiac and respiratory events. - Risk of allergic reactions  Discussed the role of CRNA in patient's perioperative care.  Patient voiced understanding.)         Anesthesia Quick Evaluation

## 2023-03-28 NOTE — Anesthesia Postprocedure Evaluation (Signed)
 Anesthesia Post Note  Patient: Carol [name]  Procedure(s) Performed: COMPUTER ASSISTED TOTAL KNEE ARTHROPLASTY - RNFA (Right: Knee)  Patient location during evaluation: PACU Anesthesia Type: Spinal Level of consciousness: oriented and awake and alert Pain management: pain level controlled Vital Signs Assessment: post-procedure vital signs reviewed and stable Respiratory status: spontaneous breathing, respiratory function stable and patient connected to nasal cannula oxygen Cardiovascular status: blood pressure returned to baseline and stable Postop Assessment: no headache, no backache, no apparent nausea or vomiting and spinal receding Anesthetic complications: no   No notable events documented.   Last Vitals:  Vitals:   03/28/23 1530 03/28/23 1545  BP: 119/61 (!) 110/94  Pulse: 92 92  Resp: 11 11  Temp: 36.7 C   SpO2: 99% 99%    Last Pain:  Vitals:   03/28/23 1545  TempSrc:   PainSc: Asleep                 Dorothey Gate

## 2023-03-28 NOTE — Transfer of Care (Signed)
 Immediate Anesthesia Transfer of Care Note  Patient: Carol [name]  Procedure(s) Performed: COMPUTER ASSISTED TOTAL KNEE ARTHROPLASTY - RNFA (Right: Knee)  Patient Location: PACU  Anesthesia Type:MAC and Spinal  Level of Consciousness: sedated  Airway & Oxygen Therapy: Patient Spontanous Breathing and Patient connected to face mask oxygen  Post-op Assessment: Report given to RN and Post -op Vital signs reviewed and stable  Post vital signs: stable  Last Vitals:  Vitals Value Taken Time  BP 119/61 03/28/23 1530  Temp 98   Pulse 94 03/28/23 1533  Resp 12 03/28/23 1533  SpO2 99 % 03/28/23 1533  Vitals shown include unfiled device data.  Last Pain:  Vitals:   03/28/23 0956  TempSrc: Temporal  PainSc: 0-No pain         Complications: No notable events documented.

## 2023-03-28 NOTE — Plan of Care (Signed)
   Problem: Pain Management: Goal: Pain level will decrease with appropriate interventions Outcome: Progressing   Problem: Skin Integrity: Goal: Will show signs of wound healing Outcome: Progressing

## 2023-03-28 NOTE — Anesthesia Procedure Notes (Addendum)
 Spinal  Patient location during procedure: OR Start time: 03/28/2023 12:02 PM End time: 03/28/2023 12:04 PM Reason for block: surgical anesthesia Staffing Performed: resident/CRNA  Anesthesiologist: Lattie Poli, MD Resident/CRNA: Wilkins Hardy I, CRNA Performed by: Wilkins Hardy I, CRNA Authorized by: Lattie Poli, MD   Preanesthetic Checklist Completed: patient identified, IV checked, site marked, risks and benefits discussed, surgical consent, monitors and equipment checked, pre-op evaluation and timeout performed Spinal Block Patient position: left lateral decubitus Prep: ChloraPrep and site prepped and draped Patient monitoring: heart rate Approach: midline Location: L3-4 Injection technique: single-shot Needle Needle type: Pencil-Tip  Needle gauge: 24 G Needle length: 9 cm Needle insertion depth: 6 cm Assessment Sensory level: T10 Events: CSF return Additional Notes Meticulous sterile technique used throughout (CHG prep, sterile gloves, sterile drape). Negative paresthesia. Negative blood return. Positive free-flowing CSF. Expiration date of kit checked and confirmed. Patient tolerated procedure well, without complications.

## 2023-03-29 ENCOUNTER — Encounter: Payer: Self-pay | Admitting: Orthopedic Surgery

## 2023-03-29 DIAGNOSIS — Z79899 Other long term (current) drug therapy: Secondary | ICD-10-CM | POA: Diagnosis not present

## 2023-03-29 DIAGNOSIS — Z7982 Long term (current) use of aspirin: Secondary | ICD-10-CM | POA: Diagnosis not present

## 2023-03-29 DIAGNOSIS — I10 Essential (primary) hypertension: Secondary | ICD-10-CM | POA: Diagnosis not present

## 2023-03-29 DIAGNOSIS — M1711 Unilateral primary osteoarthritis, right knee: Secondary | ICD-10-CM | POA: Diagnosis not present

## 2023-03-29 DIAGNOSIS — Z96643 Presence of artificial hip joint, bilateral: Secondary | ICD-10-CM | POA: Diagnosis not present

## 2023-03-29 MED ORDER — OXYCODONE HCL 5 MG PO TABS
5.0000 mg | ORAL_TABLET | ORAL | 0 refills | Status: DC | PRN
Start: 2023-03-29 — End: 2023-06-02

## 2023-03-29 MED ORDER — CELECOXIB 200 MG PO CAPS: 200.0000 mg | ORAL_CAPSULE | Freq: Two times a day (BID) | ORAL | 1 refills | Status: DC

## 2023-03-29 MED ORDER — ASPIRIN 81 MG PO TBEC: 81.0000 mg | DELAYED_RELEASE_TABLET | Freq: Two times a day (BID) | ORAL | Status: DC

## 2023-03-29 MED ORDER — TRAMADOL HCL 50 MG PO TABS
50.0000 mg | ORAL_TABLET | ORAL | 0 refills | Status: AC | PRN
Start: 2023-03-29 — End: ?

## 2023-03-29 MED ORDER — ACETAMINOPHEN 10 MG/ML IV SOLN
INTRAVENOUS | Status: AC
  Filled 2023-03-29: qty 100

## 2023-03-29 NOTE — Progress Notes (Signed)
Subjective: 1 Day Post-Op Procedure(s) (LRB): COMPUTER ASSISTED TOTAL KNEE ARTHROPLASTY - RNFA (Right) Patient reports pain as mild.   Patient seen in rounds with Dr. Ernest Pine. Patient is well, and has had no acute complaints or problems.  Denies any CP, SOB, N/B, fevers or chills We will start therapy today.  Plan is to go Home after hospital stay.  Objective: Vital signs in last 24 hours: Temp:  [97 F (36.1 C)-98 F (36.7 C)] 97.8 F (36.6 C) (02/11 0724) Pulse Rate:  [70-110] 70 (02/11 0724) Resp:  [11-18] 17 (02/11 0724) BP: (110-185)/(61-98) 146/69 (02/11 0724) SpO2:  [95 %-100 %] 97 % (02/11 0724) Weight:  [71.7 kg] 71.7 kg (02/10 0956)  Intake/Output from previous day:  Intake/Output Summary (Last 24 hours) at 03/29/2023 0834 Last data filed at 03/29/2023 0604 Gross per 24 hour  Intake 2931.12 ml  Output 1460 ml  Net 1471.12 ml    Intake/Output this shift: No intake/output data recorded.  Labs: No results for input(s): "HGB" in the last 72 hours. No results for input(s): "WBC", "RBC", "HCT", "PLT" in the last 72 hours. No results for input(s): "NA", "K", "CL", "CO2", "BUN", "CREATININE", "GLUCOSE", "CALCIUM" in the last 72 hours. No results for input(s): "LABPT", "INR" in the last 72 hours.  EXAM General - Patient is Alert and Appropriate and oriented Extremity - Neurologically intact ABD soft Neurovascular intact Sensation intact distally Intact pulses distally Dorsiflexion/Plantar flexion intact No cellulitis present Compartment soft Dressing - dressing C/D/I and no drainage Motor Function - intact, moving foot and toes well on exam.  Able to plantar and dorsiflex with good strength and range of motion.  Neurovascular intact all dermatomes extending down her right lower extremity. JP Drain pulled without difficulty. Intact  Past Medical History:  Diagnosis Date   Alkaline phosphatase elevation    Allergy    Eczema    GERD (gastroesophageal reflux  disease)    Hashimoto's thyroiditis    Heart murmur    Hypertension    Hypothyroidism    Knee pain, right    Low hemoglobin    Mixed hyperlipidemia    Muscle cramps    Osteoarthritis    Osteoporosis    Osteoporosis 12/18/2014   Persistent proteinuria    Pre-diabetes     Assessment/Plan: 1 Day Post-Op Procedure(s) (LRB): COMPUTER ASSISTED TOTAL KNEE ARTHROPLASTY - RNFA (Right) Principal Problem:   History of total knee arthroplasty, right  Estimated body mass index is 27.99 kg/m as calculated from the following:   Height as of this encounter: 5\' 3"  (1.6 m).   Weight as of this encounter: 71.7 kg. Advance diet Up with therapy  Patient will continue to work with physical therapy to pass postoperative PT protocols, ROM and strengthening  Discussed with the patient continuing to utilize Polar Care  Patient will use bone foam in 20-30 minute intervals  Patient will wear TED hose bilaterally to help prevent DVT and clot formation  Discussed the Aquacel bandage.  This bandage will stay in place 7 days postoperatively.  Can be replaced with honeycomb bandages that will be sent home with the patient  Discussed sending the patient home with tramadol and oxycodone for as needed pain management.  Patient will also be sent home with Celebrex to help with swelling and inflammation.  Patient will take an 81 mg aspirin twice daily for DVT prophylaxis  JP drain removed without difficulty, intact  Weight-Bearing as tolerated to right leg  Patient will follow-up with [facility] orthopedics  in 2 weeks for staple removal and reevaluation  Rayburn Go, PA-C [facility] 03/29/2023, 8:34 AM

## 2023-03-29 NOTE — Plan of Care (Signed)
  Problem: Education: Goal: Knowledge of the prescribed therapeutic regimen will improve Outcome: Progressing   Problem: Activity: Goal: Ability to avoid complications of mobility impairment will improve Outcome: Progressing   Problem: Skin Integrity: Goal: Will show signs of wound healing Outcome: Progressing   Problem: Education: Goal: Knowledge of General Education information will improve Description: Including pain rating scale, medication(s)/side effects and non-pharmacologic comfort measures Outcome: Progressing

## 2023-03-29 NOTE — Evaluation (Signed)
Physical Therapy Evaluation Patient Details Name: Carol [name] MRN: [id number] DOB: 08/21/1950 Today's Date: 03/29/2023  History of Present Illness  Pt is a 73 y.o. female s/p R TKA d/t degenerative arthrosis 03/28/23.  PMH includes, htn, Hashimoto's thyroiditis, heart murmur, low Hgb, muscle cramps, B hip replacements.  Clinical Impression  Prior to surgery, pt was independent with functional mobility (limited d/t R knee impairments); daughter lives with pt in a 1 level home with 2 STE B railings; has family support.  R knee pain 1-2/10 during session.  Able to perform R LE SLR independently.  R knee AROM flexion to 90 degrees.  Currently pt is modified independent supine to sitting EOB; SBA with transfers using RW; CGA progressing to SBA ambulating with RW in hallway; and CGA navigating 4 steps with R railing.  Pt educated on fall prevention, home safety, and safe car transfers: pt verbalizing appropriate understanding.  Pt would currently benefit from skilled PT to address noted impairments and functional limitations (see below for any additional details).  Upon hospital discharge, pt would benefit from ongoing therapy (pt planning for HHPT).  PA, TOC, and nursing updated on pt's status and DME needs.       If plan is discharge home, recommend the following: A little help with walking and/or transfers;A little help with bathing/dressing/bathroom;Assistance with cooking/housework;Assist for transportation;Help with stairs or ramp for entrance   Can travel by private vehicle    Yes    Equipment Recommendations Rolling walker (2 wheels);BSC/3in1  Recommendations for Other Services       Functional Status Assessment Patient has had a recent decline in their functional status and demonstrates the ability to make significant improvements in function in a reasonable and predictable amount of time.     Precautions / Restrictions Precautions Precautions: Knee;Fall Precaution Booklet Issued: Yes  (comment) Precaution/Restrictions Comments: KI if unable to SLR Restrictions Weight Bearing Restrictions Per Provider Order: Yes RLE Weight Bearing Per Provider Order: Weight bearing as tolerated      Mobility  Bed Mobility Overal bed mobility: Modified Independent Bed Mobility: Supine to Sit     Supine to sit: Modified independent (Device/Increase time)     General bed mobility comments: bed flat; mild increased effort to perform on own (supine to sitting EOB)    Transfers Overall transfer level: Needs assistance Equipment used: Rolling walker (2 wheels) Transfers: Sit to/from Stand Sit to Stand: Supervision           General transfer comment: x3 trials from bed and x1 trial from recliner; initial vc's for UE/LE placement and overall technique; steady transfer with RW use    Ambulation/Gait Ambulation/Gait assistance: Contact guard assist, Supervision Gait Distance (Feet): 160 Feet Assistive device: Rolling walker (2 wheels) Gait Pattern/deviations: Step-to pattern, Step-through pattern, Antalgic Gait velocity: decreased     General Gait Details: decreased stance time R LE; initially step to but progressing to step through gait pattern; steady ambulation with RW use  Stairs Stairs: Yes Stairs assistance: Contact guard assist Stair Management: One rail Right, Step to pattern, Sideways Number of Stairs: 4 General stair comments: steady navigating steps; initial vc's for LE sequencing/overall technique  Wheelchair Mobility     Tilt Bed    Modified Rankin (Stroke Patients Only)       Balance Overall balance assessment: Needs assistance Sitting-balance support: No upper extremity supported, Feet supported Sitting balance-Leahy Scale: Normal Sitting balance - Comments: steady reaching outside BOS   Standing balance support: No upper extremity supported  Standing balance-Leahy Scale: Fair Standing balance comment: steady static standing                              Pertinent Vitals/Pain Pain Assessment Pain Assessment: 0-10 Pain Score: 2  Pain Location: R knee Pain Descriptors / Indicators: Sore Pain Intervention(s): Limited activity within patient's tolerance, Monitored during session, Repositioned (OT to place polar care end of OT session) HR 82-97 bpm and SpO2 sats 94% or greater on room air during sessions activities.    Home Living Family/patient expects to be discharged to:: Private residence Living Arrangements: Children Available Help at Discharge: Family;Available PRN/intermittently Type of Home: House Home Access: Stairs to enter Entrance Stairs-Rails: Doctor, general practice of Steps: 2   Home Layout: One level Home Equipment: None      Prior Function Prior Level of Function : Independent/Modified Independent             Mobility Comments: Independent with ambulation; no recent falls reported.       Extremity/Trunk Assessment   Upper Extremity Assessment Upper Extremity Assessment: Overall WFL for tasks assessed    Lower Extremity Assessment Lower Extremity Assessment: RLE deficits/detail (L LE WFL) RLE Deficits / Details: at least 3/5 AROM hip flexion and ankle DF/PF; good R isometric quad set strength; able to perform R LE SLR independently    Cervical / Trunk Assessment Cervical / Trunk Assessment: Normal  Communication   Communication Communication: No apparent difficulties Factors Affecting Communication: Hearing impaired (Appeared HOH at times)    Cognition Arousal: Alert Behavior During Therapy: WFL for tasks assessed/performed   PT - Cognitive impairments: No apparent impairments                         Following commands: Intact       Cueing Cueing Techniques: Verbal cues, Visual cues     General Comments General comments (skin integrity, edema, etc.): mild drainage noted R knee dressing    Exercises Total Joint Exercises Ankle Circles/Pumps:  AROM, Strengthening, Both, 10 reps, Supine Quad Sets: AROM, Strengthening, Both, 10 reps, Supine Short Arc Quad: AROM, Strengthening, Right, 10 reps, Supine Heel Slides: AROM, Strengthening, Right, 10 reps, Supine Hip ABduction/ADduction: AAROM, Strengthening, Right, 10 reps, Supine Straight Leg Raises: AROM, Strengthening, Right, 10 reps, Supine Long Arc Quad: AROM, Strengthening, Right, 10 reps, Seated Knee Flexion: AROM, Strengthening, Right, 10 reps, Seated Goniometric ROM: R knee AROM extension 5 degrees short of neutral and flexion to 90 degrees   Assessment/Plan    PT Assessment Patient needs continued PT services  PT Problem List Decreased strength;Decreased range of motion;Decreased activity tolerance;Decreased balance;Decreased mobility;Decreased knowledge of use of DME;Decreased knowledge of precautions;Decreased skin integrity;Pain       PT Treatment Interventions DME instruction;Gait training;Stair training;Functional mobility training;Therapeutic activities;Therapeutic exercise;Balance training;Patient/family education    PT Goals (Current goals can be found in the Care Plan section)  Acute Rehab PT Goals Patient Stated Goal: to improve walking and pain PT Goal Formulation: With patient Time For Goal Achievement: 04/12/23 Potential to Achieve Goals: Good    Frequency BID     Co-evaluation               AM-PAC PT "6 Clicks" Mobility  Outcome Measure Help needed turning from your back to your side while in a flat bed without using bedrails?: None Help needed moving from lying on your back to sitting on the side  of a flat bed without using bedrails?: None Help needed moving to and from a bed to a chair (including a wheelchair)?: A Little Help needed standing up from a chair using your arms (e.g., wheelchair or bedside chair)?: A Little Help needed to walk in hospital room?: A Little Help needed climbing 3-5 steps with a railing? : A Little 6 Click Score:  20    End of Session Equipment Utilized During Treatment: Gait belt Activity Tolerance: Patient tolerated treatment well Patient left: in chair;Other (comment) (with OT present for OT session) Nurse Communication: Mobility status;Precautions;Weight bearing status;Other (comment) (pt's DME needs (RW and BSC)) PT Visit Diagnosis: Other abnormalities of gait and mobility (R26.89);Muscle weakness (generalized) (M62.81);Pain Pain - Right/Left: Right Pain - part of body: Knee    Time: 1610-9604 PT Time Calculation (min) (ACUTE ONLY): 35 min   Charges:   PT Evaluation $PT Eval Low Complexity: 1 Low PT Treatments $Gait Training: 8-22 mins $Therapeutic Exercise: 8-22 mins PT General Charges $$ ACUTE PT VISIT: 1 Visit        Hendricks Limes, PT 03/29/23, 9:51 AM

## 2023-03-29 NOTE — Progress Notes (Signed)
DISCHARGE NOTE:   Pt dc with instructions given and IV removed. Pt voices no concerns or questions at this time. 3 in 1 and rolling walker delivered to pt's hospital room. Pt has medication scripts in hand, pt given 2 honeycomb dressings, and both TED hose on and in place. Pt wheeled down by staff to the medical mall entrance and Pt's family provided transportation.

## 2023-03-29 NOTE — Evaluation (Signed)
Occupational Therapy Evaluation Patient Details Name: Carol [name] MRN: [id number] DOB: [birth year] Today's Date: 03/29/2023   History of Present Illness   History of Present Illness: Pt is a 73 y.o. female s/p R TKA d/t degenerative arthrosis 03/28/23.  PMH includes, htn, Hashimoto's thyroiditis, heart murmur, low Hgb, muscle cramps, B hip replacements.     Clinical Impressions Pt seen for OT evaluation this date, alert and  oriented x4. PTA pt is MOD I-I in ADL/IADL, amb with no AD. Pt provided education re: polar care mgt, falls prevention strategies, home/routines modifications, DME/AE for LB bathing and dressing tasks, and compression stocking mgt via demo and handout. LB dressing completed with MIN A with education provided re: AE for LB dressing. Pt amb to bathroom with RW with supervision, frequent vcs for RW technique. Pt would benefit from skilled OT services including additional instruction in dressing techniques with or without assistive devices for dressing and bathing skills to support recall and carryover prior to discharge and ultimately to maximize safety, independence, and minimize falls risk and caregiver burden. OT will follow acutely.      If plan is discharge home, recommend the following:   A little help with bathing/dressing/bathroom     Functional Status Assessment   Patient has had a recent decline in their functional status and demonstrates the ability to make significant improvements in function in a reasonable and predictable amount of time.     Equipment Recommendations   BSC/3in1     Recommendations for Other Services         Precautions/Restrictions   Precautions Precautions: Knee;Fall Precaution/Restrictions Comments: KI if unable to [employer] Restrictions Weight Bearing Restrictions Per Provider Order: Yes RLE Weight Bearing Per Provider Order: Weight bearing as tolerated     Mobility Bed Mobility               General bed  mobility comments: NT in recliner pre/post session    Transfers Overall transfer level: Needs assistance Equipment used: Rolling walker (2 wheels) Transfers: Sit to/from Stand Sit to Stand: Supervision                  Balance Overall balance assessment: Needs assistance Sitting-balance support: No upper extremity supported, Feet supported Sitting balance-Leahy Scale: Normal     Standing balance support: Bilateral upper extremity supported, During functional activity Standing balance-Leahy Scale: Good                             ADL either performed or assessed with clinical judgement   ADL Overall ADL's : Needs assistance/impaired Eating/Feeding: Set up;Sitting   Grooming: Set up;Sitting           Upper Body Dressing : Set up;Sitting Upper Body Dressing Details (indicate cue type and reason): donn undershirt and shirt Lower Body Dressing: Minimal assistance Lower Body Dressing Details (indicate cue type and reason): donn under wear and pants, provided education re: LB dressing wtih AE PRN; MAX A for TED hose Toilet Transfer: Supervision/safety;Rolling walker (2 wheels);BSC/3in1 Toilet Transfer Details (indicate cue type and reason): no vcs for technique required Toileting- Clothing Manipulation and Hygiene: Supervision/safety;Sitting/lateral lean       Functional mobility during ADLs: Supervision/safety;Rolling walker (2 wheels) (approx 15' in the room two attempts with RW; frequent vcs for RW technique)       Vision Patient Visual Report: No change from baseline       Perception  Praxis         Pertinent Vitals/Pain Pain Assessment Pain Assessment: 0-10 Pain Score: 2  Pain Location: R knee Pain Descriptors / Indicators: Sore Pain Intervention(s): Limited activity within patient's tolerance, Monitored during session, Repositioned     Extremity/Trunk Assessment Upper Extremity Assessment Upper Extremity Assessment: Overall  WFL for tasks assessed   Lower Extremity Assessment Lower Extremity Assessment: RLE deficits/detail RLE Deficits / Details: s/p R TKA   Cervical / Trunk Assessment Cervical / Trunk Assessment: Normal   Communication Communication Communication: No apparent difficulties Factors Affecting Communication: Hearing impaired (Appeared HOH at times)   Cognition Arousal: Alert Behavior During Therapy: WFL for tasks assessed/performed Cognition: No apparent impairments                               Following commands: Intact       Cueing  General Comments   Cueing Techniques: Verbal cues;Visual cues      Exercises     Shoulder Instructions      Home Living Family/patient expects to be discharged to:: Private residence Living Arrangements: Children Available Help at Discharge: Family;Available PRN/intermittently Type of Home: House Home Access: Stairs to enter [employer] of Steps: 2 Entrance Stairs-Rails: Right;Left Home Layout: One level     Bathroom Shower/Tub: Producer, television/film/video: Standard     Home Equipment: None          Prior Functioning/Environment Prior Level of Function : Independent/Modified Independent             Mobility Comments: Independent with ambulation; no recent falls reported.      OT Problem List: Decreased activity tolerance;Decreased knowledge of use of DME or AE   OT Treatment/Interventions: Self-care/ADL training;Balance training;Therapeutic exercise;DME and/or AE instruction;Patient/family education      OT Goals(Current goals can be found in the care plan section)   Acute Rehab OT Goals Patient Stated Goal: return to PLOF OT Goal Formulation: With patient Time For Goal Achievement: 04/12/23 Potential to Achieve Goals: Good ADL Goals Pt Will Perform Grooming: with modified independence;standing;sitting Pt Will Perform Lower Body Dressing: with modified independence;sit to/from  stand;sitting/lateral leans;with adaptive equipment Pt Will Transfer to Toilet: with modified independence;ambulating Pt Will Perform Toileting - Clothing Manipulation and hygiene: with modified independence;sitting/lateral leans;sit to/from stand   OT Frequency:  Min 1X/week    Co-evaluation              AM-PAC OT "6 Clicks" Daily Activity     Outcome Measure Help from another person eating meals?: None Help from another person taking care of personal grooming?: None Help from another person toileting, which includes using toliet, bedpan, or urinal?: None Help from another person bathing (including washing, rinsing, drying)?: A Little Help from another person to put on and taking off regular upper body clothing?: None Help from another person to put on and taking off regular lower body clothing?: A Little 6 Click Score: 22   End of Session Equipment Utilized During Treatment: Rolling walker (2 wheels) Nurse Communication: Mobility status  Activity Tolerance: Patient tolerated treatment well Patient left: in chair;with call bell/phone within reach  OT Visit Diagnosis: Other abnormalities of gait and mobility (R26.89)                Time: 1610-9604 OT Time Calculation (min): 21 min Charges:  OT General Charges $OT Visit: 1 Visit OT Evaluation $OT Eval Low Complexity: 1 Low  Oleta Mouse, OTD OTR/L  03/29/23, 11:00 AM

## 2023-03-29 NOTE — Discharge Summary (Signed)
Physician Discharge Summary  Subjective: 1 Day Post-Op Procedure(s) (LRB): COMPUTER ASSISTED TOTAL KNEE ARTHROPLASTY - RNFA (Right) Patient reports pain as mild.   Patient seen in rounds with Dr. Ernest Pine. Patient is well, and has had no acute complaints or problems.  Denies any CP, SOB, N/B, fevers or chills We will start therapy today. Patient is ready to go home  Physician Discharge Summary  Patient ID: Carol [name] MRN: [id number] DOB/AGE: 72-Jan-1952 72 y.o.  Admit date: 03/28/2023 Discharge date: 03/29/2023  Admission Diagnoses:  Discharge Diagnoses:  Principal Problem:   History of total knee arthroplasty, right   Discharged Condition: good  Hospital Course: Patient presented to the hospital on 03/28/2023 for an elective right total knee arthroplasty performed by Dr. Ernest Pine. Patient was given 1g of TXA and 2g of Ancef prior to the procedure. she tolerated the procedure well without any complications. See procedural note below for details. Postoperatively, the patient did very well. she was able to pass PT protocols on post-op day one without any issues. JP drain was removed without any difficulty and was intact. she was able to void her bladder without any difficulty. Physical exam was unremarkable. she denies any SOB, CP, N/V, fevers or chills. Vital signs are stable. Patient is stable to discharge home.  PROCEDURE:  Right total knee arthroplasty using computer-assisted navigation   SURGEON:  Jena Gauss. M.D.   ASSISTANT:  Gean Birchwood, PA-C (present and scrubbed throughout the case, critical for assistance with exposure, retraction, instrumentation, and closure)   ANESTHESIA: spinal   ESTIMATED BLOOD LOSS: 50 mL   FLUIDS REPLACED: 1100 mL of crystalloid   TOURNIQUET TIME: 89 minutes   DRAINS: 2 medium Hemovac drains   SOFT TISSUE RELEASES: Anterior cruciate ligament, posterior cruciate ligament, deep medial collateral ligament, patellofemoral ligament    IMPLANTS UTILIZED: DePuy Attune size 4 posterior stabilized femoral component (cemented), size 4 rotating platform tibial component (cemented), 35 mm medialized dome patella (cemented), and a 6 mm stabilized rotating platform polyethylene insert.  Treatments: None  Discharge Exam: Blood pressure (!) 146/69, pulse 70, temperature 97.8 F (36.6 C), temperature source Oral, resp. rate 17, height 5\' 3"  (1.6 m), weight 71.7 kg, SpO2 97%.   Disposition: Home   Allergies as of 03/29/2023   No Known Allergies      Medication List     STOP taking these medications    meloxicam 15 MG tablet Commonly known as: MOBIC       TAKE these medications    amoxicillin 500 MG tablet Commonly known as: AMOXIL Take 1 tablet (500 mg total) by mouth 2 (two) times daily.   ARTHRITIS HOT EX Apply 1 Application topically 3 (three) times daily as needed (pain).   aspirin EC 81 MG tablet Take 1 tablet (81 mg total) by mouth in the morning and at bedtime. What changed: when to take this   atorvastatin 10 MG tablet Commonly known as: LIPITOR TAKE 1 TABLET BY MOUTH EVERY OTHER DAY What changed:  how much to take how to take this when to take this additional instructions   calcium carbonate 500 MG chewable tablet Commonly known as: TUMS - dosed in mg elemental calcium Chew 1-2 tablets by mouth 3 (three) times daily as needed for indigestion or heartburn (sour stomach/upset stomach.).   celecoxib 200 MG capsule Commonly known as: CELEBREX Take 1 capsule (200 mg total) by mouth 2 (two) times daily.   cetirizine 10 MG chewable tablet Commonly known as: Sales executive  10 mg by mouth daily.   fluticasone 50 MCG/ACT nasal spray Commonly known as: FLONASE USE 2 SPRAYS INTO EACH NOSTRIL ONCE DAILY   levothyroxine 25 MCG tablet Commonly known as: SYNTHROID Take 1 tablet (25 mcg total) by mouth daily. Take 30 minutes before other medications and eating in morning.   loratadine 10 MG  tablet Commonly known as: CLARITIN Take 10 mg by mouth in the morning.   losartan 50 MG tablet Commonly known as: COZAAR TAKE 1/2 TABLET (25 MG TOTAL) BY MOUTH DAILY   meclizine 25 MG tablet Commonly known as: ANTIVERT Take 1 tablet (25 mg total) by mouth 3 (three) times daily as needed for dizziness.   multivitamin with minerals Tabs tablet Take 1 tablet by mouth daily. Centrum Minis Silver Women's Multivitamin for Women   oxyCODONE 5 MG immediate release tablet Commonly known as: Oxy IR/ROXICODONE Take 1 tablet (5 mg total) by mouth every 4 (four) hours as needed for moderate pain (pain score 4-6) (pain score 4-6).   traMADol 50 MG tablet Commonly known as: ULTRAM Take 1-2 tablets (50-100 mg total) by mouth every 4 (four) hours as needed for moderate pain (pain score 4-6).   VITAMIN B-12 PO Take 1 tablet by mouth every other day.               Durable Medical Equipment  (From admission, onward)           Start     Ordered   03/28/23 1542  DME Walker rolling  Once       Question:  Patient needs a walker to treat with the following condition  Answer:  Total knee replacement status   03/28/23 1541   03/28/23 1542  DME Bedside commode  Once       Comments: Patient is not able to walk the distance required to go the bathroom, or he/she is unable to safely negotiate stairs required to access the bathroom.  A 3in1 BSC will alleviate this problem  Question:  Patient needs a bedside commode to treat with the following condition  Answer:  Total knee replacement status   03/28/23 1541            Follow-up Information     Rayburn Go, PA-C Follow up on 04/12/2023.   Specialty: Orthopedic Surgery Why: at 2:15pm Contact information: [address] [place] [address] [phone]         Donato Heinz, MD Follow up on 05/12/2023.   Specialty: Orthopedic Surgery Why: at 10:30am Contact information: [address] [facility]  [place] [address] [phone]                 Signed: Gean Birchwood 03/29/2023, 8:36 AM   Objective: Vital signs in last 24 hours: Temp:  [97 F (36.1 C)-98 F (36.7 C)] 97.8 F (36.6 C) (02/11 0724) Pulse Rate:  [70-110] 70 (02/11 0724) Resp:  [11-18] 17 (02/11 0724) BP: (110-185)/(61-98) 146/69 (02/11 0724) SpO2:  [95 %-100 %] 97 % (02/11 0724) Weight:  [71.7 kg] 71.7 kg (02/10 0956)  Intake/Output from previous day:  Intake/Output Summary (Last 24 hours) at 03/29/2023 0836 Last data filed at 03/29/2023 0604 Gross per 24 hour  Intake 2931.12 ml  Output 1460 ml  Net 1471.12 ml    Intake/Output this shift: No intake/output data recorded.  Labs: No results for input(s): "HGB" in the last 72 hours. No results for input(s): "WBC", "RBC", "HCT", "PLT" in the last 72 hours.  No results for input(s): "NA", "K", "CL", "CO2", "BUN", "CREATININE", "GLUCOSE", "CALCIUM" in the last 72 hours. No results for input(s): "LABPT", "INR" in the last 72 hours.  EXAM: General - Patient is Alert and Appropriate and oriented Extremity - Neurologically intact ABD soft Neurovascular intact Sensation intact distally Intact pulses distally Dorsiflexion/Plantar flexion intact No cellulitis present Compartment soft Dressing - dressing C/D/I and no drainage Motor Function - intact, moving foot and toes well on exam.  Able to plantar and dorsiflex with good strength and range of motion.  Neurovascular intact all dermatomes extending down her right lower extremity. JP Drain pulled without difficulty. Intact  Assessment/Plan: 1 Day Post-Op Procedure(s) (LRB): COMPUTER ASSISTED TOTAL KNEE ARTHROPLASTY - RNFA (Right) Procedure(s) (LRB): COMPUTER ASSISTED TOTAL KNEE ARTHROPLASTY - RNFA (Right) Past Medical History:  Diagnosis Date   Alkaline phosphatase elevation    Allergy    Eczema    GERD (gastroesophageal reflux disease)    Hashimoto's thyroiditis    Heart murmur     Hypertension    Hypothyroidism    Knee pain, right    Low hemoglobin    Mixed hyperlipidemia    Muscle cramps    Osteoarthritis    Osteoporosis    Osteoporosis 12/18/2014   Persistent proteinuria    Pre-diabetes    Principal Problem:   History of total knee arthroplasty, right  Estimated body mass index is 27.99 kg/m as calculated from the following:   Height as of this encounter: 5\' 3"  (1.6 m).   Weight as of this encounter: 71.7 kg. Patient will continue to work with physical therapy to pass postoperative PT protocols, ROM and strengthening   Discussed with the patient continuing to utilize Polar Care   Patient will use bone foam in 20-30 minute intervals   Patient will wear TED hose bilaterally to help prevent DVT and clot formation   Discussed the Aquacel bandage.  This bandage will stay in place 7 days postoperatively.  Can be replaced with honeycomb bandages that will be sent home with the patient   Discussed sending the patient home with tramadol and oxycodone for as needed pain management.  Patient will also be sent home with Celebrex to help with swelling and inflammation.  Patient will take an 81 mg aspirin twice daily for DVT prophylaxis   JP drain removed without difficulty, intact   Weight-Bearing as tolerated to right leg   Patient will follow-up with [facility] orthopedics in 2 weeks for staple removal and reevaluation  Diet - Regular diet Follow up - in 2 weeks Activity - WBAT Disposition - Home Condition Upon Discharge - Good DVT Prophylaxis - Aspirin and TED hose  Danise Edge, PA-C Orthopaedic Surgery 03/29/2023, 8:36 AM

## 2023-03-30 DIAGNOSIS — Z471 Aftercare following joint replacement surgery: Secondary | ICD-10-CM | POA: Diagnosis not present

## 2023-03-30 DIAGNOSIS — K219 Gastro-esophageal reflux disease without esophagitis: Secondary | ICD-10-CM | POA: Diagnosis not present

## 2023-03-30 DIAGNOSIS — Z79899 Other long term (current) drug therapy: Secondary | ICD-10-CM | POA: Diagnosis not present

## 2023-03-30 DIAGNOSIS — I10 Essential (primary) hypertension: Secondary | ICD-10-CM | POA: Diagnosis not present

## 2023-03-30 DIAGNOSIS — R7303 Prediabetes: Secondary | ICD-10-CM | POA: Diagnosis not present

## 2023-03-30 DIAGNOSIS — E063 Autoimmune thyroiditis: Secondary | ICD-10-CM | POA: Diagnosis not present

## 2023-03-30 DIAGNOSIS — Z96651 Presence of right artificial knee joint: Secondary | ICD-10-CM | POA: Diagnosis not present

## 2023-03-30 DIAGNOSIS — Z7982 Long term (current) use of aspirin: Secondary | ICD-10-CM | POA: Diagnosis not present

## 2023-03-30 DIAGNOSIS — M810 Age-related osteoporosis without current pathological fracture: Secondary | ICD-10-CM | POA: Diagnosis not present

## 2023-03-30 DIAGNOSIS — E782 Mixed hyperlipidemia: Secondary | ICD-10-CM | POA: Diagnosis not present

## 2023-04-01 ENCOUNTER — Other Ambulatory Visit: Payer: Self-pay | Admitting: Nurse Practitioner

## 2023-04-01 DIAGNOSIS — Z79899 Other long term (current) drug therapy: Secondary | ICD-10-CM | POA: Diagnosis not present

## 2023-04-01 DIAGNOSIS — I10 Essential (primary) hypertension: Secondary | ICD-10-CM | POA: Diagnosis not present

## 2023-04-01 DIAGNOSIS — Z7982 Long term (current) use of aspirin: Secondary | ICD-10-CM | POA: Diagnosis not present

## 2023-04-01 DIAGNOSIS — Z471 Aftercare following joint replacement surgery: Secondary | ICD-10-CM | POA: Diagnosis not present

## 2023-04-01 DIAGNOSIS — R7303 Prediabetes: Secondary | ICD-10-CM | POA: Diagnosis not present

## 2023-04-01 DIAGNOSIS — Z96651 Presence of right artificial knee joint: Secondary | ICD-10-CM | POA: Diagnosis not present

## 2023-04-01 DIAGNOSIS — E063 Autoimmune thyroiditis: Secondary | ICD-10-CM | POA: Diagnosis not present

## 2023-04-01 DIAGNOSIS — E782 Mixed hyperlipidemia: Secondary | ICD-10-CM | POA: Diagnosis not present

## 2023-04-01 DIAGNOSIS — K219 Gastro-esophageal reflux disease without esophagitis: Secondary | ICD-10-CM | POA: Diagnosis not present

## 2023-04-01 DIAGNOSIS — M810 Age-related osteoporosis without current pathological fracture: Secondary | ICD-10-CM | POA: Diagnosis not present

## 2023-04-02 DIAGNOSIS — K219 Gastro-esophageal reflux disease without esophagitis: Secondary | ICD-10-CM | POA: Diagnosis not present

## 2023-04-02 DIAGNOSIS — R7303 Prediabetes: Secondary | ICD-10-CM | POA: Diagnosis not present

## 2023-04-02 DIAGNOSIS — E063 Autoimmune thyroiditis: Secondary | ICD-10-CM | POA: Diagnosis not present

## 2023-04-02 DIAGNOSIS — Z79899 Other long term (current) drug therapy: Secondary | ICD-10-CM | POA: Diagnosis not present

## 2023-04-02 DIAGNOSIS — Z96651 Presence of right artificial knee joint: Secondary | ICD-10-CM | POA: Diagnosis not present

## 2023-04-02 DIAGNOSIS — M810 Age-related osteoporosis without current pathological fracture: Secondary | ICD-10-CM | POA: Diagnosis not present

## 2023-04-02 DIAGNOSIS — I10 Essential (primary) hypertension: Secondary | ICD-10-CM | POA: Diagnosis not present

## 2023-04-02 DIAGNOSIS — E782 Mixed hyperlipidemia: Secondary | ICD-10-CM | POA: Diagnosis not present

## 2023-04-02 DIAGNOSIS — Z7982 Long term (current) use of aspirin: Secondary | ICD-10-CM | POA: Diagnosis not present

## 2023-04-02 DIAGNOSIS — Z471 Aftercare following joint replacement surgery: Secondary | ICD-10-CM | POA: Diagnosis not present

## 2023-04-04 DIAGNOSIS — E782 Mixed hyperlipidemia: Secondary | ICD-10-CM | POA: Diagnosis not present

## 2023-04-04 DIAGNOSIS — I10 Essential (primary) hypertension: Secondary | ICD-10-CM | POA: Diagnosis not present

## 2023-04-04 DIAGNOSIS — K219 Gastro-esophageal reflux disease without esophagitis: Secondary | ICD-10-CM | POA: Diagnosis not present

## 2023-04-04 DIAGNOSIS — E063 Autoimmune thyroiditis: Secondary | ICD-10-CM | POA: Diagnosis not present

## 2023-04-04 DIAGNOSIS — Z96651 Presence of right artificial knee joint: Secondary | ICD-10-CM | POA: Diagnosis not present

## 2023-04-04 DIAGNOSIS — R7303 Prediabetes: Secondary | ICD-10-CM | POA: Diagnosis not present

## 2023-04-04 DIAGNOSIS — Z471 Aftercare following joint replacement surgery: Secondary | ICD-10-CM | POA: Diagnosis not present

## 2023-04-04 DIAGNOSIS — Z79899 Other long term (current) drug therapy: Secondary | ICD-10-CM | POA: Diagnosis not present

## 2023-04-04 DIAGNOSIS — M810 Age-related osteoporosis without current pathological fracture: Secondary | ICD-10-CM | POA: Diagnosis not present

## 2023-04-04 DIAGNOSIS — Z7982 Long term (current) use of aspirin: Secondary | ICD-10-CM | POA: Diagnosis not present

## 2023-04-04 NOTE — Telephone Encounter (Signed)
 Rxs were previously sent to local pharmacy.  Requested Prescriptions  Pending Prescriptions Disp Refills   atorvastatin (LIPITOR) 10 MG tablet [Pharmacy Med Name: ATORVASTATIN 10MG  TAB 10 Tablet] 45 tablet 10    Sig: TAKE 0.5 TABLET(S) BY MOUTH 1 TIMES PER DAY *NEW PRESCRIPTION REQUEST*     Cardiovascular:  Antilipid - Statins Failed - 04/04/2023 10:15 AM      Failed - Lipid Panel in normal range within the last 12 months    Cholesterol, Total  Date Value Ref Range Status  03/02/2023 156 100 - 199 mg/dL Final   LDL Chol Calc (NIH)  Date Value Ref Range Status  03/02/2023 89 0 - 99 mg/dL Final   HDL  Date Value Ref Range Status  03/02/2023 52 >39 mg/dL Final   Triglycerides  Date Value Ref Range Status  03/02/2023 77 0 - 149 mg/dL Final         Passed - Patient is not pregnant      Passed - Valid encounter within last 12 months    Recent Outpatient Visits           1 month ago Primary hypertension   [place] [facility] [place], [place] T, NP   2 months ago Chronic hand pain, left   [place] [facility] [place], [place] T, NP   3 months ago Acute pain of left wrist   [place] [facility] [place], [place] T, NP   4 months ago Primary hypertension   [place] [facility] [place], [place] T, NP   7 months ago Hashimoto's thyroiditis   [place] [facility] [place], [place] T, NP       Future Appointments             In 1 month Cannady, Hurt T, NP [place] [facility]             GNP CALCIUM 500 +D3 500-15 MG-MCG TABS [Pharmacy Med Name: CALCIUM 500+VIT D 600MG  TAB 500 MG-600 Tablet] 90 tablet 10    Sig: TAKE 1 TABLET BY MOUTH EVERY DAY *NEW PRESCRIPTION REQUEST*     Endocrinology: Minerals - Calcium Supplementation with Vitamin D Failed - 04/04/2023 10:15 AM      Failed - Vitamin D in normal range and within 360 days    No results found for: "IO9629BM8",  "UX3244WN0", "UV253GU4QIH", "25OHVITD3", "25OHVITD2", "25OHVITD1", "VD25OH"       Passed - Ca in normal range and within 360 days    Calcium  Date Value Ref Range Status  03/22/2023 9.5 8.9 - 10.3 mg/dL Final   Calcium, Total  Date Value Ref Range Status  12/20/2013 9.4 8.5 - 10.1 mg/dL Final         Passed - Valid encounter within last 12 months    Recent Outpatient Visits           1 month ago Primary hypertension   [place] [facility] [place], Corrie Dandy T, NP   2 months ago Chronic hand pain, left   [place] [facility] [place], Corrie Dandy T, NP   3 months ago Acute pain of left wrist   [place] [facility] [place], [employer] T, NP   4 months ago Primary hypertension   [place] [facility] [place], [place] T, NP   7 months ago Hashimoto's thyroiditis    [facility] [place], Dorie Rank, NP       Future Appointments  In 1 month Cannady, Dorie Rank, NP [place] [facility]             fluticasone (FLONASE) 50 MCG/ACT nasal spray [Pharmacy Med Name: FLUTICASONE NASAL 16 50 SUS] 16 g 10    Sig: USE 1 SPRAY IN EACH NOSTRIL 1 TIME PER DAY *NEW PRESCRIPTION REQUEST*     Ear, Nose, and Throat: Nasal Preparations - Corticosteroids Passed - 04/04/2023 10:15 AM      Passed - Valid encounter within last 12 months    Recent Outpatient Visits           1 month ago Primary hypertension   [place] [facility] [place], [place] T, NP   2 months ago Chronic hand pain, left   [place] [facility] [place], [place] T, NP   3 months ago Acute pain of left wrist   [place] [facility] [place], Corrie Dandy T, NP   4 months ago Primary hypertension   [place] [facility] [place], [place] T, NP   7 months ago Hashimoto's thyroiditis   [place] [facility] [place], Dorie Rank, NP       Future  Appointments             In 1 month Cannady, Dorie Rank, NP [place] [facility]             levothyroxine (SYNTHROID) 25 MCG tablet [Pharmacy Med Name: LEVOTHYROXINE TAB 25 Tablet] 90 tablet 10    Sig: TAKE 1 TABLET BY MOUTH EVERY DAY *NEW PRESCRIPTION REQUEST*     Endocrinology:  Hypothyroid Agents Failed - 04/04/2023 10:15 AM      Failed - TSH in normal range and within 360 days    TSH  Date Value Ref Range Status  03/02/2023 6.980 (H) 0.450 - 4.500 uIU/mL Final         Passed - Valid encounter within last 12 months    Recent Outpatient Visits           1 month ago Primary hypertension   [place] [facility] [place], [place] T, NP   2 months ago Chronic hand pain, left   [place] [facility] [place], [place] T, NP   3 months ago Acute pain of left wrist   [place] [facility] [place], [place] T, NP   4 months ago Primary hypertension   [place] [facility] [place], [place] T, NP   7 months ago Hashimoto's thyroiditis   [place] [facility] [place], Dorie Rank, NP       Future Appointments             In 1 month [place], [place] T, NP  [facility]             losartan (COZAAR) 50 MG tablet [Pharmacy Med Name: LOSARTAN 50MG  TAB 50 Tablet] 45 tablet 10    Sig: TAKE 0.5 TABLET(S) BY MOUTH 1 TIMES PER DAY *NEW PRESCRIPTION REQUEST*     Cardiovascular:  Angiotensin Receptor Blockers Passed - 04/04/2023 10:15 AM      Passed - Cr in normal range and within 180 days    Creatinine  Date Value Ref Range Status  12/20/2013 0.72 0.60 - 1.30 mg/dL Final   Creatinine, Ser  Date Value Ref Range Status  03/22/2023 0.75 0.44 - 1.00 mg/dL Final         Passed - K  in normal range and within 180 days    Potassium  Date Value Ref Range Status  03/22/2023 4.1 3.5 - 5.1 mmol/L Final  12/20/2013 3.5 3.5 - 5.1 mmol/L Final         Passed - Patient  is not pregnant      Passed - Last BP in normal range    BP Readings from Last 1 Encounters:  03/29/23 128/73         Passed - Valid encounter within last 6 months    Recent Outpatient Visits           1 month ago Primary hypertension   [place] [facility] [place], [place] T, NP   2 months ago Chronic hand pain, left   [place] [facility] [place], Corrie Dandy T, NP   3 months ago Acute pain of left wrist   [place] [facility] [place], [place] T, NP   4 months ago Primary hypertension   [place] [facility] [place], [place] T, NP   7 months ago Hashimoto's thyroiditis   [place] [facility] [place], [place] T, NP       Future Appointments             In 1 month Cannady, [place] T, NP [place] [facility]             ASPIRIN LOW DOSE 81 MG chewable tablet [Pharmacy Med Name: ASPIRIN 81MG  CHEW TAB 81 Tablet] 90 tablet 10    Sig: TAKE 1 TABLET BY MOUTH EVERY DAY *NEW PRESCRIPTION REQUEST*     Analgesics:  NSAIDS - aspirin Passed - 04/04/2023 10:15 AM      Passed - Cr in normal range and within 360 days    Creatinine  Date Value Ref Range Status  12/20/2013 0.72 0.60 - 1.30 mg/dL Final   Creatinine, Ser  Date Value Ref Range Status  03/22/2023 0.75 0.44 - 1.00 mg/dL Final         Passed - eGFR is 10 or above and within 360 days    EGFR (African American)  Date Value Ref Range Status  12/20/2013 >60 >35mL/min Final  08/28/2011 >60  Final   GFR calc Af Amer  Date Value Ref Range Status  03/18/2020 83 >59 mL/min/1.73 Final    Comment:    **In accordance with recommendations from the NKF-ASN Task force,**   [facility] is in the process of updating its eGFR calculation to the   2021 CKD-EPI creatinine equation that estimates kidney function   without a race variable.    EGFR (Non-African Amer.)  Date Value Ref Range Status  12/20/2013 >60 >36mL/min Final    Comment:     eGFR values <22mL/min/1.73 m2 may be an indication of chronic kidney disease (CKD). Calculated eGFR, using the MRDR Study equation, is useful in  patients with stable renal function. The eGFR calculation will not be reliable in acutely ill patients when serum creatinine is changing rapidly. It is not useful in patients on dialysis. The eGFR calculation may not be applicable to patients at the low and high extremes of body sizes, pregnant women, and vegetarians.   08/28/2011 >60  Final    Comment:    eGFR values <39mL/min/1.73 m2 may be an indication of chronic kidney disease (CKD). Calculated eGFR is useful in patients with stable renal function. The eGFR calculation will not be reliable in acutely ill patients when serum creatinine is changing rapidly. It  is not useful in  patients on dialysis. The eGFR calculation may not be applicable to patients at the low and high extremes of body sizes, pregnant women, and vegetarians.    GFR, Estimated  Date Value Ref Range Status  03/22/2023 >60 >60 mL/min Final    Comment:    (NOTE) Calculated using the CKD-EPI Creatinine Equation (2021)    eGFR  Date Value Ref Range Status  03/02/2023 67 >59 mL/min/1.73 Final         Passed - Patient is not pregnant      Passed - Valid encounter within last 12 months    Recent Outpatient Visits           1 month ago Primary hypertension   [place] [facility] [place], [place] T, NP   2 months ago Chronic hand pain, left   [place] [facility] [place], Corrie Dandy T, NP   3 months ago Acute pain of left wrist   [place] [facility] [place], Corrie Dandy T, NP   4 months ago Primary hypertension   [place] [facility] [place], [place] T, NP   7 months ago Hashimoto's thyroiditis   [place] [facility] [place], Dorie Rank, NP       Future Appointments             In 1 month Cannady, Dorie Rank, NP [place] [facility]             ALLERGY RELIEF CETIRIZINE 10 MG tablet [Pharmacy Med Name: CETIRIZINE 10MG  TAB 10 Tablet] 90 tablet 10    Sig: TAKE 1 TABLET BY MOUTH EVERY DAY *NEW PRESCRIPTION REQUEST*     Ear, Nose, and Throat:  Antihistamines 2 Passed - 04/04/2023 10:15 AM      Passed - Cr in normal range and within 360 days    Creatinine  Date Value Ref Range Status  12/20/2013 0.72 0.60 - 1.30 mg/dL Final   Creatinine, Ser  Date Value Ref Range Status  03/22/2023 0.75 0.44 - 1.00 mg/dL Final         Passed - Valid encounter within last 12 months    Recent Outpatient Visits           1 month ago Primary hypertension   [place] [facility] [place], Corrie Dandy T, NP   2 months ago Chronic hand pain, left   [place] [facility] [place], [place] T, NP   3 months ago Acute pain of left wrist   [place] [facility] [place], [place] T, NP   4 months ago Primary hypertension   [place] [facility] [place], [place] T, NP   7 months ago Hashimoto's thyroiditis   [place] [facility] [place], Dorie Rank, NP       Future Appointments             In 1 month Cannady, Dorie Rank, NP [place] [facility]             Cyanocobalamin (VITAMIN B-12) 1000 MCG SUBL [Pharmacy Med Name: VIT B-12 SL 1000 MCG TAB 1000 MCG Tablet] 90 tablet 10    Sig: TAKE 1 TABLET BY MOUTH EVERY DAY *NEW PRESCRIPTION REQUEST*     Endocrinology:  Vitamins - Vitamin B12 Failed - 04/04/2023 10:15 AM      Failed - B12 Level in normal range and within 360 days    No results found  for: "VITAMINB12"       Passed - HCT in normal range and within 360 days    HCT  Date Value Ref Range Status  03/22/2023 39.6 36.0 - 46.0 % Final   Hematocrit  Date Value Ref Range Status  03/02/2023 39.6 34.0 - 46.6 % Final         Passed - HGB in normal range and within 360 days    Hemoglobin  Date Value Ref Range Status  03/22/2023  13.1 12.0 - 15.0 g/dL Final  16/11/9602 54.0 11.1 - 15.9 g/dL Final         Passed - Valid encounter within last 12 months    Recent Outpatient Visits           1 month ago Primary hypertension   [place] [facility] [place], [place] T, NP   2 months ago Chronic hand pain, left   [place] [facility] [place], [place] T, NP   3 months ago Acute pain of left wrist   [place] [facility] [place], Corrie Dandy T, NP   4 months ago Primary hypertension   [place] [facility] [place], [place] T, NP   7 months ago Hashimoto's thyroiditis   [place] [facility] [place], Dorie Rank, NP       Future Appointments             In 1 month Cannady, Dorie Rank, NP [place] [facility]

## 2023-04-04 NOTE — Telephone Encounter (Signed)
 Requested medication (s) are due for refill today: unsure  Requested medication (s) are on the active medication list: no  Last refill:  calcium 500 vit D 600, cetrizine, vit B12  Future visit scheduled: yes  Notes to clinic:  not on pts med list as current medications, [facility] requesting, from previous rxs, all have been sent to [facility].     Requested Prescriptions  Pending Prescriptions Disp Refills   GNP CALCIUM 500 +D3 500-15 MG-MCG TABS [Pharmacy Med Name: CALCIUM 500+VIT D 600MG  TAB 500 MG-600 Tablet] 90 tablet 10    Sig: TAKE 1 TABLET BY MOUTH EVERY DAY *NEW PRESCRIPTION REQUEST*     Endocrinology: Minerals - Calcium Supplementation with Vitamin D Failed - 04/04/2023 10:17 AM      Failed - Vitamin D in normal range and within 360 days    No results found for: "[id number]", "NW2956OZ3", "YQ657QI6NGE", "25OHVITD3", "25OHVITD2", "25OHVITD1", "VD25OH"       Passed - Ca in normal range and within 360 days    Calcium  Date Value Ref Range Status  03/22/2023 9.5 8.9 - 10.3 mg/dL Final   Calcium, Total  Date Value Ref Range Status  12/20/2013 9.4 8.5 - 10.1 mg/dL Final         Passed - Valid encounter within last 12 months    Recent Outpatient Visits           1 month ago Primary hypertension   [place] [facility] [place], Corrie Dandy T, NP   2 months ago Chronic hand pain, left   [place] [facility] [place], Corrie Dandy T, NP   3 months ago Acute pain of left wrist   [place] [facility] [place], Corrie Dandy T, NP   4 months ago Primary hypertension   [place] [facility] [place], [place] T, NP   7 months ago Hashimoto's thyroiditis   [place] [facility] [place], Dorie Rank, NP       Future Appointments             In 1 month Cannady, Wayne T, NP [place] [facility]             ALLERGY RELIEF CETIRIZINE 10 MG tablet [Pharmacy Med Name: CETIRIZINE 10MG  TAB  10 Tablet] 90 tablet 10    Sig: TAKE 1 TABLET BY MOUTH EVERY DAY *NEW PRESCRIPTION REQUEST*     Ear, Nose, and Throat:  Antihistamines 2 Passed - 04/04/2023 10:17 AM      Passed - Cr in normal range and within 360 days    Creatinine  Date Value Ref Range Status  12/20/2013 0.72 0.60 - 1.30 mg/dL Final   Creatinine, Ser  Date Value Ref Range Status  03/22/2023 0.75 0.44 - 1.00 mg/dL Final         Passed - Valid encounter within last 12 months    Recent Outpatient Visits           1 month ago Primary hypertension   [place] [facility] [place], Corrie Dandy T, NP   2 months ago Chronic hand pain, left   [place] [facility] [place], Pawlet T, NP   3 months ago Acute pain of left wrist   [place] [facility] [place], [place] T, NP   4 months ago Primary hypertension   [place] [facility] [place], Garner T, NP   7 months ago Hashimoto's thyroiditis    [place] [facility]  Marjie Skiff, NP       Future Appointments             In 1 month Cannady, Dorie Rank, NP [place] [facility]             Cyanocobalamin (VITAMIN B-12) 1000 MCG SUBL [Pharmacy Med Name: VIT B-12 SL 1000 MCG TAB 1000 MCG Tablet] 90 tablet 10    Sig: TAKE 1 TABLET BY MOUTH EVERY DAY *NEW PRESCRIPTION REQUEST*     Endocrinology:  Vitamins - Vitamin B12 Failed - 04/04/2023 10:17 AM      Failed - B12 Level in normal range and within 360 days    No results found for: "VITAMINB12"       Passed - HCT in normal range and within 360 days    HCT  Date Value Ref Range Status  03/22/2023 39.6 36.0 - 46.0 % Final   Hematocrit  Date Value Ref Range Status  03/02/2023 39.6 34.0 - 46.6 % Final         Passed - HGB in normal range and within 360 days    Hemoglobin  Date Value Ref Range Status  03/22/2023 13.1 12.0 - 15.0 g/dL Final  09/81/1914 78.2 11.1 - 15.9 g/dL Final         Passed - Valid encounter within  last 12 months    Recent Outpatient Visits           1 month ago Primary hypertension   [place] [facility] [place], Pitcairn T, NP   2 months ago Chronic hand pain, left   [place] [facility] [place], Wilton T, NP   3 months ago Acute pain of left wrist   [place] [facility] [place], Stiles T, NP   4 months ago Primary hypertension   [place] [facility] [place], War T, NP   7 months ago Hashimoto's thyroiditis   [place] [facility] [place], Drake T, NP       Future Appointments             In 1 month [place], [place] T, NP [place] [facility]            Refused Prescriptions Disp Refills   atorvastatin (LIPITOR) 10 MG tablet [Pharmacy Med Name: ATORVASTATIN 10MG  TAB 10 Tablet] 45 tablet 10    Sig: TAKE 0.5 TABLET(S) BY MOUTH 1 TIMES PER DAY *NEW PRESCRIPTION REQUEST*     Cardiovascular:  Antilipid - Statins Failed - 04/04/2023 10:17 AM      Failed - Lipid Panel in normal range within the last 12 months    Cholesterol, Total  Date Value Ref Range Status  03/02/2023 156 100 - 199 mg/dL Final   LDL Chol Calc (NIH)  Date Value Ref Range Status  03/02/2023 89 0 - 99 mg/dL Final   HDL  Date Value Ref Range Status  03/02/2023 52 >39 mg/dL Final   Triglycerides  Date Value Ref Range Status  03/02/2023 77 0 - 149 mg/dL Final         Passed - Patient is not pregnant      Passed - Valid encounter within last 12 months    Recent Outpatient Visits           1 month ago Primary hypertension   [place] [facility] [place], [place] T, NP   2 months ago Chronic hand pain, left   [place]  [facility] [place], Corrie Dandy T, NP   3 months ago Acute pain of left wrist   [place] [facility] [place], Campti T, NP   4 months ago Primary hypertension   [place] [facility] [place], [place] T, NP   7 months ago  Hashimoto's thyroiditis   [place] [facility] [place], Dorie Rank, NP       Future Appointments             In 1 month Cannady, Dorie Rank, NP [place] [facility]             fluticasone (FLONASE) 50 MCG/ACT nasal spray [Pharmacy Med Name: FLUTICASONE NASAL 16 50 SUS] 16 g 10    Sig: USE 1 SPRAY IN EACH NOSTRIL 1 TIME PER DAY *NEW PRESCRIPTION REQUEST*     Ear, Nose, and Throat: Nasal Preparations - Corticosteroids Passed - 04/04/2023 10:17 AM      Passed - Valid encounter within last 12 months    Recent Outpatient Visits           1 month ago Primary hypertension   [place] [facility] [place], [place] T, NP   2 months ago Chronic hand pain, left   [place] [facility] [place], Corrie Dandy T, NP   3 months ago Acute pain of left wrist   [place] [facility] [place], Corrie Dandy T, NP   4 months ago Primary hypertension   [place] [facility] [place], Orosi T, NP   7 months ago Hashimoto's thyroiditis   [place] [facility] [place], Dorie Rank, NP       Future Appointments             In 1 month Cannady, Dorie Rank, NP [place] [facility]             levothyroxine (SYNTHROID) 25 MCG tablet [Pharmacy Med Name: LEVOTHYROXINE TAB 25 Tablet] 90 tablet 10    Sig: TAKE 1 TABLET BY MOUTH EVERY DAY *NEW PRESCRIPTION REQUEST*     Endocrinology:  Hypothyroid Agents Failed - 04/04/2023 10:17 AM      Failed - TSH in normal range and within 360 days    TSH  Date Value Ref Range Status  03/02/2023 6.980 (H) 0.450 - 4.500 uIU/mL Final         Passed - Valid encounter within last 12 months    Recent Outpatient Visits           1 month ago Primary hypertension   [place] [facility] [place], [place] T, NP   2 months ago Chronic hand pain, left   [place] [facility] [place], Parkwood T, NP   3 months ago  Acute pain of left wrist   [place] [facility] [place], Hackens[place] T, NP   4 months ago Primary hypertension   [place] [facility] [place], Ashville T, NP   7 months ago Hashimoto's thyroiditis   [place] [facility] [place], Dorie Rank, NP       Future Appointments             In 1 month Cannady, Dorie Rank, NP  [facility]             losartan (COZAAR) 50 MG tablet [Pharmacy Med Name: LOSARTAN 50MG  TAB 50 Tablet] 45 tablet 10    Sig: TAKE 0.5 TABLET(S)  BY MOUTH 1 TIMES PER DAY *NEW PRESCRIPTION REQUEST*     Cardiovascular:  Angiotensin Receptor Blockers Passed - 04/04/2023 10:17 AM      Passed - Cr in normal range and within 180 days    Creatinine  Date Value Ref Range Status  12/20/2013 0.72 0.60 - 1.30 mg/dL Final   Creatinine, Ser  Date Value Ref Range Status  03/22/2023 0.75 0.44 - 1.00 mg/dL Final         Passed - K in normal range and within 180 days    Potassium  Date Value Ref Range Status  03/22/2023 4.1 3.5 - 5.1 mmol/L Final  12/20/2013 3.5 3.5 - 5.1 mmol/L Final         Passed - Patient is not pregnant      Passed - Last BP in normal range    BP Readings from Last 1 Encounters:  03/29/23 128/73         Passed - Valid encounter within last 6 months    Recent Outpatient Visits           1 month ago Primary hypertension   [place] [facility] [place], Corrie Dandy T, NP   2 months ago Chronic hand pain, left   [place] [facility] [place], Corrie Dandy T, NP   3 months ago Acute pain of left wrist   [place] [facility] [place], Dovray T, NP   4 months ago Primary hypertension   [place] [facility] [place], [place] T, NP   7 months ago Hashimoto's thyroiditis   [place] [facility] [place], Sundown T, NP       Future Appointments             In 1 month Cannady, [place] T, NP  [facility]             ASPIRIN LOW DOSE 81 MG chewable tablet [Pharmacy Med Name: ASPIRIN 81MG  CHEW TAB 81 Tablet] 90 tablet 10    Sig: TAKE 1 TABLET BY MOUTH EVERY DAY *NEW PRESCRIPTION REQUEST*     Analgesics:  NSAIDS - aspirin Passed - 04/04/2023 10:17 AM      Passed - Cr in normal range and within 360 days    Creatinine  Date Value Ref Range Status  12/20/2013 0.72 0.60 - 1.30 mg/dL Final   Creatinine, Ser  Date Value Ref Range Status  03/22/2023 0.75 0.44 - 1.00 mg/dL Final         Passed - eGFR is 10 or above and within 360 days    EGFR (African American)  Date Value Ref Range Status  12/20/2013 >60 >56mL/min Final  08/28/2011 >60  Final   GFR calc Af Amer  Date Value Ref Range Status  03/18/2020 83 >59 mL/min/1.73 Final    Comment:    **In accordance with recommendations from the NKF-ASN Task force,**   [facility] is in the process of updating its eGFR calculation to the   2021 CKD-EPI creatinine equation that estimates kidney function   without a race variable.    EGFR (Non-African Amer.)  Date Value Ref Range Status  12/20/2013 >60 >96mL/min Final    Comment:    eGFR values <34mL/min/1.73 m2 may be an indication of chronic kidney disease (CKD). Calculated eGFR, using the MRDR Study equation, is useful in  patients with stable renal function. The eGFR calculation will not be reliable in acutely ill patients when serum creatinine is changing rapidly.  It is not useful in patients on dialysis. The eGFR calculation may not be applicable to patients at the low and high extremes of body sizes, pregnant women, and vegetarians.   08/28/2011 >60  Final    Comment:    eGFR values <42mL/min/1.73 m2 may be an indication of chronic kidney disease (CKD). Calculated eGFR is useful in patients with stable renal function. The eGFR calculation will not be reliable in acutely ill patients when serum creatinine is changing rapidly. It is not useful in  patients on  dialysis. The eGFR calculation may not be applicable to patients at the low and high extremes of body sizes, pregnant women, and vegetarians.    GFR, Estimated  Date Value Ref Range Status  03/22/2023 >60 >60 mL/min Final    Comment:    (NOTE) Calculated using the CKD-EPI Creatinine Equation (2021)    eGFR  Date Value Ref Range Status  03/02/2023 67 >59 mL/min/1.73 Final         Passed - Patient is not pregnant      Passed - Valid encounter within last 12 months    Recent Outpatient Visits           1 month ago Primary hypertension   [place] [facility] [place], [place] T, NP   2 months ago Chronic hand pain, left   [place] [facility] [place], Oakwood T, NP   3 months ago Acute pain of left wrist   [place] [facility] [place], Corrie Dandy T, NP   4 months ago Primary hypertension   [place] [facility] [place], Oce[place] T, NP   7 months ago Hashimoto's thyroiditis   [place] [facility] [place], Dorie Rank, NP       Future Appointments             In 1 month Cannady, Dorie Rank, NP [place] [facility]

## 2023-04-06 ENCOUNTER — Telehealth: Payer: Self-pay

## 2023-04-06 DIAGNOSIS — E063 Autoimmune thyroiditis: Secondary | ICD-10-CM | POA: Diagnosis not present

## 2023-04-06 DIAGNOSIS — M810 Age-related osteoporosis without current pathological fracture: Secondary | ICD-10-CM | POA: Diagnosis not present

## 2023-04-06 DIAGNOSIS — K219 Gastro-esophageal reflux disease without esophagitis: Secondary | ICD-10-CM | POA: Diagnosis not present

## 2023-04-06 DIAGNOSIS — Z471 Aftercare following joint replacement surgery: Secondary | ICD-10-CM | POA: Diagnosis not present

## 2023-04-06 DIAGNOSIS — E782 Mixed hyperlipidemia: Secondary | ICD-10-CM | POA: Diagnosis not present

## 2023-04-06 DIAGNOSIS — I10 Essential (primary) hypertension: Secondary | ICD-10-CM | POA: Diagnosis not present

## 2023-04-06 DIAGNOSIS — Z79899 Other long term (current) drug therapy: Secondary | ICD-10-CM | POA: Diagnosis not present

## 2023-04-06 DIAGNOSIS — Z7982 Long term (current) use of aspirin: Secondary | ICD-10-CM | POA: Diagnosis not present

## 2023-04-06 DIAGNOSIS — R7303 Prediabetes: Secondary | ICD-10-CM | POA: Diagnosis not present

## 2023-04-06 DIAGNOSIS — Z96651 Presence of right artificial knee joint: Secondary | ICD-10-CM | POA: Diagnosis not present

## 2023-04-06 MED ORDER — NYSTATIN 100000 UNIT/GM EX POWD: 1.0000 | Freq: Two times a day (BID) | CUTANEOUS | 1 refills | Status: DC

## 2023-04-06 NOTE — Telephone Encounter (Signed)
 Copied from CRM [id number]. Topic: Clinical - Medical Advice >> Apr 06, 2023  1:37 PM Ivette P wrote: Reason for CRM: Amy Physical Therapist called in to report that she saw pt today and noticed a skin infection in the stomach area, stated looks like a yeast infection rash. Would like to speak to nurse about prescribing powder medication. Callback [phone]

## 2023-04-08 DIAGNOSIS — Z7982 Long term (current) use of aspirin: Secondary | ICD-10-CM | POA: Diagnosis not present

## 2023-04-08 DIAGNOSIS — M810 Age-related osteoporosis without current pathological fracture: Secondary | ICD-10-CM | POA: Diagnosis not present

## 2023-04-08 DIAGNOSIS — I10 Essential (primary) hypertension: Secondary | ICD-10-CM | POA: Diagnosis not present

## 2023-04-08 DIAGNOSIS — Z96651 Presence of right artificial knee joint: Secondary | ICD-10-CM | POA: Diagnosis not present

## 2023-04-08 DIAGNOSIS — K219 Gastro-esophageal reflux disease without esophagitis: Secondary | ICD-10-CM | POA: Diagnosis not present

## 2023-04-08 DIAGNOSIS — E782 Mixed hyperlipidemia: Secondary | ICD-10-CM | POA: Diagnosis not present

## 2023-04-08 DIAGNOSIS — E063 Autoimmune thyroiditis: Secondary | ICD-10-CM | POA: Diagnosis not present

## 2023-04-08 DIAGNOSIS — Z79899 Other long term (current) drug therapy: Secondary | ICD-10-CM | POA: Diagnosis not present

## 2023-04-08 DIAGNOSIS — Z471 Aftercare following joint replacement surgery: Secondary | ICD-10-CM | POA: Diagnosis not present

## 2023-04-08 DIAGNOSIS — R7303 Prediabetes: Secondary | ICD-10-CM | POA: Diagnosis not present

## 2023-04-11 DIAGNOSIS — Z96651 Presence of right artificial knee joint: Secondary | ICD-10-CM | POA: Diagnosis not present

## 2023-04-11 DIAGNOSIS — K219 Gastro-esophageal reflux disease without esophagitis: Secondary | ICD-10-CM | POA: Diagnosis not present

## 2023-04-11 DIAGNOSIS — E063 Autoimmune thyroiditis: Secondary | ICD-10-CM | POA: Diagnosis not present

## 2023-04-11 DIAGNOSIS — Z79899 Other long term (current) drug therapy: Secondary | ICD-10-CM | POA: Diagnosis not present

## 2023-04-11 DIAGNOSIS — Z7982 Long term (current) use of aspirin: Secondary | ICD-10-CM | POA: Diagnosis not present

## 2023-04-11 DIAGNOSIS — I10 Essential (primary) hypertension: Secondary | ICD-10-CM | POA: Diagnosis not present

## 2023-04-11 DIAGNOSIS — R7303 Prediabetes: Secondary | ICD-10-CM | POA: Diagnosis not present

## 2023-04-11 DIAGNOSIS — Z471 Aftercare following joint replacement surgery: Secondary | ICD-10-CM | POA: Diagnosis not present

## 2023-04-11 DIAGNOSIS — M810 Age-related osteoporosis without current pathological fracture: Secondary | ICD-10-CM | POA: Diagnosis not present

## 2023-04-11 DIAGNOSIS — E782 Mixed hyperlipidemia: Secondary | ICD-10-CM | POA: Diagnosis not present

## 2023-04-12 ENCOUNTER — Other Ambulatory Visit: Payer: Medicare Other

## 2023-04-12 DIAGNOSIS — M25561 Pain in right knee: Secondary | ICD-10-CM | POA: Diagnosis not present

## 2023-04-12 DIAGNOSIS — E063 Autoimmune thyroiditis: Secondary | ICD-10-CM | POA: Diagnosis not present

## 2023-04-12 DIAGNOSIS — Z96651 Presence of right artificial knee joint: Secondary | ICD-10-CM | POA: Diagnosis not present

## 2023-04-13 LAB — T4, FREE: Free T4: 1.17 ng/dL (ref 0.82–1.77)

## 2023-04-13 LAB — TSH: TSH: 3.78 u[IU]/mL (ref 0.450–4.500)

## 2023-04-14 DIAGNOSIS — Z96651 Presence of right artificial knee joint: Secondary | ICD-10-CM | POA: Diagnosis not present

## 2023-04-14 DIAGNOSIS — M25561 Pain in right knee: Secondary | ICD-10-CM | POA: Diagnosis not present

## 2023-04-18 DIAGNOSIS — Z96651 Presence of right artificial knee joint: Secondary | ICD-10-CM | POA: Diagnosis not present

## 2023-04-18 DIAGNOSIS — M25561 Pain in right knee: Secondary | ICD-10-CM | POA: Diagnosis not present

## 2023-04-20 ENCOUNTER — Other Ambulatory Visit: Payer: Self-pay | Admitting: Nurse Practitioner

## 2023-04-20 DIAGNOSIS — M25561 Pain in right knee: Secondary | ICD-10-CM | POA: Diagnosis not present

## 2023-04-20 DIAGNOSIS — Z96651 Presence of right artificial knee joint: Secondary | ICD-10-CM | POA: Diagnosis not present

## 2023-04-21 NOTE — Telephone Encounter (Signed)
 Requested Prescriptions  Pending Prescriptions Disp Refills   fluticasone (FLONASE) 50 MCG/ACT nasal spray [Pharmacy Med Name: FLUTICASONE PROPIONATE 50 MCG/ACT N] 16 g 0    Sig: USE 2 SPRAYS INTO EACH NOSTRIL ONCE DAILY     Ear, Nose, and Throat: Nasal Preparations - Corticosteroids Passed - 04/21/2023 10:22 AM      Passed - Valid encounter within last 12 months    Recent Outpatient Visits           1 month ago Primary hypertension   [place] [facility] [place], [place] T, NP   3 months ago Chronic hand pain, left   [place] [facility] [place], Butte T, NP   3 months ago Acute pain of left wrist   [place] [facility] [place], Corrie Dandy T, NP   4 months ago Primary hypertension   [place] [facility] [place], [place] T, NP   7 months ago Hashimoto's thyroiditis   [place] [facility] [place], Dorie Rank, NP       Future Appointments             In 1 month Cannady, Dorie Rank, NP [place] [facility]

## 2023-04-22 DIAGNOSIS — Z96651 Presence of right artificial knee joint: Secondary | ICD-10-CM | POA: Diagnosis not present

## 2023-04-22 DIAGNOSIS — M25561 Pain in right knee: Secondary | ICD-10-CM | POA: Diagnosis not present

## 2023-04-26 DIAGNOSIS — Z96651 Presence of right artificial knee joint: Secondary | ICD-10-CM | POA: Diagnosis not present

## 2023-04-26 DIAGNOSIS — M25561 Pain in right knee: Secondary | ICD-10-CM | POA: Diagnosis not present

## 2023-04-28 DIAGNOSIS — M25561 Pain in right knee: Secondary | ICD-10-CM | POA: Diagnosis not present

## 2023-04-28 DIAGNOSIS — Z96651 Presence of right artificial knee joint: Secondary | ICD-10-CM | POA: Diagnosis not present

## 2023-05-03 DIAGNOSIS — Z96651 Presence of right artificial knee joint: Secondary | ICD-10-CM | POA: Diagnosis not present

## 2023-05-06 DIAGNOSIS — M25561 Pain in right knee: Secondary | ICD-10-CM | POA: Diagnosis not present

## 2023-05-06 DIAGNOSIS — Z96651 Presence of right artificial knee joint: Secondary | ICD-10-CM | POA: Diagnosis not present

## 2023-05-10 DIAGNOSIS — M25561 Pain in right knee: Secondary | ICD-10-CM | POA: Diagnosis not present

## 2023-05-10 DIAGNOSIS — Z96651 Presence of right artificial knee joint: Secondary | ICD-10-CM | POA: Diagnosis not present

## 2023-05-12 DIAGNOSIS — Z96651 Presence of right artificial knee joint: Secondary | ICD-10-CM | POA: Diagnosis not present

## 2023-05-18 DIAGNOSIS — M25561 Pain in right knee: Secondary | ICD-10-CM | POA: Diagnosis not present

## 2023-05-18 DIAGNOSIS — Z96651 Presence of right artificial knee joint: Secondary | ICD-10-CM | POA: Diagnosis not present

## 2023-05-20 DIAGNOSIS — Z96651 Presence of right artificial knee joint: Secondary | ICD-10-CM | POA: Diagnosis not present

## 2023-05-20 DIAGNOSIS — M25561 Pain in right knee: Secondary | ICD-10-CM | POA: Diagnosis not present

## 2023-05-24 DIAGNOSIS — Z96651 Presence of right artificial knee joint: Secondary | ICD-10-CM | POA: Diagnosis not present

## 2023-05-24 DIAGNOSIS — M25561 Pain in right knee: Secondary | ICD-10-CM | POA: Diagnosis not present

## 2023-05-29 NOTE — Patient Instructions (Signed)

## 2023-06-02 ENCOUNTER — Encounter: Payer: Self-pay | Admitting: Nurse Practitioner

## 2023-06-02 ENCOUNTER — Ambulatory Visit: Payer: Self-pay | Admitting: Nurse Practitioner

## 2023-06-02 VITALS — BP 132/68 | HR 89 | Temp 97.6°F | Ht 62.0 in | Wt 160.4 lb

## 2023-06-02 DIAGNOSIS — E782 Mixed hyperlipidemia: Secondary | ICD-10-CM | POA: Diagnosis not present

## 2023-06-02 DIAGNOSIS — M25561 Pain in right knee: Secondary | ICD-10-CM | POA: Diagnosis not present

## 2023-06-02 DIAGNOSIS — R7309 Other abnormal glucose: Secondary | ICD-10-CM | POA: Diagnosis not present

## 2023-06-02 DIAGNOSIS — E063 Autoimmune thyroiditis: Secondary | ICD-10-CM

## 2023-06-02 DIAGNOSIS — I10 Essential (primary) hypertension: Secondary | ICD-10-CM | POA: Diagnosis not present

## 2023-06-02 DIAGNOSIS — Z96651 Presence of right artificial knee joint: Secondary | ICD-10-CM | POA: Diagnosis not present

## 2023-06-02 NOTE — Assessment & Plan Note (Signed)
 Chronic, ongoing.  Continue Levothyroxine daily and adjust dose as needed.  Labs up to date.

## 2023-06-02 NOTE — Assessment & Plan Note (Signed)
 A1c with trend up recent visit to 6%.  Continue diet focus at home.  Recheck next visit and adjust plan of care as needed.

## 2023-06-02 NOTE — Assessment & Plan Note (Signed)
Chronic, ongoing.  Continues every other day statin which has decreased side effects.  Lipid panel today.

## 2023-06-02 NOTE — Progress Notes (Signed)
 BP 132/68 (BP Location: Left Arm, Patient Position: Sitting)   Pulse 89   Temp 97.6 F (36.4 C) (Oral)   Ht 5\' 2"  (1.575 m)   Wt 160 lb 6.4 oz (72.8 kg)   LMP  (LMP Unknown)   SpO2 97%   BMI 29.34 kg/m    Subjective:    Patient ID: Carol [name], female    DOB: April 26, 1950, 73 y.o.   MRN: [id number]  HPI: Carol [name] is a 73 y.o. female  Chief Complaint  Patient presents with   Hyperlipidemia   Hypertension   Hypothyroidism   Prediabetes   HYPERTENSION / HYPERLIPIDEMIA Takes Losartan 25 MG and Atorvastatin.  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 Satisfied with current treatment? yes Duration of hypertension: chronic BP monitoring frequency: not checking BP range: BP medication side effects: no Duration of hyperlipidemia: chronic Cholesterol medication side effects: no Cholesterol supplements: none Medication compliance: good compliance Aspirin: yes Recent stressors: no Recurrent headaches: no Visual changes: no Palpitations: no Dyspnea: no Chest pain: no Lower extremity edema: right knee due to surgery Dizzy/lightheaded: no  HASHIMOTO'S THYROID Taking Levothyroxine 25 MCG daily. Thyroid control status:stable Satisfied with current treatment? yes Medication side effects: no Medication compliance: good compliance Etiology of hypothyroidism: Hashimoto's Recent dose adjustment:no Fatigue: no Cold intolerance: no Heat intolerance: no Weight gain: no Weight loss: no Constipation: no Diarrhea/loose stools: no Palpitations: no Lower extremity edema: no Anxiety/depressed mood: no  Impaired Fasting Glucose HbA1C:  Lab Results  Component Value Date   HGBA1C 6.0 (H) 03/22/2023  Duration of elevated blood sugar: years Polydipsia: no Polyuria: no Weight change: no Visual disturbance: no Glucose Monitoring: no    Accucheck frequency: Not Checking    Fasting glucose:     Post  prandial:  Diabetic Education: Not Completed Family history of diabetes: no      06/02/2023    9:29 AM 03/02/2023    9:49 AM 11/30/2022    8:56 AM 09/07/2022   11:03 AM 02/09/2022   12:02 PM  Depression screen PHQ 2/9  Decreased Interest 0 0 0 0 0  Down, Depressed, Hopeless 0 0 0 0 0  PHQ - 2 Score 0 0 0 0 0  Altered sleeping 0 0 0 0 0  Tired, decreased energy 0 0 0 0 0  Change in appetite 0 0 0 0 0  Feeling bad or failure about yourself  0 0 0 0 0  Trouble concentrating 0 0 0 0 0  Moving slowly or fidgety/restless 0 0 0 0 0  Suicidal thoughts 0 0 0 0 0  PHQ-9 Score 0 0 0 0 0  Difficult doing work/chores Not difficult at all Not difficult at all Not difficult at all Not difficult at all Not difficult at all       06/02/2023    9:29 AM 03/02/2023    9:49 AM 11/30/2022    8:56 AM 02/09/2022   12:02 PM  GAD 7 : Generalized Anxiety Score  Nervous, Anxious, on Edge 0 0 0 0  Control/stop worrying 0 0 0 0  Worry too much - different things 0 0 0 0  Trouble relaxing 0 0 0 0  Restless 0 0 0 0  Easily annoyed or irritable 0 0 0 0  Afraid - awful might happen 0 0 0 0  Total GAD 7 Score 0 0 0 0  Anxiety Difficulty Not difficult at all  Not difficult at all Not difficult at all Not difficult at all   Relevant past medical, surgical, family and social history reviewed and updated as indicated. Interim medical history since our last visit reviewed. Allergies and medications reviewed and updated.  Review of Systems  Constitutional:  Negative for activity change, appetite change, diaphoresis, fatigue and fever.  Respiratory:  Negative for cough, chest tightness and shortness of breath.   Cardiovascular:  Negative for chest pain, palpitations and leg swelling.  Gastrointestinal: Negative.   Neurological: Negative.   Psychiatric/Behavioral: Negative.     Per HPI unless specifically indicated above     Objective:    BP 132/68 (BP Location: Left Arm, Patient Position: Sitting)   Pulse  89   Temp 97.6 F (36.4 C) (Oral)   Ht 5\' 2"  (1.575 m)   Wt 160 lb 6.4 oz (72.8 kg)   LMP  (LMP Unknown)   SpO2 97%   BMI 29.34 kg/m   Wt Readings from Last 3 Encounters:  06/02/23 160 lb 6.4 oz (72.8 kg)  03/28/23 158 lb (71.7 kg)  03/22/23 158 lb (71.7 kg)    Physical Exam Vitals and nursing note reviewed.  Constitutional:      General: She is awake. She is not in acute distress.    Appearance: She is well-developed and overweight. She is not ill-appearing.  HENT:     Head: Normocephalic.     Right Ear: Hearing, tympanic membrane, ear canal and external ear normal.     Left Ear: Hearing, tympanic membrane, ear canal and external ear normal.  Eyes:     General: Lids are normal.        Right eye: No discharge.        Left eye: No discharge.     Conjunctiva/sclera: Conjunctivae normal.     Pupils: Pupils are equal, round, and reactive to light.  Neck:     Thyroid: Thyromegaly present.     Vascular: No carotid bruit.  Cardiovascular:     Rate and Rhythm: Normal rate and regular rhythm.     Heart sounds: Normal heart sounds. No murmur heard.    No gallop.  Pulmonary:     Effort: Pulmonary effort is normal. No accessory muscle usage or respiratory distress.     Breath sounds: Normal breath sounds.  Abdominal:     General: Bowel sounds are normal.     Palpations: Abdomen is soft.  Musculoskeletal:     Cervical back: Normal range of motion and neck supple.     Right lower leg: No edema.     Left lower leg: No edema.  Lymphadenopathy:     Head:     Right side of head: No submental, submandibular, tonsillar, preauricular or posterior auricular adenopathy.     Left side of head: No submental, submandibular, tonsillar, preauricular or posterior auricular adenopathy.     Cervical: No cervical adenopathy.  Skin:    General: Skin is warm and dry.  Neurological:     Mental Status: She is alert and oriented to person, place, and time.  Psychiatric:        Attention and  Perception: Attention normal.        Mood and Affect: Mood normal.        Speech: Speech normal.        Behavior: Behavior normal. Behavior is cooperative.        Thought Content: Thought content normal.    Results for orders placed or performed in visit  on 04/12/23  TSH   Collection Time: 04/12/23 10:14 AM  Result Value Ref Range   TSH 3.780 0.450 - 4.500 uIU/mL  T4, free   Collection Time: 04/12/23 10:14 AM  Result Value Ref Range   Free T4 1.17 0.82 - 1.77 ng/dL      Assessment & Plan:   Problem List Items Addressed This Visit       Cardiovascular and Mediastinum   Hypertension - Primary   Chronic, stable.  BP initial elevated with machine, recheck manually reduced to goal range.  She has some white coat syndrome.  Recommend she monitor at least a few times a week at home.  Continue Losartan 25 MG daily and adjust as needed -- she did not tolerate increase in this dose to 50 MG in past and had dizziness.  Focus on DASH diet at home.  Labs today: up to date. Return to office in 3 months for BP check and reassurance.          Endocrine   Hashimoto's thyroiditis   Chronic, ongoing.  Continue Levothyroxine daily and adjust dose as needed.  Labs up to date.        Other   Hyperlipidemia   Chronic, ongoing.  Continues every other day statin which has decreased side effects.  Lipid panel today.      Elevated hemoglobin A1c   A1c with trend up recent visit to 6%.  Continue diet focus at home.  Recheck next visit and adjust plan of care as needed.        Follow up plan: Return in about 3 months (around 09/01/2023) for HTN/HLD, Hypothyroid, IFG.

## 2023-06-02 NOTE — Assessment & Plan Note (Signed)
 Chronic, stable.  BP initial elevated with machine, recheck manually reduced to goal range.  She has some white coat syndrome.  Recommend she monitor at least a few times a week at home.  Continue Losartan 25 MG daily and adjust as needed -- she did not tolerate increase in this dose to 50 MG in past and had dizziness.  Focus on DASH diet at home.  Labs today: up to date. Return to office in 3 months for BP check and reassurance.

## 2023-06-08 DIAGNOSIS — Z96651 Presence of right artificial knee joint: Secondary | ICD-10-CM | POA: Diagnosis not present

## 2023-06-08 DIAGNOSIS — M25561 Pain in right knee: Secondary | ICD-10-CM | POA: Diagnosis not present

## 2023-06-13 DIAGNOSIS — M25561 Pain in right knee: Secondary | ICD-10-CM | POA: Diagnosis not present

## 2023-06-13 DIAGNOSIS — Z96651 Presence of right artificial knee joint: Secondary | ICD-10-CM | POA: Diagnosis not present

## 2023-06-14 ENCOUNTER — Other Ambulatory Visit: Payer: Self-pay | Admitting: Nurse Practitioner

## 2023-06-16 DIAGNOSIS — Z96651 Presence of right artificial knee joint: Secondary | ICD-10-CM | POA: Diagnosis not present

## 2023-06-16 DIAGNOSIS — M25561 Pain in right knee: Secondary | ICD-10-CM | POA: Diagnosis not present

## 2023-06-17 NOTE — Telephone Encounter (Signed)
 Requested Prescriptions  Pending Prescriptions Disp Refills   fluticasone  (FLONASE ) 50 MCG/ACT nasal spray [Pharmacy Med Name: FLUTICASONE  PROPIONATE 50 MCG/ACT N] 16 g 0    Sig: USE 2 SPRAYS INTO EACH NOSTRIL ONCE DAILY     Ear, Nose, and Throat: Nasal Preparations - Corticosteroids Passed - 06/17/2023 11:14 AM      Passed - Valid encounter within last 12 months    Recent Outpatient Visits           2 weeks ago Primary hypertension   [place] [facility] [place], Lavelle Posey, NP

## 2023-06-21 DIAGNOSIS — Z96651 Presence of right artificial knee joint: Secondary | ICD-10-CM | POA: Diagnosis not present

## 2023-06-21 DIAGNOSIS — M25561 Pain in right knee: Secondary | ICD-10-CM | POA: Diagnosis not present

## 2023-06-28 ENCOUNTER — Other Ambulatory Visit: Payer: Self-pay | Admitting: Nurse Practitioner

## 2023-07-13 ENCOUNTER — Encounter: Payer: Self-pay | Admitting: Nurse Practitioner

## 2023-07-13 ENCOUNTER — Ambulatory Visit (INDEPENDENT_AMBULATORY_CARE_PROVIDER_SITE_OTHER): Admitting: Nurse Practitioner

## 2023-07-13 VITALS — BP 130/78 | HR 87 | Temp 98.2°F | Wt 157.6 lb

## 2023-07-13 DIAGNOSIS — J301 Allergic rhinitis due to pollen: Secondary | ICD-10-CM | POA: Diagnosis not present

## 2023-07-13 MED ORDER — BENZONATATE 100 MG PO CAPS: 100.0000 mg | ORAL_CAPSULE | Freq: Two times a day (BID) | ORAL | 0 refills | Status: DC | PRN

## 2023-07-13 MED ORDER — ALBUTEROL SULFATE HFA 108 (90 BASE) MCG/ACT IN AERS: 2.0000 | INHALATION_SPRAY | Freq: Four times a day (QID) | RESPIRATORY_TRACT | 0 refills | Status: AC | PRN

## 2023-07-13 NOTE — Progress Notes (Signed)
 BP 130/78 (BP Location: Left Arm, Patient Position: Sitting, Cuff Size: Normal)   Pulse 87   Temp 98.2 F (36.8 C) (Oral)   Wt 157 lb 9.6 oz (71.5 kg)   LMP  (LMP Unknown)   SpO2 97%   BMI 28.83 kg/m    Subjective:    Patient ID: Carol [name], female    DOB: 1950/05/11, 73 y.o.   MRN: [id number]  HPI: Carol [name] is a 73 y.o. female  Chief Complaint  Patient presents with   Cough    Patient states she has been having persistent dry cough for the last 2 days. States the cough seems to be triggered when she gets hot. States she has taken Robitussin which helps some but not much per patient.    UPPER RESPIRATORY TRACT INFECTION Has had a dry cough for two days, heat makes it worse.  At night notices this.  Gets similar yearly.  Taking Zyrtec and nasal spray daily.   Fever: no Cough: yes Shortness of breath: no Wheezing: no Chest pain: no Chest tightness: no Chest congestion: no Nasal congestion: yes Runny nose: yes Post nasal drip: yes Sneezing: no Sore throat: no Swollen glands: no Sinus pressure: no Headache: yes occasional Face pain: no Toothache: no Ear pain: none Ear pressure: none Eyes red/itching:no Eye drainage/crusting: no  Vomiting: no Rash: no Fatigue: no Sick contacts: no Strep contacts: no  Context: stable Recurrent sinusitis: no Relief with OTC cold/cough medications: yes helps a little  Treatments attempted: Robitussin    Relevant past medical, surgical, family and social history reviewed and updated as indicated. Interim medical history since our last visit reviewed. Allergies and medications reviewed and updated.  Review of Systems  Constitutional:  Negative for activity change, appetite change, diaphoresis, fatigue and fever.  HENT:  Positive for congestion, postnasal drip and rhinorrhea.   Respiratory:  Positive for cough. Negative for chest tightness, shortness of breath and wheezing.   Cardiovascular:  Negative for chest pain,  palpitations and leg swelling.  Gastrointestinal: Negative.   Neurological: Negative.   Psychiatric/Behavioral: Negative.      Per HPI unless specifically indicated above     Objective:     BP 130/78 (BP Location: Left Arm, Patient Position: Sitting, Cuff Size: Normal)   Pulse 87   Temp 98.2 F (36.8 C) (Oral)   Wt 157 lb 9.6 oz (71.5 kg)   LMP  (LMP Unknown)   SpO2 97%   BMI 28.83 kg/m   Wt Readings from Last 3 Encounters:  07/13/23 157 lb 9.6 oz (71.5 kg)  06/02/23 160 lb 6.4 oz (72.8 kg)  03/28/23 158 lb (71.7 kg)    Physical Exam Vitals and nursing note reviewed.  Constitutional:      General: She is awake. She is not in acute distress.    Appearance: She is well-developed, well-groomed and overweight. She is not ill-appearing.  HENT:     Head: Normocephalic.     Right Ear: Hearing, ear canal and external ear normal. A middle ear effusion is present. There is no impacted cerumen. Tympanic membrane is not injected.     Left Ear: Hearing, ear canal and external ear normal. A middle ear effusion is present. There is no impacted cerumen. Tympanic membrane is not injected.     Nose: Nose normal.     Right Sinus: No maxillary sinus tenderness or frontal sinus tenderness.     Left Sinus: No maxillary sinus tenderness or frontal sinus tenderness.  Mouth/Throat:     Mouth: Mucous membranes are moist.     Pharynx: Posterior oropharyngeal erythema (mild cobblestone pattern) and postnasal drip present. No pharyngeal swelling or oropharyngeal exudate.  Eyes:     General: Lids are normal.        Right eye: No discharge.        Left eye: No discharge.     Conjunctiva/sclera: Conjunctivae normal.     Pupils: Pupils are equal, round, and reactive to light.  Neck:     Thyroid : No thyromegaly.     Vascular: No carotid bruit.  Cardiovascular:     Rate and Rhythm: Normal rate and regular rhythm.     Heart sounds: Normal heart sounds. No murmur heard.    No gallop.  Pulmonary:      Effort: Pulmonary effort is normal. No accessory muscle usage or respiratory distress.     Breath sounds: Normal breath sounds. No decreased breath sounds, wheezing or rales.  Abdominal:     General: Bowel sounds are normal.     Palpations: Abdomen is soft.  Musculoskeletal:     Cervical back: Normal range of motion and neck supple.     Right lower leg: No edema.     Left lower leg: No edema.  Lymphadenopathy:     Head:     Right side of head: No submental, submandibular, tonsillar, preauricular or posterior auricular adenopathy.     Left side of head: No submental, submandibular, tonsillar, preauricular or posterior auricular adenopathy.     Cervical: No cervical adenopathy.  Skin:    General: Skin is warm and dry.  Neurological:     Mental Status: She is alert and oriented to person, place, and time.  Psychiatric:        Attention and Perception: Attention normal.        Mood and Affect: Mood normal.        Speech: Speech normal.        Behavior: Behavior normal. Behavior is cooperative.        Thought Content: Thought content normal.    Results for orders placed or performed in visit on 04/12/23  TSH   Collection Time: 04/12/23 10:14 AM  Result Value Ref Range   TSH 3.780 0.450 - 4.500 uIU/mL  T4, free   Collection Time: 04/12/23 10:14 AM  Result Value Ref Range   Free T4 1.17 0.82 - 1.77 ng/dL      Assessment & Plan:   Problem List Items Addressed This Visit       Respiratory   Allergic rhinitis - Primary   Chronic, ongoing.  Suspect is causing current cough due to drainage.  Continue Zyrtec and Flonase  as ordered.  Tessalon  sent in which has offered her benefit in past.  Albuterol inhaler as needed if any wheezing or SOB with coughing episodes.  If cough continues for one week will consider abx therapy.        Follow up plan: Return for as scheduled July 17th.

## 2023-07-13 NOTE — Assessment & Plan Note (Signed)
 Chronic, ongoing.  Suspect is causing current cough due to drainage.  Continue Zyrtec and Flonase  as ordered.  Tessalon  sent in which has offered her benefit in past.  Albuterol  inhaler as needed if any wheezing or SOB with coughing episodes.  If cough continues for one week will consider abx therapy.

## 2023-07-13 NOTE — Patient Instructions (Signed)

## 2023-07-20 ENCOUNTER — Telehealth: Payer: Self-pay

## 2023-07-20 MED ORDER — AZITHROMYCIN 250 MG PO TABS: ORAL_TABLET | ORAL | 0 refills | Status: AC

## 2023-07-20 NOTE — Addendum Note (Signed)
 Addended by: Jeriel Vivanco T on: 07/20/2023 10:06 AM   Modules accepted: Orders

## 2023-07-20 NOTE — Telephone Encounter (Signed)
 Copied from CRM [id number]. Topic: Clinical - Medication Question >> Jul 20, 2023  8:50 AM Carol [name] S wrote: Reason for CRM: Cough is still not better after visit last week. As discussed, please call in a cough medication to the [facility] because the medication is not working. Please do not prescribe a large pill, small pills or liquid. Callback number is [phone]

## 2023-07-20 NOTE — Telephone Encounter (Signed)
 Called and notified patient that medication has been sent in for her.

## 2023-07-29 ENCOUNTER — Ambulatory Visit: Payer: Self-pay

## 2023-07-29 ENCOUNTER — Telehealth: Payer: Self-pay

## 2023-07-29 MED ORDER — LEVOCETIRIZINE DIHYDROCHLORIDE 5 MG PO TABS: 5.0000 mg | ORAL_TABLET | Freq: Every evening | ORAL | 4 refills | Status: AC

## 2023-07-29 MED ORDER — BENZONATATE 100 MG PO CAPS: 100.0000 mg | ORAL_CAPSULE | Freq: Two times a day (BID) | ORAL | 0 refills | Status: AC | PRN

## 2023-07-29 MED ORDER — AMOXICILLIN-POT CLAVULANATE 875-125 MG PO TABS: 1.0000 | ORAL_TABLET | Freq: Two times a day (BID) | ORAL | 0 refills | Status: DC

## 2023-07-29 NOTE — Telephone Encounter (Signed)
 FYI Only or Action Required?: Action required by provider  Patient was last seen in primary care on 07/13/2023 by Cannady, Jolene T, NP. Called Nurse Triage reporting Cough. Symptoms began several weeks ago. Interventions attempted: OTC medications: Flonase  and Zyrtec. Symptoms are: unchanged.  Triage Disposition: See PCP When Office is Open (Within 3 Days)  Patient/caregiver understands and will follow disposition?: NoCopied from CRM [id number]. Topic: Clinical - Prescription Issue >> Jul 29, 2023  8:25 AM Tiffini S wrote: Reason for CRM: Patient called stating that the medicine for Jolene Cannady NP Zyrtec and Flonase  is not helping with the cough. Patient is asking for a strong medication sent to the pharmacy on file. Reason for Disposition  Cough has been present for > 3 weeks  Answer Assessment - Initial Assessment Questions 1. ONSET: When did the cough begin?      Several weeks  2. SEVERITY: How bad is the cough today?      Just bothersome at night  3. SPUTUM: Describe the color of your sputum (none, dry cough; clear, white, yellow, green)     clear 4. HEMOPTYSIS: Are you coughing up any blood? If so ask: How much? (flecks, streaks, tablespoons, etc.)     denies 5. DIFFICULTY BREATHING: Are you having difficulty breathing? If Yes, ask: How bad is it? (e.g., mild, moderate, severe)    - MILD: No SOB at rest, mild SOB with walking, speaks normally in sentences, can lie down, no retractions, pulse < 100.    - MODERATE: SOB at rest, SOB with minimal exertion and prefers to sit, cannot lie down flat, speaks in phrases, mild retractions, audible wheezing, pulse 100-120.    - SEVERE: Very SOB at rest, speaks in single words, struggling to breathe, sitting hunched forward, retractions, pulse > 120      Denies  6. FEVER: Do you have a fever? If Yes, ask: What is your temperature, how was it measured, and when did it start?     denies 7. CARDIAC HISTORY: Do you have any  history of heart disease? (e.g., heart attack, congestive heart failure)      denies 8. LUNG HISTORY: Do you have any history of lung disease?  (e.g., pulmonary embolus, asthma, emphysema)     denies  10. OTHER SYMPTOMS: Do you have any other symptoms? (e.g., runny nose, wheezing, chest pain)      Denies     Pt called wanting something to stop the coughing at night. Pt said it gets worse when she gets hot. Pt stated  zyrtec and flonase  aren't helping. Pt denies all other symptoms. Pt doesn't want to make an appt at this time. Pt would like medication to be called in. Please advise.  Protocols used: Cough - Acute Non-Productive-A-AH

## 2023-07-29 NOTE — Telephone Encounter (Signed)
 Called and notified patient of Jolene's message.

## 2023-07-29 NOTE — Telephone Encounter (Signed)
 Called and notified patient of antibiotic being sent in.

## 2023-07-29 NOTE — Addendum Note (Signed)
 Addended by: Quantavia Frith T on: 07/29/2023 09:48 AM   Modules accepted: Orders

## 2023-07-29 NOTE — Addendum Note (Signed)
 Addended by: Sadeel Fiddler T on: 07/29/2023 10:21 AM   Modules accepted: Orders

## 2023-07-29 NOTE — Telephone Encounter (Signed)
 Copied from CRM [id number]. Topic: Clinical - Prescription Issue >> Jul 29, 2023 10:13 AM Baldomero Bone wrote: Reason for CRM: Patient states that amoxicillin -clavulanate (AUGMENTIN ) 875-125 MG tablet is too large a pill for her to swallow. Can you send something smaller or liquid in to the pharmacy? Callback number is [phone]

## 2023-08-04 ENCOUNTER — Encounter: Payer: Self-pay | Admitting: Nurse Practitioner

## 2023-08-28 NOTE — Patient Instructions (Signed)

## 2023-08-31 ENCOUNTER — Encounter: Payer: Self-pay | Admitting: Nurse Practitioner

## 2023-08-31 ENCOUNTER — Ambulatory Visit (INDEPENDENT_AMBULATORY_CARE_PROVIDER_SITE_OTHER): Admitting: Nurse Practitioner

## 2023-08-31 VITALS — BP 136/74 | HR 75 | Temp 97.6°F | Ht 62.0 in | Wt 156.4 lb

## 2023-08-31 DIAGNOSIS — R801 Persistent proteinuria, unspecified: Secondary | ICD-10-CM

## 2023-08-31 DIAGNOSIS — E063 Autoimmune thyroiditis: Secondary | ICD-10-CM

## 2023-08-31 DIAGNOSIS — E782 Mixed hyperlipidemia: Secondary | ICD-10-CM | POA: Diagnosis not present

## 2023-08-31 DIAGNOSIS — R7309 Other abnormal glucose: Secondary | ICD-10-CM

## 2023-08-31 DIAGNOSIS — I10 Essential (primary) hypertension: Secondary | ICD-10-CM

## 2023-08-31 MED ORDER — ATORVASTATIN CALCIUM 10 MG PO TABS: 10.0000 mg | ORAL_TABLET | ORAL | 4 refills | Status: AC

## 2023-08-31 NOTE — Assessment & Plan Note (Addendum)
 Chronic, ongoing.  Continue Levothyroxine  daily and adjust dose as needed.  Labs obtained today.

## 2023-08-31 NOTE — Progress Notes (Signed)
 BP 136/74 (BP Location: Left Arm, Patient Position: Sitting, Cuff Size: Normal)   Pulse 75   Temp 97.6 F (36.4 C) (Oral)   Ht 5' 2 (1.575 m)   Wt 156 lb 6.4 oz (70.9 kg)   LMP  (LMP Unknown)   SpO2 97%   BMI 28.61 kg/m    Subjective:    Patient ID: Carol [name], female    DOB: Jun 29, 1950, 73 y.o.   MRN: [id number]  HPI: Carol [name] is a 73 y.o. female  Chief Complaint  Patient presents with   Hyperlipidemia   Hypertension   Hypothyroidism   HYPERTENSION / HYPERLIPIDEMIA & PROTEINURIA Taking Losartan  25 MG (for HTN and proteinuria) and Atorvastatin .   Satisfied with current treatment? yes Duration of hypertension: chronic BP monitoring frequency: not checking BP range: BP medication side effects: no Duration of hyperlipidemia: chronic Cholesterol medication side effects: no Cholesterol supplements: none Medication compliance: good compliance Aspirin : yes Recent stressors: no Recurrent headaches: no Visual changes: no Palpitations: no Dyspnea: no Chest pain: no Lower extremity edema: no Dizzy/lightheaded: occasional in morning when getting out of bed, gets up slowly  Impaired Fasting Glucose HbA1C:  Lab Results  Component Value Date   HGBA1C 6.0 (H) 03/22/2023  Duration of elevated blood sugar: years Polydipsia: no Polyuria: no Weight change: no Visual disturbance: no Glucose Monitoring: no    Accucheck frequency: Not Checking    Fasting glucose:     Post prandial:  Diabetic Education: Not Completed Family history of diabetes: no   HASHIMOTO'S THYROID  Continues Levothyroxine  25 MCG daily. Thyroid  control status:stable Satisfied with current treatment? yes Medication side effects: no Medication compliance: fair compliance Etiology of hypothyroidism: Hashimoto's Recent dose adjustment:no Fatigue: no Cold intolerance: no Heat intolerance: no Weight gain: no Weight loss: no Constipation: no Diarrhea/loose stools: no Palpitations:  no Lower extremity edema: no Anxiety/depressed mood: no      08/31/2023   10:39 AM 06/02/2023    9:29 AM 03/02/2023    9:49 AM 11/30/2022    8:56 AM 09/07/2022   11:03 AM  Depression screen PHQ 2/9  Decreased Interest 0 0 0 0 0  Down, Depressed, Hopeless 0 0 0 0 0  PHQ - 2 Score 0 0 0 0 0  Altered sleeping 0 0 0 0 0  Tired, decreased energy 0 0 0 0 0  Change in appetite 0 0 0 0 0  Feeling bad or failure about yourself  0 0 0 0 0  Trouble concentrating 0 0 0 0 0  Moving slowly or fidgety/restless 0 0 0 0 0  Suicidal thoughts 0 0 0 0 0  PHQ-9 Score 0 0 0 0 0  Difficult doing work/chores Not difficult at all Not difficult at all Not difficult at all Not difficult at all Not difficult at all       08/31/2023   10:39 AM 06/02/2023    9:29 AM 03/02/2023    9:49 AM 11/30/2022    8:56 AM  GAD 7 : Generalized Anxiety Score  Nervous, Anxious, on Edge 0 0 0 0  Control/stop worrying 0 0 0 0  Worry too much - different things 0 0 0 0  Trouble relaxing 0 0 0 0  Restless 0 0 0 0  Easily annoyed or irritable 0 0 0 0  Afraid - awful might happen 0 0 0 0  Total GAD 7 Score 0 0 0 0  Anxiety Difficulty Not difficult at all Not difficult at all  Not difficult at all Not difficult at all    Relevant past medical, surgical, family and social history reviewed and updated as indicated. Interim medical history since our last visit reviewed. Allergies and medications reviewed and updated.  Review of Systems  Constitutional:  Negative for activity change, appetite change, diaphoresis, fatigue and fever.  Respiratory:  Negative for cough, chest tightness and shortness of breath.   Cardiovascular:  Negative for chest pain, palpitations and leg swelling.  Gastrointestinal: Negative.   Neurological: Negative.   Psychiatric/Behavioral: Negative.     Per HPI unless specifically indicated above     Objective:    BP 136/74 (BP Location: Left Arm, Patient Position: Sitting, Cuff Size: Normal)   Pulse  75   Temp 97.6 F (36.4 C) (Oral)   Ht 5' 2 (1.575 m)   Wt 156 lb 6.4 oz (70.9 kg)   LMP  (LMP Unknown)   SpO2 97%   BMI 28.61 kg/m   Wt Readings from Last 3 Encounters:  08/31/23 156 lb 6.4 oz (70.9 kg)  07/13/23 157 lb 9.6 oz (71.5 kg)  06/02/23 160 lb 6.4 oz (72.8 kg)    Physical Exam Vitals and nursing note reviewed.  Constitutional:      General: She is awake. She is not in acute distress.    Appearance: She is well-developed and overweight. She is not [name]-appearing.  HENT:     Head: Normocephalic.     Right Ear: Hearing, tympanic membrane, ear canal and external ear normal.     Left Ear: Hearing, tympanic membrane, ear canal and external ear normal.  Eyes:     General: Lids are normal.        Right eye: No discharge.        Left eye: No discharge.     Conjunctiva/sclera: Conjunctivae normal.     Pupils: Pupils are equal, round, and reactive to light.  Neck:     Thyroid : Thyromegaly present.     Vascular: No carotid bruit.  Cardiovascular:     Rate and Rhythm: Normal rate and regular rhythm.     Heart sounds: Normal heart sounds. No murmur heard.    No gallop.  Pulmonary:     Effort: Pulmonary effort is normal. No accessory muscle usage or respiratory distress.     Breath sounds: Normal breath sounds.  Abdominal:     General: Bowel sounds are normal.     Palpations: Abdomen is soft.  Musculoskeletal:     Cervical back: Normal range of motion and neck supple.     Right lower leg: No edema.     Left lower leg: No edema.  Lymphadenopathy:     Head:     Right side of head: No submental, submandibular, tonsillar, preauricular or posterior auricular adenopathy.     Left side of head: No submental, submandibular, tonsillar, preauricular or posterior auricular adenopathy.     Cervical: No cervical adenopathy.  Skin:    General: Skin is warm and dry.  Neurological:     Mental Status: She is alert and oriented to person, place, and time.  Psychiatric:         Attention and Perception: Attention normal.        Mood and Affect: Mood normal.        Speech: Speech normal.        Behavior: Behavior normal. Behavior is cooperative.        Thought Content: Thought content normal.    Results for orders placed  or performed in visit on 04/12/23  TSH   Collection Time: 04/12/23 10:14 AM  Result Value Ref Range   TSH 3.780 0.450 - 4.500 uIU/mL  T4, free   Collection Time: 04/12/23 10:14 AM  Result Value Ref Range   Free T4 1.17 0.82 - 1.77 ng/dL      Assessment & Plan:   Problem List Items Addressed This Visit       Cardiovascular and Mediastinum   Hypertension - Primary   Chronic, stable.  BP initial elevated with machine, recheck at goal.  She has some white coat syndrome at baseline.  Recommend she monitor at least a few times a week at home.  Continue Losartan  25 MG daily and adjust as needed -- she did not tolerate increase in this dose to 50 MG in past and had dizziness.  Focus on DASH diet at home.  Labs today: CMP. Return to office in 3 months for BP check and reassurance.        Relevant Medications   atorvastatin  (LIPITOR) 10 MG tablet     Endocrine   Hashimoto's thyroiditis   Chronic, ongoing.  Continue Levothyroxine  daily and adjust dose as needed.  Labs obtained today.      Relevant Orders   T4, free   TSH     Other   Proteinuria   Ongoing with stable kidney blood work.  Continue Losartan  for kidney protection, urine ALB 15 May 2022, improved from previous.  Check CMP.  Consider kidney ultrasound in future, she refuses today.      Hyperlipidemia   Chronic, ongoing.  Continues every other day statin which has decreased side effects.  Lipid panel today.      Relevant Medications   atorvastatin  (LIPITOR) 10 MG tablet   Other Relevant Orders   Comprehensive metabolic panel with GFR   Lipid Panel w/o Chol/HDL Ratio   Elevated hemoglobin A1c   A1c with trend up recent visit to 6%.  Continue diet focus at home.  Recheck  next visit and adjust plan of care as needed.        Follow up plan: Return in about 3 months (around 12/01/2023) for HTN/HLD, Hypothyroid, IFG.

## 2023-08-31 NOTE — Assessment & Plan Note (Signed)
 A1c with trend up recent visit to 6%.  Continue diet focus at home.  Recheck next visit and adjust plan of care as needed.

## 2023-08-31 NOTE — Assessment & Plan Note (Signed)
 Chronic, stable.  BP initial elevated with machine, recheck at goal.  She has some white coat syndrome at baseline.  Recommend she monitor at least a few times a week at home.  Continue Losartan  25 MG daily and adjust as needed -- she did not tolerate increase in this dose to 50 MG in past and had dizziness.  Focus on DASH diet at home.  Labs today: CMP. Return to office in 3 months for BP check and reassurance.

## 2023-08-31 NOTE — Assessment & Plan Note (Signed)
 Ongoing with stable kidney blood work.  Continue Losartan  for kidney protection, urine ALB 15 May 2022, improved from previous.  Check CMP.  Consider kidney ultrasound in future, she refuses today.

## 2023-08-31 NOTE — Assessment & Plan Note (Signed)
Chronic, ongoing.  Continues every other day statin which has decreased side effects.  Lipid panel today.

## 2023-09-01 ENCOUNTER — Ambulatory Visit: Payer: Self-pay | Admitting: Nurse Practitioner

## 2023-09-01 ENCOUNTER — Ambulatory Visit: Admitting: Nurse Practitioner

## 2023-09-01 LAB — TSH: TSH: 4.33 u[IU]/mL (ref 0.450–4.500)

## 2023-09-01 LAB — COMPREHENSIVE METABOLIC PANEL WITH GFR
ALT: 10 IU/L (ref 0–32)
AST: 19 IU/L (ref 0–40)
Albumin: 4.2 g/dL (ref 3.8–4.8)
Alkaline Phosphatase: 144 IU/L — ABNORMAL HIGH (ref 44–121)
BUN/Creatinine Ratio: 17 (ref 12–28)
BUN: 14 mg/dL (ref 8–27)
Bilirubin Total: 0.3 mg/dL (ref 0.0–1.2)
CO2: 22 mmol/L (ref 20–29)
Calcium: 9.7 mg/dL (ref 8.7–10.3)
Chloride: 99 mmol/L (ref 96–106)
Creatinine, Ser: 0.81 mg/dL (ref 0.57–1.00)
Globulin, Total: 3.3 g/dL (ref 1.5–4.5)
Glucose: 101 mg/dL — ABNORMAL HIGH (ref 70–99)
Potassium: 4.1 mmol/L (ref 3.5–5.2)
Sodium: 141 mmol/L (ref 134–144)
Total Protein: 7.5 g/dL (ref 6.0–8.5)
eGFR: 77 mL/min/1.73 (ref >59)

## 2023-09-01 LAB — LIPID PANEL W/O CHOL/HDL RATIO
Cholesterol, Total: 210 mg/dL — ABNORMAL HIGH (ref 100–199)
HDL: 44 mg/dL (ref >39)
LDL Chol Calc (NIH): 136 mg/dL — ABNORMAL HIGH (ref 0–99)
Triglycerides: 168 mg/dL — ABNORMAL HIGH (ref 0–149)
VLDL Cholesterol Cal: 30 mg/dL (ref 5–40)

## 2023-09-01 LAB — T4, FREE: Free T4: 0.97 ng/dL (ref 0.82–1.77)

## 2023-09-01 NOTE — Progress Notes (Signed)
 Contacted via MyChart  Good morning Emslee, your labs have returned: - Kidney function, creatinine and eGFR, remains normal, as is liver function, AST and ALT.  - Lipid panel has trended up in levels.  Please ensure you are taking Atorvastatin .  If they remain elevated we could try Rosuvastatin 3 days a week at next visit.  Any questions? .Keep being amazing!!  Thank you for allowing me to participate in your care.  I appreciate you. Kindest regards, Chardae Mulkern

## 2023-09-12 ENCOUNTER — Telehealth: Payer: Self-pay | Admitting: Nurse Practitioner

## 2023-09-12 NOTE — Telephone Encounter (Signed)
 Copied from CRM [id number]. Topic: Medicare AWV >> Sep 12, 2023  1:44 PM Nathanel DEL wrote: Reason for CRM: LVM 09/12/2023 reminder call for AWV appt on Tuesday 09/13/2022    Nathanel Paschal; Care Guide Ambulatory Clinical Support [place] [facility] Direct Dial: [phone]

## 2023-09-13 ENCOUNTER — Ambulatory Visit (INDEPENDENT_AMBULATORY_CARE_PROVIDER_SITE_OTHER): Payer: Self-pay | Admitting: Emergency Medicine

## 2023-09-13 VITALS — Ht 63.0 in | Wt 156.0 lb

## 2023-09-13 DIAGNOSIS — Z0000 Encounter for general adult medical examination without abnormal findings: Secondary | ICD-10-CM

## 2023-09-13 DIAGNOSIS — H9193 Unspecified hearing loss, bilateral: Secondary | ICD-10-CM

## 2023-09-13 NOTE — Progress Notes (Signed)
 Subjective:   Carol [name] is a 73 y.o. who presents for a Medicare Wellness preventive visit.  As a reminder, Annual Wellness Visits don't include a physical exam, and some assessments may be limited, especially if this visit is performed virtually. We may recommend an in-person follow-up visit with your provider if needed.  Visit Complete: Virtual I connected with  Carol [name] on 09/13/23 by a audio enabled telemedicine application and verified that I am speaking with the correct person using two identifiers.  Patient Location: Home  Provider Location: Home Office  I discussed the limitations of evaluation and management by telemedicine. The patient expressed understanding and agreed to proceed.  Vital Signs: Because this visit was a virtual/telehealth visit, some criteria may be missing or patient reported. Any vitals not documented were not able to be obtained and vitals that have been documented are patient reported.  VideoDeclined- This patient declined Librarian, academic. Therefore the visit was completed with audio only.  Persons Participating in Visit: Patient.  AWV Questionnaire: No: Patient Medicare AWV questionnaire was not completed prior to this visit.  Cardiac Risk Factors include: advanced age (>53men, >85 women);dyslipidemia;hypertension     Objective:    Today's Vitals   09/13/23 1118  Weight: 156 lb (70.8 kg)  Height: 5' 3 (1.6 m)   Body mass index is 27.63 kg/m.     09/13/2023   11:34 AM 03/28/2023    9:54 AM 03/22/2023   11:47 AM 09/07/2022   11:05 AM 09/01/2021   11:05 AM 08/25/2020   11:16 AM 08/16/2019    9:51 AM  Advanced Directives  Does Patient Have a Medical Advance Directive? No No No No No No No  Would patient like information on creating a medical advance directive? No - Patient declined No - Patient declined No - Patient declined No - Patient declined No - Patient declined  Yes (MAU/Ambulatory/Procedural Areas  - Information given)    Current Medications (verified) Outpatient Encounter Medications as of 09/13/2023  Medication Sig   albuterol  (VENTOLIN  HFA) 108 (90 Base) MCG/ACT inhaler Inhale 2 puffs into the lungs every 6 (six) hours as needed for wheezing or shortness of breath.   aspirin  EC 81 MG tablet Take 1 tablet (81 mg total) by mouth in the morning and at bedtime. (Patient taking differently: Take 81 mg by mouth in the morning and at bedtime. 1 tablet daily)   atorvastatin  (LIPITOR) 10 MG tablet Take 1 tablet (10 mg total) by mouth every other day. In the evening.   benzonatate  (TESSALON ) 100 MG capsule Take 1 capsule (100 mg total) by mouth 2 (two) times daily as needed for cough.   calcium  carbonate (TUMS - DOSED IN MG ELEMENTAL CALCIUM ) 500 MG chewable tablet Chew 1-2 tablets by mouth 3 (three) times daily as needed for indigestion or heartburn (sour stomach/upset stomach.).   Cyanocobalamin (VITAMIN B-12) 1000 MCG SUBL TAKE 1 TABLET BY MOUTH EVERY DAY *NEW PRESCRIPTION REQUEST*   dextromethorphan -guaiFENesin  (ROBITUSSIN-DM) 10-100 MG/5ML liquid Take 5 mLs by mouth every 4 (four) hours as needed for cough.   fluticasone  (FLONASE ) 50 MCG/ACT nasal spray USE 2 SPRAYS INTO EACH NOSTRIL ONCE DAILY   GNP CALCIUM  500 +D3 500-15 MG-MCG TABS TAKE 1 TABLET BY MOUTH EVERY DAY *NEW PRESCRIPTION REQUEST*   levocetirizine (XYZAL ) 5 MG tablet Take 1 tablet (5 mg total) by mouth every evening.   levothyroxine  (SYNTHROID ) 25 MCG tablet Take 1 tablet (25 mcg total) by mouth daily. Take 30  minutes before other medications and eating in morning.   losartan  (COZAAR ) 50 MG tablet TAKE 1/2 TABLET (25 MG TOTAL) BY MOUTH DAILY   meclizine  (ANTIVERT ) 25 MG tablet Take 1 tablet (25 mg total) by mouth 3 (three) times daily as needed for dizziness.   meloxicam  (MOBIC ) 15 MG tablet Take 15 mg by mouth daily as needed for pain.   Menthol -Methyl Salicylate (ARTHRITIS HOT EX) Apply 1 Application topically 3 (three) times  daily as needed (pain).   Multiple Vitamin (MULTIVITAMIN WITH MINERALS) TABS tablet Take 1 tablet by mouth daily. Centrum Minis Silver Women's Multivitamin for Women   traMADol  (ULTRAM ) 50 MG tablet Take 1-2 tablets (50-100 mg total) by mouth every 4 (four) hours as needed for moderate pain (pain score 4-6). (Patient not taking: Reported on 09/13/2023)   No facility-administered encounter medications on file as of 09/13/2023.    Allergies (verified) Patient has no known allergies.   History: Past Medical History:  Diagnosis Date   Alkaline phosphatase elevation    Allergy    Eczema    GERD (gastroesophageal reflux disease)    Hashimoto's thyroiditis    Heart murmur    Hypertension    Hypothyroidism    Knee pain, right    Low hemoglobin    Mixed hyperlipidemia    Muscle cramps    Osteoarthritis    Osteoporosis    Osteoporosis 12/18/2014   Persistent proteinuria    Pre-diabetes    Past Surgical History:  Procedure Laterality Date   CATARACT EXTRACTION W/PHACO Right 08/16/2019   Procedure: CATARACT EXTRACTION PHACO AND INTRAOCULAR LENS PLACEMENT (IOC) RIGHT VISION BLUE 5.18  00:36.0;  Surgeon: Ferol Rogue, MD;  Location: [facility];  Service: Ophthalmology;  Laterality: Right;   CATARACT EXTRACTION W/PHACO Left 10/04/2019   Procedure: CATARACT EXTRACTION PHACO AND INTRAOCULAR LENS PLACEMENT (IOC) LEFT VISION BLUE;  Surgeon: Ferol Rogue, MD;  Location: [facility];  Service: Ophthalmology;  Laterality: Left;  6.47 0:39.8   EYE SURGERY     JOINT REPLACEMENT Bilateral 2013   Right hip 2013 Left hip 2015   KNEE ARTHROPLASTY Right 03/28/2023   Procedure: COMPUTER ASSISTED TOTAL KNEE ARTHROPLASTY - RNFA;  Surgeon: Mardee Lynwood SQUIBB, MD;  Location: [facility];  Service: Orthopedics;  Laterality: Right;   Family History  Problem Relation Age of Onset   Cancer Mother    CAD Mother    Hypertension Mother    Cancer Father        throat and lung   Hypertension  Brother    Hypertension Daughter    Hypertension Son    Hypertension Brother    Hypertension Brother    Breast cancer Neg Hx    Social History   Socioeconomic History   Marital status: Widowed    Spouse name: Not on file   Number of children: 2   Years of education: 37   Highest education level: 12th grade  Occupational History   Occupation: retired  Tobacco Use   Smoking status: Never   Smokeless tobacco: Never  Vaping Use   Vaping status: Never Used  Substance and Sexual Activity   Alcohol use: No    Alcohol/week: 0.0 standard drinks of alcohol   Drug use: No   Sexual activity: Yes  Other Topics Concern   Not on file  Social History Narrative   Not on file   Social Drivers of Health   Financial Resource Strain: Low Risk  (09/13/2023)   Overall Financial Resource Strain (CARDIA)  Difficulty of Paying Living Expenses: Not very hard  Food Insecurity: No Food Insecurity (09/13/2023)   Hunger Vital Sign    Worried About Running Out of Food in the Last Year: Never true    Ran Out of Food in the Last Year: Never true  Transportation Needs: No Transportation Needs (09/13/2023)   PRAPARE - Administrator, Civil Service (Medical): No    Lack of Transportation (Non-Medical): No  Physical Activity: Insufficiently Active (09/13/2023)   Exercise Vital Sign    Days of Exercise per Week: 7 days    Minutes of Exercise per Session: 20 min  Stress: No Stress Concern Present (09/13/2023)   [employer] of Occupational Health - Occupational Stress Questionnaire    Feeling of Stress: Not at all  Social Connections: Moderately Isolated (09/13/2023)   Social Connection and Isolation Panel    Frequency of Communication with Friends and Family: More than three times a week    Frequency of Social Gatherings with Friends and Family: More than three times a week    Attends Religious Services: More than 4 times per year    Active Member of [employer] or Organizations: No     Attends Banker Meetings: Never    Marital Status: Widowed    Tobacco Counseling Counseling given: Not Answered    Clinical Intake:  Pre-visit preparation completed: Yes  Pain : No/denies pain     BMI - recorded: 27.63 Nutritional Status: BMI 25 -29 Overweight Nutritional Risks: None Diabetes: No  Lab Results  Component Value Date   HGBA1C 6.0 (H) 03/22/2023   HGBA1C 6.0 (H) 11/30/2022   HGBA1C 5.8 (H) 05/11/2022     How often do you need to have someone help you when you read instructions, pamphlets, or other written materials from your doctor or pharmacy?: 1 - Never  Interpreter Needed?: No  Information entered by :: Vina Ned, CMA   Activities of Daily Living     09/13/2023   11:19 AM 03/28/2023    3:30 PM  In your present state of health, do you have any difficulty performing the following activities:  Hearing? 1 0  Comment a little bit, referral to ENT for hearing evaluation   Vision? 0 0  Difficulty concentrating or making decisions? 0 0  Walking or climbing stairs? 0   Dressing or bathing? 0   Doing errands, shopping? 0 0  Preparing Food and eating ? N   Using the Toilet? N   In the past six months, have you accidently leaked urine? N   Do you have problems with loss of bowel control? N   Managing your Medications? N   Managing your Finances? N   Housekeeping or managing your Housekeeping? N     Patient Care Team: Cannady, Jolene T, NP as PCP - General (Nurse Practitioner) Mardee Lynwood SQUIBB, MD (Orthopedic Surgery) Merlynn Lyle CROME, LCSW as Social Worker (Licensed Clinical Social Worker)  I have updated your Care Teams any recent Medical Services you may have received from other providers in the past year.     Assessment:   This is a routine wellness examination for Carol [name].  Hearing/Vision screen Hearing Screening - Comments:: Some hearing loss, referral placed to ENT for evaluation Vision Screening - Comments:: Gets routine  eye exams, [facility], [place]   Goals Addressed             This Visit's Progress    Patient Stated  Maintain health and activity       Depression Screen     09/13/2023   11:31 AM 08/31/2023   10:39 AM 06/02/2023    9:29 AM 03/02/2023    9:49 AM 11/30/2022    8:56 AM 09/07/2022   11:03 AM 08/30/2022   11:07 AM  PHQ 2/9 Scores  PHQ - 2 Score 0 0 0 0 0 0   PHQ- 9 Score 0 0 0 0 0 0   Exception Documentation       Patient refusal    Fall Risk     09/13/2023   11:35 AM 08/31/2023   10:39 AM 06/02/2023    9:27 AM 03/02/2023    9:49 AM 11/30/2022    8:56 AM  Fall Risk   Falls in the past year? 0 0 0 0 0  Number falls in past yr: 0 0 0 0 0  Injury with Fall? 0 0 0 0 0  Risk for fall due to : No Fall Risks No Fall Risks No Fall Risks No Fall Risks No Fall Risks  Follow up Falls evaluation completed Falls evaluation completed Falls evaluation completed Falls evaluation completed Falls evaluation completed    MEDICARE RISK AT HOME:  Medicare Risk at Home Any stairs in or around the home?: Yes If so, are there any without handrails?: No Home free of loose throw rugs in walkways, pet beds, electrical cords, etc?: Yes Adequate lighting in your home to reduce risk of falls?: Yes Life alert?: No Use of a cane, walker or w/c?: No Grab bars in the bathroom?: No Shower chair or bench in shower?: No Elevated toilet seat or a handicapped toilet?: Yes  TIMED UP AND GO:  Was the test performed?  No  Cognitive Function: 6CIT completed        09/13/2023   11:37 AM 09/07/2022   11:09 AM 09/01/2021   11:07 AM 08/25/2020   11:19 AM 08/13/2019   11:22 AM  6CIT Screen  What Year? 0 points 0 points 0 points 0 points 0 points  What month? 0 points 0 points 0 points 0 points 0 points  What time? 0 points 0 points 0 points 0 points 0 points  Count back from 20 0 points 0 points 0 points 0 points 0 points  Months in reverse 0 points 0 points 0 points 0 points 4 points   Repeat phrase 0 points 0 points 0 points 0 points 0 points  Total Score 0 points 0 points 0 points 0 points 4 points    Immunizations Immunization History  Administered Date(s) Administered   Fluad Quad(high Dose 65+) 01/02/2019, 11/27/2019, 02/03/2021, 11/09/2021   Fluad Trivalent(High Dose 65+) 11/30/2022   Influenza, High Dose Seasonal PF 12/21/2017   Influenza,inj,Quad PF,6+ Mos 01/27/2015, 02/13/2016   Janssen (J&J) SARS-COV-2 Vaccination 05/04/2019   Td 11/17/2004    Screening Tests Health Maintenance  Topic Date Due   Zoster Vaccines- Shingrix (1 of 2) Never done   COVID-19 Vaccine (2 - Janssen risk series) 06/01/2019   DTaP/Tdap/Td (2 - Tdap) 11/30/2023 (Originally 11/18/2014)   Pneumococcal Vaccine: 50+ Years (1 of 1 - PCV) 06/01/2024 (Originally 05/24/2001)   INFLUENZA VACCINE  09/16/2023   MAMMOGRAM  02/28/2024   COLON CANCER SCREENING ANNUAL FOBT  03/13/2024   Medicare Annual Wellness (AWV)  09/12/2024   DEXA SCAN  05/01/2028   Hepatitis C Screening  Completed   Hepatitis B Vaccines  Aged Out   HPV VACCINES  Aged Out  Meningococcal B Vaccine  Aged Out   Colonoscopy  Discontinued    Health Maintenance  Health Maintenance Due  Topic Date Due   Zoster Vaccines- Shingrix (1 of 2) Never done   COVID-19 Vaccine (2 - Janssen risk series) 06/01/2019   Health Maintenance Items Addressed: See Nurse Notes at the end of this note  Additional Screening:  Vision Screening: Recommended annual ophthalmology exams for early detection of glaucoma and other disorders of the eye. Would you like a referral to an eye doctor? No    Dental Screening: Recommended annual dental exams for proper oral hygiene  Community Resource Referral / Chronic Care Management: CRR required this visit?  No   CCM required this visit?  No   Plan:    I have personally reviewed and noted the following in the patient's chart:   Medical and social history Use of alcohol, tobacco or illicit  drugs  Current medications and supplements including opioid prescriptions. Patient is not currently taking opioid prescriptions. Functional ability and status Nutritional status Physical activity Advanced directives List of other physicians Hospitalizations, surgeries, and ER visits in previous 12 months Vitals Screenings to include cognitive, depression, and falls Referrals and appointments  In addition, I have reviewed and discussed with patient certain preventive protocols, quality metrics, and best practice recommendations. A written personalized care plan for preventive services as well as general preventive health recommendations were provided to patient.   Vina Ned, CMA   09/13/2023   After Visit Summary: (MyChart) Due to this being a telephonic visit, the after visit summary with patients personalized plan was offered to patient via MyChart   Notes:  Placed referral to [facility] for hearing loss Declined covid, Shingles and pneumonia vaccines

## 2023-09-13 NOTE — Patient Instructions (Signed)
 Ms. Carol [name] , Thank you for taking time out of your busy schedule to complete your Annual Wellness Visit with me. I enjoyed our conversation and look forward to speaking with you again next year. I, as well as your care team,  appreciate your ongoing commitment to your health goals. Please review the following plan we discussed and let me know if I can assist you in the future. Your Game plan/ To Do List    Referrals: If you haven't heard from the office you've been referred to, please reach out to them at the phone provided.  I have placed a referral to [facility] to have your hearing evaluated. Phone # [phone]  Follow up Visits: Next Medicare AWV with our clinical staff: 09/18/24 @ 11:20am (PHONE VISIT)   Have you seen your provider in the last 6 months (3 months if uncontrolled diabetes)? Yes Next Office Visit with your provider: 12/02/23 @ 10:20am with Jolene Cannady, NP  Clinician Recommendations:  Aim for 30 minutes of exercise or brisk walking, 6-8 glasses of water, and 5 servings of fruits and vegetables each day.       This is a list of the screening recommended for you and due dates:  Health Maintenance  Topic Date Due   Zoster (Shingles) Vaccine (1 of 2) Never done   COVID-19 Vaccine (2 - Janssen risk series) 06/01/2019   DTaP/Tdap/Td vaccine (2 - Tdap) 11/30/2023*   Pneumococcal Vaccine for age over 43 (1 of 1 - PCV) 06/01/2024*   Flu Shot  09/16/2023   Mammogram  02/28/2024   Stool Blood Test  03/13/2024   Medicare Annual Wellness Visit  09/12/2024   DEXA scan (bone density measurement)  05/01/2028   Hepatitis C Screening  Completed   Hepatitis B Vaccine  Aged Out   HPV Vaccine  Aged Out   Meningitis B Vaccine  Aged Out   Colon Cancer Screening  Discontinued  *Topic was postponed. The date shown is not the original due date.    Advanced directives: (Declined) Advance directive discussed with you today. Even though you declined this today, please call our office  should you change your mind, and we can give you the proper paperwork for you to fill out. Advance Care Planning is important because it:  [x]  Makes sure you receive the medical care that is consistent with your values, goals, and preferences  [x]  It provides guidance to your family and loved ones and reduces their decisional burden about whether or not they are making the right decisions based on your wishes.  Follow the link provided in your after visit summary or read over the paperwork we have mailed to you to help you started getting your Advance Directives in place. If you need assistance in completing these, please reach out to us  so that we can help you!  See attachments for Preventive Care and Fall Prevention Tips.   Fall Prevention in the Home, Adult Falls can cause injuries and affect people of all ages. There are many simple things that you can do to make your home safe and to help prevent falls. If you need it, ask for help making these changes. What actions can I take to prevent falls? General information Use good lighting in all rooms. Make sure to: Replace any light bulbs that burn out. Turn on lights if it is dark and use night-lights. Keep items that you use often in easy-to-reach places. Lower the shelves around your home if needed. Move furniture so  that there are clear paths around it. Do not keep throw rugs or other things on the floor that can make you trip. If any of your floors are uneven, fix them. Add color or contrast paint or tape to clearly mark and help you see: Grab bars or handrails. First and last steps of staircases. Where the edge of each step is. If you use a ladder or stepladder: Make sure that it is fully opened. Do not climb a closed ladder. Make sure the sides of the ladder are locked in place. Have someone hold the ladder while you use it. Know where your pets are as you move through your home. What can I do in the bathroom?     Keep the  floor dry. Clean up any water that is on the floor right away. Remove soap buildup in the bathtub or shower. Buildup makes bathtubs and showers slippery. Use non-skid mats or decals on the floor of the bathtub or shower. Attach bath mats securely with double-sided, non-slip rug tape. If you need to sit down while you are in the shower, use a non-slip stool. Install grab bars by the toilet and in the bathtub and shower. Do not use towel bars as grab bars. What can I do in the bedroom? Make sure that you have a light by your bed that is easy to reach. Do not use any sheets or blankets on your bed that hang to the floor. Have a firm bench or chair with side arms that you can use for support when you get dressed. What can I do in the kitchen? Clean up any spills right away. If you need to reach something above you, use a sturdy step stool that has a grab bar. Keep electrical cables out of the way. Do not use floor polish or wax that makes floors slippery. What can I do with my stairs? Do not leave anything on the stairs. Make sure that you have a light switch at the top and the bottom of the stairs. Have them installed if you do not have them. Make sure that there are handrails on both sides of the stairs. Fix handrails that are broken or loose. Make sure that handrails are as long as the staircases. Install non-slip stair treads on all stairs in your home if they do not have carpet. Avoid having throw rugs at the top or bottom of stairs, or secure the rugs with carpet tape to prevent them from moving. Choose a carpet design that does not hide the edge of steps on the stairs. Make sure that carpet is firmly attached to the stairs. Fix any carpet that is loose or worn. What can I do on the outside of my home? Use bright outdoor lighting. Repair the edges of walkways and driveways and fix any cracks. Clear paths of anything that can make you trip, such as tools or rocks. Add color or contrast  paint or tape to clearly mark and help you see high doorway thresholds. Trim any bushes or trees on the main path into your home. Check that handrails are securely fastened and in good repair. Both sides of all steps should have handrails. Install guardrails along the edges of any raised decks or porches. Have leaves, snow, and ice cleared regularly. Use sand, salt, or ice melt on walkways during winter months if you live where there is ice and snow. In the garage, clean up any spills right away, including grease or oil spills. What other  actions can I take? Review your medicines with your health care provider. Some medicines can make you confused or feel dizzy. This can increase your chance of falling. Wear closed-toe shoes that fit well and support your feet. Wear shoes that have rubber soles and low heels. Use a cane, walker, scooter, or crutches that help you move around if needed. Talk with your provider about other ways that you can decrease your risk of falls. This may include seeing a physical therapist to learn to do exercises to improve movement and strength. Where to find more information Centers for Disease Control and Prevention, STEADI: TonerPromos.no [employer] on Aging: BaseRingTones.pl National Institute on Aging: BaseRingTones.pl Contact a health care provider if: You are afraid of falling at home. You feel weak, drowsy, or dizzy at home. You fall at home. Get help right away if you: Lose consciousness or have trouble moving after a fall. Have a fall that causes a head injury. These symptoms may be an emergency. Get help right away. Call 911. Do not wait to see if the symptoms will go away. Do not drive yourself to the hospital. This information is not intended to replace advice given to you by your health care provider. Make sure you discuss any questions you have with your health care provider. Document Revised: 10/05/2021 Document Reviewed: 10/05/2021 Elsevier Patient Education   2024 [employer].

## 2023-09-22 DIAGNOSIS — Z96651 Presence of right artificial knee joint: Secondary | ICD-10-CM | POA: Diagnosis not present

## 2023-09-22 DIAGNOSIS — M1711 Unilateral primary osteoarthritis, right knee: Secondary | ICD-10-CM | POA: Diagnosis not present

## 2023-09-26 ENCOUNTER — Other Ambulatory Visit: Payer: Self-pay | Admitting: Nurse Practitioner

## 2023-09-28 ENCOUNTER — Other Ambulatory Visit: Payer: Self-pay | Admitting: Nurse Practitioner

## 2023-09-28 NOTE — Telephone Encounter (Signed)
 Copied from CRM [id number]. Topic: Clinical - Medication Refill >> Sep 28, 2023  9:58 AM Tiffini S wrote: Medication: fluticasone  (FLONASE ) 50 MCG/ACT nasal spray  Has the patient contacted their pharmacy? Yes, request was faxed, no response   (Agent: If no, request that the patient contact the pharmacy for the refill. If patient does not wish to contact the pharmacy document the reason why and proceed with request.) (Agent: If yes, when and what did the pharmacy advise?)  This is the patient's preferred pharmacy:  [facility] - [place], [place] - [address] [place] [address] Phone: [phone] Fax: [phone]  Is this the correct pharmacy for this prescription? Yes If no, delete pharmacy and type the correct one.   Has the prescription been filled recently? Yes  Is the patient out of the medication? Yes, will have enough for tomorrow morning   Has the patient been seen for an appointment in the last year OR does the patient have an upcoming appointment? Yes  Can we respond through MyChart? No, please call at [phone]  Agent: Please be advised that Rx refills may take up to 3 business days. We ask that you follow-up with your pharmacy.

## 2023-09-29 ENCOUNTER — Telehealth: Payer: Self-pay

## 2023-09-29 NOTE — Telephone Encounter (Signed)
 Requested Prescriptions  Pending Prescriptions Disp Refills   fluticasone  (FLONASE ) 50 MCG/ACT nasal spray [Pharmacy Med Name: FLUTICASONE  PROPIONATE 50 MCG/ACT N] 16 g 2    Sig: USE 2 SPRAYS INTO EACH NOSTRIL ONCE DAILY     Ear, Nose, and Throat: Nasal Preparations - Corticosteroids Passed - 09/29/2023 11:13 AM      Passed - Valid encounter within last 12 months    Recent Outpatient Visits           4 weeks ago Primary hypertension   [facility] [place], [place] T, NP   2 months ago Non-seasonal allergic rhinitis due to pollen   [facility] [place], Melanie T, NP   3 months ago Primary hypertension   [facility] [place], Melanie DASEN, NP

## 2023-09-29 NOTE — Telephone Encounter (Signed)
 Prescription sent in this morning. Called and notified patient that this was done for her.

## 2023-09-29 NOTE — Telephone Encounter (Signed)
 Copied from CRM [id number]. Topic: General - Other >> Sep 29, 2023 10:44 AM [name] wrote: Reason for CRM: Patient is calling in because her pharmacy has sent a request and patient has also requested fluticasone  (FLONASE ) 50 MCG/ACT nasal spray [[id number]] but has not heard anything back. Patient wants to know why the prescription isn't being filled. Please advise.

## 2023-09-30 NOTE — Telephone Encounter (Signed)
 Duplicate request, LRF 09/29/23.  Requested Prescriptions  Pending Prescriptions Disp Refills   fluticasone  (FLONASE ) 50 MCG/ACT nasal spray 16 g 0    Sig: USE 2 SPRAYS INTO EACH NOSTRIL ONCE DAILY     Ear, Nose, and Throat: Nasal Preparations - Corticosteroids Passed - 09/30/2023  2:04 PM      Passed - Valid encounter within last 12 months    Recent Outpatient Visits           1 month ago Primary hypertension   [facility] [place], [place] T, NP   2 months ago Non-seasonal allergic rhinitis due to pollen   [facility] [place], Melanie T, NP   4 months ago Primary hypertension   [facility] [place], Melanie DASEN, NP

## 2023-10-24 ENCOUNTER — Other Ambulatory Visit: Payer: Self-pay | Admitting: Nurse Practitioner

## 2023-10-25 NOTE — Telephone Encounter (Signed)
 Requested Prescriptions  Pending Prescriptions Disp Refills   levothyroxine  (SYNTHROID ) 25 MCG tablet [Pharmacy Med Name: LEVOTHYROXINE  SODIUM 25 MCG TAB] 180 tablet 1    Sig: TAKE 1 TABLET BY MOUTH DAILY. TAKE 30 MINUTES BEFORE OTHER MEDICATION AND FOODIN MORNING.     Endocrinology:  Hypothyroid Agents Passed - 10/25/2023  2:18 PM      Passed - TSH in normal range and within 360 days    TSH  Date Value Ref Range Status  08/31/2023 4.330 0.450 - 4.500 uIU/mL Final         Passed - Valid encounter within last 12 months    Recent Outpatient Visits           1 month ago Primary hypertension   [facility] [place], [place] T, NP   3 months ago Non-seasonal allergic rhinitis due to pollen   [facility] [place], Melanie T, NP   4 months ago Primary hypertension   [place] [facility] [place], Melanie DASEN, NP

## 2023-11-27 NOTE — Patient Instructions (Signed)
 Prediabetes: What to Know Prediabetes is when your blood sugar, also called glucose, is at a higher level than normal but not high enough for you to be diagnosed with type 2 diabetes (type 2 diabetes mellitus). Having prediabetes puts you at risk for getting type 2 diabetes. By making some healthy changes, you may be able to prevent or delay getting type 2 diabetes. This is important because type 2 diabetes can lead to serious problems. Some of these include: Heart disease. Stroke. Blindness. Kidney disease. Depression. Poor blood flow in the feet and legs. In very bad cases, this could lead to having a leg removed by surgery (amputation). What are the causes? The exact cause of prediabetes isn't known. It may result from insulin resistance. Insulin resistance happens when cells in the body don't respond properly to insulin that the body makes. This can cause too much sugar to build up in the blood. High blood sugar, also called hyperglycemia, can develop. What increases the risk? Having a family member with type 2 diabetes. Being older than 72 years of age. Having had a temporary form of diabetes during a pregnancy. This is called gestational diabetes. Having had polycystic ovary syndrome (PCOS). Being overweight or obese. Being inactive and not getting much exercise. Having a history of heart disease. This may include problems with cholesterol levels, high levels of blood fats, or high blood pressure. What are the signs or symptoms? You may have no symptoms. If you do have symptoms, they may include: Increased hunger. Increased thirst. Needing to pee more often. Changes in how you see, like blurry vision. Feeling tired. How is this diagnosed? Prediabetes can be diagnosed with blood tests that check your blood sugar. One or more of these tests may be done: A fasting blood glucose (FBG) test. You won't be allowed to eat (you will fast) for at least 8 hours before a blood sample is  taken. An A1C blood test, also called a hemoglobin A1C test. This test shows information about blood sugar levels over the past 2?3 months. An oral glucose tolerance test (OGTT). This test measures your blood sugar at two points in time: After you haven't eaten for a while. This is your baseline level. Two hours after you drink a beverage that has sugar in it. You may be diagnosed with prediabetes if: Your FBG is 100?125 mg/dL (1.6-1.0 mmol/L). Your A1C level is 5.7?6.4% (39-46 mmol/mol). Your OGTT result is 140?199 mg/dL (9.6-04 mmol/L). These blood tests may need to be done again to be sure of the diagnosis. How is this treated? Treatment may include making changes to your diet and lifestyle. These changes can help lower your blood sugar and keep you from getting type 2 diabetes. In some cases, medicine may be given to help lower your risk. Follow these instructions at home: Eating and drinking  Eat and drink as told. Follow a healthy meal plan. This includes eating lean proteins, whole grains, legumes, fresh fruits and vegetables, low-fat dairy products, and healthy fats. Meet with an expert in healthy eating called a dietitian. This person can help create a healthy eating plan that's right for you. Lifestyle Do moderate-intensity exercise. Do this for at least 30 minutes a day on 5 or more days each week, or as told by your health care provider. A mix of activities may be best. Good choices include brisk walking, swimming, biking, and weight lifting. Try to lose weight if your provider says it's OK. Losing 5-7% of your body weight can  help reverse insulin resistance. Do not drink alcohol if: Your provider tells you not to drink. You're pregnant, may be pregnant, or plan to become pregnant. If you drink alcohol: Limit how much you have to: 0-1 drink a day if you're female. 0-2 drinks a day if you're female. Know how much alcohol is in your drink. In the [place] one drink is one 12 oz  bottle of beer (355 mL), one 5 oz glass of wine (148 mL), or one 1 oz glass of hard liquor (44 mL). General instructions Take medicines only as told. You may be given medicines that help lower the risk of type 2 diabetes. Do not smoke, vape, or use nicotine or tobacco. Where to find more information American Diabetes Association: diabetes.org/about-diabetes/prediabetes Academy of Nutrition and Dietetics: eatright.org American Heart Association: Go to ThisJobs.cz. Click the search icon. Type "prediabetes" in the search box. Contact a health care provider if: You have any of these symptoms: Increased hunger. Peeing more often than usual. Increased thirst. Feeling tired. Changes in how you see, like blurry vision. Feeling like you may throw up. Throwing up. Get help right away if: You have shortness of breath. You feel confused. This information is not intended to replace advice given to you by your health care provider. Make sure you discuss any questions you have with your health care provider. Document Revised: 09/05/2022 Document Reviewed: 09/05/2022 Elsevier Patient Education  2024 [employer].

## 2023-12-02 ENCOUNTER — Encounter: Payer: Self-pay | Admitting: Nurse Practitioner

## 2023-12-02 ENCOUNTER — Ambulatory Visit (INDEPENDENT_AMBULATORY_CARE_PROVIDER_SITE_OTHER): Admitting: Nurse Practitioner

## 2023-12-02 VITALS — BP 116/78 | HR 78 | Temp 98.2°F | Ht 62.0 in | Wt 158.2 lb

## 2023-12-02 DIAGNOSIS — Z23 Encounter for immunization: Secondary | ICD-10-CM

## 2023-12-02 DIAGNOSIS — I10 Essential (primary) hypertension: Secondary | ICD-10-CM

## 2023-12-02 DIAGNOSIS — E782 Mixed hyperlipidemia: Secondary | ICD-10-CM | POA: Diagnosis not present

## 2023-12-02 DIAGNOSIS — R7309 Other abnormal glucose: Secondary | ICD-10-CM

## 2023-12-02 DIAGNOSIS — E063 Autoimmune thyroiditis: Secondary | ICD-10-CM | POA: Diagnosis not present

## 2023-12-02 LAB — BAYER DCA HB A1C WAIVED: HB A1C (BAYER DCA - WAIVED): 5.6 % (ref 4.8–5.6)

## 2023-12-02 NOTE — Assessment & Plan Note (Signed)
 Chronic, stable.  BP initial elevated with machine, recheck at goal.  She has some white coat syndrome at baseline.  Recommend she monitor at least a few times a week at home.  Continue Losartan  25 MG daily and adjust as needed -- she did not tolerate increase in this dose to 50 MG in past, had dizziness.  Focus on DASH diet at home.  Labs today: up to date. Return to office in 3 months for BP check and reassurance.

## 2023-12-02 NOTE — Assessment & Plan Note (Signed)
Chronic, ongoing.  Continues every other day statin which has decreased side effects.  Lipid panel next visit.

## 2023-12-02 NOTE — Assessment & Plan Note (Signed)
 A1c 5.6% today, trend down.  Continue diet focus at home.

## 2023-12-02 NOTE — Assessment & Plan Note (Signed)
 Chronic, ongoing.  Continue Levothyroxine daily and adjust dose as needed.  Labs up to date.

## 2023-12-02 NOTE — Progress Notes (Signed)
 BP 116/78 (BP Location: Left Arm, Patient Position: Sitting)   Pulse 78   Temp 98.2 F (36.8 C) (Oral)   Ht 5' 2 (1.575 m)   Wt 158 lb 3.2 oz (71.8 kg)   LMP  (LMP Unknown)   SpO2 98%   BMI 28.94 kg/m    Subjective:    Patient ID: Carol [name], female    DOB: 12/05/1950, 73 y.o.   MRN: [id number]  HPI: Carol [name] is a 73 y.o. female  Chief Complaint  Patient presents with   Hyperlipidemia   Hypertension   Hypothyroidism   HYPERTENSION / HYPERLIPIDEMIA & PROTEINURIA Continues Losartan  25 MG (for HTN and proteinuria) and Atorvastatin .   Satisfied with current treatment? yes Duration of hypertension: chronic BP monitoring frequency: not checking BP range: BP medication side effects: no Duration of hyperlipidemia: chronic Cholesterol medication side effects: no Cholesterol supplements: none Medication compliance: good compliance Aspirin : yes Recent stressors: no Recurrent headaches: no Visual changes: no Palpitations: no Dyspnea: no Chest pain: no Lower extremity edema: no Dizzy/lightheaded: no  Impaired Fasting Glucose HbA1C:  Lab Results  Component Value Date   HGBA1C 6.0 (H) 03/22/2023  Duration of elevated blood sugar: years Polydipsia: no Polyuria: no Weight change: no Visual disturbance: no Glucose Monitoring: no    Accucheck frequency: Not Checking    Fasting glucose:     Post prandial:  Diabetic Education: Not Completed Family history of diabetes: no   HASHIMOTO'S THYROID  Takes Levothyroxine  25 MCG daily. Thyroid  control status:stable Satisfied with current treatment? yes Medication side effects: no Medication compliance: fair compliance Etiology of hypothyroidism: Hashimoto's Recent dose adjustment:no Fatigue: no Cold intolerance: no Heat intolerance: no Weight gain: no Weight loss: no Constipation: no Diarrhea/loose stools: no Palpitations: no Lower extremity edema: no Anxiety/depressed mood: no      12/02/2023   10:24  AM 09/13/2023   11:31 AM 08/31/2023   10:39 AM 06/02/2023    9:29 AM 03/02/2023    9:49 AM  Depression screen PHQ 2/9  Decreased Interest 0 0 0 0 0  Down, Depressed, Hopeless 0 0 0 0 0  PHQ - 2 Score 0 0 0 0 0  Altered sleeping 0 0 0 0 0  Tired, decreased energy 0 0 0 0 0  Change in appetite 0 0 0 0 0  Feeling bad or failure about yourself  0 0 0 0 0  Trouble concentrating 0 0 0 0 0  Moving slowly or fidgety/restless 0 0 0 0 0  Suicidal thoughts 0 0 0 0 0  PHQ-9 Score 0 0 0 0 0  Difficult doing work/chores Not difficult at all Not difficult at all Not difficult at all Not difficult at all Not difficult at all       12/02/2023   10:24 AM 08/31/2023   10:39 AM 06/02/2023    9:29 AM 03/02/2023    9:49 AM  GAD 7 : Generalized Anxiety Score  Nervous, Anxious, on Edge 0 0 0 0  Control/stop worrying 0 0 0 0  Worry too much - different things 0 0 0 0  Trouble relaxing 0 0 0 0  Restless 0 0 0 0  Easily annoyed or irritable 0 0 0 0  Afraid - awful might happen 0 0 0 0  Total GAD 7 Score 0 0 0 0  Anxiety Difficulty Not difficult at all Not difficult at all Not difficult at all Not difficult at all    Relevant past medical, surgical,  family and social history reviewed and updated as indicated. Interim medical history since our last visit reviewed. Allergies and medications reviewed and updated.  Review of Systems  Constitutional:  Negative for activity change, appetite change, diaphoresis, fatigue and fever.  Respiratory:  Negative for cough, chest tightness and shortness of breath.   Cardiovascular:  Negative for chest pain, palpitations and leg swelling.  Gastrointestinal: Negative.   Neurological: Negative.   Psychiatric/Behavioral: Negative.     Per HPI unless specifically indicated above     Objective:    BP 116/78 (BP Location: Left Arm, Patient Position: Sitting)   Pulse 78   Temp 98.2 F (36.8 C) (Oral)   Ht 5' 2 (1.575 m)   Wt 158 lb 3.2 oz (71.8 kg)   LMP  (LMP  Unknown)   SpO2 98%   BMI 28.94 kg/m   Wt Readings from Last 3 Encounters:  12/02/23 158 lb 3.2 oz (71.8 kg)  09/13/23 156 lb (70.8 kg)  08/31/23 156 lb 6.4 oz (70.9 kg)    Physical Exam Vitals and nursing note reviewed.  Constitutional:      General: She is awake. She is not in acute distress.    Appearance: She is well-developed and overweight. She is not [name]-appearing.  HENT:     Head: Normocephalic.     Right Ear: Hearing, tympanic membrane, ear canal and external ear normal.     Left Ear: Hearing, tympanic membrane, ear canal and external ear normal.  Eyes:     General: Lids are normal.        Right eye: No discharge.        Left eye: No discharge.     Conjunctiva/sclera: Conjunctivae normal.     Pupils: Pupils are equal, round, and reactive to light.  Neck:     Thyroid : Thyromegaly present.     Vascular: No carotid bruit.  Cardiovascular:     Rate and Rhythm: Normal rate and regular rhythm.     Heart sounds: Normal heart sounds. No murmur heard.    No gallop.     Comments: A couple extra beats noted during auscultation, she was noted to have PACs on past EKGs. Pulmonary:     Effort: Pulmonary effort is normal. No accessory muscle usage or respiratory distress.     Breath sounds: Normal breath sounds.  Abdominal:     General: Bowel sounds are normal.     Palpations: Abdomen is soft.  Musculoskeletal:     Cervical back: Normal range of motion and neck supple.     Right lower leg: No edema.     Left lower leg: No edema.  Lymphadenopathy:     Head:     Right side of head: No submental, submandibular, tonsillar, preauricular or posterior auricular adenopathy.     Left side of head: No submental, submandibular, tonsillar, preauricular or posterior auricular adenopathy.     Cervical: No cervical adenopathy.  Skin:    General: Skin is warm and dry.  Neurological:     Mental Status: She is alert and oriented to person, place, and time.  Psychiatric:        Attention  and Perception: Attention normal.        Mood and Affect: Mood normal.        Speech: Speech normal.        Behavior: Behavior normal. Behavior is cooperative.        Thought Content: Thought content normal.    Results for orders placed  or performed in visit on 08/31/23  T4, free   Collection Time: 08/31/23 10:40 AM  Result Value Ref Range   Free T4 0.97 0.82 - 1.77 ng/dL  TSH   Collection Time: 08/31/23 10:40 AM  Result Value Ref Range   TSH 4.330 0.450 - 4.500 uIU/mL  Comprehensive metabolic panel with GFR   Collection Time: 08/31/23 10:40 AM  Result Value Ref Range   Glucose 101 (H) 70 - 99 mg/dL   BUN 14 8 - 27 mg/dL   Creatinine, Ser 9.18 0.57 - 1.00 mg/dL   eGFR 77 >40 fO/fpw/8.26   BUN/Creatinine Ratio 17 12 - 28   Sodium 141 134 - 144 mmol/L   Potassium 4.1 3.5 - 5.2 mmol/L   Chloride 99 96 - 106 mmol/L   CO2 22 20 - 29 mmol/L   Calcium  9.7 8.7 - 10.3 mg/dL   Total Protein 7.5 6.0 - 8.5 g/dL   Albumin 4.2 3.8 - 4.8 g/dL   Globulin, Total 3.3 1.5 - 4.5 g/dL   Bilirubin Total 0.3 0.0 - 1.2 mg/dL   Alkaline Phosphatase 144 (H) 44 - 121 IU/L   AST 19 0 - 40 IU/L   ALT 10 0 - 32 IU/L  Lipid Panel w/o Chol/HDL Ratio   Collection Time: 08/31/23 10:40 AM  Result Value Ref Range   Cholesterol, Total 210 (H) 100 - 199 mg/dL   Triglycerides 831 (H) 0 - 149 mg/dL   HDL 44 >60 mg/dL   VLDL Cholesterol Cal 30 5 - 40 mg/dL   LDL Chol Calc (NIH) 863 (H) 0 - 99 mg/dL      Assessment & Plan:   Problem List Items Addressed This Visit       Cardiovascular and Mediastinum   Hypertension - Primary   Chronic, stable.  BP initial elevated with machine, recheck at goal.  She has some white coat syndrome at baseline.  Recommend she monitor at least a few times a week at home.  Continue Losartan  25 MG daily and adjust as needed -- she did not tolerate increase in this dose to 50 MG in past, had dizziness.  Focus on DASH diet at home.  Labs today: up to date. Return to office in 3  months for BP check and reassurance.          Endocrine   Hashimoto's thyroiditis   Chronic, ongoing.  Continue Levothyroxine  daily and adjust dose as needed.  Labs up to date.        Other   Hyperlipidemia   Chronic, ongoing.  Continues every other day statin which has decreased side effects.  Lipid panel next visit.      Elevated hemoglobin A1c   A1c 5.6% today, trend down.  Continue diet focus at home.        Relevant Orders   Bayer DCA Hb A1c Waived   Other Visit Diagnoses       Flu vaccine need       Flu vaccine today, educated patient.   Relevant Orders   Flu vaccine HIGH DOSE PF(Fluzone Trivalent) (Completed)        Follow up plan: Return in about 3 months (around 03/03/2024) for HTN/HLD, Hypothyroid, PRediabetes.

## 2024-02-06 ENCOUNTER — Other Ambulatory Visit: Payer: Self-pay | Admitting: Nurse Practitioner

## 2024-02-07 ENCOUNTER — Other Ambulatory Visit: Payer: Self-pay | Admitting: Nurse Practitioner

## 2024-02-07 DIAGNOSIS — Z1231 Encounter for screening mammogram for malignant neoplasm of breast: Secondary | ICD-10-CM

## 2024-03-03 NOTE — Patient Instructions (Incomplete)
 Be Involved in Caring For Your Health:  Taking Medications When medications are taken as directed, they can greatly improve your health. But if they are not taken as prescribed, they may not work. In some cases, not taking them correctly can be harmful. To help ensure your treatment remains effective and safe, understand your medications and how to take them. Bring your medications to each visit for review by your provider.  Your lab results, notes, and after visit summary will be available on My Chart. We strongly encourage you to use this feature. If lab results are abnormal the clinic will contact you with the appropriate steps. If the clinic does not contact you assume the results are satisfactory. You can always view your results on My Chart. If you have questions regarding your health or results, please contact the clinic during office hours. You can also ask questions on My Chart.  We at [facility] are grateful that you chose us  to provide your care. We strive to provide evidence-based and compassionate care and are always looking for feedback. If you get a survey from the clinic please complete this so we can hear your opinions.  DASH Eating Plan DASH stands for Dietary Approaches to Stop Hypertension. The DASH eating plan is a healthy eating plan that has been shown to: Lower high blood pressure (hypertension). Reduce your risk for type 2 diabetes, heart disease, and stroke. Help with weight loss. What are tips for following this plan? Reading food labels Check food labels for the amount of salt (sodium) per serving. Choose foods with less than 5 percent of the Daily Value (DV) of sodium. In general, foods with less than 300 milligrams (mg) of sodium per serving fit into this eating plan. To find whole grains, look for the word whole as the first word in the ingredient list. Shopping Buy products labeled as low-sodium or no salt added. Buy fresh foods. Avoid canned  foods and pre-made or frozen meals. Cooking Try not to add salt when you cook. Use salt-free seasonings or herbs instead of table salt or sea salt. Check with your health care provider or pharmacist before using salt substitutes. Do not fry foods. Cook foods in healthy ways, such as baking, boiling, grilling, roasting, or broiling. Cook using oils that are good for your heart. These include olive, canola, avocado, soybean, and sunflower oil. Meal planning  Eat a balanced diet. This should include: 4 or more servings of fruits and 4 or more servings of vegetables each day. Try to fill half of your plate with fruits and vegetables. 6-8 servings of whole grains each day. 6 or less servings of lean meat, poultry, or fish each day. 1 oz is 1 serving. A 3 oz (85 g) serving of meat is about the same size as the palm of your hand. One egg is 1 oz (28 g). 2-3 servings of low-fat dairy each day. One serving is 1 cup (237 mL). 1 serving of nuts, seeds, or beans 5 times each week. 2-3 servings of heart-healthy fats. Healthy fats called omega-3 fatty acids are found in foods such as walnuts, flaxseeds, fortified milks, and eggs. These fats are also found in cold-water fish, such as sardines, salmon, and mackerel. Limit how much you eat of: Canned or prepackaged foods. Food that is high in trans fat, such as fried foods. Food that is high in saturated fat, such as fatty meat. Desserts and other sweets, sugary drinks, and other foods with added sugar. Full-fat  dairy products. Do not salt foods before eating. Do not eat more than 4 egg yolks a week. Try to eat at least 2 vegetarian meals a week. Eat more home-cooked food and less restaurant, buffet, and fast food. Lifestyle When eating at a restaurant, ask if your food can be made with less salt or no salt. If you drink alcohol: Limit how much you have to: 0-1 drink a day if you are female. 0-2 drinks a day if you are female. Know how much alcohol is in  your drink. In the [place] one drink is one 12 oz bottle of beer (355 mL), one 5 oz glass of wine (148 mL), or one 1 oz glass of hard liquor (44 mL). General information Avoid eating more than 2,300 mg of salt a day. If you have hypertension, you may need to reduce your sodium intake to 1,500 mg a day. Work with your provider to stay at a healthy body weight or lose weight. Ask what the best weight range is for you. On most days of the week, get at least 30 minutes of exercise that causes your heart to beat faster. This may include walking, swimming, or biking. Work with your provider or dietitian to adjust your eating plan to meet your specific calorie needs. What foods should I eat? Fruits All fresh, dried, or frozen fruit. Canned fruits that are in their natural juice and do not have sugar added to them. Vegetables Fresh or frozen vegetables that are raw, steamed, roasted, or grilled. Low-sodium or reduced-sodium tomato and vegetable juice. Low-sodium or reduced-sodium tomato sauce and tomato paste. Low-sodium or reduced-sodium canned vegetables. Grains Whole-grain or whole-wheat bread. Whole-grain or whole-wheat pasta. Brown rice. [place] Madeira. Bulgur. Whole-grain and low-sodium cereals. Pita bread. Low-fat, low-sodium crackers. Whole-wheat flour tortillas. Meats and other proteins Skinless chicken or [place]. Ground chicken or [place]. Pork with fat trimmed off. Fish and seafood. Egg whites. Dried beans, peas, or lentils. Unsalted nuts, nut butters, and seeds. Unsalted canned beans. Lean cuts of beef with fat trimmed off. Low-sodium, lean precooked or cured meat, such as sausages or meat loaves. Dairy Low-fat (1%) or fat-free (skim) milk. Reduced-fat, low-fat, or fat-free cheeses. Nonfat, low-sodium ricotta or cottage cheese. Low-fat or nonfat yogurt. Low-fat, low-sodium cheese. Fats and oils Soft margarine without trans fats. Vegetable oil. Reduced-fat, low-fat, or light mayonnaise and salad  dressings (reduced-sodium). Canola, safflower, olive, avocado, soybean, and sunflower oils. Avocado. Seasonings and condiments Herbs. Spices. Seasoning mixes without salt. Other foods Unsalted popcorn and pretzels. Fat-free sweets. The items listed above may not be all the foods and drinks you can have. Talk to a dietitian to learn more. What foods should I avoid? Fruits Canned fruit in a light or heavy syrup. Fried fruit. Fruit in cream or butter sauce. Vegetables Creamed or fried vegetables. Vegetables in a cheese sauce. Regular canned vegetables that are not marked as low-sodium or reduced-sodium. Regular canned tomato sauce and paste that are not marked as low-sodium or reduced-sodium. Regular tomato and vegetable juices that are not marked as low-sodium or reduced-sodium. Dene. Olives. Grains Baked goods made with fat, such as croissants, muffins, or some breads. Dry pasta or rice meal packs. Meats and other proteins Fatty cuts of meat. Ribs. Fried meat. [place]. Bologna, salami, and other precooked or cured meats, such as sausages or meat loaves, that are not lean and low in sodium. Fat from the back of a pig (fatback). Bratwurst. Salted nuts and seeds. Canned beans with added salt. Canned  or smoked fish. Whole eggs or egg yolks. Chicken or [place] with skin. Dairy Whole or 2% milk, cream, and half-and-half. Whole or full-fat cream cheese. Whole-fat or sweetened yogurt. Full-fat cheese. Nondairy creamers. Whipped toppings. Processed cheese and cheese spreads. Fats and oils Butter. Stick margarine. Lard. Shortening. Ghee. Bacon fat. Tropical oils, such as coconut, palm kernel, or palm oil. Seasonings and condiments Onion salt, garlic salt, seasoned salt, table salt, and sea salt. Worcestershire sauce. Tartar sauce. Barbecue sauce. Teriyaki sauce. Soy sauce, including reduced-sodium soy sauce. Steak sauce. Canned and packaged gravies. Fish sauce. Oyster sauce. Cocktail sauce. Store-bought  horseradish. Ketchup. Mustard. Meat flavorings and tenderizers. Bouillon cubes. Hot sauces. Pre-made or packaged marinades. Pre-made or packaged taco seasonings. Relishes. Regular salad dressings. Other foods Salted popcorn and pretzels. The items listed above may not be all the foods and drinks you should avoid. Talk to a dietitian to learn more. Where to find more information National Heart, Lung, and Blood Institute (NHLBI): BuffaloDryCleaner.gl American Heart Association (AHA): heart.org Academy of Nutrition and Dietetics: eatright.org National Kidney Foundation (NKF): kidney.org This information is not intended to replace advice given to you by your health care provider. Make sure you discuss any questions you have with your health care provider. Document Revised: 02/18/2022 Document Reviewed: 02/18/2022 Elsevier Patient Education  2024 [employer].

## 2024-03-06 ENCOUNTER — Encounter: Payer: Self-pay | Admitting: Nurse Practitioner

## 2024-03-06 ENCOUNTER — Ambulatory Visit: Admitting: Nurse Practitioner

## 2024-03-06 VITALS — BP 136/80 | HR 82 | Temp 97.3°F | Ht 62.0 in | Wt 160.8 lb

## 2024-03-06 DIAGNOSIS — I10 Essential (primary) hypertension: Secondary | ICD-10-CM

## 2024-03-06 DIAGNOSIS — M19042 Primary osteoarthritis, left hand: Secondary | ICD-10-CM | POA: Insufficient documentation

## 2024-03-06 DIAGNOSIS — R748 Abnormal levels of other serum enzymes: Secondary | ICD-10-CM

## 2024-03-06 DIAGNOSIS — R801 Persistent proteinuria, unspecified: Secondary | ICD-10-CM

## 2024-03-06 DIAGNOSIS — R7309 Other abnormal glucose: Secondary | ICD-10-CM

## 2024-03-06 DIAGNOSIS — M19041 Primary osteoarthritis, right hand: Secondary | ICD-10-CM | POA: Diagnosis not present

## 2024-03-06 DIAGNOSIS — E063 Autoimmune thyroiditis: Secondary | ICD-10-CM | POA: Diagnosis not present

## 2024-03-06 DIAGNOSIS — E782 Mixed hyperlipidemia: Secondary | ICD-10-CM | POA: Diagnosis not present

## 2024-03-06 LAB — MICROALBUMIN, URINE WAIVED
Creatinine, Urine Waived: 50 mg/dL (ref 10–300)
Microalb, Ur Waived: 30 mg/L — ABNORMAL HIGH (ref 0–19)

## 2024-03-06 MED ORDER — ASPIRIN 81 MG PO TBEC: 81.0000 mg | DELAYED_RELEASE_TABLET | Freq: Every day | ORAL | 3 refills | Status: AC

## 2024-03-06 MED ORDER — LEVOTHYROXINE SODIUM 25 MCG PO TABS: 25.0000 ug | ORAL_TABLET | Freq: Every day | ORAL | 2 refills | Status: DC

## 2024-03-06 MED ORDER — LOSARTAN POTASSIUM 50 MG PO TABS: 25.0000 mg | ORAL_TABLET | Freq: Every day | ORAL | 4 refills | Status: AC

## 2024-03-06 MED ORDER — VITAMIN B-12 1000 MCG SL SUBL: 1000.0000 ug | SUBLINGUAL_TABLET | Freq: Every day | SUBLINGUAL | 3 refills | Status: AC

## 2024-03-06 NOTE — Progress Notes (Signed)
 "  BP 136/80 (BP Location: Left Arm, Patient Position: Sitting, Cuff Size: Normal)   Pulse 82   Temp (!) 97.3 F (36.3 C) (Oral)   Ht 5' 2 (1.575 m)   Wt 160 lb 12.8 oz (72.9 kg)   LMP  (LMP Unknown)   SpO2 93%   BMI 29.41 kg/m    Subjective:    Patient ID: Carol [name], female    DOB: 1950/07/24, 74 y.o.   MRN: [id number]  HPI: Carol [name] is a 74 y.o. female  Chief Complaint  Patient presents with   Hyperlipidemia   Hypertension   Hypothyroidism   Prediabetes   Hand pain present to both hands, present for months. When cold it hurts more.   HYPERTENSION / HYPERLIPIDEMIA & PROTEINURIA Takes Losartan  25 MG (for HTN and proteinuria) and Atorvastatin .   Satisfied with current treatment? yes Duration of hypertension: chronic BP monitoring frequency: not checking BP range: BP medication side effects: no Duration of hyperlipidemia: chronic Cholesterol medication side effects: no Cholesterol supplements: none Medication compliance: good compliance Aspirin : yes Recent stressors: no Recurrent headaches: no Visual changes: no Palpitations: no Dyspnea: no Chest pain: no Lower extremity edema: no Dizzy/lightheaded: no  Impaired Fasting Glucose HbA1C:  Lab Results  Component Value Date   HGBA1C 5.6 12/02/2023  Duration of elevated blood sugar: years Polydipsia: no Polyuria: no Weight change: no Visual disturbance: no Glucose Monitoring: no    Accucheck frequency: Not Checking    Fasting glucose:     Post prandial:  Diabetic Education: Not Completed Family history of diabetes: no   HASHIMOTO'S THYROID  Continues on Levothyroxine  25 MCG daily. Thyroid  control status:stable Satisfied with current treatment? yes Medication side effects: no Medication compliance: fair compliance Etiology of hypothyroidism: Hashimoto's Recent dose adjustment:no Fatigue: no Cold intolerance: no Heat intolerance: no Weight gain: no Weight loss: no Constipation:  no Diarrhea/loose stools: no Palpitations: no Lower extremity edema: no Anxiety/depressed mood: no      12/02/2023   10:24 AM 09/13/2023   11:31 AM 08/31/2023   10:39 AM 06/02/2023    9:29 AM 03/02/2023    9:49 AM  Depression screen PHQ 2/9  Decreased Interest 0 0 0 0 0  Down, Depressed, Hopeless 0 0 0 0 0  PHQ - 2 Score 0 0 0 0 0  Altered sleeping 0 0 0 0 0  Tired, decreased energy 0 0 0 0 0  Change in appetite 0 0 0 0 0  Feeling bad or failure about yourself  0 0 0 0 0  Trouble concentrating 0 0 0 0 0  Moving slowly or fidgety/restless 0 0 0 0 0  Suicidal thoughts 0 0 0 0 0  PHQ-9 Score 0  0  0  0  0   Difficult doing work/chores Not difficult at all Not difficult at all Not difficult at all Not difficult at all Not difficult at all     Data saved with a previous flowsheet row definition       12/02/2023   10:24 AM 08/31/2023   10:39 AM 06/02/2023    9:29 AM 03/02/2023    9:49 AM  GAD 7 : Generalized Anxiety Score  Nervous, Anxious, on Edge 0  0  0  0   Control/stop worrying 0  0  0  0   Worry too much - different things 0  0  0  0   Trouble relaxing 0  0  0  0   Restless 0  0  0  0   Easily annoyed or irritable 0  0  0  0   Afraid - awful might happen 0  0  0  0   Total GAD 7 Score 0 0 0 0  Anxiety Difficulty Not difficult at all Not difficult at all Not difficult at all Not difficult at all     Data saved with a previous flowsheet row definition    Relevant past medical, surgical, family and social history reviewed and updated as indicated. Interim medical history since our last visit reviewed. Allergies and medications reviewed and updated.  Review of Systems  Constitutional:  Negative for activity change, appetite change, diaphoresis, fatigue and fever.  Respiratory:  Negative for cough, chest tightness and shortness of breath.   Cardiovascular:  Negative for chest pain, palpitations and leg swelling.  Gastrointestinal: Negative.   Neurological: Negative.    Psychiatric/Behavioral: Negative.     Per HPI unless specifically indicated above     Objective:    BP 136/80 (BP Location: Left Arm, Patient Position: Sitting, Cuff Size: Normal)   Pulse 82   Temp (!) 97.3 F (36.3 C) (Oral)   Ht 5' 2 (1.575 m)   Wt 160 lb 12.8 oz (72.9 kg)   LMP  (LMP Unknown)   SpO2 93%   BMI 29.41 kg/m   Wt Readings from Last 3 Encounters:  03/06/24 160 lb 12.8 oz (72.9 kg)  12/02/23 158 lb 3.2 oz (71.8 kg)  09/13/23 156 lb (70.8 kg)    Physical Exam Vitals and nursing note reviewed.  Constitutional:      General: She is awake. She is not in acute distress.    Appearance: She is well-developed and overweight. She is not [name]-appearing.  HENT:     Head: Normocephalic.     Right Ear: Hearing, tympanic membrane, ear canal and external ear normal.     Left Ear: Hearing, tympanic membrane, ear canal and external ear normal.  Eyes:     General: Lids are normal.        Right eye: No discharge.        Left eye: No discharge.     Conjunctiva/sclera: Conjunctivae normal.     Pupils: Pupils are equal, round, and reactive to light.  Neck:     Thyroid : Thyromegaly present.     Vascular: No carotid bruit.  Cardiovascular:     Rate and Rhythm: Normal rate and regular rhythm.     Heart sounds: Normal heart sounds. No murmur heard.    No gallop.     Comments: A few extra beats noted during auscultation, she was noted to have PACs on past EKGs. Overall rhythm is regular. Pulmonary:     Effort: Pulmonary effort is normal. No accessory muscle usage or respiratory distress.     Breath sounds: Normal breath sounds.  Abdominal:     General: Bowel sounds are normal.     Palpations: Abdomen is soft.  Musculoskeletal:     Right hand: Deformity (index finger slight curvature laterally and nodule to under thumb area) present. No swelling or tenderness. Normal range of motion. Normal strength. Normal sensation. Normal pulse.     Left hand: Deformity (nodule under thumb  area.) present. No swelling or tenderness. Normal range of motion. Normal strength. Normal sensation. Normal pulse.     Cervical back: Normal range of motion and neck supple.     Right lower leg: No edema.     Left lower leg: No  edema.  Lymphadenopathy:     Head:     Right side of head: No submental, submandibular, tonsillar, preauricular or posterior auricular adenopathy.     Left side of head: No submental, submandibular, tonsillar, preauricular or posterior auricular adenopathy.     Cervical: No cervical adenopathy.  Skin:    General: Skin is warm and dry.  Neurological:     Mental Status: She is alert and oriented to person, place, and time.  Psychiatric:        Attention and Perception: Attention normal.        Mood and Affect: Mood normal.        Speech: Speech normal.        Behavior: Behavior normal. Behavior is cooperative.        Thought Content: Thought content normal.    Results for orders placed or performed in visit on 12/02/23  Bayer DCA Hb A1c Waived   Collection Time: 12/02/23 10:01 AM  Result Value Ref Range   HB A1C (BAYER DCA - WAIVED) 5.6 4.8 - 5.6 %      Assessment & Plan:   Problem List Items Addressed This Visit       Cardiovascular and Mediastinum   Hypertension - Primary   Chronic, stable.  BP initial elevated with machine, recheck at goal.  She has some white coat syndrome at baseline with initial BP often above goal.  Recommend she monitor at least a few times a week at home.  Continue Losartan  25 MG daily and adjust as needed -- she did not tolerate increase in this dose to 50 MG in past, had dizziness.  Focus on DASH diet at home.  Labs today: CBC, CMP, urine ALB. Return to office in 3 months for BP check and reassurance. Urine ALB 16 March 2024.      Relevant Medications   losartan  (COZAAR ) 50 MG tablet   aspirin  EC 81 MG tablet   Other Relevant Orders   Microalbumin, Urine Waived   CBC with Differential/Platelet     Endocrine    Hashimoto's thyroiditis   Chronic, ongoing.  Continue Levothyroxine  daily and adjust dose as needed.  Labs obtained today.      Relevant Medications   levothyroxine  (SYNTHROID ) 25 MCG tablet   Other Relevant Orders   T4, free   TSH     Musculoskeletal and Integument   Arthritis of both hands   With some nodules and finger curvature. She reports pain is mild. She will trial arthritis gloves at home and a hand held warmer to keep joints warm in winter. Tylenol  as needed for pain and Voltaren  gel. Recommend she stretch often during the day. If worsening consider imaging and ortho.      Relevant Medications   aspirin  EC 81 MG tablet     Other   Proteinuria   Ongoing with stable kidney blood work.  Continue Losartan  for kidney protection, urine ALB 16 March 2024, improved from previous.  Check CMP.  Consider kidney ultrasound in future, she refuses today.      Relevant Orders   Microalbumin, Urine Waived   Hyperlipidemia   Chronic, ongoing.  Continues every other day statin which has decreased side effects.  Lipid panel today.      Relevant Medications   losartan  (COZAAR ) 50 MG tablet   aspirin  EC 81 MG tablet   Other Relevant Orders   Comprehensive metabolic panel with GFR   Lipid Panel w/o Chol/HDL Ratio   Elevated hemoglobin A1c  A1c 5.6% recent labs, trend down.  Continue diet focus at home.        Alkaline phosphatase elevation   Recheck CMP and GGT today -- monitor.      Relevant Orders   Comprehensive metabolic panel with GFR   Gamma GT     Follow up plan: Return in about 3 months (around 06/04/2024) for HTN/HLD, Hypothyroid, IFG.  "

## 2024-03-06 NOTE — Assessment & Plan Note (Addendum)
Chronic, ongoing.  Continues every other day statin which has decreased side effects.  Lipid panel today.

## 2024-03-06 NOTE — Assessment & Plan Note (Signed)
 Chronic, stable.  BP initial elevated with machine, recheck at goal.  She has some white coat syndrome at baseline with initial BP often above goal.  Recommend she monitor at least a few times a week at home.  Continue Losartan  25 MG daily and adjust as needed -- she did not tolerate increase in this dose to 50 MG in past, had dizziness.  Focus on DASH diet at home.  Labs today: CBC, CMP, urine ALB. Return to office in 3 months for BP check and reassurance. Urine ALB 16 March 2024.

## 2024-03-06 NOTE — Assessment & Plan Note (Signed)
 With some nodules and finger curvature. She reports pain is mild. She will trial arthritis gloves at home and a hand held warmer to keep joints warm in winter. Tylenol  as needed for pain and Voltaren  gel. Recommend she stretch often during the day. If worsening consider imaging and ortho.

## 2024-03-06 NOTE — Assessment & Plan Note (Signed)
Recheck CMP and GGT today -- monitor. 

## 2024-03-06 NOTE — Assessment & Plan Note (Signed)
 A1c 5.6% recent labs, trend down.  Continue diet focus at home.

## 2024-03-06 NOTE — Assessment & Plan Note (Signed)
 Chronic, ongoing.  Continue Levothyroxine  daily and adjust dose as needed.  Labs obtained today.

## 2024-03-06 NOTE — Assessment & Plan Note (Signed)
 Ongoing with stable kidney blood work.  Continue Losartan  for kidney protection, urine ALB 16 March 2024, improved from previous.  Check CMP.  Consider kidney ultrasound in future, she refuses today.

## 2024-03-07 ENCOUNTER — Ambulatory Visit: Payer: Self-pay | Admitting: Nurse Practitioner

## 2024-03-07 ENCOUNTER — Ambulatory Visit
Admission: RE | Admit: 2024-03-07 | Discharge: 2024-03-07 | Disposition: A | Discharge status: Home / Self Care | Source: Ambulatory Visit | Attending: Nurse Practitioner | Admitting: Nurse Practitioner

## 2024-03-07 DIAGNOSIS — Z1231 Encounter for screening mammogram for malignant neoplasm of breast: Secondary | ICD-10-CM | POA: Insufficient documentation

## 2024-03-07 DIAGNOSIS — E063 Autoimmune thyroiditis: Secondary | ICD-10-CM

## 2024-03-07 LAB — CBC WITH DIFFERENTIAL/PLATELET
Basophils Absolute: 0 x10E3/uL (ref 0.0–0.2)
Basos: 1 %
EOS (ABSOLUTE): 0.1 x10E3/uL (ref 0.0–0.4)
Eos: 2 %
Hematocrit: 40.9 % (ref 34.0–46.6)
Hemoglobin: 12.9 g/dL (ref 11.1–15.9)
Immature Grans (Abs): 0 x10E3/uL (ref 0.0–0.1)
Immature Granulocytes: 0 %
Lymphocytes Absolute: 1.7 x10E3/uL (ref 0.7–3.1)
Lymphs: 39 %
MCH: 28.6 pg (ref 26.6–33.0)
MCHC: 31.5 g/dL (ref 31.5–35.7)
MCV: 91 fL (ref 79–97)
Monocytes Absolute: 0.6 x10E3/uL (ref 0.1–0.9)
Monocytes: 13 %
Neutrophils Absolute: 1.9 x10E3/uL (ref 1.4–7.0)
Neutrophils: 44 %
Platelets: 486 x10E3/uL — ABNORMAL HIGH (ref 150–450)
RBC: 4.51 x10E6/uL (ref 3.77–5.28)
RDW: 13.1 % (ref 11.7–15.4)
WBC: 4.3 x10E3/uL (ref 3.4–10.8)

## 2024-03-07 LAB — COMPREHENSIVE METABOLIC PANEL WITH GFR
ALT: 11 IU/L (ref 0–32)
AST: 17 IU/L (ref 0–40)
Albumin: 4.2 g/dL (ref 3.8–4.8)
Alkaline Phosphatase: 153 IU/L — ABNORMAL HIGH (ref 49–135)
BUN/Creatinine Ratio: 14 (ref 12–28)
BUN: 12 mg/dL (ref 8–27)
Bilirubin Total: 0.4 mg/dL (ref 0.0–1.2)
CO2: 23 mmol/L (ref 20–29)
Calcium: 10 mg/dL (ref 8.7–10.3)
Chloride: 99 mmol/L (ref 96–106)
Creatinine, Ser: 0.85 mg/dL (ref 0.57–1.00)
Globulin, Total: 3.4 g/dL (ref 1.5–4.5)
Glucose: 91 mg/dL (ref 70–99)
Potassium: 4.4 mmol/L (ref 3.5–5.2)
Sodium: 135 mmol/L (ref 134–144)
Total Protein: 7.6 g/dL (ref 6.0–8.5)
eGFR: 73 mL/min/1.73

## 2024-03-07 LAB — T4, FREE: Free T4: 1 ng/dL (ref 0.82–1.77)

## 2024-03-07 LAB — LIPID PANEL W/O CHOL/HDL RATIO
Cholesterol, Total: 183 mg/dL (ref 100–199)
HDL: 47 mg/dL
LDL Chol Calc (NIH): 114 mg/dL — ABNORMAL HIGH (ref 0–99)
Triglycerides: 124 mg/dL (ref 0–149)
VLDL Cholesterol Cal: 22 mg/dL (ref 5–40)

## 2024-03-07 LAB — TSH: TSH: 6.08 u[IU]/mL — ABNORMAL HIGH (ref 0.450–4.500)

## 2024-03-07 LAB — GAMMA GT: GGT: 20 IU/L (ref 0–60)

## 2024-03-07 NOTE — Progress Notes (Signed)
 Good morning, please reach out to Carol [name] and let her know her labs have returned: - Overall blood counts remain stable with exception of ongoing mild elevation in platelets. I recommend she take a Baby ASA 81 MG daily if not already taking. If continue to be elevated I may get her into hematology, but this has been present for many years -- since before 2019. - Kidney and liver function are stable. - Lipid panel continues to show mild elevation in LDL, continue statin therapy as ordered. - TSH was elevated this check, but Free T4 normal. Is she taking her Levothyroxine  every morning, 30 minutes before eating or taking other medications? If so I recommend we increase Levothyroxine  dose just a little, but if she is not taking it this way I recommend she start and we will recheck next visit. Any questions? Keep being stellar!!  Thank you for allowing me to participate in your care.  I appreciate you. Kindest regards, Brendi Mccarroll

## 2024-03-08 MED ORDER — LEVOTHYROXINE SODIUM 50 MCG PO TABS: 50.0000 ug | ORAL_TABLET | Freq: Every day | ORAL | 3 refills | Status: AC

## 2024-03-13 ENCOUNTER — Ambulatory Visit: Payer: Self-pay | Admitting: Nurse Practitioner

## 2024-03-13 NOTE — Progress Notes (Signed)
 Contacted via MyChart   Normal mammogram, may repeat in one year:)

## 2024-04-13 ENCOUNTER — Other Ambulatory Visit

## 2024-06-04 ENCOUNTER — Ambulatory Visit: Admitting: Nurse Practitioner

## 2024-09-18 ENCOUNTER — Ambulatory Visit
